# Patient Record
Sex: Male | Born: 1945 | Race: White | Hispanic: No | Marital: Married | Smoking: Never smoker
Health system: Southern US, Community
[De-identification: ages and names within clinical notes are randomized; demographics above are authoritative.]

## PROBLEM LIST (undated history)

## (undated) DIAGNOSIS — E785 Hyperlipidemia, unspecified: Secondary | ICD-10-CM

## (undated) DIAGNOSIS — N529 Male erectile dysfunction, unspecified: Secondary | ICD-10-CM

## (undated) DIAGNOSIS — E119 Type 2 diabetes mellitus without complications: Secondary | ICD-10-CM

## (undated) DIAGNOSIS — R296 Repeated falls: Secondary | ICD-10-CM

## (undated) DIAGNOSIS — I5189 Other ill-defined heart diseases: Secondary | ICD-10-CM

## (undated) DIAGNOSIS — M1990 Unspecified osteoarthritis, unspecified site: Secondary | ICD-10-CM

## (undated) DIAGNOSIS — Z972 Presence of dental prosthetic device (complete) (partial): Secondary | ICD-10-CM

## (undated) DIAGNOSIS — Z7982 Long term (current) use of aspirin: Secondary | ICD-10-CM

## (undated) DIAGNOSIS — M47816 Spondylosis without myelopathy or radiculopathy, lumbar region: Secondary | ICD-10-CM

## (undated) DIAGNOSIS — R202 Paresthesia of skin: Secondary | ICD-10-CM

## (undated) DIAGNOSIS — F419 Anxiety disorder, unspecified: Secondary | ICD-10-CM

## (undated) DIAGNOSIS — K219 Gastro-esophageal reflux disease without esophagitis: Secondary | ICD-10-CM

## (undated) DIAGNOSIS — Z860100 Personal history of colon polyps, unspecified: Secondary | ICD-10-CM

## (undated) DIAGNOSIS — I200 Unstable angina: Secondary | ICD-10-CM

## (undated) DIAGNOSIS — C61 Malignant neoplasm of prostate: Secondary | ICD-10-CM

## (undated) DIAGNOSIS — E1129 Type 2 diabetes mellitus with other diabetic kidney complication: Secondary | ICD-10-CM

## (undated) DIAGNOSIS — M5030 Other cervical disc degeneration, unspecified cervical region: Secondary | ICD-10-CM

## (undated) DIAGNOSIS — G8929 Other chronic pain: Secondary | ICD-10-CM

## (undated) DIAGNOSIS — E1140 Type 2 diabetes mellitus with diabetic neuropathy, unspecified: Secondary | ICD-10-CM

## (undated) DIAGNOSIS — E1142 Type 2 diabetes mellitus with diabetic polyneuropathy: Secondary | ICD-10-CM

## (undated) DIAGNOSIS — N401 Enlarged prostate with lower urinary tract symptoms: Secondary | ICD-10-CM

## (undated) DIAGNOSIS — M48062 Spinal stenosis, lumbar region with neurogenic claudication: Secondary | ICD-10-CM

## (undated) DIAGNOSIS — D509 Iron deficiency anemia, unspecified: Secondary | ICD-10-CM

## (undated) DIAGNOSIS — Z794 Long term (current) use of insulin: Secondary | ICD-10-CM

## (undated) DIAGNOSIS — G4700 Insomnia, unspecified: Secondary | ICD-10-CM

## (undated) DIAGNOSIS — Z973 Presence of spectacles and contact lenses: Secondary | ICD-10-CM

## (undated) DIAGNOSIS — M51369 Other intervertebral disc degeneration, lumbar region without mention of lumbar back pain or lower extremity pain: Secondary | ICD-10-CM

## (undated) DIAGNOSIS — Z77098 Contact with and (suspected) exposure to other hazardous, chiefly nonmedicinal, chemicals: Secondary | ICD-10-CM

## (undated) DIAGNOSIS — I2510 Atherosclerotic heart disease of native coronary artery without angina pectoris: Secondary | ICD-10-CM

## (undated) DIAGNOSIS — I10 Essential (primary) hypertension: Secondary | ICD-10-CM

## (undated) DIAGNOSIS — M5136 Other intervertebral disc degeneration, lumbar region: Secondary | ICD-10-CM

## (undated) HISTORY — DX: Other ill-defined heart diseases: I51.89

## (undated) HISTORY — PX: PROSTATE BIOPSY: SHX241

## (undated) HISTORY — PX: SHOULDER ARTHROSCOPY: SHX128

---

## 2008-04-22 ENCOUNTER — Ambulatory Visit: Payer: Self-pay | Admitting: Unknown Physician Specialty

## 2013-08-08 DIAGNOSIS — E1129 Type 2 diabetes mellitus with other diabetic kidney complication: Secondary | ICD-10-CM | POA: Insufficient documentation

## 2013-11-01 ENCOUNTER — Ambulatory Visit: Payer: Self-pay | Admitting: Unknown Physician Specialty

## 2013-11-03 LAB — PATHOLOGY REPORT

## 2014-03-13 DIAGNOSIS — I10 Essential (primary) hypertension: Secondary | ICD-10-CM | POA: Diagnosis not present

## 2014-03-13 DIAGNOSIS — E1129 Type 2 diabetes mellitus with other diabetic kidney complication: Secondary | ICD-10-CM | POA: Diagnosis not present

## 2014-03-13 DIAGNOSIS — E785 Hyperlipidemia, unspecified: Secondary | ICD-10-CM | POA: Diagnosis not present

## 2014-03-13 DIAGNOSIS — E538 Deficiency of other specified B group vitamins: Secondary | ICD-10-CM | POA: Diagnosis not present

## 2014-03-20 DIAGNOSIS — E1129 Type 2 diabetes mellitus with other diabetic kidney complication: Secondary | ICD-10-CM | POA: Diagnosis not present

## 2014-03-20 DIAGNOSIS — E782 Mixed hyperlipidemia: Secondary | ICD-10-CM | POA: Diagnosis not present

## 2014-03-20 DIAGNOSIS — I10 Essential (primary) hypertension: Secondary | ICD-10-CM | POA: Diagnosis not present

## 2014-09-18 DIAGNOSIS — E782 Mixed hyperlipidemia: Secondary | ICD-10-CM | POA: Diagnosis not present

## 2014-09-18 DIAGNOSIS — Z125 Encounter for screening for malignant neoplasm of prostate: Secondary | ICD-10-CM | POA: Diagnosis not present

## 2014-09-18 DIAGNOSIS — I10 Essential (primary) hypertension: Secondary | ICD-10-CM | POA: Diagnosis not present

## 2014-09-18 DIAGNOSIS — E1129 Type 2 diabetes mellitus with other diabetic kidney complication: Secondary | ICD-10-CM | POA: Diagnosis not present

## 2014-09-24 DIAGNOSIS — R809 Proteinuria, unspecified: Secondary | ICD-10-CM | POA: Diagnosis not present

## 2014-09-24 DIAGNOSIS — Z0000 Encounter for general adult medical examination without abnormal findings: Secondary | ICD-10-CM | POA: Diagnosis not present

## 2014-09-24 DIAGNOSIS — E1129 Type 2 diabetes mellitus with other diabetic kidney complication: Secondary | ICD-10-CM | POA: Diagnosis not present

## 2014-09-24 DIAGNOSIS — E782 Mixed hyperlipidemia: Secondary | ICD-10-CM | POA: Diagnosis not present

## 2014-10-02 ENCOUNTER — Ambulatory Visit: Payer: Self-pay | Admitting: *Deleted

## 2014-11-18 DIAGNOSIS — E1129 Type 2 diabetes mellitus with other diabetic kidney complication: Secondary | ICD-10-CM | POA: Diagnosis not present

## 2014-11-18 DIAGNOSIS — R809 Proteinuria, unspecified: Secondary | ICD-10-CM | POA: Diagnosis not present

## 2014-11-18 DIAGNOSIS — Z794 Long term (current) use of insulin: Secondary | ICD-10-CM | POA: Diagnosis not present

## 2014-11-25 DIAGNOSIS — R809 Proteinuria, unspecified: Secondary | ICD-10-CM | POA: Diagnosis not present

## 2014-11-25 DIAGNOSIS — I10 Essential (primary) hypertension: Secondary | ICD-10-CM | POA: Diagnosis not present

## 2014-11-25 DIAGNOSIS — E1129 Type 2 diabetes mellitus with other diabetic kidney complication: Secondary | ICD-10-CM | POA: Diagnosis not present

## 2014-11-25 DIAGNOSIS — M5430 Sciatica, unspecified side: Secondary | ICD-10-CM | POA: Diagnosis not present

## 2014-11-25 DIAGNOSIS — Z794 Long term (current) use of insulin: Secondary | ICD-10-CM | POA: Diagnosis not present

## 2015-03-13 DIAGNOSIS — F324 Major depressive disorder, single episode, in partial remission: Secondary | ICD-10-CM | POA: Diagnosis not present

## 2015-03-13 DIAGNOSIS — E1140 Type 2 diabetes mellitus with diabetic neuropathy, unspecified: Secondary | ICD-10-CM | POA: Insufficient documentation

## 2015-03-25 DIAGNOSIS — E119 Type 2 diabetes mellitus without complications: Secondary | ICD-10-CM | POA: Diagnosis not present

## 2015-04-01 DIAGNOSIS — H532 Diplopia: Secondary | ICD-10-CM | POA: Diagnosis not present

## 2015-04-09 DIAGNOSIS — H4921 Sixth [abducent] nerve palsy, right eye: Secondary | ICD-10-CM | POA: Diagnosis not present

## 2015-04-25 DIAGNOSIS — R809 Proteinuria, unspecified: Secondary | ICD-10-CM | POA: Diagnosis not present

## 2015-04-25 DIAGNOSIS — E1129 Type 2 diabetes mellitus with other diabetic kidney complication: Secondary | ICD-10-CM | POA: Diagnosis not present

## 2015-04-25 DIAGNOSIS — Z1159 Encounter for screening for other viral diseases: Secondary | ICD-10-CM | POA: Diagnosis not present

## 2015-04-25 DIAGNOSIS — Z794 Long term (current) use of insulin: Secondary | ICD-10-CM | POA: Diagnosis not present

## 2015-05-02 DIAGNOSIS — G5701 Lesion of sciatic nerve, right lower limb: Secondary | ICD-10-CM | POA: Diagnosis not present

## 2015-05-02 DIAGNOSIS — R809 Proteinuria, unspecified: Secondary | ICD-10-CM | POA: Diagnosis not present

## 2015-05-02 DIAGNOSIS — M47816 Spondylosis without myelopathy or radiculopathy, lumbar region: Secondary | ICD-10-CM | POA: Diagnosis not present

## 2015-05-02 DIAGNOSIS — Z794 Long term (current) use of insulin: Secondary | ICD-10-CM | POA: Diagnosis not present

## 2015-05-02 DIAGNOSIS — E782 Mixed hyperlipidemia: Secondary | ICD-10-CM | POA: Diagnosis not present

## 2015-05-02 DIAGNOSIS — E1129 Type 2 diabetes mellitus with other diabetic kidney complication: Secondary | ICD-10-CM | POA: Diagnosis not present

## 2015-11-25 DIAGNOSIS — R809 Proteinuria, unspecified: Secondary | ICD-10-CM | POA: Diagnosis not present

## 2015-11-25 DIAGNOSIS — Z125 Encounter for screening for malignant neoplasm of prostate: Secondary | ICD-10-CM | POA: Diagnosis not present

## 2015-11-25 DIAGNOSIS — E1129 Type 2 diabetes mellitus with other diabetic kidney complication: Secondary | ICD-10-CM | POA: Diagnosis not present

## 2015-11-25 DIAGNOSIS — Z794 Long term (current) use of insulin: Secondary | ICD-10-CM | POA: Diagnosis not present

## 2015-12-02 DIAGNOSIS — Z794 Long term (current) use of insulin: Secondary | ICD-10-CM | POA: Diagnosis not present

## 2015-12-02 DIAGNOSIS — E1121 Type 2 diabetes mellitus with diabetic nephropathy: Secondary | ICD-10-CM | POA: Diagnosis not present

## 2015-12-02 DIAGNOSIS — E1140 Type 2 diabetes mellitus with diabetic neuropathy, unspecified: Secondary | ICD-10-CM | POA: Diagnosis not present

## 2015-12-02 DIAGNOSIS — Z0000 Encounter for general adult medical examination without abnormal findings: Secondary | ICD-10-CM | POA: Diagnosis not present

## 2015-12-02 DIAGNOSIS — I10 Essential (primary) hypertension: Secondary | ICD-10-CM | POA: Insufficient documentation

## 2018-02-21 DIAGNOSIS — C61 Malignant neoplasm of prostate: Secondary | ICD-10-CM

## 2018-02-21 HISTORY — DX: Malignant neoplasm of prostate: C61

## 2018-03-08 ENCOUNTER — Encounter: Payer: Self-pay | Admitting: *Deleted

## 2018-04-06 ENCOUNTER — Encounter: Payer: Self-pay | Admitting: Radiation Oncology

## 2018-04-06 NOTE — Progress Notes (Addendum)
GU Location of Tumor / Histology: prostatic adenocarcinoma   If Prostate Cancer, Gleason Score is (3 + 4) and PSA is (7.52). Prostate volume: 46 cc.   NESHAWN AIRD was found to have a PSA that was slightly elevated in 01/2017 at 4.63. When repeated on 11/24/2017 it had risen to 7.52. Patient presented to Dr. Karsten Ro for further evaluation of an elevated PSA 01/2018.  Biopsies of prostate (if applicable) revealed:    Past/Anticipated interventions by urology, if any: prostate biopsy, referral to radiation oncology for consideration of radioactive seeds  Past/Anticipated interventions by medical oncology, if any: no  Weight changes, if any: no  Bowel/Bladder complaints, if any: IPSS 17. SHIM 7.  Reports nocturia x 2.  Denies dysuria or hematuria. Reports occasional leakage related to urinary urgency.   Nausea/Vomiting, if any: no  Pain issues, if any:  Low back that radiates down right leg. Hx of degenerative disc disease.   SAFETY ISSUES:  Prior radiation? no  Pacemaker/ICD? no  Possible current pregnancy? no, male patient  Is the patient on methotrexate? no  Current Complaints / other details:  73 year old male. Married. NKDA. NO family hx of prostate ca. Reports being a [place] vet and exposed to agent orange.

## 2018-04-07 ENCOUNTER — Encounter: Payer: Self-pay | Admitting: Radiation Oncology

## 2018-04-07 ENCOUNTER — Encounter: Payer: Self-pay | Admitting: Medical Oncology

## 2018-04-07 ENCOUNTER — Ambulatory Visit
Admission: RE | Admit: 2018-04-07 | Discharge: 2018-04-07 | Disposition: A | Discharge status: Home / Self Care | Payer: Non-veteran care | Source: Ambulatory Visit | Attending: Radiation Oncology | Admitting: Radiation Oncology

## 2018-04-07 ENCOUNTER — Other Ambulatory Visit: Payer: Self-pay

## 2018-04-07 VITALS — BP 142/83 | HR 89 | Temp 97.6°F | Resp 20 | Wt 215.2 lb

## 2018-04-07 DIAGNOSIS — C61 Malignant neoplasm of prostate: Secondary | ICD-10-CM

## 2018-04-07 DIAGNOSIS — Z79899 Other long term (current) drug therapy: Secondary | ICD-10-CM | POA: Insufficient documentation

## 2018-04-07 DIAGNOSIS — Z7982 Long term (current) use of aspirin: Secondary | ICD-10-CM | POA: Insufficient documentation

## 2018-04-07 DIAGNOSIS — Z86010 Personal history of colonic polyps: Secondary | ICD-10-CM | POA: Insufficient documentation

## 2018-04-07 DIAGNOSIS — K219 Gastro-esophageal reflux disease without esophagitis: Secondary | ICD-10-CM | POA: Insufficient documentation

## 2018-04-07 HISTORY — DX: Malignant neoplasm of prostate: C61

## 2018-04-07 NOTE — Progress Notes (Signed)
Radiation Oncology         [phone] ________________________________  Initial outpatient Consultation  Name: Cristian Martin MRN: [id number]  Date: 04/07/2018  DOB: April 24, 1945  PF:[id number], Reece Leader, MD  Kathie Rhodes, MD   REFERRING PHYSICIAN: Kathie Rhodes, MD  DIAGNOSIS: 73 y.o. gentleman with Stage T1c adenocarcinoma of the prostate with Gleason score of 3+4, and PSA of 7.52.    ICD-10-CM   1. Malignant neoplasm of prostate [facility]) C61 Ambulatory referral to Social Work    HISTORY OF PRESENT ILLNESS: Cristian Martin is a 73 y.o. male with a diagnosis of prostate cancer. He was noted to have an elevated PSA of 7.52 by his primary care physician at the [place].  Accordingly, he was referred for evaluation in urology by Dr. Karsten Ro on 01/31/2018,  digital rectal examination was performed at that time revealing no abnormalities.  The patient proceeded to transrectal ultrasound with 12 biopsies of the prostate on 02/21/2018.  The prostate volume measured 46 cc.  Out of 12 core biopsies, 3 were positive.  The maximum Gleason score was 3+4, and this was seen in left mid lateral. Gleason 3+3 was also seen in left base lateral and right apex lateral.   The patient reviewed the biopsy results with his urologist and he has kindly been referred today for discussion of potential radiation treatment options.  Of note, this is a [place] veteran with a history of agent orange exposure.   PREVIOUS RADIATION THERAPY: No  PAST MEDICAL HISTORY:  Past Medical History:  Diagnosis Date  . Prostate cancer ([place])       PAST SURGICAL HISTORY: Past Surgical History:  Procedure Laterality Date  . PROSTATE BIOPSY      FAMILY HISTORY:  Family History  Problem Relation Age of Onset  . Cancer Neg Hx     SOCIAL HISTORY:  Social History   Socioeconomic History  . Marital status: Married    Spouse name: Not on file  . Number of children: Not on file  . Years of education: Not on file  . Highest education  level: Not on file  Occupational History  . Not on file  Social Needs  . Financial resource strain: Not on file  . Food insecurity:    Worry: Not on file    Inability: Not on file  . Transportation needs:    Medical: Not on file    Non-medical: Not on file  Tobacco Use  . Smoking status: Never Smoker  . Smokeless tobacco: Never Used  Substance and Sexual Activity  . Alcohol use: Not Currently  . Drug use: Never  . Sexual activity: Not Currently  Lifestyle  . Physical activity:    Days per week: Not on file    Minutes per session: Not on file  . Stress: Not on file  Relationships  . Social connections:    Talks on phone: Not on file    Gets together: Not on file    Attends religious service: Not on file    Active member of club or organization: Not on file    Attends meetings of clubs or organizations: Not on file    Relationship status: Not on file  . Intimate partner violence:    Fear of current or ex partner: Not on file    Emotionally abused: Not on file    Physically abused: Not on file    Forced sexual activity: Not on file  Other Topics Concern  . Not on file  Social History Narrative  . Not on file    ALLERGIES: Ace inhibitors  MEDICATIONS:  Current Outpatient Medications  Medication Sig Dispense Refill  . amLODipine (NORVASC) 10 MG tablet Take 10 mg by mouth daily.    Marland Kitchen aspirin EC 81 MG tablet Take 81 mg by mouth daily.    . cloNIDine (CATAPRES - DOSED IN MG/24 HR) 0.3 mg/24hr patch Place 0.3 mg onto the skin once a week.    . cloNIDine (CATAPRES) 0.3 MG tablet TAKE 1 TABLET BY MOUTH THREE TIMES DAILY    . doxazosin (CARDURA) 4 MG tablet Take 4 mg by mouth daily.    . DULoxetine (CYMBALTA) 60 MG capsule Take 60 mg by mouth daily.    . Insulin Aspart Prot & Aspart (NOVOLOG MIX 70/30 FLEXPEN [place]) Inject into the skin.    Marland Kitchen insulin detemir (LEVEMIR) 100 UNIT/ML injection Inject into the skin.    Marland Kitchen losartan (COZAAR) 100 MG tablet Take 100 mg by mouth daily.     . metFORMIN (GLUCOPHAGE) 500 MG tablet Take by mouth 2 (two) times daily with a meal.    . Multiple Vitamins-Minerals (MULTIVITAMIN ADULT PO) Take by mouth.    Marland Kitchen omeprazole (PRILOSEC) 20 MG capsule Take 20 mg by mouth daily.    . rosuvastatin (CRESTOR) 20 MG tablet Take 20 mg by mouth daily.    . Semaglutide (OZEMPIC, 1 MG/DOSE, [place]) Inject into the skin.    Marland Kitchen simvastatin (ZOCOR) 40 MG tablet TAKE 1/2 TABLET BY MOUTH NIGHTLY     No current facility-administered medications for this encounter.     REVIEW OF SYSTEMS:  On review of systems, the patient reports that he is doing well overall. He denies any chest pain, shortness of breath, cough, fevers, chills, night sweats, unintended weight changes. He denies any bowel disturbances, and denies abdominal pain, nausea or vomiting. He reports low back pain that radiates down his right leg, with a history of degenerative disc disease. His IPSS was 17, indicating moderate urinary symptoms. He reports nocturia x2 and occasional leakage related to urinary urgency. He denies dysuria or hematuria. His SHIM was 7, indicating he has severe erectile dysfunction. A complete review of systems is obtained and is otherwise negative.    PHYSICAL EXAM:  Wt Readings from Last 3 Encounters:  04/07/18 215 lb 3.2 oz (97.6 kg)   Temp Readings from Last 3 Encounters:  04/07/18 97.6 F (36.4 C) (Oral)   BP Readings from Last 3 Encounters:  04/07/18 (!) 142/83   Pulse Readings from Last 3 Encounters:  04/07/18 89   Pain Assessment Pain Score: 0-No pain/10  In general this is a well appearing Caucasian gentleman in no acute distress. He is alert and oriented x4 and appropriate throughout the examination. HEENT reveals that the patient is normocephalic, atraumatic.   KPS = 90  100 - Normal; no complaints; no evidence of disease. 90   - Able to carry on normal activity; minor signs or symptoms of disease. 80   - Normal activity with effort; some signs or  symptoms of disease. 18   - Cares for self; unable to carry on normal activity or to do active work. 60   - Requires occasional assistance, but is able to care for most of his personal needs. 50   - Requires considerable assistance and frequent medical care. 35   - Disabled; requires special care and assistance. 50   - Severely disabled; hospital admission is indicated although death not imminent. 20   -  Very sick; hospital admission necessary; active supportive treatment necessary. 10   - Moribund; fatal processes progressing rapidly. 0     - Dead  Karnofsky DA, Abelmann WH, Craver LS and Burchenal JH [phone]) The use of the nitrogen mustards in the palliative treatment of carcinoma: with particular reference to bronchogenic carcinoma Cancer 1 634-56  LABORATORY DATA:  No results found for: WBC, HGB, HCT, MCV, PLT No results found for: NA, K, CL, CO2 No results found for: ALT, AST, GGT, ALKPHOS, BILITOT   RADIOGRAPHY: No results found.    IMPRESSION/PLAN: 1. 73 y.o. gentleman with Stage T1c adenocarcinoma of the prostate with Gleason Score of 3+4, and PSA of 7.52. We discussed the patient's workup and outlines the nature of prostate cancer in this setting. The patient's T stage, Gleason's score, and PSA put him into the favorable intermediate risk group. Accordingly, he is eligible for a variety of potential treatment options including brachytherapy or 5.5 weeks of external radiation. We discussed the available radiation techniques, and he focused most of his questions on brachytherapy. We discussed and outlined the risks, benefits, short and long-term effects associated with radiotherapy.   At the end of the conversation the patient is interested in moving forward with brachytherapy.  We will share our discussion with Dr. Karsten Ro and move forward with scheduling his [facility] planning appointment in the near future.  We will have our scheduler, Romie Jumper, call him in the near future to  coordinate OR scheduling and pre and post procedure appointments.   We spent 60 minutes face to face with the patient and more than 50% of that time was spent in counseling and/or coordination of care.  ------------------------------------------------   Tyler Pita, MD [place] Director and Director of Stereotactic Radiosurgery Direct Dial: [phone]  Fax: [phone] [email or web address]  Skype  [email or web address]   This document serves as a record of services personally performed by Tyler Pita, MD. It was created on his behalf by Wilburn Mylar, a trained medical scribe. The creation of this record is based on the scribe's personal observations and the provider's statements to them. This document has been checked and approved by the attending provider.

## 2018-04-07 NOTE — Progress Notes (Signed)
Introduced myself to Cristian Martin and his wife as the nurse navigator and my role. He is interested in brachytherapy to treat prostate cancer.We discussed the planning and procedure. Enid Derry is out of the office today but I informed them that she will give them a call next week to being the process. I asked them to call me with questions or concerns. He voiced understanding.

## 2018-04-07 NOTE — Progress Notes (Signed)
See progress note under physician encounter. 

## 2018-04-09 DIAGNOSIS — C61 Malignant neoplasm of prostate: Secondary | ICD-10-CM | POA: Insufficient documentation

## 2018-04-09 NOTE — Addendum Note (Signed)
Encounter addended by: Tyler Pita, MD on: 04/09/2018 5:14 PM  Actions taken: LOS modified, Visit Navigator Flowsheet section accepted, Actions taken from a BestPractice Advisory

## 2018-04-11 ENCOUNTER — Telehealth: Payer: Self-pay | Admitting: *Deleted

## 2018-04-11 NOTE — Telephone Encounter (Signed)
CALLED PATIENT TO ASK QUESTIONS, SPOKE WITH PATIENT 

## 2018-04-12 ENCOUNTER — Encounter: Payer: Self-pay | Admitting: General Practice

## 2018-04-12 NOTE — Progress Notes (Signed)
[facility] Psychosocial Distress Screening Clinical Social Work  Clinical Social Work was referred by distress screening protocol.  The patient scored a 9 on the Psychosocial Distress Thermometer which indicates severe distress. Clinical Social Worker contacted patient by phone to assess for distress and other psychosocial needs. Patient not home, spoke w wife of 51 years.  "I think he is just nervous about the surgery, he wants to get it over with."  Patient is worried that his back pain may be related to cancer found in prostate.  Is currently taking antidepressants prescribed by [facility] PCP.  Encouraged to share any concerns at next visit w PCP.  Discussed normal anxiety after cancer diagnosis and availability of support services at [facility], information packet mailed to family.    ONCBCN DISTRESS SCREENING 04/07/2018  Screening Type Initial Screening  Distress experienced in past week (1-10) 9  Emotional problem type Depression;Nervousness/Anxiety;Adjusting to illness  Spiritual/Religous concerns type Relating to God  Information Concerns Type Lack of info about treatment;Lack of info about complementary therapy choices  Physical Problem type Pain;Sleep/insomnia;Constipation/diarrhea;Changes in urination;Tingling hands/feet  Physician notified of physical symptoms Yes  Referral to clinical psychology No  Referral to clinical social work Yes  Referral to dietition No  Referral to financial advocate No  Referral to support programs No  Referral to palliative care No    Clinical Social Worker follow up needed: No.  If yes, follow up plan:  Beverely Pace, [place], LCSW Clinical Social Worker Phone:  [phone]

## 2018-04-14 ENCOUNTER — Telehealth: Payer: Self-pay | Admitting: *Deleted

## 2018-04-14 NOTE — Telephone Encounter (Signed)
Called patient to inform of pre-seed planning CT for 06-01-18, spoke with patient and he is aware of this appt.

## 2018-04-28 ENCOUNTER — Encounter: Payer: Self-pay | Admitting: *Deleted

## 2018-05-19 ENCOUNTER — Encounter: Payer: Self-pay | Admitting: Urology

## 2018-05-19 ENCOUNTER — Telehealth: Payer: Self-pay | Admitting: Radiation Oncology

## 2018-05-19 ENCOUNTER — Telehealth: Payer: Self-pay | Admitting: *Deleted

## 2018-05-19 NOTE — Telephone Encounter (Signed)
Returned patient's phone call, spoke with patient 

## 2018-05-19 NOTE — Telephone Encounter (Signed)
Received voicemail message from patient requesting return call. Patient questions if his appointment "will still happen on 4/30." Message routed to Connye Burkitt to confirm and review upcoming appointments with patient.

## 2018-05-19 NOTE — Progress Notes (Signed)
I spoke with the patient by phone regarding some recent scrotal swelling/discomfort that he has been having ongoing intermittently over the past 6+ months.  This was reported to our staff when they call him to reschedule his brachytherapy procedure.  He denies any erythema or drainage from the scrotum but reports that the testicles "periodically fill with fluid" and is quite uncomfortable.  He has been using Tylenol which does not provide much if any relief.  He denies any recent scrotal injury, fever or chills.  Based on his report, this sounds like it could be a hydrocele.  He does wear brief style underwear which I encouraged him to continue with.  In addition, I advised that he could use ice packs or a bag of frozen peas or corn wrapped in a dish towel as needed to help reduce swelling and discomfort.  I also advised him to use a rolled dish towel to elevate the scrotum when he is in bed at night and anytime he is laying down on the sofa to watch TV.  He will try switching to Motrin/Advil instead of Tylenol to see if this helps reduce the inflammation and better control his pain.  He will try these simple measures throughout the weekend and if his symptoms do not improve or certainly if they progress, he will call [facility] to schedule a follow-up appointment with Dr. Karsten Ro in the near future for further evaluation and treatment.  He states his understanding and compliance with these recommendations and is in agreement with the stated plan.  We will move forward with rescheduling his brachytherapy procedure, as well as the pre-and post seed appointments and will be back in touch with him in the near future.  He knows to call at anytime in the interim with any questions or concerns.  Nicholos Johns, MMS, PA-C [place] at [place]: [phone]  Fax: [phone]

## 2018-05-31 ENCOUNTER — Other Ambulatory Visit: Payer: Self-pay

## 2018-06-01 ENCOUNTER — Ambulatory Visit
Admission: RE | Admit: 2018-06-01 | Discharge: 2018-06-01 | Disposition: A | Discharge status: Home / Self Care | Payer: Non-veteran care | Source: Ambulatory Visit | Attending: Radiation Oncology | Admitting: Radiation Oncology

## 2018-06-01 ENCOUNTER — Ambulatory Visit: Payer: Self-pay | Admitting: Urology

## 2018-06-01 DIAGNOSIS — Z79899 Other long term (current) drug therapy: Secondary | ICD-10-CM | POA: Insufficient documentation

## 2018-06-01 DIAGNOSIS — Z7982 Long term (current) use of aspirin: Secondary | ICD-10-CM | POA: Insufficient documentation

## 2018-06-01 DIAGNOSIS — C61 Malignant neoplasm of prostate: Secondary | ICD-10-CM

## 2018-06-02 ENCOUNTER — Telehealth: Payer: Self-pay | Admitting: *Deleted

## 2018-06-02 NOTE — Telephone Encounter (Signed)
CALLED PATIENT TO INFORM OF PRE-SEED PLANNING FOR 06-09-18 , AND HIS IMPLANT ON 07-14-18, SPOKE WITH PATIENT AND HE IS AWARE OF THESE APPTS.

## 2018-06-05 ENCOUNTER — Other Ambulatory Visit: Payer: Self-pay | Admitting: Urology

## 2018-06-08 ENCOUNTER — Telehealth: Payer: Self-pay | Admitting: *Deleted

## 2018-06-08 ENCOUNTER — Other Ambulatory Visit: Payer: Self-pay | Admitting: Urology

## 2018-06-08 NOTE — Telephone Encounter (Signed)
CALLED PATIENT TO REMIND OF APPTS. FOR 06-09-18, SPOKE WITH PATIENT AND HE IS AWARE OF THESE APPTS.

## 2018-06-09 ENCOUNTER — Ambulatory Visit
Admission: RE | Admit: 2018-06-09 | Discharge: 2018-06-09 | Disposition: A | Discharge status: Home / Self Care | Payer: Non-veteran care | Source: Ambulatory Visit | Attending: Urology | Admitting: Urology

## 2018-06-09 ENCOUNTER — Other Ambulatory Visit: Payer: Self-pay

## 2018-06-09 ENCOUNTER — Ambulatory Visit (HOSPITAL_COMMUNITY)
Admission: RE | Admit: 2018-06-09 | Discharge: 2018-06-09 | Disposition: A | Discharge status: Home / Self Care | Payer: No Typology Code available for payment source | Source: Ambulatory Visit | Attending: Urology | Admitting: Urology

## 2018-06-09 ENCOUNTER — Encounter (HOSPITAL_COMMUNITY)
Admission: RE | Admit: 2018-06-09 | Discharge: 2018-06-09 | Disposition: A | Discharge status: Home / Self Care | Payer: No Typology Code available for payment source | Source: Ambulatory Visit | Attending: Urology | Admitting: Urology

## 2018-06-09 ENCOUNTER — Ambulatory Visit
Admission: RE | Admit: 2018-06-09 | Discharge: 2018-06-09 | Disposition: A | Discharge status: Home / Self Care | Payer: No Typology Code available for payment source | Source: Ambulatory Visit | Attending: Urology | Admitting: Urology

## 2018-06-09 VITALS — BP 163/77 | HR 80 | Temp 98.9°F | Resp 20 | Ht 70.0 in | Wt 211.0 lb

## 2018-06-09 DIAGNOSIS — C61 Malignant neoplasm of prostate: Secondary | ICD-10-CM

## 2018-06-09 DIAGNOSIS — Z79899 Other long term (current) drug therapy: Secondary | ICD-10-CM | POA: Diagnosis not present

## 2018-06-09 DIAGNOSIS — Z7982 Long term (current) use of aspirin: Secondary | ICD-10-CM | POA: Diagnosis not present

## 2018-06-09 NOTE — Progress Notes (Signed)
  Radiation Oncology         [phone] ________________________________  Name: Cristian Martin MRN: [id number]  Date: 06/09/2018  DOB: 1945/07/27  SIMULATION AND TREATMENT PLANNING NOTE PUBIC ARCH STUDY  UQ:[id number], Cristian Leader, MD  Kathie Rhodes, MD  DIAGNOSIS: 73 y.o. gentleman with Stage T1c adenocarcinoma of the prostate with Gleason score of 3+4, and PSA of 7.52.     ICD-10-CM   1. Malignant neoplasm of prostate ([place]) C61     COMPLEX SIMULATION:  The patient presented today for evaluation for possible prostate seed implant. He was brought to the radiation planning suite and placed supine on the CT couch. A 3-dimensional image study set was obtained in upload to the planning computer. There, on each axial slice, I contoured the prostate gland. Then, using three-dimensional radiation planning tools I reconstructed the prostate in view of the structures from the transperineal needle pathway to assess for possible pubic arch interference. In doing so, I did not appreciate any pubic arch interference. Also, the patient's prostate volume was estimated based on the drawn structure. The volume was 46 cc.  Given the pubic arch appearance and prostate volume, patient remains a good candidate to proceed with prostate seed implant. Today, he freely provided informed written consent to proceed.    PLAN: The patient will undergo prostate seed implant.   ________________________________  Sheral Apley. Tammi Klippel, M.D.   This document serves as a record of services personally performed by Tyler Pita, MD. It was created on his behalf by Wilburn Mylar, a trained medical scribe. The creation of this record is based on the scribe's personal observations and the provider's statements to them. This document has been checked and approved by the attending provider.

## 2018-06-13 ENCOUNTER — Other Ambulatory Visit: Payer: Self-pay | Admitting: Urology

## 2018-06-13 DIAGNOSIS — C61 Malignant neoplasm of prostate: Secondary | ICD-10-CM

## 2018-06-16 ENCOUNTER — Telehealth: Payer: Self-pay | Admitting: *Deleted

## 2018-06-16 ENCOUNTER — Encounter: Payer: Self-pay | Admitting: Radiation Oncology

## 2018-06-16 NOTE — Telephone Encounter (Signed)
CALLED PATIENT TO INFORM THAT I AM MAILING HIS LETTER, PATIENT VERIFIED UNDERSTANDING THIS

## 2018-06-28 ENCOUNTER — Other Ambulatory Visit: Payer: Self-pay | Admitting: *Deleted

## 2018-06-28 NOTE — Patient Outreach (Signed)
[facility]) Care Management [facility] CM screening call- insurance referral 06/28/2018  Cristian Martin 10-Mar-1945 [id number]  Follow up from [facility] HRA screening phone call placed to patient 04/28/2018.  Male person answering phone stated that she is patient's wife and that patient was not available; added that she and patient do not share information over the phone; explained to wife that call was placed as a benefit/ referral of patient's insurance company.  Wife stated that she prefers no more calls be placed to she or patient  Patient to be followed in HRA engaged program. Welcome letter mailed to patient 04/28/2018  Oneta Rack, RN, BSN, [place] Care Management  [phone]

## 2018-07-04 ENCOUNTER — Telehealth: Payer: Self-pay | Admitting: *Deleted

## 2018-07-04 NOTE — Telephone Encounter (Signed)
CALLED PATIENT TO INFORM THAT LAB WILL BE DONE ON 07-11-18 - ARRIVAL TIME- 12:30 PM @ [facility] ADMITTING, SPOKE WITH PATIENT'S WIFE- JUDITH AND SHE IS AWARE OF THIS APPT.

## 2018-07-11 ENCOUNTER — Other Ambulatory Visit: Payer: Self-pay

## 2018-07-11 ENCOUNTER — Other Ambulatory Visit (HOSPITAL_COMMUNITY)
Admission: RE | Admit: 2018-07-11 | Discharge: 2018-07-11 | Disposition: A | Discharge status: Home / Self Care | Payer: No Typology Code available for payment source | Source: Ambulatory Visit | Attending: Urology | Admitting: Urology

## 2018-07-11 ENCOUNTER — Encounter (HOSPITAL_COMMUNITY)
Admission: RE | Admit: 2018-07-11 | Discharge: 2018-07-11 | Disposition: A | Discharge status: Home / Self Care | Payer: No Typology Code available for payment source | Source: Ambulatory Visit | Attending: Urology | Admitting: Urology

## 2018-07-11 DIAGNOSIS — Z79899 Other long term (current) drug therapy: Secondary | ICD-10-CM | POA: Diagnosis not present

## 2018-07-11 DIAGNOSIS — Z01812 Encounter for preprocedural laboratory examination: Secondary | ICD-10-CM | POA: Diagnosis present

## 2018-07-11 DIAGNOSIS — N401 Enlarged prostate with lower urinary tract symptoms: Secondary | ICD-10-CM | POA: Diagnosis not present

## 2018-07-11 DIAGNOSIS — F419 Anxiety disorder, unspecified: Secondary | ICD-10-CM | POA: Diagnosis not present

## 2018-07-11 DIAGNOSIS — Z1159 Encounter for screening for other viral diseases: Secondary | ICD-10-CM | POA: Diagnosis not present

## 2018-07-11 DIAGNOSIS — Z794 Long term (current) use of insulin: Secondary | ICD-10-CM | POA: Diagnosis not present

## 2018-07-11 DIAGNOSIS — E7800 Pure hypercholesterolemia, unspecified: Secondary | ICD-10-CM | POA: Diagnosis not present

## 2018-07-11 DIAGNOSIS — E119 Type 2 diabetes mellitus without complications: Secondary | ICD-10-CM | POA: Diagnosis not present

## 2018-07-11 DIAGNOSIS — I10 Essential (primary) hypertension: Secondary | ICD-10-CM | POA: Diagnosis not present

## 2018-07-11 DIAGNOSIS — Z7982 Long term (current) use of aspirin: Secondary | ICD-10-CM | POA: Diagnosis not present

## 2018-07-11 DIAGNOSIS — F329 Major depressive disorder, single episode, unspecified: Secondary | ICD-10-CM | POA: Diagnosis not present

## 2018-07-11 DIAGNOSIS — C61 Malignant neoplasm of prostate: Secondary | ICD-10-CM | POA: Diagnosis not present

## 2018-07-11 DIAGNOSIS — K219 Gastro-esophageal reflux disease without esophagitis: Secondary | ICD-10-CM | POA: Diagnosis not present

## 2018-07-11 LAB — COMPREHENSIVE METABOLIC PANEL
ALT: 27 U/L (ref 0–44)
AST: 26 U/L (ref 15–41)
Albumin: 4.6 g/dL (ref 3.5–5.0)
Alkaline Phosphatase: 56 U/L (ref 38–126)
Anion gap: 8 (ref 5–15)
BUN: 25 mg/dL — ABNORMAL HIGH (ref 8–23)
CO2: 24 mmol/L (ref 22–32)
Calcium: 9.9 mg/dL (ref 8.9–10.3)
Chloride: 108 mmol/L (ref 98–111)
Creatinine, Ser: 0.96 mg/dL (ref 0.61–1.24)
GFR calc Af Amer: >60 mL/min (ref >60)
GFR calc non Af Amer: >60 mL/min (ref >60)
Glucose, Bld: 112 mg/dL — ABNORMAL HIGH (ref 70–99)
Potassium: 4.7 mmol/L (ref 3.5–5.1)
Sodium: 140 mmol/L (ref 135–145)
Total Bilirubin: 0.8 mg/dL (ref 0.3–1.2)
Total Protein: 7.7 g/dL (ref 6.5–8.1)

## 2018-07-11 LAB — CBC
HCT: 42.1 % (ref 39.0–52.0)
Hemoglobin: 13.3 g/dL (ref 13.0–17.0)
MCH: 28.2 pg (ref 26.0–34.0)
MCHC: 31.6 g/dL (ref 30.0–36.0)
MCV: 89.2 fL (ref 80.0–100.0)
Platelets: 241 10*3/uL (ref 150–400)
RBC: 4.72 MIL/uL (ref 4.22–5.81)
RDW: 14.9 % (ref 11.5–15.5)
WBC: 9.8 10*3/uL (ref 4.0–10.5)
nRBC: 0 % (ref 0.0–0.2)

## 2018-07-11 LAB — PROTIME-INR
INR: 1 (ref 0.8–1.2)
Prothrombin Time: 12.7 seconds (ref 11.4–15.2)

## 2018-07-11 LAB — APTT: aPTT: 23 seconds — ABNORMAL LOW (ref 24–36)

## 2018-07-12 ENCOUNTER — Encounter (HOSPITAL_BASED_OUTPATIENT_CLINIC_OR_DEPARTMENT_OTHER): Payer: Self-pay | Admitting: *Deleted

## 2018-07-12 ENCOUNTER — Other Ambulatory Visit: Payer: Self-pay

## 2018-07-12 LAB — NOVEL CORONAVIRUS, NAA (HOSP ORDER, SEND-OUT TO REF LAB; TAT 18-24 HRS): SARS-CoV-2, NAA: NOT DETECTED

## 2018-07-12 NOTE — H&P (Signed)
CC/HPI: Prostate cancer: PSA 10/19 - 7.52. No abnormality on DRE.  TRUS/Bx 02/21/2018: Prostate volume - 46 cc  Pathology: Adenocarcinoma 3+4 in 1 core and 3+3 in 2 cores (3/12 cores positive)  Stage: T1c     ALLERGIES: No Known Drug Allergies    MEDICATIONS: Crestor 20 mg tablet 0.5 tablet PO Daily  Metformin Hcl 500 mg tablet  Omeprazole 20 mg capsule,delayed release  Amlodipine Besylate 10 mg tablet  Aspirin Ec 81 mg tablet, delayed release  Clonidine Hcl 0.3 mg tablet  Doxazosin Mesylate 4 mg tablet 0.5 tablet PO Daily  Duloxetine Hcl 60 mg capsule,delayed release  Levaquin 750 mg tablet 1 tablet PO Daily Take the morning of your prostate biopsy.  Losartan Potassium 100 mg tablet  Multivitamin  Novolog Mix 70-30 Flexpen 100 unit/ml (70-30) insulin pen  Ozempic 0.25 mg or 0.5 mg dose (2 mg/1.5 ml) pen injector     GU PSH: Prostate Needle Biopsy - 02/21/2018    NON-GU PSH: Shoulder Arthroscopy/surgery Surgical Pathology, Gross And Microscopic Examination For Prostate Needle - 02/21/2018    GU PMH: BPH w/LUTS, He does have BPH but does not have any obstructive symptoms he does have - 01/31/2018 Elevated PSA, After discussing the options he has elected to proceed with a prostate biopsy and will stop his daily aspirin until after the procedure. - 01/31/2018 Nocturia, He has nocturia x2. He may need an alpha-blocker. He - 01/31/2018    NON-GU PMH: Anxiety Depression Diabetes Type 2 Hypercholesterolemia Hypertension    FAMILY HISTORY: Diabetes - Father   SOCIAL HISTORY: Marital Status: Married Preferred Language: English; Ethnicity: Not Hispanic Or Latino; Race: White Current Smoking Status: Patient has never smoked.   Tobacco Use Assessment Completed: Used Tobacco in last 30 days? Does not use smokeless tobacco. Does not drink anymore.  Does not use drugs. Does not drink caffeine. Has not had a blood transfusion.    REVIEW OF SYSTEMS:    GU Review Male:    Patient denies frequent urination, hard to postpone urination, burning/ pain with urination, get up at night to urinate, leakage of urine, stream starts and stops, trouble starting your stream, have to strain to urinate , erection problems, and penile pain.  Gastrointestinal (Upper):   Patient denies vomiting, indigestion/ heartburn, and nausea.  Gastrointestinal (Lower):   Patient denies diarrhea and constipation.  Constitutional:   Patient denies fever, night sweats, weight loss, and fatigue.  Skin:   Patient denies skin rash/ lesion and itching.  Eyes:   Patient denies blurred vision and double vision.  Ears/ Nose/ Throat:   Patient denies sore throat and sinus problems.  Hematologic/Lymphatic:   Patient denies swollen glands and easy bruising.  Cardiovascular:   Patient denies leg swelling and chest pains.  Respiratory:   Patient denies cough and shortness of breath.  Endocrine:   Patient denies excessive thirst.  Musculoskeletal:   Patient denies back pain and joint pain.  Neurological:   Patient denies headaches and dizziness.  Psychologic:   Patient denies depression and anxiety.   VITAL SIGNS:    Weight 210 lb / 95.25 kg  Height 69 in / 175.26 cm  BP 127/71 mmHg  Pulse 83 /min  BMI 31.0 kg/m    GU PHYSICAL EXAMINATION:    Anus and Perineum: No hemorrhoids. No anal stenosis. No rectal fissure, no anal fissure. No edema, no dimple, no perineal tenderness, no anal tenderness.  Scrotum: No lesions. No edema. No cysts. No warts.  Epididymides: Right: no  spermatocele, no masses, no cysts, no tenderness, no induration, no enlargement. Left: no spermatocele, no masses, no cysts, no tenderness, no induration, no enlargement.  Testes: No tenderness, no swelling, no enlargement left testes. No tenderness, no swelling, no enlargement right testes. Normal location left testes. Normal location right testes. No mass, no cyst, no varicocele, no hydrocele left testes. No mass, no cyst, no  varicocele, no hydrocele right testes.  Urethral Meatus: Normal size. No lesion, no wart, no discharge, no polyp. Normal location.  Penis: Circumcised, no warts, no cracks. No dorsal Peyronie's plaques, no left corporal Peyronie's plaques, no right corporal Peyronie's plaques, no scarring, no warts. No balanitis, no meatal stenosis.  Prostate: Prostate 2 1/2+ size. Left lobe normal consistency, right lobe normal consistency. Symmetrical lobes. No prostate nodule. Left lobe no tenderness, right lobe no tenderness.   Seminal Vesicles: Nonpalpable.  Sphincter Tone: Normal sphincter. No rectal tenderness. No rectal mass.    MULTI-SYSTEM PHYSICAL EXAMINATION:    Constitutional: Well-nourished. No physical deformities. Normally developed. Good grooming.  Neck: Neck symmetrical, not swollen. Normal tracheal position.  Respiratory: No labored breathing, no use of accessory muscles.   Cardiovascular: Normal temperature, normal extremity pulses, no swelling, no varicosities.  Lymphatic: No enlargement of neck, axillae, groin.  Skin: No paleness, no jaundice, no cyanosis. No lesion, no ulcer, no rash.  Neurologic / Psychiatric: Oriented to time, oriented to place, oriented to person. No depression, no anxiety, no agitation.  Gastrointestinal: No mass, no tenderness, no rigidity, non obese abdomen.  Eyes: Normal conjunctivae. Normal eyelids.  Ears, Nose, Mouth, and Throat: Left ear no scars, no lesions, no masses. Right ear no scars, no lesions, no masses. Nose no scars, no lesions, no masses. Normal hearing. Normal lips.  Musculoskeletal: Normal gait and station of head and neck.     PAST DATA REVIEWED:  Source Of History:  Patient   11/24/17 01/20/17 08/15/15  PSA  Total PSA 7.52 ng/dl 4.63 ng/dl 3.79 ng/dl    PROCEDURES:          Urinalysis w/Scope Dipstick Dipstick Cont'd Micro  Color: Yellow Bilirubin: Neg mg/dL WBC/hpf: NS (Not Seen)  Appearance: Clear Ketones: Neg mg/dL RBC/hpf: 10 -  20/hpf  Specific Gravity: 1.025 Blood: 3+ ery/uL Bacteria: NS (Not Seen)  pH: <=5.0 Protein: 1+ mg/dL Cystals: NS (Not Seen)  Glucose: 3+ mg/dL Urobilinogen: 0.2 mg/dL Casts: NS (Not Seen)    Nitrites: Neg Trichomonas: Not Present    Leukocyte Esterase: Neg leu/uL Mucous: Present      Epithelial Cells: NS (Not Seen)      Yeast: NS (Not Seen)      Sperm: Not Present    ASSESSMENT/PLAN:      ICD-10 Details  1 GU:   Prostate Cancer - I went over his pathology report with him as well as the significance of his Gleason score, number and location of cores positive and percent of cores positive. He has favorable, intermediate risk disease. We then discussed his Part[place] table results in detail and the significance of these predictions as far as prognosis and need for further workup are concerned. I then discussed with him the various options available including active surveillance and treatment for cure such as radiation and surgery. We briefly discussed the forms of radiation available.  He told me that he did not want to consider any form of active surveillance and wanted to proceed with treatment. His prostate is of a size that he would be a good candidate for radioactive seed  implant and presents today for his procedure.

## 2018-07-12 NOTE — Progress Notes (Addendum)
Spoke w/ pt via phone for pre-op interview.  Npo after mn.  Arrive at [facility].  Lab work (cbc, cmp, pt/inr, ptt) and covid test done 07-11-2018 , results in epic.  Current ekg and cxr in chart and epic. Pt verbalized understanding to do fleet enema am dos.  Pt to call back what medication's to take and not take morning of surgery.  ADDENDUM:  Spoke w/ via phone.  Pt verbalized understanding npo after mn w/ exeption sips of water w/ prilosec, norvasc, clonodin, and cymbalta.  And no diabetic medication morning of surgery.

## 2018-07-12 NOTE — Progress Notes (Signed)
SPOKE W/  _ pt via [place] 19:   COUGH-- no  RUNNY NOSE--- no  SORE THROAT--- no  NASAL CONGESTION---- no  SNEEZING---- no  SHORTNESS OF BREATH--- no  DIFFICULTY BREATHING--- no  TEMP >100.0 ----- no  UNEXPLAINED BODY ACHES------ no  CHILLS --------  no  HEADACHES --------- no  LOSS OF SMELL/ TASTE -------- no    HAVE YOU OR ANY FAMILY MEMBER TRAVELLED PAST 14 DAYS OUT OF THE   COUNTY--- no STATE---- no COUNTRY---- no  HAVE YOU OR ANY FAMILY MEMBER BEEN EXPOSED TO ANYONE WITH COVID 19?   denies

## 2018-07-13 ENCOUNTER — Telehealth: Payer: Self-pay | Admitting: *Deleted

## 2018-07-13 NOTE — Progress Notes (Signed)
SPOKE W/  _  [place] 19:   COUGH-- no  RUNNY NOSE--- no  SORE THROAT--- no  NASAL CONGESTION---- no SNEEZING----no  SHORTNESS OF BREATH--- no  DIFFICULTY BREATHING--- no  TEMP >100.0 ----- no  UNEXPLAINED BODY ACHES------no  CHILLS -------- no  HEADACHES ---------no  LOSS OF SMELL/ TASTE --------no    HAVE YOU OR ANY FAMILY MEMBER TRAVELLED PAST 14 DAYS OUT OF THE   COUNTY--- no STATE----no COUNTRY----  HAVE YOU OR ANY FAMILY MEMBER BEEN EXPOSED TO ANYONE WITH COVID 19? no

## 2018-07-13 NOTE — Telephone Encounter (Signed)
CALLED PATIENT TO REMIND OF IMPLANT FOR 07-14-18, SPOKE WITH PATIENT AND HE IS AWARE OF THIS PROCEDURE

## 2018-07-14 ENCOUNTER — Ambulatory Visit (HOSPITAL_BASED_OUTPATIENT_CLINIC_OR_DEPARTMENT_OTHER)
Admission: RE | Admit: 2018-07-14 | Discharge: 2018-07-14 | Disposition: A | Discharge status: Home / Self Care | Payer: No Typology Code available for payment source | Attending: Urology | Admitting: Urology

## 2018-07-14 ENCOUNTER — Ambulatory Visit (HOSPITAL_BASED_OUTPATIENT_CLINIC_OR_DEPARTMENT_OTHER): Payer: No Typology Code available for payment source | Admitting: Physician Assistant

## 2018-07-14 ENCOUNTER — Encounter (HOSPITAL_BASED_OUTPATIENT_CLINIC_OR_DEPARTMENT_OTHER): Payer: Self-pay | Admitting: Anesthesiology

## 2018-07-14 ENCOUNTER — Ambulatory Visit (HOSPITAL_COMMUNITY): Payer: No Typology Code available for payment source

## 2018-07-14 ENCOUNTER — Ambulatory Visit (HOSPITAL_BASED_OUTPATIENT_CLINIC_OR_DEPARTMENT_OTHER): Payer: No Typology Code available for payment source | Admitting: Anesthesiology

## 2018-07-14 ENCOUNTER — Encounter (HOSPITAL_BASED_OUTPATIENT_CLINIC_OR_DEPARTMENT_OTHER)
Admission: RE | Disposition: A | Discharge status: Home / Self Care | Payer: Self-pay | Source: Home / Self Care | Attending: Urology

## 2018-07-14 DIAGNOSIS — Z7982 Long term (current) use of aspirin: Secondary | ICD-10-CM | POA: Insufficient documentation

## 2018-07-14 DIAGNOSIS — Z794 Long term (current) use of insulin: Secondary | ICD-10-CM | POA: Insufficient documentation

## 2018-07-14 DIAGNOSIS — F419 Anxiety disorder, unspecified: Secondary | ICD-10-CM | POA: Diagnosis not present

## 2018-07-14 DIAGNOSIS — K219 Gastro-esophageal reflux disease without esophagitis: Secondary | ICD-10-CM | POA: Insufficient documentation

## 2018-07-14 DIAGNOSIS — C61 Malignant neoplasm of prostate: Secondary | ICD-10-CM | POA: Diagnosis not present

## 2018-07-14 DIAGNOSIS — E119 Type 2 diabetes mellitus without complications: Secondary | ICD-10-CM | POA: Insufficient documentation

## 2018-07-14 DIAGNOSIS — I10 Essential (primary) hypertension: Secondary | ICD-10-CM | POA: Insufficient documentation

## 2018-07-14 DIAGNOSIS — F329 Major depressive disorder, single episode, unspecified: Secondary | ICD-10-CM | POA: Diagnosis not present

## 2018-07-14 DIAGNOSIS — E7800 Pure hypercholesterolemia, unspecified: Secondary | ICD-10-CM | POA: Insufficient documentation

## 2018-07-14 DIAGNOSIS — N401 Enlarged prostate with lower urinary tract symptoms: Secondary | ICD-10-CM | POA: Insufficient documentation

## 2018-07-14 DIAGNOSIS — Z79899 Other long term (current) drug therapy: Secondary | ICD-10-CM | POA: Insufficient documentation

## 2018-07-14 HISTORY — PX: RADIOACTIVE SEED IMPLANT: SHX5150

## 2018-07-14 HISTORY — PX: CYSTOSCOPY: SHX5120

## 2018-07-14 HISTORY — DX: Essential (primary) hypertension: I10

## 2018-07-14 HISTORY — DX: Presence of spectacles and contact lenses: Z97.3

## 2018-07-14 HISTORY — DX: Gastro-esophageal reflux disease without esophagitis: K21.9

## 2018-07-14 HISTORY — DX: Long term (current) use of insulin: Z79.4

## 2018-07-14 HISTORY — PX: SPACE OAR INSTILLATION: SHX6769

## 2018-07-14 HISTORY — DX: Presence of dental prosthetic device (complete) (partial): Z97.2

## 2018-07-14 HISTORY — DX: Contact with and (suspected) exposure to other hazardous, chiefly nonmedicinal, chemicals: Z77.098

## 2018-07-14 HISTORY — DX: Benign prostatic hyperplasia with lower urinary tract symptoms: N40.1

## 2018-07-14 HISTORY — DX: Hyperlipidemia, unspecified: E78.5

## 2018-07-14 HISTORY — DX: Type 2 diabetes mellitus without complications: E11.9

## 2018-07-14 HISTORY — DX: Type 2 diabetes mellitus with diabetic polyneuropathy: E11.42

## 2018-07-14 LAB — GLUCOSE, CAPILLARY: Glucose-Capillary: 94 mg/dL (ref 70–99)

## 2018-07-14 SURGERY — INSERTION, RADIATION SOURCE, PROSTATE
Anesthesia: General | Site: Prostate

## 2018-07-14 MED ORDER — PROPOFOL 10 MG/ML IV BOLUS
INTRAVENOUS | Status: AC
  Filled 2018-07-14: qty 40

## 2018-07-14 MED ORDER — SODIUM CHLORIDE (PF) 0.9 % IJ SOLN
INTRAMUSCULAR | Status: DC | PRN
  Administered 2018-07-14: 10 mL

## 2018-07-14 MED ORDER — OXYCODONE HCL 5 MG PO TABS
5.0000 mg | ORAL_TABLET | Freq: Once | ORAL | Status: DC | PRN
  Filled 2018-07-14: qty 1

## 2018-07-14 MED ORDER — OXYCODONE HCL 5 MG/5ML PO SOLN
5.0000 mg | Freq: Once | ORAL | Status: DC | PRN
  Filled 2018-07-14: qty 5

## 2018-07-14 MED ORDER — PHENYLEPHRINE HCL (PRESSORS) 10 MG/ML IV SOLN
INTRAVENOUS | Status: DC | PRN
  Administered 2018-07-14: 120 ug via INTRAVENOUS

## 2018-07-14 MED ORDER — LIDOCAINE 2% (20 MG/ML) 5 ML SYRINGE
INTRAMUSCULAR | Status: AC
  Filled 2018-07-14: qty 5

## 2018-07-14 MED ORDER — IOHEXOL 300 MG/ML  SOLN
INTRAMUSCULAR | Status: DC | PRN
  Administered 2018-07-14: 7 mL

## 2018-07-14 MED ORDER — CIPROFLOXACIN HCL 500 MG PO TABS: 500.0000 mg | ORAL_TABLET | Freq: Two times a day (BID) | ORAL | 0 refills | Status: DC

## 2018-07-14 MED ORDER — FENTANYL CITRATE (PF) 100 MCG/2ML IJ SOLN
INTRAMUSCULAR | Status: DC | PRN
  Administered 2018-07-14 (×4): 25 ug via INTRAVENOUS

## 2018-07-14 MED ORDER — PHENYLEPHRINE 40 MCG/ML (10ML) SYRINGE FOR IV PUSH (FOR BLOOD PRESSURE SUPPORT)
PREFILLED_SYRINGE | INTRAVENOUS | Status: AC
  Filled 2018-07-14: qty 10

## 2018-07-14 MED ORDER — PROPOFOL 10 MG/ML IV BOLUS
INTRAVENOUS | Status: DC | PRN
  Administered 2018-07-14: 50 mg via INTRAVENOUS
  Administered 2018-07-14: 150 mg via INTRAVENOUS
  Administered 2018-07-14: 50 mg via INTRAVENOUS
  Administered 2018-07-14: 40 mg via INTRAVENOUS

## 2018-07-14 MED ORDER — KETOROLAC TROMETHAMINE 30 MG/ML IJ SOLN
INTRAMUSCULAR | Status: DC | PRN
  Administered 2018-07-14: 30 mg via INTRAVENOUS

## 2018-07-14 MED ORDER — ONDANSETRON HCL 4 MG/2ML IJ SOLN
INTRAMUSCULAR | Status: DC | PRN
  Administered 2018-07-14: 4 mg via INTRAVENOUS

## 2018-07-14 MED ORDER — HYDROMORPHONE HCL 1 MG/ML IJ SOLN
0.2500 mg | INTRAMUSCULAR | Status: DC | PRN
  Filled 2018-07-14: qty 0.5

## 2018-07-14 MED ORDER — FENTANYL CITRATE (PF) 100 MCG/2ML IJ SOLN
INTRAMUSCULAR | Status: AC
  Filled 2018-07-14: qty 2

## 2018-07-14 MED ORDER — SODIUM CHLORIDE 0.9 % IV SOLN
INTRAVENOUS | Status: AC | PRN
  Administered 2018-07-14: 1000 mL

## 2018-07-14 MED ORDER — CIPROFLOXACIN IN D5W 400 MG/200ML IV SOLN
400.0000 mg | INTRAVENOUS | Status: AC
  Administered 2018-07-14: 400 mg via INTRAVENOUS
  Filled 2018-07-14: qty 200

## 2018-07-14 MED ORDER — FLEET ENEMA 7-19 GM/118ML RE ENEM
1.0000 | ENEMA | Freq: Once | RECTAL | Status: DC
  Filled 2018-07-14: qty 1

## 2018-07-14 MED ORDER — CIPROFLOXACIN IN D5W 400 MG/200ML IV SOLN
INTRAVENOUS | Status: AC
  Filled 2018-07-14: qty 200

## 2018-07-14 MED ORDER — PROMETHAZINE HCL 25 MG/ML IJ SOLN
6.2500 mg | INTRAMUSCULAR | Status: DC | PRN
  Filled 2018-07-14: qty 1

## 2018-07-14 MED ORDER — LIDOCAINE HCL (CARDIAC) PF 100 MG/5ML IV SOSY
PREFILLED_SYRINGE | INTRAVENOUS | Status: DC | PRN
  Administered 2018-07-14: 60 mg via INTRAVENOUS
  Administered 2018-07-14: 40 mg via INTRAVENOUS

## 2018-07-14 MED ORDER — DEXAMETHASONE SODIUM PHOSPHATE 10 MG/ML IJ SOLN
INTRAMUSCULAR | Status: AC
  Filled 2018-07-14: qty 1

## 2018-07-14 MED ORDER — DEXAMETHASONE SODIUM PHOSPHATE 4 MG/ML IJ SOLN
INTRAMUSCULAR | Status: DC | PRN
  Administered 2018-07-14: 5 mg via INTRAVENOUS

## 2018-07-14 MED ORDER — ONDANSETRON HCL 4 MG/2ML IJ SOLN
INTRAMUSCULAR | Status: AC
  Filled 2018-07-14: qty 2

## 2018-07-14 MED ORDER — HYDROCODONE-ACETAMINOPHEN 10-325 MG PO TABS: 1.0000 | ORAL_TABLET | ORAL | 0 refills | Status: DC | PRN

## 2018-07-14 MED ORDER — LACTATED RINGERS IV SOLN
INTRAVENOUS | Status: DC
  Administered 2018-07-14: 09:00:00 via INTRAVENOUS
  Filled 2018-07-14: qty 1000

## 2018-07-14 SURGICAL SUPPLY — 48 items
BAG URINE DRAINAGE (UROLOGICAL SUPPLIES) ×4 IMPLANT
BLADE CLIPPER SENSICLIP SURGIC (BLADE) ×4 IMPLANT
CATH FOLEY 2WAY SLVR  5CC 16FR (CATHETERS) ×1
CATH FOLEY 2WAY SLVR 5CC 16FR (CATHETERS) ×3 IMPLANT
CATH ROBINSON RED A/P 16FR (CATHETERS) ×4 IMPLANT
CATH ROBINSON RED A/P 20FR (CATHETERS) ×4 IMPLANT
CLOTH BEACON ORANGE TIMEOUT ST (SAFETY) ×4 IMPLANT
CONT SPECI 4OZ STER CLIK (MISCELLANEOUS) ×8 IMPLANT
COVER BACK TABLE 60X90IN (DRAPES) ×4 IMPLANT
COVER MAYO STAND STRL (DRAPES) ×4 IMPLANT
COVER WAND RF STERILE (DRAPES) ×4 IMPLANT
DRSG TEGADERM 4X4.75 (GAUZE/BANDAGES/DRESSINGS) ×4 IMPLANT
DRSG TEGADERM 8X12 (GAUZE/BANDAGES/DRESSINGS) ×12 IMPLANT
GAUZE SPONGE 4X4 12PLY STRL (GAUZE/BANDAGES/DRESSINGS) ×4 IMPLANT
GLOVE BIO SURGEON STRL SZ 6 (GLOVE) IMPLANT
GLOVE BIO SURGEON STRL SZ 6.5 (GLOVE) IMPLANT
GLOVE BIO SURGEON STRL SZ7 (GLOVE) IMPLANT
GLOVE BIO SURGEON STRL SZ7.5 (GLOVE) ×8 IMPLANT
GLOVE BIO SURGEON STRL SZ8 (GLOVE) ×8 IMPLANT
GLOVE BIOGEL PI IND STRL 6 (GLOVE) IMPLANT
GLOVE BIOGEL PI IND STRL 6.5 (GLOVE) IMPLANT
GLOVE BIOGEL PI IND STRL 7.0 (GLOVE) IMPLANT
GLOVE BIOGEL PI IND STRL 7.5 (GLOVE) ×6 IMPLANT
GLOVE BIOGEL PI IND STRL 8 (GLOVE) IMPLANT
GLOVE BIOGEL PI INDICATOR 6 (GLOVE)
GLOVE BIOGEL PI INDICATOR 6.5 (GLOVE)
GLOVE BIOGEL PI INDICATOR 7.0 (GLOVE)
GLOVE BIOGEL PI INDICATOR 7.5 (GLOVE) ×2
GLOVE BIOGEL PI INDICATOR 8 (GLOVE)
GLOVE ECLIPSE 8.0 STRL XLNG CF (GLOVE) ×16 IMPLANT
GOWN STRL REUS W/ TWL LRG LVL3 (GOWN DISPOSABLE) ×6 IMPLANT
GOWN STRL REUS W/TWL LRG LVL3 (GOWN DISPOSABLE) ×2
GOWN STRL REUS W/TWL XL LVL3 (GOWN DISPOSABLE) ×8 IMPLANT
HOLDER FOLEY CATH W/STRAP (MISCELLANEOUS) IMPLANT
I-Seed AgX100 ×308 IMPLANT
IMPL SPACEOAR SYSTEM 10ML (Spacer) ×3 IMPLANT
IMPLANT SPACEOAR SYSTEM 10ML (Spacer) ×4 IMPLANT
IV NS 1000ML (IV SOLUTION) ×1
IV NS 1000ML BAXH (IV SOLUTION) ×3 IMPLANT
KIT TURNOVER CYSTO (KITS) ×4 IMPLANT
MARKER SKIN DUAL TIP RULER LAB (MISCELLANEOUS) ×4 IMPLANT
PACK CYSTO (CUSTOM PROCEDURE TRAY) ×4 IMPLANT
SURGILUBE 2OZ TUBE FLIPTOP (MISCELLANEOUS) ×4 IMPLANT
SUT BONE WAX W31G (SUTURE) IMPLANT
SYR 10ML LL (SYRINGE) ×4 IMPLANT
TOWEL OR 17X26 10 PK STRL BLUE (TOWEL DISPOSABLE) ×4 IMPLANT
UNDERPAD 30X30 (UNDERPADS AND DIAPERS) ×8 IMPLANT
WATER STERILE IRR 500ML POUR (IV SOLUTION) ×4 IMPLANT

## 2018-07-14 NOTE — Anesthesia Postprocedure Evaluation (Signed)
Anesthesia Post Note  Patient: TARAY NORMOYLE  Procedure(s) Performed: RADIOACTIVE SEED IMPLANT/BRACHYTHERAPY IMPLANT (N/A Prostate) SPACE OAR INSTILLATION (N/A ) CYSTOSCOPY (N/A Bladder)     Patient location during evaluation: PACU Anesthesia Type: General Level of consciousness: awake and alert Pain management: pain level controlled Vital Signs Assessment: post-procedure vital signs reviewed and stable Respiratory status: spontaneous breathing, nonlabored ventilation and respiratory function stable Cardiovascular status: blood pressure returned to baseline and stable Postop Assessment: no apparent nausea or vomiting Anesthetic complications: no    Last Vitals:  Vitals:   07/14/18 1240 07/14/18 1245  BP:  (!) 155/82  Pulse: 79 79  Resp: 12 13  Temp:    SpO2: 94% 95%    Last Pain:  Vitals:   07/14/18 1250  TempSrc:   PainSc: 0-No pain                 Lynda Rainwater

## 2018-07-14 NOTE — Anesthesia Procedure Notes (Signed)
Procedure Name: LMA Insertion Date/Time: 07/14/2018 10:44 AM Performed by: Justice Rocher, CRNA Pre-anesthesia Checklist: Patient identified, Emergency Drugs available, Suction available and Patient being monitored Patient Re-evaluated:Patient Re-evaluated prior to induction Oxygen Delivery Method: Circle system utilized Preoxygenation: Pre-oxygenation with 100% oxygen Induction Type: IV induction Ventilation: Mask ventilation without difficulty LMA: LMA inserted LMA Size: 5.0 Number of attempts: 1 Airway Equipment and Method: Bite block Placement Confirmation: positive ETCO2 and breath sounds checked- equal and bilateral Tube secured with: Tape Dental Injury: Teeth and Oropharynx as per pre-operative assessment  Comments: Top dentures removed. Lower dental teeth - poor condition - spacing protruding

## 2018-07-14 NOTE — Op Note (Signed)
PATIENT:  Cristian Martin  PRE-OPERATIVE DIAGNOSIS:  Adenocarcinoma of the prostate  POST-OPERATIVE DIAGNOSIS:  Same  PROCEDURE:  1. I-125 radioactive seed implantation 2. Cystoscopy  3. Placement of SpaceOAR 4. Fluoroscopy use with time less than 1 hour.  SURGEON:  Surgeon(s): Claybon Jabs  Radiation oncologist: Dr. Tyler Pita  ANESTHESIA:  General  EBL:  Minimal  DRAINS: None  INDICATION: Cristian Martin is a 73 year old male with biopsy-proven adenocarcinoma the prostate.  He presents today for treatment with radioactive seed implant.  Description of procedure: After informed consent the patient was brought to the major OR, placed on the table and administered general anesthesia. He was then moved to the modified lithotomy position with his perineum perpendicular to the floor. His perineum and genitalia were then sterilely prepped. An official timeout was then performed. A 16 French Foley catheter was then placed in the bladder and filled with dilute contrast, a rectal tube was placed in the rectum and the transrectal ultrasound probe was placed in the rectum and affixed to the stand. He was then sterilely draped.  Real time ultrasonography was used along with the seed planning software On[employer] Prostate vs. 4.2.2.4. This was used to develop the seed plan including the number of needles as well as number of seeds required for complete and adequate coverage.  The needles were then preloaded with seeds and spacers according to the previously developed plan.  Real-time ultrasonography was then used along with the previously developed plan to implant a total of 77 seeds using 27 needles. This proceeded without difficulty or complication.   I then proceeded with placement of SpaceOAR by introducing a needle with the bevel angled inferiorly approximately 2 cm superior to the anus. This was angled downward and under direct ultrasound was placed within the space between the prostatic  capsule and rectum. This was confirmed with a small amount of sterile saline injected and this was performed under direct ultrasound. I then attached the SpaceOAR to the needle and injected this in the space between the prostate and rectum with good placement noted.  A Foley catheter was then removed as well as the transrectal ultrasound probe and rectal probe. Flexible cystoscopy was then performed using the 17 French flexible scope which revealed a normal urethra throughout its length down to the sphincter which appeared intact. The prostatic urethra revealed bilobar hypertrophy but no evidence of obstruction, seeds, spacers or lesions. The bladder was then entered and fully and systematically inspected. The ureteral orifices were noted to be of normal configuration and position. The mucosa revealed no evidence of tumors. There were also no stones identified within the bladder. I noted no seeds or spacers on the floor of the bladder and retroflexion of the scope revealed no seeds protruding from the base of the prostate.  C-arm fluoroscopy was then used to evaluate the distribution of seeds placement and aid in determining confirmation of the number of seeds placed.  Real-time fluoroscopy was used with saved images revealing the location of the seeds placed both in AP and oblique views.  The cystoscope was then removed and the patient was awakened and taken to recovery room in stable and satisfactory condition. He tolerated procedure well and there were no intraoperative complications.

## 2018-07-14 NOTE — Discharge Instructions (Signed)

## 2018-07-14 NOTE — Transfer of Care (Signed)
Immediate Anesthesia Transfer of Care Note  Patient: Cristian Martin  Procedure(s) Performed: RADIOACTIVE SEED IMPLANT/BRACHYTHERAPY IMPLANT (N/A Prostate) SPACE OAR INSTILLATION (N/A ) CYSTOSCOPY (N/A Bladder)  Patient Location: PACU  Anesthesia Type:General  Level of Consciousness: awake, alert , oriented and patient cooperative  Airway & Oxygen Therapy: Patient Spontanous Breathing and Patient connected to nasal cannula oxygen  Post-op Assessment: Report given to RN and Post -op Vital signs reviewed and stable  Post vital signs: Reviewed and stable  Last Vitals:  Vitals Value Taken Time  BP    Temp    Pulse    Resp    SpO2      Last Pain:  Vitals:   07/14/18 0841  TempSrc: Oral  PainSc: 0-No pain      Patients Stated Pain Goal: 8 (17/49/44 9675)  Complications: No apparent anesthesia complications

## 2018-07-14 NOTE — Anesthesia Preprocedure Evaluation (Addendum)
Anesthesia Evaluation  Patient identified by MRN, date of birth, ID band Patient awake    Reviewed: Allergy & Precautions, NPO status , Patient's Chart, lab work & pertinent test results  Airway Mallampati: II  TM Distance: >3 FB Neck ROM: Full    Dental  (+) Edentulous Upper, Poor Dentition, Dental Advisory Given, Missing   Pulmonary neg pulmonary ROS,    Pulmonary exam normal breath sounds clear to auscultation       Cardiovascular hypertension, Pt. on medications negative cardio ROS Normal cardiovascular exam Rhythm:Regular Rate:Normal     Neuro/Psych negative neurological ROS  negative psych ROS   GI/Hepatic Neg liver ROS, GERD  ,  Endo/Other  negative endocrine ROSdiabetes, Type 2  Renal/GU negative Renal ROS  negative genitourinary   Musculoskeletal negative musculoskeletal ROS (+)   Abdominal   Peds negative pediatric ROS (+)  Hematology negative hematology ROS (+)   Anesthesia Other Findings   Reproductive/Obstetrics negative OB ROS                            Anesthesia Physical Anesthesia Plan  ASA: III  Anesthesia Plan: General   Post-op Pain Management:    Induction: Intravenous  PONV Risk Score and Plan: 2 and Ondansetron, Midazolam and Treatment may vary due to age or medical condition  Airway Management Planned: LMA  Additional Equipment:   Intra-op Plan:   Post-operative Plan: Extubation in OR  Informed Consent: I have reviewed the patients History and Physical, chart, labs and discussed the procedure including the risks, benefits and alternatives for the proposed anesthesia with the patient or authorized representative who has indicated his/her understanding and acceptance.     Dental advisory given  Plan Discussed with: CRNA  Anesthesia Plan Comments:         Anesthesia Quick Evaluation

## 2018-07-16 NOTE — Progress Notes (Signed)
  Radiation Oncology         [phone] ________________________________  Name: Cristian Martin MRN: [id number]  Date: 07/16/2018  DOB: 03-14-1945       Prostate Seed Implant  ES:[id number], Reece Leader, MD  No ref. provider found  DIAGNOSIS: 73 y.o. gentleman with Stage T1c adenocarcinoma of the prostate with Gleason score of 3+4, and PSA of 7.52.    ICD-10-CM   1. Malignant neoplasm of prostate [facility])  [place] Discharge patient    PROCEDURE: Insertion of radioactive I-125 seeds into the prostate gland.  RADIATION DOSE: 145 Gy, definitive/boost therapy.  TECHNIQUE: Cristian Martin was brought to the operating room with the urologist. He was placed in the dorsolithotomy position. He was catheterized and a rectal tube was inserted. The perineum was shaved, prepped and draped. The ultrasound probe was then introduced into the rectum to see the prostate gland.  TREATMENT DEVICE: A needle grid was attached to the ultrasound probe stand and anchor needles were placed.  3D PLANNING: The prostate was imaged in 3D using a sagittal sweep of the prostate probe. These images were transferred to the planning computer. There, the prostate, urethra and rectum were defined on each axial reconstructed image. Then, the software created an optimized 3D plan and a few seed positions were adjusted. The quality of the plan was reviewed using [facility] information for the target and the following two organs at risk:  Urethra and Rectum.  Then the accepted plan was printed and handed off to the radiation therapist.  Under my supervision, the custom loading of the seeds and spacers was carried out and loaded into sealed vicryl sleeves.  These pre-loaded needles were then placed into the needle holder.Marland Kitchen  PROSTATE VOLUME STUDY:  Using transrectal ultrasound the volume of the prostate was verified to be 55.5 cc.  SPECIAL TREATMENT PROCEDURE/SUPERVISION AND HANDLING: The pre-loaded needles were then delivered under sagittal guidance. A  total of 27 needles were used to deposit 77 seeds in the prostate gland. The individual seed activity was 0.525 mCi.  SpaceOAR:  Yes  COMPLEX SIMULATION: At the end of the procedure, an anterior radiograph of the pelvis was obtained to document seed positioning and count. Cystoscopy was performed to check the urethra and bladder.  MICRODOSIMETRY: At the end of the procedure, the patient was emitting 0.11 mR/hr at 1 meter. Accordingly, he was considered safe for hospital discharge.  PLAN: The patient will return to the radiation oncology clinic for post implant CT dosimetry in three weeks.   ________________________________  Sheral Apley Tammi Klippel, M.D.

## 2018-07-17 ENCOUNTER — Encounter (HOSPITAL_BASED_OUTPATIENT_CLINIC_OR_DEPARTMENT_OTHER): Payer: Self-pay | Admitting: Urology

## 2018-07-20 ENCOUNTER — Other Ambulatory Visit: Payer: Self-pay | Admitting: *Deleted

## 2018-07-20 NOTE — Patient Outreach (Signed)
[facility]) Care Management  07/20/2018  Cristian Martin April 24, 1945 [id number]  [facility] Care Management Case Closure note from previously placed [facility] HRA screening phone call  Received confirmation from [place] team that patient is now active with an external program  Plan:  Will close patient case and make patient inactive with [facility] CM  Oneta Rack, RN, BSN, [place] Coordinator [facility] Care Management  [phone]

## 2018-08-02 ENCOUNTER — Telehealth: Payer: Self-pay | Admitting: *Deleted

## 2018-08-02 NOTE — Telephone Encounter (Signed)
CALLED PATIENT TO REMIND OF POST SEED APPTS. AND MRI FOR 08-03-18, SPOKE WHEN PATIENT AND HE IS AWARE OF THESE APPTS.

## 2018-08-03 ENCOUNTER — Ambulatory Visit (HOSPITAL_COMMUNITY)
Admission: RE | Admit: 2018-08-03 | Discharge: 2018-08-03 | Disposition: A | Discharge status: Home / Self Care | Payer: No Typology Code available for payment source | Source: Ambulatory Visit | Attending: Urology | Admitting: Urology

## 2018-08-03 ENCOUNTER — Encounter: Payer: Self-pay | Admitting: Urology

## 2018-08-03 ENCOUNTER — Ambulatory Visit
Admission: RE | Admit: 2018-08-03 | Discharge: 2018-08-03 | Disposition: A | Discharge status: Home / Self Care | Payer: No Typology Code available for payment source | Source: Ambulatory Visit | Attending: Urology | Admitting: Urology

## 2018-08-03 ENCOUNTER — Other Ambulatory Visit: Payer: Self-pay

## 2018-08-03 VITALS — BP 139/71 | HR 95 | Temp 98.6°F | Resp 20 | Wt 211.0 lb

## 2018-08-03 DIAGNOSIS — C61 Malignant neoplasm of prostate: Secondary | ICD-10-CM | POA: Insufficient documentation

## 2018-08-03 DIAGNOSIS — Z794 Long term (current) use of insulin: Secondary | ICD-10-CM | POA: Insufficient documentation

## 2018-08-03 DIAGNOSIS — Z7982 Long term (current) use of aspirin: Secondary | ICD-10-CM | POA: Insufficient documentation

## 2018-08-03 DIAGNOSIS — R3911 Hesitancy of micturition: Secondary | ICD-10-CM | POA: Insufficient documentation

## 2018-08-03 DIAGNOSIS — Z79899 Other long term (current) drug therapy: Secondary | ICD-10-CM | POA: Insufficient documentation

## 2018-08-03 DIAGNOSIS — R350 Frequency of micturition: Secondary | ICD-10-CM | POA: Insufficient documentation

## 2018-08-03 NOTE — Progress Notes (Signed)
Radiation Oncology         (336) [phone] ________________________________  Name: Cristian Martin MRN: [id number]  Date: 08/03/2018  DOB: 09/13/1945  Post-Seed Follow-Up Visit Note  CC: [name], MD  [name], MD  Diagnosis:   73 y.o. gentleman with Stage T1c adenocarcinoma of the prostate with Gleason score of 3+4, and PSA of 7.52.    ICD-10-CM   1. Malignant neoplasm of prostate (HCC)  C61     Interval Since Last Radiation:  3 weeks 07/14/18:  Insertion of radioactive I-125 seeds into the prostate gland; 145 Gy, definitive therapy with placement of SpaceOAR gel.  Narrative:  The patient returns today for routine follow-up.  He is complaining of increased urinary frequency, urgency and urinary hesitation symptoms. He filled out a questionnaire regarding urinary function today providing and overall IPSS score of 10 characterizing his symptoms as mild-moderate.  His nocturia has improved to 2x/night and dysuria has almost completely resolved which he is pleased with.  He specifically denies gross hematuria, straining to void, incomplete bladder emptying or incontinence.  His pre-implant score was 17. He reports a healthy appetite and is maintaining his weight.  He denies abdominal pain, nausea, vomiting, diarrhea or constipation.  Overall, he is quite pleased with his progress to date.  ALLERGIES:  is allergic to ace inhibitors.  Meds: Current Outpatient Medications  Medication Sig Dispense Refill  . amLODipine (NORVASC) 10 MG tablet Take 10 mg by mouth daily.    Marland Kitchen aspirin EC 81 MG tablet Take 81 mg by mouth daily.    . ciprofloxacin (CIPRO) 500 MG tablet Take 1 tablet (500 mg total) by mouth 2 (two) times daily. 6 tablet 0  . cloNIDine (CATAPRES) 0.3 MG tablet     . doxazosin (CARDURA) 4 MG tablet Take 2 mg by mouth at bedtime.     . DULoxetine (CYMBALTA) 60 MG capsule Take 60 mg by mouth daily.    Marland Kitchen HYDROcodone-acetaminophen (NORCO) 10-325 MG tablet Take 1-2 tablets by  mouth every 4 (four) hours as needed for moderate pain. Maximum dose per 24 hours - 8 pills 20 tablet 0  . Insulin Aspart Prot & Aspart (NOVOLOG MIX 70/30 FLEXPEN [place]) Inject 60 Units into the skin 2 (two) times daily before a meal.     . losartan (COZAAR) 100 MG tablet Take 100 mg by mouth daily.     . metFORMIN (GLUCOPHAGE) 500 MG tablet Take 500 mg by mouth 3 (three) times daily. One pill in am ,  Two pill at lunch, one pill in pm    . Multiple Vitamins-Minerals (MULTIVITAMIN ADULT PO) Take by mouth.    Marland Kitchen omeprazole (PRILOSEC) 20 MG capsule Take 20 mg by mouth 2 (two) times daily.     . rosuvastatin (CRESTOR) 20 MG tablet Take 20 mg by mouth at bedtime.    . Semaglutide (OZEMPIC, 1 MG/DOSE, [place]) Inject 1 mg into the skin once a week. Sunday's     No current facility-administered medications for this encounter.     Physical Findings: In general this is a well appearing Caucasian male in no acute distress. He's alert and oriented x4 and appropriate throughout the examination. Cardiopulmonary assessment is negative for acute distress and he exhibits normal effort.   Lab Findings: Lab Results  Component Value Date   WBC 9.8 07/11/2018   HGB 13.3 07/11/2018   HCT 42.1 07/11/2018   MCV 89.2 07/11/2018   PLT 241 07/11/2018    Radiographic Findings:  Patient  underwent CT imaging in our clinic for post implant dosimetry. The CT will be reviewed by Dr. Tammi Klippel to confirm there is an adequate distribution of radioactive seeds throughout the prostate gland and ensure that there are no seeds in or near the rectum. His scheduled for prostate MRI this afternoon at 4 PM and those images will be fused with his CT images for further evaluation. We suspect the final radiation plan and dosimetry will show appropriate coverage of the prostate gland. He understands that we will call and inform him of any unexpected findings on further review of his imaging and dosimetry.  Impression/Plan: 73 y.o. gentleman  with Stage T1c adenocarcinoma of the prostate with Gleason score of 3+4, and PSA of 7.52. The patient is recovering from the effects of radiation. His urinary symptoms should gradually improve over the next 4-6 months. We talked about this today. He is encouraged by his improvement already and is otherwise pleased with his outcome. We also talked about long-term follow-up for prostate cancer following seed implant. He understands that ongoing PSA determinations and digital rectal exams will help perform surveillance to rule out disease recurrence. He met with Jiles Crocker, NP at [facility] earlier today and has a follow up appointment scheduled with Dr. Karsten Ro in October 2020. He understands what to expect with his PSA measures. Patient was also educated today about some of the long-term effects from radiation including a small risk for rectal bleeding and possibly erectile dysfunction. We talked about some of the general management approaches to these potential complications. However, I did encourage the patient to contact our office or return at any point if he has questions or concerns related to his previous radiation and prostate cancer.    Nicholos Johns, PA-C

## 2018-08-04 NOTE — Progress Notes (Signed)
  Radiation Oncology         (336) [phone] ________________________________  Name: Cristian Martin MRN: [id number]  Date: 08/03/2018  DOB: [birth year]  COMPLEX SIMULATION NOTE  NARRATIVE:  The patient was brought to the [facility] today following prostate seed implantation approximately one month ago.  Identity was confirmed.  All relevant records and images related to the planned course of therapy were reviewed.  Then, the patient was set-up supine.  CT images were obtained.  The CT images were loaded into the planning software.  Then the prostate and rectum were contoured.  Treatment planning then occurred.  The implanted iodine 125 seeds were identified by the physics staff for projection of radiation distribution  I have requested : 3D Simulation  I have requested a DVH of the following structures: Prostate and rectum.    ________________________________  Sheral Apley Tammi Klippel, M.D.

## 2018-08-16 ENCOUNTER — Encounter: Payer: Self-pay | Admitting: Radiation Oncology

## 2018-08-16 DIAGNOSIS — Z7982 Long term (current) use of aspirin: Secondary | ICD-10-CM | POA: Diagnosis not present

## 2018-08-16 DIAGNOSIS — C61 Malignant neoplasm of prostate: Secondary | ICD-10-CM | POA: Diagnosis present

## 2018-08-16 DIAGNOSIS — Z79899 Other long term (current) drug therapy: Secondary | ICD-10-CM | POA: Diagnosis not present

## 2018-08-20 NOTE — Progress Notes (Signed)
  Radiation Oncology         [phone]) [phone] ________________________________  Name: LUM STILLINGER MRN: [id number]  Date: 08/16/2018  DOB: October 29, 1945  3D Planning Note   Prostate Brachytherapy Post-Implant Dosimetry  Diagnosis: 73 y.o. gentleman with Stage T1c adenocarcinoma of the prostate with Gleason score of 3+4, and PSA of 7.52  Narrative: On a previous date, BOW BUNTYN returned following prostate seed implantation for post implant planning. He underwent CT scan complex simulation to delineate the three-dimensional structures of the pelvis and demonstrate the radiation distribution.  Since that time, the seed localization, and complex isodose planning with dose volume histograms have now been completed.  Results:   Prostate Coverage - The dose of radiation delivered to the 90% or more of the prostate gland (D90) was 113.76% of the prescription dose. This exceeds our goal of greater than 90%. Rectal Sparing - The volume of rectal tissue receiving the prescription dose or higher was 0.33 cc. This falls under our thresholds tolerance of 1.0 cc.  Impression: The prostate seed implant appears to show adequate target coverage and appropriate rectal sparing.  Plan:  The patient will continue to follow with urology for ongoing PSA determinations. I would anticipate a high likelihood for local tumor control with minimal risk for rectal morbidity.  ________________________________  Sheral Apley Tammi Klippel, M.D.

## 2018-10-31 ENCOUNTER — Ambulatory Visit: Payer: Non-veteran care | Admitting: *Deleted

## 2019-02-02 DIAGNOSIS — Z951 Presence of aortocoronary bypass graft: Secondary | ICD-10-CM

## 2019-02-02 HISTORY — DX: Presence of aortocoronary bypass graft: Z95.1

## 2019-04-24 ENCOUNTER — Encounter: Payer: Self-pay | Admitting: Cardiovascular Disease

## 2019-04-24 ENCOUNTER — Ambulatory Visit (INDEPENDENT_AMBULATORY_CARE_PROVIDER_SITE_OTHER): Payer: No Typology Code available for payment source | Admitting: Cardiovascular Disease

## 2019-04-24 ENCOUNTER — Other Ambulatory Visit
Admission: RE | Admit: 2019-04-24 | Discharge: 2019-04-24 | Disposition: A | Discharge status: Home / Self Care | Payer: No Typology Code available for payment source | Source: Ambulatory Visit | Attending: Cardiovascular Disease | Admitting: Cardiovascular Disease

## 2019-04-24 ENCOUNTER — Other Ambulatory Visit: Payer: Self-pay

## 2019-04-24 VITALS — BP 160/90 | HR 91 | Ht 70.0 in | Wt 207.5 lb

## 2019-04-24 DIAGNOSIS — Z01812 Encounter for preprocedural laboratory examination: Secondary | ICD-10-CM | POA: Insufficient documentation

## 2019-04-24 DIAGNOSIS — Z20822 Contact with and (suspected) exposure to covid-19: Secondary | ICD-10-CM | POA: Diagnosis not present

## 2019-04-24 DIAGNOSIS — I10 Essential (primary) hypertension: Secondary | ICD-10-CM

## 2019-04-24 DIAGNOSIS — I200 Unstable angina: Secondary | ICD-10-CM

## 2019-04-24 DIAGNOSIS — E785 Hyperlipidemia, unspecified: Secondary | ICD-10-CM | POA: Diagnosis not present

## 2019-04-24 LAB — SARS CORONAVIRUS 2 (TAT 6-24 HRS): SARS Coronavirus 2: NEGATIVE

## 2019-04-24 NOTE — Patient Instructions (Signed)
Medication Instructions:  Your physician recommends that you continue on your current medications as directed. Please refer to the Current Medication list given to you today.  *If you need a refill on your cardiac medications before your next appointment, please call your pharmacy*   Lab Work: Bmet and Cbc today.  You will need a COVID test prior to your procedure.  Please report to the drive up test site today at the [facility] medical arts building  If you have labs (blood work) drawn today and your tests are completely normal, you will receive your results only by: Cristian Martin MyChart Message (if you have MyChart) OR . A paper copy in the mail If you have any lab test that is abnormal or we need to change your treatment, we will call you to review the results.   Testing/Procedures: Your physician has requested that you have a cardiac catheterization. Cardiac catheterization is used to diagnose and/or treat various heart conditions. Doctors may recommend this procedure for a number of different reasons. The most common reason is to evaluate chest pain. Chest pain can be a symptom of coronary artery disease (CAD), and cardiac catheterization can show whether plaque is narrowing or blocking your heart's arteries. This procedure is also used to evaluate the valves, as well as measure the blood flow and oxygen levels in different parts of your heart. For further information please visit [email or web address]. Please follow instruction sheet, as given.     Follow-Up: At [facility], you and your health needs are our priority.  As part of our continuing mission to provide you with exceptional heart care, we have created designated Provider Care Teams.  These Care Teams include your primary Cardiologist (physician) and Advanced Practice Providers (APPs -  Physician Assistants and Nurse Practitioners) who all work together to provide you with the care you need, when you need it.  We recommend signing up for  the patient portal called "MyChart".  Sign up information is provided on this After Visit Summary.  MyChart is used to connect with patients for Virtual Visits (Telemedicine).  Patients are able to view lab/test results, encounter notes, upcoming appointments, etc.  Non-urgent messages can be sent to your provider as well.   To learn more about what you can do with MyChart, go to [email or web address].    Your next appointment:   4 week(s)  The format for your next appointment:   In Person  Provider:    You may see Dr. Fletcher Anon or one of the following Advanced Practice Providers on your designated Care Team:    Murray Hodgkins, NP  Christell Faith, PA-C  Marrianne Mood, [place]    Other Instructions:     [place] [address] [place] [address] Dept: [phone] Loc: [place]  04/24/2019  You are scheduled for a Cardiac Catheterization on Friday, March 26 with Dr. [name].  1. Please arrive at the [facility] (Main Entrance A) at [facility]: [address], [place] [address] at 6:00 AM (This time is two hours before your procedure to ensure your preparation). Free valet parking service is available.   Special note: Every effort is made to have your procedure done on time. Please understand that emergencies sometimes delay scheduled procedures.  2. Diet: Do not eat solid foods after midnight.  The patient may have clear liquids until 5am upon the day of the procedure.  3. Labs:  You will need to have blood drawn on Tuesday, March 23 at [facility] Entrance, Go to 1st desk on your right to register.  Address: [place], [place] [address]  Open: 8am - 5pm  Phone: [phone]. You do not need to be fasting.  4. Medication instructions in preparation for your procedure:   Contrast Allergy: No   Current Outpatient  Medications (Endocrine & Metabolic):  Cristian Martin  Insulin Aspart Prot & Aspart (NOVOLOG MIX 70/30 FLEXPEN [place]), Inject 60 Units into the skin 2 (two) times daily before a meal.  .  metFORMIN (GLUCOPHAGE) 500 MG tablet, Take 500 mg by mouth 3 (three) times daily. One pill in am ,  Two pill at lunch, one pill in pm .  Semaglutide (OZEMPIC, 1 MG/DOSE, [place]), Inject 1 mg into the skin once a week. Sunday's  Current Outpatient Medications (Cardiovascular):  .  amLODipine (NORVASC) 10 MG tablet, Take 10 mg by mouth daily. .  cloNIDine (CATAPRES) 0.3 MG tablet,  .  doxazosin (CARDURA) 4 MG tablet, Take 2 mg by mouth at bedtime.  Cristian Martin  losartan (COZAAR) 100 MG tablet, Take 100 mg by mouth daily.  .  rosuvastatin (CRESTOR) 20 MG tablet, Take 20 mg by mouth at bedtime.   Current Outpatient Medications (Analgesics):  .  aspirin EC 81 MG tablet, Take 81 mg by mouth daily. Cristian Martin  HYDROcodone-acetaminophen (NORCO) 10-325 MG tablet, Take 1-2 tablets by mouth every 4 (four) hours as needed for moderate pain. Maximum dose per 24 hours - 8 pills   Current Outpatient Medications (Other):  .  ciprofloxacin (CIPRO) 500 MG tablet, Take 1 tablet (500 mg total) by mouth 2 (two) times daily. .  DULoxetine (CYMBALTA) 60 MG capsule, Take 60 mg by mouth daily. .  Multiple Vitamins-Minerals (MULTIVITAMIN ADULT PO), Take by mouth. Cristian Martin  omeprazole (PRILOSEC) 20 MG capsule, Take 20 mg by mouth 2 (two) times daily.  *For reference purposes while preparing patient instructions.   Delete this med list prior to printing instructions for patient.*      Take only 30 units of insulin the night before your procedure. Do not take any insulin on the day of the procedure.  Do not take Diabetes Med Glucophage (Metformin) on the day of the procedure and HOLD 48 HOURS AFTER THE PROCEDURE.  On the morning of your procedure, take your Aspirin and any morning medicines NOT listed above.  You may use sips of water.  5. Plan for one night stay--bring  personal belongings. 6. Bring a current list of your medications and current insurance cards. 7. You MUST have a responsible person to drive you home. 8. Someone MUST be with you the first 24 hours after you arrive home or your discharge will be delayed. 9. Please wear clothes that are easy to get on and off and wear slip-on shoes.  Thank you for allowing [place] to care for you!   -- [facility]

## 2019-04-24 NOTE — H&P (View-Only) (Signed)
Cardiology Office Note   Date:  04/24/2019   ID:  Cristian Martin, Cristian Martin [birth year], MRN [id number]  PCP:  [name], MD  Cardiologist:   [name], MD   Chief Complaint  Patient presents with  . New Patient (Initial Visit)    Abnormal stress test-Patient reports lack of energy; Meds verbally reviewed with patient.      History of Present Illness: Cristian Martin is a 74 y.o. male who was referred from the [place] by Dr. Talbert Cage for evaluation of chest pain and abnormal stress test.  He has no prior cardiac history.  He has prolonged history of type 2 diabetes currently requiring insulin, essential hypertension, hyperlipidemia and prostate cancer status post radiation seed implants in 2020. Over the last 3 to 4 months, he has experienced intermittent episodes of chest pain described as burning sensation happening mostly when he walks to the mailbox.  Symptoms usually last 5 to 10 minutes and resolve with rest.  No symptoms at rest.  He was recently prescribed nitroglycerin and has responded to this very well.  He used antacids before thinking it was related to GERD but had only partial relief.  The chest pain is associated with shortness of breath. He underwent a treadmill nuclear stress test and was able to exercise for 3 minutes.  He had 5 out of 10 chest pain with 1 mm of ST depression.  Perfusion showed large area of ischemia in the anteroseptal and anterior myocardium suggestive of LAD territory ischemia.  EF was calculated to be 47%.  Seems to be a high risk study.    Past Medical History:  Diagnosis Date  . Agent orange exposure   . Diabetic peripheral neuropathy ([place])   . GERD (gastroesophageal reflux disease)   . Hyperlipidemia   . Hyperplasia of prostate with lower urinary tract symptoms (LUTS)   . Hypertension   . Prostate cancer [facility]) urologist-- dr ottelin/  oncologist-- dr Tammi Klippel   dx 02-21-2018  -- Stage T1c, Gleason 3+4  . Type 2 diabetes mellitus treated with  insulin ([place])    followed by [facility] in [place]  . Wears dentures    upper   . Wears glasses    "wears to help double vision"    Past Surgical History:  Procedure Laterality Date  . CYSTOSCOPY N/A 07/14/2018   Procedure: CYSTOSCOPY;  Surgeon: Kathie Rhodes, MD;  Location: [facility];  Service: Urology;  Laterality: N/A;  no seeds in bladder per Dr Karsten Ro  . PROSTATE BIOPSY  02-21-2018  dr Karsten Ro in office  . RADIOACTIVE SEED IMPLANT N/A 07/14/2018   Procedure: RADIOACTIVE SEED IMPLANT/BRACHYTHERAPY IMPLANT;  Surgeon: Kathie Rhodes, MD;  Location: [facility];  Service: Urology;  Laterality: N/A;  77 seeds   . SHOULDER ARTHROSCOPY Left 2018   in [place]   "remove spur"  . SPACE OAR INSTILLATION N/A 07/14/2018   Procedure: SPACE OAR INSTILLATION;  Surgeon: Kathie Rhodes, MD;  Location: [facility];  Service: Urology;  Laterality: N/A;     Current Outpatient Medications  Medication Sig Dispense Refill  . amLODipine (NORVASC) 10 MG tablet Take 10 mg by mouth daily.    Marland Kitchen aspirin EC 81 MG tablet Take 81 mg by mouth daily.    . ciprofloxacin (CIPRO) 500 MG tablet Take 1 tablet (500 mg total) by mouth 2 (two) times daily. 6 tablet 0  . cloNIDine (CATAPRES) 0.3 MG tablet     . doxazosin (CARDURA) 4  MG tablet Take 2 mg by mouth at bedtime.     . DULoxetine (CYMBALTA) 60 MG capsule Take 60 mg by mouth daily.    Marland Kitchen HYDROcodone-acetaminophen (NORCO) 10-325 MG tablet Take 1-2 tablets by mouth every 4 (four) hours as needed for moderate pain. Maximum dose per 24 hours - 8 pills 20 tablet 0  . Insulin Aspart Prot & Aspart (NOVOLOG MIX 70/30 FLEXPEN [place]) Inject 60 Units into the skin 2 (two) times daily before a meal.     . losartan (COZAAR) 100 MG tablet Take 100 mg by mouth daily.     . metFORMIN (GLUCOPHAGE) 500 MG tablet Take 500 mg by mouth 3 (three) times daily. One pill in am ,  Two pill at lunch, one pill in pm    . Multiple Vitamins-Minerals  (MULTIVITAMIN ADULT PO) Take by mouth.    Marland Kitchen omeprazole (PRILOSEC) 20 MG capsule Take 20 mg by mouth 2 (two) times daily.     . rosuvastatin (CRESTOR) 20 MG tablet Take 20 mg by mouth at bedtime.    . Semaglutide (OZEMPIC, 1 MG/DOSE, [place]) Inject 1 mg into the skin once a week. Sunday's     No current facility-administered medications for this visit.    Allergies:   Ace inhibitors    Social History:  The patient  reports that he has never smoked. He has never used smokeless tobacco. He reports previous alcohol use. He reports that he does not use drugs.   Family History:  The patient's family history includes Diabetes in his father and sister; Hypertension in his father; Stroke in his mother.    ROS:  Please see the history of present illness.   Otherwise, review of systems are positive for none.   All other systems are reviewed and negative.    PHYSICAL EXAM: VS:  BP (!) 160/90 (BP Location: Right Arm, Patient Position: Sitting, Cuff Size: Normal)   Pulse 91   Ht 5\' 10"  (1.778 m)   Wt 207 lb 8 oz (94.1 kg)   SpO2 96%   BMI 29.77 kg/m  , BMI Body mass index is 29.77 kg/m. GEN: Well nourished, well developed, in no acute distress  HEENT: normal  Neck: no JVD, carotid bruits, or masses Cardiac: RRR; no murmurs, rubs, or gallops,no edema  Respiratory:  clear to auscultation bilaterally, normal work of breathing GI: soft, nontender, nondistended, + BS MS: no deformity or atrophy  Skin: warm and dry, no rash Neuro:  Strength and sensation are intact Psych: euthymic mood, full affect Radial pulses normal   EKG:  EKG is ordered today. The ekg ordered today demonstrates normal sinus rhythm with no significant ST or T wave changes.   Recent Labs: 07/11/2018: ALT 27; BUN 25; Creatinine, Ser 0.96; Hemoglobin 13.3; Platelets 241; Potassium 4.7; Sodium 140    Lipid Panel No results found for: CHOL, TRIG, HDL, CHOLHDL, VLDL, LDLCALC, LDLDIRECT    Wt Readings from Last 3 Encounters:    04/24/19 207 lb 8 oz (94.1 kg)  08/03/18 211 lb (95.7 kg)  07/14/18 213 lb 9.6 oz (96.9 kg)        No flowsheet data found.    ASSESSMENT AND PLAN:  1.  Chest pain: Highly suggestive of class III angina with high risk nuclear stress test suggestive of proximal LAD stenosis.  Due to this, I recommend proceeding with left heart catheterization and possible PCI.  We discussed the procedure in details as well as risks and benefits.  Planned radial  access.  2.  Essential hypertension: It appears that he was recently started on small dose carvedilol although I do not see the medication his list.  We should consider weaning off clonidine and maximizing treatment with a beta-blocker.  3.  Hyperlipidemia: Currently on rosuvastatin 20 mg once daily.  Recommend a target LDL of less than 70.    Disposition:   FU with me in 1 month  Signed,  [name], MD  04/24/2019 1:56 PM    [facility]

## 2019-04-24 NOTE — Progress Notes (Signed)
Cardiology Office Note   Date:  04/24/2019   ID:  Emmauel, Erk 1945-04-16, MRN [id number]  PCP:  [name], MD  Cardiologist:   [name], MD   Chief Complaint  Patient presents with  . New Patient (Initial Visit)    Abnormal stress test-Patient reports lack of energy; Meds verbally reviewed with patient.      History of Present Illness: Cristian Martin is a 74 y.o. male who was referred from the [place] by Dr. Talbert Cage for evaluation of chest pain and abnormal stress test.  He has no prior cardiac history.  He has prolonged history of type 2 diabetes currently requiring insulin, essential hypertension, hyperlipidemia and prostate cancer status post radiation seed implants in 2020. Over the last 3 to 4 months, he has experienced intermittent episodes of chest pain described as burning sensation happening mostly when he walks to the mailbox.  Symptoms usually last 5 to 10 minutes and resolve with rest.  No symptoms at rest.  He was recently prescribed nitroglycerin and has responded to this very well.  He used antacids before thinking it was related to GERD but had only partial relief.  The chest pain is associated with shortness of breath. He underwent a treadmill nuclear stress test and was able to exercise for 3 minutes.  He had 5 out of 10 chest pain with 1 mm of ST depression.  Perfusion showed large area of ischemia in the anteroseptal and anterior myocardium suggestive of LAD territory ischemia.  EF was calculated to be 47%.  Seems to be a high risk study.    Past Medical History:  Diagnosis Date  . Agent orange exposure   . Diabetic peripheral neuropathy ([place])   . GERD (gastroesophageal reflux disease)   . Hyperlipidemia   . Hyperplasia of prostate with lower urinary tract symptoms (LUTS)   . Hypertension   . Prostate cancer [facility]) urologist-- dr ottelin/  oncologist-- dr Tammi Klippel   dx 02-21-2018  -- Stage T1c, Gleason 3+4  . Type 2 diabetes mellitus treated with  insulin ([place])    followed by [facility] in [place]  . Wears dentures    upper   . Wears glasses    "wears to help double vision"    Past Surgical History:  Procedure Laterality Date  . CYSTOSCOPY N/A 07/14/2018   Procedure: CYSTOSCOPY;  Surgeon: Kathie Rhodes, MD;  Location: [facility];  Service: Urology;  Laterality: N/A;  no seeds in bladder per Dr Karsten Ro  . PROSTATE BIOPSY  02-21-2018  dr Karsten Ro in office  . RADIOACTIVE SEED IMPLANT N/A 07/14/2018   Procedure: RADIOACTIVE SEED IMPLANT/BRACHYTHERAPY IMPLANT;  Surgeon: Kathie Rhodes, MD;  Location: [facility];  Service: Urology;  Laterality: N/A;  77 seeds   . SHOULDER ARTHROSCOPY Left 2018   in [place]   "remove spur"  . SPACE OAR INSTILLATION N/A 07/14/2018   Procedure: SPACE OAR INSTILLATION;  Surgeon: Kathie Rhodes, MD;  Location: [facility];  Service: Urology;  Laterality: N/A;     Current Outpatient Medications  Medication Sig Dispense Refill  . amLODipine (NORVASC) 10 MG tablet Take 10 mg by mouth daily.    Marland Kitchen aspirin EC 81 MG tablet Take 81 mg by mouth daily.    . ciprofloxacin (CIPRO) 500 MG tablet Take 1 tablet (500 mg total) by mouth 2 (two) times daily. 6 tablet 0  . cloNIDine (CATAPRES) 0.3 MG tablet     . doxazosin (CARDURA) 4  MG tablet Take 2 mg by mouth at bedtime.     . DULoxetine (CYMBALTA) 60 MG capsule Take 60 mg by mouth daily.    Marland Kitchen HYDROcodone-acetaminophen (NORCO) 10-325 MG tablet Take 1-2 tablets by mouth every 4 (four) hours as needed for moderate pain. Maximum dose per 24 hours - 8 pills 20 tablet 0  . Insulin Aspart Prot & Aspart (NOVOLOG MIX 70/30 FLEXPEN [place]) Inject 60 Units into the skin 2 (two) times daily before a meal.     . losartan (COZAAR) 100 MG tablet Take 100 mg by mouth daily.     . metFORMIN (GLUCOPHAGE) 500 MG tablet Take 500 mg by mouth 3 (three) times daily. One pill in am ,  Two pill at lunch, one pill in pm    . Multiple Vitamins-Minerals  (MULTIVITAMIN ADULT PO) Take by mouth.    Marland Kitchen omeprazole (PRILOSEC) 20 MG capsule Take 20 mg by mouth 2 (two) times daily.     . rosuvastatin (CRESTOR) 20 MG tablet Take 20 mg by mouth at bedtime.    . Semaglutide (OZEMPIC, 1 MG/DOSE, [place]) Inject 1 mg into the skin once a week. Sunday's     No current facility-administered medications for this visit.    Allergies:   Ace inhibitors    Social History:  The patient  reports that he has never smoked. He has never used smokeless tobacco. He reports previous alcohol use. He reports that he does not use drugs.   Family History:  The patient's family history includes Diabetes in his father and sister; Hypertension in his father; Stroke in his mother.    ROS:  Please see the history of present illness.   Otherwise, review of systems are positive for none.   All other systems are reviewed and negative.    PHYSICAL EXAM: VS:  BP (!) 160/90 (BP Location: Right Arm, Patient Position: Sitting, Cuff Size: Normal)   Pulse 91   Ht 5\' 10"  (1.778 m)   Wt 207 lb 8 oz (94.1 kg)   SpO2 96%   BMI 29.77 kg/m  , BMI Body mass index is 29.77 kg/m. GEN: Well nourished, well developed, in no acute distress  HEENT: normal  Neck: no JVD, carotid bruits, or masses Cardiac: RRR; no murmurs, rubs, or gallops,no edema  Respiratory:  clear to auscultation bilaterally, normal work of breathing GI: soft, nontender, nondistended, + BS MS: no deformity or atrophy  Skin: warm and dry, no rash Neuro:  Strength and sensation are intact Psych: euthymic mood, full affect Radial pulses normal   EKG:  EKG is ordered today. The ekg ordered today demonstrates normal sinus rhythm with no significant ST or T wave changes.   Recent Labs: 07/11/2018: ALT 27; BUN 25; Creatinine, Ser 0.96; Hemoglobin 13.3; Platelets 241; Potassium 4.7; Sodium 140    Lipid Panel No results found for: CHOL, TRIG, HDL, CHOLHDL, VLDL, LDLCALC, LDLDIRECT    Wt Readings from Last 3 Encounters:    04/24/19 207 lb 8 oz (94.1 kg)  08/03/18 211 lb (95.7 kg)  07/14/18 213 lb 9.6 oz (96.9 kg)        No flowsheet data found.    ASSESSMENT AND PLAN:  1.  Chest pain: Highly suggestive of class III angina with high risk nuclear stress test suggestive of proximal LAD stenosis.  Due to this, I recommend proceeding with left heart catheterization and possible PCI.  We discussed the procedure in details as well as risks and benefits.  Planned radial  access.  2.  Essential hypertension: It appears that he was recently started on small dose carvedilol although I do not see the medication his list.  We should consider weaning off clonidine and maximizing treatment with a beta-blocker.  3.  Hyperlipidemia: Currently on rosuvastatin 20 mg once daily.  Recommend a target LDL of less than 70.    Disposition:   FU with me in 1 month  Signed,  [name], MD  04/24/2019 1:56 PM    [facility]

## 2019-04-25 ENCOUNTER — Emergency Department
Admission: EM | Admit: 2019-04-25 | Discharge: 2019-04-25 | Disposition: A | Discharge status: Home / Self Care | Payer: No Typology Code available for payment source | Attending: Emergency Medicine | Admitting: Emergency Medicine

## 2019-04-25 ENCOUNTER — Telehealth: Payer: Self-pay | Admitting: *Deleted

## 2019-04-25 ENCOUNTER — Other Ambulatory Visit: Payer: Self-pay

## 2019-04-25 DIAGNOSIS — Z8546 Personal history of malignant neoplasm of prostate: Secondary | ICD-10-CM | POA: Insufficient documentation

## 2019-04-25 DIAGNOSIS — R7989 Other specified abnormal findings of blood chemistry: Secondary | ICD-10-CM | POA: Diagnosis present

## 2019-04-25 DIAGNOSIS — R899 Unspecified abnormal finding in specimens from other organs, systems and tissues: Secondary | ICD-10-CM | POA: Diagnosis not present

## 2019-04-25 DIAGNOSIS — E875 Hyperkalemia: Secondary | ICD-10-CM | POA: Diagnosis not present

## 2019-04-25 DIAGNOSIS — I10 Essential (primary) hypertension: Secondary | ICD-10-CM | POA: Diagnosis not present

## 2019-04-25 DIAGNOSIS — Z794 Long term (current) use of insulin: Secondary | ICD-10-CM | POA: Insufficient documentation

## 2019-04-25 DIAGNOSIS — E1140 Type 2 diabetes mellitus with diabetic neuropathy, unspecified: Secondary | ICD-10-CM | POA: Diagnosis not present

## 2019-04-25 DIAGNOSIS — Z7982 Long term (current) use of aspirin: Secondary | ICD-10-CM | POA: Diagnosis not present

## 2019-04-25 DIAGNOSIS — Z79899 Other long term (current) drug therapy: Secondary | ICD-10-CM | POA: Diagnosis not present

## 2019-04-25 LAB — CBC WITH DIFFERENTIAL/PLATELET
Basophils Absolute: 0.1 10*3/uL (ref 0.0–0.2)
Basos: 1 %
EOS (ABSOLUTE): 0.9 10*3/uL — ABNORMAL HIGH (ref 0.0–0.4)
Eos: 8 %
Hematocrit: 41 % (ref 37.5–51.0)
Hemoglobin: 13.5 g/dL (ref 13.0–17.7)
Immature Grans (Abs): 0 10*3/uL (ref 0.0–0.1)
Immature Granulocytes: 0 %
Lymphocytes Absolute: 1.8 10*3/uL (ref 0.7–3.1)
Lymphs: 17 %
MCH: 27.4 pg (ref 26.6–33.0)
MCHC: 32.9 g/dL (ref 31.5–35.7)
MCV: 83 fL (ref 79–97)
Monocytes Absolute: 0.8 10*3/uL (ref 0.1–0.9)
Monocytes: 8 %
Neutrophils Absolute: 7 10*3/uL (ref 1.4–7.0)
Neutrophils: 66 %
Platelets: 312 10*3/uL (ref 150–450)
RBC: 4.92 x10E6/uL (ref 4.14–5.80)
RDW: 14.6 % (ref 11.6–15.4)
WBC: 10.6 10*3/uL (ref 3.4–10.8)

## 2019-04-25 LAB — CBC
HCT: 41 % (ref 39.0–52.0)
Hemoglobin: 13.1 g/dL (ref 13.0–17.0)
MCH: 27.5 pg (ref 26.0–34.0)
MCHC: 32 g/dL (ref 30.0–36.0)
MCV: 86 fL (ref 80.0–100.0)
Platelets: 280 10*3/uL (ref 150–400)
RBC: 4.77 MIL/uL (ref 4.22–5.81)
RDW: 14.8 % (ref 11.5–15.5)
WBC: 8 10*3/uL (ref 4.0–10.5)
nRBC: 0 % (ref 0.0–0.2)

## 2019-04-25 LAB — BASIC METABOLIC PANEL
Anion gap: 10 (ref 5–15)
BUN/Creatinine Ratio: 18 (ref 10–24)
BUN: 17 mg/dL (ref 8–27)
BUN: 18 mg/dL (ref 8–23)
CO2: 20 mmol/L (ref 20–29)
CO2: 25 mmol/L (ref 22–32)
Calcium: 5.8 mg/dL — CL (ref 8.6–10.2)
Calcium: 9.1 mg/dL (ref 8.9–10.3)
Chloride: 104 mmol/L (ref 96–106)
Chloride: 104 mmol/L (ref 98–111)
Creatinine, Ser: 0.95 mg/dL (ref 0.76–1.27)
Creatinine, Ser: 0.88 mg/dL (ref 0.61–1.24)
GFR calc Af Amer: 91 mL/min/{1.73_m2} (ref >59)
GFR calc Af Amer: >60 mL/min (ref >60)
GFR calc non Af Amer: 79 mL/min/{1.73_m2} (ref >59)
GFR calc non Af Amer: >60 mL/min (ref >60)
Glucose, Bld: 191 mg/dL — ABNORMAL HIGH (ref 70–99)
Glucose: 44 mg/dL — ABNORMAL LOW (ref 65–99)
Potassium: 7.9 mmol/L (ref 3.5–5.2)
Potassium: 4.7 mmol/L (ref 3.5–5.1)
Sodium: 142 mmol/L (ref 134–144)
Sodium: 139 mmol/L (ref 135–145)

## 2019-04-25 NOTE — Discharge Instructions (Addendum)
You were evaluated for a recent elevated Potassium blood level. Your repeat Potassium level today was within normal limits. Your exam and EKG (heart rhythm tracing) do not show any signs of abnormality. You should follow-up with the [facility] as planned.

## 2019-04-25 NOTE — ED Provider Notes (Signed)
[facility] Emergency Department Provider Note ____________________________________________  Time seen: 1054  I have reviewed the triage vital signs and the nursing notes.  HISTORY  Chief Complaint  Abnormal Lab  HPI Cristian Martin is a 74 y.o. male presents himself to the ED for evaluation of an elevated potassium level.  Patient reportedly had preop testing done yesterday at Dr. Tyrell Antonio office.  He was notified today that his potassium  have reported back at 7.9.  He was advised to report to the ED for repeat testing.  Patient denies any interim complaints, chest pain, shortness of breath, palpitations, or weakness.  He denies any history of elevated potassium in the past.  He is scheduled for a cardiac cath procedure on Friday with Dr. Fletcher Anon.  Past Medical History:  Diagnosis Date  . Agent orange exposure   . Diabetic peripheral neuropathy ([place])   . GERD (gastroesophageal reflux disease)   . Hyperlipidemia   . Hyperplasia of prostate with lower urinary tract symptoms (LUTS)   . Hypertension   . Prostate cancer [facility]) urologist-- dr ottelin/  oncologist-- dr Tammi Klippel   dx 02-21-2018  -- Stage T1c, Gleason 3+4  . Type 2 diabetes mellitus treated with insulin ([place])    followed by [facility] in [place]  . Wears dentures    upper   . Wears glasses    "wears to help double vision"    Patient Active Problem List   Diagnosis Date Noted  . Malignant neoplasm of prostate ([place]) 04/09/2018  . GERD (gastroesophageal reflux disease) 04/07/2018  . Hx of colonic polyps 04/07/2018  . Benign essential hypertension 12/02/2015  . Hyperlipidemia, mixed 05/02/2015  . Chronic painful diabetic neuropathy ([place]) 03/13/2015  . DM (diabetes mellitus) type II controlled with renal manifestation ([place]) 08/08/2013    Past Surgical History:  Procedure Laterality Date  . CYSTOSCOPY N/A 07/14/2018   Procedure: CYSTOSCOPY;  Surgeon: Kathie Rhodes, MD;  Location: [facility];  Service: Urology;  Laterality: N/A;  no seeds in bladder per Dr Karsten Ro  . PROSTATE BIOPSY  02-21-2018  dr Karsten Ro in office  . RADIOACTIVE SEED IMPLANT N/A 07/14/2018   Procedure: RADIOACTIVE SEED IMPLANT/BRACHYTHERAPY IMPLANT;  Surgeon: Kathie Rhodes, MD;  Location: [facility];  Service: Urology;  Laterality: N/A;  77 seeds   . SHOULDER ARTHROSCOPY Left 2018   in [place]   "remove spur"  . SPACE OAR INSTILLATION N/A 07/14/2018   Procedure: SPACE OAR INSTILLATION;  Surgeon: Kathie Rhodes, MD;  Location[facility];  Service: Urology;  Laterality: N/A;    Prior to Admission medications   Medication Sig Start Date End Date Taking? Authorizing Provider  amLODipine (NORVASC) 10 MG tablet Take 10 mg by mouth daily.    [provider]  aspirin EC 81 MG tablet Take 81 mg by mouth daily.    [provider]  ciprofloxacin (CIPRO) 500 MG tablet Take 1 tablet (500 mg total) by mouth 2 (two) times daily. 07/14/18   Kathie Rhodes, MD  cloNIDine (CATAPRES) 0.3 MG tablet  02/13/15   [provider]  doxazosin (CARDURA) 4 MG tablet Take 2 mg by mouth at bedtime.     [provider]  DULoxetine (CYMBALTA) 60 MG capsule Take 60 mg by mouth daily.    [provider]  HYDROcodone-acetaminophen (NORCO) 10-325 MG tablet Take 1-2 tablets by mouth every 4 (four) hours as needed for moderate pain. Maximum dose per 24 hours - 8 pills 07/14/18  Kathie Rhodes, MD  Insulin Aspart Prot & Aspart (NOVOLOG MIX 70/30 FLEXPEN [place]) Inject 60 Units into the skin 2 (two) times daily before a meal.     [provider]  losartan (COZAAR) 100 MG tablet Take 100 mg by mouth daily.     [provider]  metFORMIN (GLUCOPHAGE) 500 MG tablet Take 500 mg by mouth 3 (three) times daily. One pill in am ,  Two pill at lunch, one pill in pm    [provider]  Multiple Vitamins-Minerals (MULTIVITAMIN ADULT PO) Take by mouth.     [provider]  omeprazole (PRILOSEC) 20 MG capsule Take 20 mg by mouth 2 (two) times daily.     [provider]  rosuvastatin (CRESTOR) 20 MG tablet Take 20 mg by mouth at bedtime.    [provider]  Semaglutide (OZEMPIC, 1 MG/DOSE, [place]) Inject 1 mg into the skin once a week. Sunday's    [provider]    Allergies Ace inhibitors  Family History  Problem Relation Age of Onset  . Stroke Mother   . Diabetes Father   . Hypertension Father   . Diabetes Sister   . Cancer Neg Hx     Social History Social History   Tobacco Use  . Smoking status: Never Smoker  . Smokeless tobacco: Never Used  Substance Use Topics  . Alcohol use: Not Currently  . Drug use: Never    Review of Systems  Constitutional: Negative for fever. Cardiovascular: Negative for chest pain. Respiratory: Negative for shortness of breath. Gastrointestinal: Negative for abdominal pain, vomiting and diarrhea. Genitourinary: Negative for dysuria. Musculoskeletal: Negative for back pain. Skin: Negative for rash. Neurological: Negative for headaches, focal weakness or numbness. ____________________________________________  PHYSICAL EXAM:  VITAL SIGNS: ED Triage Vitals [04/25/19 0818]  Enc Vitals Group     BP (!) 153/86     Pulse Rate 87     Resp 16     Temp 98.8 F (37.1 C)     Temp Source Oral     SpO2 98 %     Weight 208 lb (94.3 kg)     Height 5\' 10"  (1.778 m)     Head Circumference      Peak Flow      Pain Score 0     Pain Loc      Pain Edu?      Excl. in [place]?     Constitutional: Alert and oriented. Well appearing and in no distress. Head: Normocephalic and atraumatic. Eyes: Conjunctivae are normal. Normal extraocular movements Cardiovascular: Normal rate, regular rhythm.  No murmurs, rubs, or gallops appreciated.  Normal distal pulses. Respiratory: Normal respiratory effort. No wheezes/rales/rhonchi. Musculoskeletal: Nontender with normal range of motion  in all extremities.  Neurologic:  Normal gait without ataxia. Normal speech and language. No gross focal neurologic deficits are appreciated. Skin:  Skin is warm, dry and intact. No rash noted. Psychiatric: Mood and affect are normal. Patient exhibits appropriate insight and judgment. ____________________________________________   LABS (pertinent positives/negatives) Labs Reviewed  BASIC METABOLIC PANEL - Abnormal; Notable for the following components:      Result Value   Glucose, Bld 191 (*)    All other components within normal limits  CBC  ____________________________________________  EKG  NSR 92 bpm PR Interval 174 ms QT Duration 92 ms No ST changes No STEMI ____________________________________________   PROCEDURES  Procedures ____________________________________________  INITIAL IMPRESSION / ASSESSMENT AND PLAN / ED COURSE  Patient reports to  the ED for evaluation of an abnormal lab test.  He was notified that his potassium for preop testing was elevated at 7.9.  He presents today without complaint.  Repeat potassium is normalized at 4.7.  Patient is discharged after evaluation EKG does not reveal any acute changes.  Falsely elevated potassium likely due to hemolyzation of the blood sample collected.  He will follow-up with Dr. Fletcher Anon for cardiac cath as planned.  Cristian Martin was evaluated in Emergency Department on 04/25/2019 for the symptoms described in the history of present illness. He was evaluated in the context of the global COVID-19 pandemic, which necessitated consideration that the patient might be at risk for infection with the SARS-CoV-2 virus that causes COVID-19. Institutional protocols and algorithms that pertain to the evaluation of patients at risk for COVID-19 are in a state of rapid change based on information released by regulatory bodies including the CDC and federal and state organizations. These policies and algorithms were followed during the patient's  care in the ED. ____________________________________________  FINAL CLINICAL IMPRESSION(S) / ED DIAGNOSES  Final diagnoses:  High serum potassium level  Abnormal laboratory test result      Melvenia Needles, PA-C 04/25/19 1109    Harvest Dark, MD 04/25/19 1431

## 2019-04-25 NOTE — ED Triage Notes (Signed)
Pt states he had pre op yesterday for heart catheterization and they called him from the [place] . States his K+ was >7 and told him to come here for a redraw.

## 2019-04-25 NOTE — Telephone Encounter (Signed)
I was reviewing lab results done 04/24/19 prior to [facility] 04/27/19, found potassium result 7.9, Dr Fletcher Anon notified. Per Dr Meliton Rattan should go to [facility] ED now  for repeat BMP. Pt is aware of lab results and Dr Tyrell Antonio recommendation to go to [facility] ED now for repeat BMP. I notified Heather, charge nurse [place] ED, pt was coming there now for repeat BMP.

## 2019-04-26 ENCOUNTER — Telehealth: Payer: Self-pay | Admitting: *Deleted

## 2019-04-26 ENCOUNTER — Telehealth: Payer: Self-pay | Admitting: Cardiovascular Disease

## 2019-04-26 NOTE — Telephone Encounter (Signed)
Pt contacted pre-catheterization scheduled at [facility] for: Friday April 27, 2019 8 AM Verified arrival time and place: [facility]) at: 6 AM   No solid food after midnight prior to cath, clear liquids until 5 AM day of procedure.  Hold: Metformin-day of procedure. Insulin-AM of procedure. 1/2 Insulin PM prior to procedure   Except hold medications AM meds can be  taken pre-cath with sip of water including: ASA 81 mg   Confirmed patient has responsible adult to drive home post procedure and observe 24 hours after arriving home: yes   Currently, due to Covid-19 pandemic, only one person will be allowed with patient. Must be the same person for patient's entire stay and will be required to wear a mask. They will be asked to wait in the waiting room for the duration of the patient's stay.  Patients are required to wear a mask when they enter the hospital.      COVID-19 Pre-Screening Questions:  . In the past 7 to 10 days have you had a cough,  shortness of breath, headache, congestion, fever (100 or greater) body aches, chills, sore throat, or sudden loss of taste or sense of smell? Shortness of breath, related to heart per pt's wife. . Have you been around anyone with known Covid 19 in the past 7-10 days? no . Have you been around anyone who is awaiting Covid 19 test results in the past 7 to 10 days? no . Have you been around anyone who has been exposed to Covid 19, or has mentioned symptoms of Covid 19 within the past 7 to 10 days? no  I reviewed procedure/mask/visitor instructions, COVID-19 screening questions with patient's wife (Pt permission).

## 2019-04-26 NOTE — Telephone Encounter (Signed)
-----   Message from Wellington Hampshire, MD sent at 04/26/2019  7:56 AM EDT ----- Please forward my note to the referring physicians at the [facility].

## 2019-04-26 NOTE — Telephone Encounter (Signed)
Spoke with Kisha at [place] for cath and fu    Faxed to [phone] per [facility]  For Dr. Lajean Manes .

## 2019-04-27 ENCOUNTER — Inpatient Hospital Stay (HOSPITAL_COMMUNITY)
Admission: RE | Admit: 2019-04-27 | Discharge: 2019-05-04 | DRG: 234 | Disposition: A | Discharge status: Home / Self Care | Payer: No Typology Code available for payment source | Source: Ambulatory Visit | Attending: Cardiothoracic Surgery | Admitting: Cardiothoracic Surgery

## 2019-04-27 ENCOUNTER — Inpatient Hospital Stay (HOSPITAL_COMMUNITY): Payer: No Typology Code available for payment source

## 2019-04-27 ENCOUNTER — Encounter (HOSPITAL_COMMUNITY)
Admission: RE | Disposition: A | Discharge status: Home / Self Care | Payer: Self-pay | Source: Ambulatory Visit | Attending: Cardiothoracic Surgery

## 2019-04-27 ENCOUNTER — Other Ambulatory Visit: Payer: Self-pay

## 2019-04-27 ENCOUNTER — Other Ambulatory Visit: Payer: Self-pay | Admitting: *Deleted

## 2019-04-27 DIAGNOSIS — Z8546 Personal history of malignant neoplasm of prostate: Secondary | ICD-10-CM

## 2019-04-27 DIAGNOSIS — Z888 Allergy status to other drugs, medicaments and biological substances status: Secondary | ICD-10-CM

## 2019-04-27 DIAGNOSIS — I10 Essential (primary) hypertension: Secondary | ICD-10-CM | POA: Diagnosis present

## 2019-04-27 DIAGNOSIS — E1142 Type 2 diabetes mellitus with diabetic polyneuropathy: Secondary | ICD-10-CM | POA: Diagnosis present

## 2019-04-27 DIAGNOSIS — E785 Hyperlipidemia, unspecified: Secondary | ICD-10-CM | POA: Diagnosis present

## 2019-04-27 DIAGNOSIS — Z833 Family history of diabetes mellitus: Secondary | ICD-10-CM | POA: Diagnosis not present

## 2019-04-27 DIAGNOSIS — Z01818 Encounter for other preprocedural examination: Secondary | ICD-10-CM

## 2019-04-27 DIAGNOSIS — Z575 Occupational exposure to toxic agents in other industries: Secondary | ICD-10-CM | POA: Diagnosis not present

## 2019-04-27 DIAGNOSIS — I2510 Atherosclerotic heart disease of native coronary artery without angina pectoris: Secondary | ICD-10-CM

## 2019-04-27 DIAGNOSIS — I200 Unstable angina: Secondary | ICD-10-CM | POA: Diagnosis not present

## 2019-04-27 DIAGNOSIS — I25110 Atherosclerotic heart disease of native coronary artery with unstable angina pectoris: Principal | ICD-10-CM | POA: Diagnosis present

## 2019-04-27 DIAGNOSIS — Z794 Long term (current) use of insulin: Secondary | ICD-10-CM

## 2019-04-27 DIAGNOSIS — R079 Chest pain, unspecified: Secondary | ICD-10-CM | POA: Diagnosis not present

## 2019-04-27 DIAGNOSIS — D62 Acute posthemorrhagic anemia: Secondary | ICD-10-CM | POA: Diagnosis not present

## 2019-04-27 DIAGNOSIS — H532 Diplopia: Secondary | ICD-10-CM | POA: Diagnosis not present

## 2019-04-27 DIAGNOSIS — Z7982 Long term (current) use of aspirin: Secondary | ICD-10-CM | POA: Diagnosis not present

## 2019-04-27 DIAGNOSIS — Z01810 Encounter for preprocedural cardiovascular examination: Secondary | ICD-10-CM

## 2019-04-27 DIAGNOSIS — Z8249 Family history of ischemic heart disease and other diseases of the circulatory system: Secondary | ICD-10-CM

## 2019-04-27 DIAGNOSIS — Z973 Presence of spectacles and contact lenses: Secondary | ICD-10-CM | POA: Diagnosis not present

## 2019-04-27 DIAGNOSIS — Z923 Personal history of irradiation: Secondary | ICD-10-CM

## 2019-04-27 DIAGNOSIS — Z09 Encounter for follow-up examination after completed treatment for conditions other than malignant neoplasm: Secondary | ICD-10-CM

## 2019-04-27 DIAGNOSIS — Z79899 Other long term (current) drug therapy: Secondary | ICD-10-CM

## 2019-04-27 DIAGNOSIS — E8770 Fluid overload, unspecified: Secondary | ICD-10-CM | POA: Diagnosis not present

## 2019-04-27 DIAGNOSIS — K219 Gastro-esophageal reflux disease without esophagitis: Secondary | ICD-10-CM | POA: Diagnosis not present

## 2019-04-27 DIAGNOSIS — Z951 Presence of aortocoronary bypass graft: Secondary | ICD-10-CM

## 2019-04-27 DIAGNOSIS — E119 Type 2 diabetes mellitus without complications: Secondary | ICD-10-CM

## 2019-04-27 HISTORY — PX: LEFT HEART CATH AND CORONARY ANGIOGRAPHY: CATH118249

## 2019-04-27 HISTORY — DX: Atherosclerotic heart disease of native coronary artery without angina pectoris: I25.10

## 2019-04-27 LAB — PULMONARY FUNCTION TEST
FEF 25-75 Pre: 2.63 L/sec
FEF2575-%Pred-Pre: 115 %
FEV1-%Pred-Pre: 62 %
FEV1-Pre: 1.92 L
FEV1FVC-%Pred-Pre: 118 %
FEV6-%Pred-Pre: 55 %
FEV6-Pre: 2.24 L
FEV6FVC-%Pred-Pre: 106 %
FVC-%Pred-Pre: 52 %
FVC-Pre: 2.24 L
Pre FEV1/FVC ratio: 86 %
Pre FEV6/FVC Ratio: 100 %

## 2019-04-27 LAB — URINALYSIS, ROUTINE W REFLEX MICROSCOPIC
Bacteria, UA: NONE SEEN
Bilirubin Urine: NEGATIVE
Glucose, UA: 50 mg/dL — AB
Hgb urine dipstick: NEGATIVE
Ketones, ur: NEGATIVE mg/dL
Leukocytes,Ua: NEGATIVE
Nitrite: NEGATIVE
Protein, ur: 100 mg/dL — AB
Specific Gravity, Urine: 1.018 (ref 1.005–1.030)
pH: 6 (ref 5.0–8.0)

## 2019-04-27 LAB — GLUCOSE, CAPILLARY
Glucose-Capillary: 158 mg/dL — ABNORMAL HIGH (ref 70–99)
Glucose-Capillary: 125 mg/dL — ABNORMAL HIGH (ref 70–99)
Glucose-Capillary: 231 mg/dL — ABNORMAL HIGH (ref 70–99)
Glucose-Capillary: 91 mg/dL (ref 70–99)
Glucose-Capillary: 98 mg/dL (ref 70–99)

## 2019-04-27 LAB — SURGICAL PCR SCREEN
MRSA, PCR: NEGATIVE
Staphylococcus aureus: NEGATIVE

## 2019-04-27 LAB — ECHOCARDIOGRAM COMPLETE
Height: 70 in
Weight: 3273.6 oz

## 2019-04-27 SURGERY — LEFT HEART CATH AND CORONARY ANGIOGRAPHY
Anesthesia: LOCAL

## 2019-04-27 MED ORDER — INSULIN ASPART PROT & ASPART (70-30 MIX) 100 UNIT/ML PEN
60.0000 [IU] | PEN_INJECTOR | Freq: Two times a day (BID) | SUBCUTANEOUS | Status: DC
  Filled 2019-04-27: qty 3

## 2019-04-27 MED ORDER — ACETAMINOPHEN 325 MG PO TABS: 650.0000 mg | ORAL_TABLET | ORAL | Status: DC | PRN

## 2019-04-27 MED ORDER — HEPARIN (PORCINE) IN NACL 1000-0.9 UT/500ML-% IV SOLN
INTRAVENOUS | Status: AC
  Filled 2019-04-27: qty 1000

## 2019-04-27 MED ORDER — FENTANYL CITRATE (PF) 100 MCG/2ML IJ SOLN
INTRAMUSCULAR | Status: DC | PRN
  Administered 2019-04-27: 50 ug via INTRAVENOUS
  Administered 2019-04-27: 25 ug via INTRAVENOUS

## 2019-04-27 MED ORDER — LIDOCAINE HCL (PF) 1 % IJ SOLN
INTRAMUSCULAR | Status: DC | PRN
  Administered 2019-04-27: 2 mL via INTRADERMAL

## 2019-04-27 MED ORDER — DARIFENACIN HYDROBROMIDE ER 7.5 MG PO TB24
7.5000 mg | ORAL_TABLET | Freq: Every day | ORAL | Status: DC
  Administered 2019-04-28 – 2019-04-29 (×2): 7.5 mg via ORAL
  Filled 2019-04-27 (×4): qty 1

## 2019-04-27 MED ORDER — AMLODIPINE BESYLATE 10 MG PO TABS
10.0000 mg | ORAL_TABLET | Freq: Every day | ORAL | Status: DC
  Administered 2019-04-27 – 2019-04-29 (×3): 10 mg via ORAL
  Filled 2019-04-27 (×3): qty 1

## 2019-04-27 MED ORDER — ASPIRIN EC 81 MG PO TBEC
81.0000 mg | DELAYED_RELEASE_TABLET | Freq: Every day | ORAL | Status: DC
  Administered 2019-04-28 – 2019-04-29 (×2): 81 mg via ORAL
  Filled 2019-04-27 (×2): qty 1

## 2019-04-27 MED ORDER — LIDOCAINE HCL (PF) 1 % IJ SOLN
INTRAMUSCULAR | Status: AC
  Filled 2019-04-27: qty 30

## 2019-04-27 MED ORDER — CALCIUM CARBONATE ANTACID 500 MG PO CHEW: 2.0000 | CHEWABLE_TABLET | Freq: Every day | ORAL | Status: DC | PRN

## 2019-04-27 MED ORDER — ONDANSETRON HCL 4 MG/2ML IJ SOLN: 4.0000 mg | Freq: Four times a day (QID) | INTRAMUSCULAR | Status: DC | PRN

## 2019-04-27 MED ORDER — CLONIDINE HCL 0.2 MG PO TABS
0.2000 mg | ORAL_TABLET | Freq: Three times a day (TID) | ORAL | Status: DC
  Administered 2019-04-27 – 2019-04-29 (×8): 0.2 mg via ORAL
  Filled 2019-04-27 (×8): qty 1

## 2019-04-27 MED ORDER — FAMOTIDINE 20 MG PO TABS
20.0000 mg | ORAL_TABLET | Freq: Two times a day (BID) | ORAL | Status: DC
  Administered 2019-04-27 – 2019-04-29 (×5): 20 mg via ORAL
  Filled 2019-04-27 (×6): qty 1

## 2019-04-27 MED ORDER — SODIUM CHLORIDE 0.9 % IV SOLN: 250.0000 mL | INTRAVENOUS | Status: DC | PRN

## 2019-04-27 MED ORDER — TROSPIUM CHLORIDE ER 60 MG PO CP24: 60.0000 mg | ORAL_CAPSULE | Freq: Every day | ORAL | Status: DC

## 2019-04-27 MED ORDER — ASPIRIN 81 MG PO CHEW
81.0000 mg | CHEWABLE_TABLET | ORAL | Status: AC
  Administered 2019-04-27: 81 mg via ORAL
  Filled 2019-04-27: qty 1

## 2019-04-27 MED ORDER — VERAPAMIL HCL 2.5 MG/ML IV SOLN
INTRAVENOUS | Status: DC | PRN
  Administered 2019-04-27: 10 mL via INTRA_ARTERIAL

## 2019-04-27 MED ORDER — LABETALOL HCL 5 MG/ML IV SOLN: 10.0000 mg | INTRAVENOUS | Status: AC | PRN

## 2019-04-27 MED ORDER — SODIUM CHLORIDE 0.9 % IV SOLN: INTRAVENOUS | Status: AC

## 2019-04-27 MED ORDER — METFORMIN HCL ER 500 MG PO TB24: 1000.0000 mg | ORAL_TABLET | Freq: Every day | ORAL | Status: DC

## 2019-04-27 MED ORDER — SODIUM CHLORIDE 0.9% FLUSH: 3.0000 mL | INTRAVENOUS | Status: DC | PRN

## 2019-04-27 MED ORDER — SODIUM CHLORIDE 0.9% FLUSH
3.0000 mL | Freq: Two times a day (BID) | INTRAVENOUS | Status: DC
  Administered 2019-04-27 – 2019-04-29 (×3): 3 mL via INTRAVENOUS

## 2019-04-27 MED ORDER — VERAPAMIL HCL 2.5 MG/ML IV SOLN
INTRAVENOUS | Status: AC
  Filled 2019-04-27: qty 2

## 2019-04-27 MED ORDER — HEPARIN SODIUM (PORCINE) 1000 UNIT/ML IJ SOLN
INTRAMUSCULAR | Status: AC
  Filled 2019-04-27: qty 1

## 2019-04-27 MED ORDER — SODIUM CHLORIDE 0.9 % IV SOLN: INTRAVENOUS | Status: DC

## 2019-04-27 MED ORDER — HEPARIN (PORCINE) 25000 UT/250ML-% IV SOLN
1400.0000 [IU]/h | INTRAVENOUS | Status: DC
  Administered 2019-04-27: 1100 [IU]/h via INTRAVENOUS
  Administered 2019-04-28 – 2019-04-30 (×3): 1400 [IU]/h via INTRAVENOUS
  Filled 2019-04-27 (×5): qty 250

## 2019-04-27 MED ORDER — MIDAZOLAM HCL 2 MG/2ML IJ SOLN
INTRAMUSCULAR | Status: DC | PRN
  Administered 2019-04-27: 1 mg via INTRAVENOUS

## 2019-04-27 MED ORDER — CARVEDILOL 6.25 MG PO TABS
6.2500 mg | ORAL_TABLET | Freq: Two times a day (BID) | ORAL | Status: DC
  Administered 2019-04-27 – 2019-04-29 (×5): 6.25 mg via ORAL
  Filled 2019-04-27 (×5): qty 1

## 2019-04-27 MED ORDER — PANTOPRAZOLE SODIUM 40 MG PO TBEC
40.0000 mg | DELAYED_RELEASE_TABLET | Freq: Every day | ORAL | Status: DC
  Administered 2019-04-27 – 2019-04-29 (×3): 40 mg via ORAL
  Filled 2019-04-27 (×3): qty 1

## 2019-04-27 MED ORDER — DOXAZOSIN MESYLATE 2 MG PO TABS
2.0000 mg | ORAL_TABLET | Freq: Every day | ORAL | Status: DC
  Administered 2019-04-27 – 2019-04-29 (×3): 2 mg via ORAL
  Filled 2019-04-27 (×3): qty 1

## 2019-04-27 MED ORDER — MIDAZOLAM HCL 2 MG/2ML IJ SOLN
INTRAMUSCULAR | Status: AC
  Filled 2019-04-27: qty 2

## 2019-04-27 MED ORDER — INSULIN ASPART PROT & ASPART (70-30 MIX) 100 UNIT/ML ~~LOC~~ SUSP
60.0000 [IU] | Freq: Two times a day (BID) | SUBCUTANEOUS | Status: DC
  Administered 2019-04-27 – 2019-04-29 (×4): 60 [IU] via SUBCUTANEOUS
  Filled 2019-04-27: qty 10

## 2019-04-27 MED ORDER — HEPARIN SODIUM (PORCINE) 1000 UNIT/ML IJ SOLN
INTRAMUSCULAR | Status: DC | PRN
  Administered 2019-04-27: 5000 [IU] via INTRAVENOUS

## 2019-04-27 MED ORDER — IOHEXOL 350 MG/ML SOLN
INTRAVENOUS | Status: DC | PRN
  Administered 2019-04-27: 75 mL via INTRA_ARTERIAL

## 2019-04-27 MED ORDER — ROSUVASTATIN CALCIUM 20 MG PO TABS
40.0000 mg | ORAL_TABLET | Freq: Every day | ORAL | Status: DC
  Administered 2019-04-27 – 2019-04-29 (×3): 40 mg via ORAL
  Filled 2019-04-27 (×3): qty 2

## 2019-04-27 MED ORDER — HEPARIN (PORCINE) IN NACL 1000-0.9 UT/500ML-% IV SOLN
INTRAVENOUS | Status: DC | PRN
  Administered 2019-04-27 (×2): 500 mL

## 2019-04-27 MED ORDER — DULOXETINE HCL 60 MG PO CPEP
60.0000 mg | ORAL_CAPSULE | Freq: Every day | ORAL | Status: DC
  Administered 2019-04-27 – 2019-05-04 (×7): 60 mg via ORAL
  Filled 2019-04-27 (×7): qty 1

## 2019-04-27 MED ORDER — SODIUM CHLORIDE 0.9% FLUSH: 3.0000 mL | Freq: Two times a day (BID) | INTRAVENOUS | Status: DC

## 2019-04-27 MED ORDER — LOSARTAN POTASSIUM 50 MG PO TABS
100.0000 mg | ORAL_TABLET | Freq: Every day | ORAL | Status: DC
  Administered 2019-04-27 – 2019-04-28 (×2): 100 mg via ORAL
  Filled 2019-04-27 (×2): qty 2

## 2019-04-27 MED ORDER — FENTANYL CITRATE (PF) 100 MCG/2ML IJ SOLN
INTRAMUSCULAR | Status: AC
  Filled 2019-04-27: qty 2

## 2019-04-27 SURGICAL SUPPLY — 13 items
CATH INFINITI 5FR ANG PIGTAIL (CATHETERS) ×2 IMPLANT
CATH INFINITI 5FR JK (CATHETERS) ×2 IMPLANT
CATH INFINITI JR4 5F (CATHETERS) ×2 IMPLANT
DEVICE RAD COMP TR BAND LRG (VASCULAR PRODUCTS) ×2 IMPLANT
GLIDESHEATH SLEND SS 6F .021 (SHEATH) ×2 IMPLANT
GUIDEWIRE INQWIRE 1.5J.035X260 (WIRE) ×1 IMPLANT
INQWIRE 1.5J .035X260CM (WIRE) ×2
KIT HEART LEFT (KITS) ×2 IMPLANT
PACK CARDIAC CATHETERIZATION (CUSTOM PROCEDURE TRAY) ×2 IMPLANT
SYR MEDRAD MARK 7 150ML (SYRINGE) ×2 IMPLANT
TRANSDUCER W/STOPCOCK (MISCELLANEOUS) ×2 IMPLANT
TUBING CIL FLEX 10 FLL-RA (TUBING) ×2 IMPLANT
WIRE HI TORQ VERSACORE-J 145CM (WIRE) ×2 IMPLANT

## 2019-04-27 NOTE — Progress Notes (Signed)
Dr. Servando Snare talking w/patient. Wife present at [place] request.

## 2019-04-27 NOTE — Progress Notes (Signed)
TCTS consulted for CABG evaluation. °

## 2019-04-27 NOTE — Progress Notes (Signed)
  Echocardiogram 2D Echocardiogram has been performed.  Cristian Martin 04/27/2019, 2:03 PM

## 2019-04-27 NOTE — Progress Notes (Signed)
ANTICOAGULATION CONSULT NOTE - Initial Consult  Pharmacy Consult for heparin Indication: chest pain/ACS  Allergies  Allergen Reactions  . Ace Inhibitors Other (See Comments)    Hyperkalemia.    Patient Measurements: Height: 5\' 10"  (177.8 cm) Weight: 210 lb (95.3 kg) IBW/kg (Calculated) : 73 Heparin Dosing Weight: 92.5 kg  Vital Signs: Temp: 98.1 F (36.7 C) (03/26 0612) Temp Source: Skin (03/26 0612) BP: 137/67 (03/26 1050) Pulse Rate: 79 (03/26 1050)  Labs: Recent Labs    04/24/19 1430 04/25/19 0821  HGB 13.5 13.1  HCT 41.0 41.0  PLT 312 280  CREATININE 0.95 0.88    Estimated Creatinine Clearance: 85.3 mL/min (by C-G formula based on SCr of 0.88 mg/dL).   Medical History: Past Medical History:  Diagnosis Date  . Agent orange exposure   . Diabetic peripheral neuropathy ([place])   . GERD (gastroesophageal reflux disease)   . Hyperlipidemia   . Hyperplasia of prostate with lower urinary tract symptoms (LUTS)   . Hypertension   . Prostate cancer [facility]) urologist-- dr ottelin/  oncologist-- dr Tammi Klippel   dx 02-21-2018  -- Stage T1c, Gleason 3+4  . Type 2 diabetes mellitus treated with insulin ([place])    followed by [facility] in [place]  . Wears dentures    upper   . Wears glasses    "wears to help double vision"    Assessment: 40 yof found to have 99% stenosis in LAD, not amendable to stenting. Plan for CABG evaluation - restart heparin 8 hours after sheath removal. No AC PTA.   Sheath removed on 3/26@0844 . No s/sx of bleeding.   Goal of Therapy:  Heparin level 0.3-0.7 units/ml Monitor platelets by anticoagulation protocol: Yes   Plan:  Start heparin infusion at 1100 units/hr on 3/26@1700  Check anti-Xa level in 8 hours and daily while on heparin Continue to monitor H&H and platelets F/u CABG eval - possibly 3/29  Antonietta Jewel, PharmD, BCCCP Clinical Pharmacist  Phone: [phone]  Please check AMION for all [place] phone numbers After 10:00 PM, call  [place] [phone] 04/27/2019,11:35 AM

## 2019-04-27 NOTE — Progress Notes (Addendum)
[address]       [place] [address]             [phone]        Cristian Martin [place] [facility] Medical Record [id number] Date of Birth: 1945/06/08  Referring: Dr Fletcher Martin [facility] Cardiologist:Cristian Fletcher Anon, MD  Chief Complaint:  Progressive Chest Pain  History of Present Illness:     Patient is 74 year old male presents with a month long history of increasing stop substernal chest pain usually with exertion but at times with rest.  Noted that sitting down and taking turns symptoms.  He has a history of walking on flat ground to his mailbox which bring on symptoms for the etiology because of the symptoms he contacted, exercise stress test was ordered .  Patient noted he was not able to exercise more then 1 1/2 min per the patient. He was seen several days ago by Dr Lovette Cliche and sent up for outpatient  cath today . He had some pain during cath that resolved.   At time of exam patient comfortable   Patient has history of  DM  for "many years" no in insulin and oral agents using up to 60 unit of insulin twice a day. No previous history of stroke or MI.  June 2020- treated for prostate cancer with radiation seeds   Current Activity/ Functional Status: Patient is independent with mobility/ambulation, transfers, ADL's, IADL's.   Zubrod Score: At the time of surgery this patient's most appropriate activity status/level should be described as: []     0    Normal activity, no symptoms [x]     1    Restricted in physical strenuous activity but ambulatory, able to do out light work []     2    Ambulatory and capable of self care, unable to do work activities, up and about                 more than 50%  Of the time                            []     3    Only limited self care, in bed greater than 50% of waking hours []     4    Completely disabled, no self care, confined to bed or chair []     5    Moribund  Past Medical History:  Diagnosis Date  . Agent  orange exposure   . Diabetic peripheral neuropathy ([place])   . GERD (gastroesophageal reflux disease)   . Hyperlipidemia   . Hyperplasia of prostate with lower urinary tract symptoms (LUTS)   . Hypertension   . Prostate cancer [facility]) urologist-- dr ottelin/  oncologist-- dr Tammi Klippel   dx 02-21-2018  -- Stage T1c, Gleason 3+4  . Type 2 diabetes mellitus treated with insulin ([place])    followed by [facility] in [place]  . Wears dentures    upper   . Wears glasses    "wears to help double vision"    Past Surgical History:  Procedure Laterality Date  . CYSTOSCOPY N/A 07/14/2018   Procedure: CYSTOSCOPY;  Surgeon: Kathie Rhodes, MD;  Location: [facility];  Service: Urology;  Laterality: N/A;  no seeds in bladder per Dr Karsten Ro  . PROSTATE BIOPSY  02-21-2018  dr Karsten Ro in office  . RADIOACTIVE SEED IMPLANT N/A 07/14/2018   Procedure: RADIOACTIVE SEED IMPLANT/BRACHYTHERAPY IMPLANT;  Surgeon: Kathie Rhodes, MD;  Location: [facility];  Service: Urology;  Laterality: N/A;  77 seeds   . SHOULDER ARTHROSCOPY Left 2018   in [place]   "remove spur"  . SPACE OAR INSTILLATION N/A 07/14/2018   Procedure: SPACE OAR INSTILLATION;  Surgeon: Kathie Rhodes, MD;  [facility];  Service: Urology;  Laterality: N/A;    Social History   Tobacco Use  Smoking Status Never Smoker  Smokeless Tobacco Never Used    Social History   Substance and Sexual Activity  Alcohol Use Not Currently     Allergies  Allergen Reactions  . Ace Inhibitors Other (See Comments)    Hyperkalemia.    Current Facility-Administered Medications  Medication Dose Route Frequency Provider Last Rate Last Admin  . 0.9 %  sodium chloride infusion  250 mL Intravenous PRN [name] A, MD      . 0.9 %  sodium chloride infusion   Intravenous Continuous Wellington Hampshire, MD 100 mL/hr at 04/27/19 0639 New Bag at 04/27/19 [id number]  . 0.9 %  sodium chloride infusion   Intravenous  Continuous Wellington Hampshire, MD 75 mL/hr at 04/27/19 0920 Rate Change at 04/27/19 0920  . sodium chloride flush (NS) 0.9 % injection 3 mL  3 mL Intravenous Q12H Arida, Cristian A, MD      . sodium chloride flush (NS) 0.9 % injection 3 mL  3 mL Intravenous PRN Wellington Hampshire, MD        Medications Prior to Admission  Medication Sig Dispense Refill Last Dose  . amLODipine (NORVASC) 10 MG tablet Take 10 mg by mouth daily.   04/26/2019 at Unknown time  . aspirin EC 81 MG tablet Take 81 mg by mouth daily.   04/27/2019 at 0630  . calcium carbonate (TUMS - DOSED IN MG ELEMENTAL CALCIUM) 500 MG chewable tablet Chew 2-3 tablets by mouth daily as needed for indigestion or heartburn.   04/26/2019 at Unknown time  . carvedilol (COREG) 6.25 MG tablet Take 3.125 mg by mouth 2 (two) times daily with a meal.   04/26/2019 at Unknown time  . cloNIDine (CATAPRES) 0.3 MG tablet Take 0.3 mg by mouth 3 (three) times daily.    04/26/2019 at Unknown time  . doxazosin (CARDURA) 4 MG tablet Take 2 mg by mouth at bedtime.    04/26/2019 at Unknown time  . DULoxetine (CYMBALTA) 60 MG capsule Take 60 mg by mouth daily.   04/26/2019 at Unknown time  . famotidine (PEPCID) 20 MG tablet Take 20 mg by mouth 2 (two) times daily.   04/26/2019 at Unknown time  . Insulin Aspart Prot & Aspart (NOVOLOG MIX 70/30 FLEXPEN [place]) Inject 60 Units into the skin 2 (two) times daily before a meal.    04/26/2019 at 1700  . losartan (COZAAR) 100 MG tablet Take 100 mg by mouth daily.    04/26/2019 at Unknown time  . metFORMIN (GLUCOPHAGE-XR) 500 MG 24 hr tablet Take 1,000 mg by mouth in the morning and at bedtime.   04/26/2019 at Unknown time  . Multiple Vitamins-Minerals (MULTIVITAMIN ADULT PO) Take 1 tablet by mouth daily.    04/26/2019 at Unknown time  . omeprazole (PRILOSEC) 20 MG capsule Take 20 mg by mouth 2 (two) times daily.    04/26/2019 at Unknown time  . rosuvastatin (CRESTOR) 20 MG tablet Take 10 mg by mouth daily.    04/26/2019 at Unknown time    . Semaglutide (OZEMPIC, 1 MG/DOSE, [place]) Inject  1 mg into the skin every Sunday.    04/22/2019  . Trospium Chloride 60 MG CP24 Take 60 mg by mouth daily.   04/26/2019 at Unknown time    Family History  Problem Relation Age of Onset  . Stroke Mother   . Diabetes Father   . Hypertension Father   . Diabetes Sister   . Cancer Neg Hx      Review of Systems:       Cardiac Review of Systems: Y or  [    ]= no  Chest Pain [ y   ]  Resting SOB [n   ] Exertional SOB  [ n ]  Orthopnea [ n ]   Pedal Edema [  n ]    Palpitations [n  ] Syncope  [ n ]   Presyncope [ n  ]  General Review of Systems: [Y] = yes [  ]=no Constitional: recent weight change [  ]; anorexia [  ]; fatigue [  ]; nausea [  ]; night sweats [  ]; fever [  ]; or chills [  ]                                                               Dental: Last Dentist visit:   Eye : blurred vision [  ]; diplopia [   ]; vision changes [  ];  Amaurosis fugax[  ]; Resp: cough [  ];  wheezing[  ];  hemoptysis[  ]; shortness of breath[  ]; paroxysmal nocturnal dyspnea[  ]; dyspnea on exertion[  ]; or orthopnea[  ];  GI:  gallstones[  ], vomiting[  ];  dysphagia[  ]; melena[  ];  hematochezia [  ]; heartburn[  ];   Hx of  Colonoscopy[  ]; GU: kidney stones [  ]; hematuria[  ];   dysuria [  ];  nocturia[  ];  history of     obstruction [  ]; urinary frequency [ y ]             Skin: rash, swelling[  ];, hair loss[  ];  peripheral edema[  ];  or itching[  ]; Musculosketetal: myalgias[  ];  joint swelling[  ];  joint erythema[  ];  joint pain[  ];  back pain[  ];  Heme/Lymph: bruising[  ];  bleeding[  ];  anemia[  ];  Neuro: TIA[  ];  headaches[  ];  stroke[  ];  vertigo[  ];  seizures[  ];   paresthesias[  ];  difficulty walking[  ];  Psych:depression[  ]; anxiety[  ];  Endocrine: diabetes[ y ];  thyroid dysfunction[  ];             Physical Exam: BP (!) 155/82   Pulse 83   Temp 98.1 F (36.7 C) (Skin)   Resp 15   Ht 5\' 10"  (1.778 m)   Wt  95.3 kg   SpO2 94%   BMI 30.13 kg/m    General appearance: alert, cooperative and no distress Head: Normocephalic, without obvious abnormality, atraumatic Neck: no adenopathy, no carotid bruit, no JVD, supple, symmetrical, trachea midline and thyroid not enlarged, symmetric, no tenderness/mass/nodules Lymph nodes: Cervical, supraclavicular, and axillary nodes normal. Resp: clear to auscultation bilaterally Cardio: regular  rate and rhythm, S1, S2 normal, no murmur, click, rub or gallop GI: soft, non-tender; bowel sounds normal; no masses,  no organomegaly Extremities: extremities normal, atraumatic, no cyanosis or edema and Homans sign is negative, no sign of DVT 2+ dp and PT pulses bilateral Appears to have adequate vein for bypass   Diagnostic Studies & Laboratory data:    No echo done yet   Recent Radiology Findings:   CARDIAC CATHETERIZATION  Result Date: 04/27/2019  Dist LM to Ost LAD lesion is 99% stenosed.  Ost Cx to Prox Cx lesion is 50% stenosed.  Prox RCA lesion is 70% stenosed.  The left ventricular systolic function is normal.  LV end diastolic pressure is mildly elevated.  The left ventricular ejection fraction is 55-65% by visual estimate.  Prox LAD to Mid LAD lesion is 80% stenosed.  2nd Diag lesion is 70% stenosed.  1.  Left dominant coronary arteries with severe ostial LAD stenosis which is heavily calcified.  Moderate ostial left circumflex stenosis.  Significant disease in the proximal right coronary artery but the vessel is small and nondominant. 2.  Normal LV systolic function and mildly elevated left ventricular end-diastolic pressure. Recommendations: The ostial LAD plaque extends somewhat into the distal left main.  It would be impossible to stent the LAD without jailing the left circumflex.  Due to this and given that the patient is diabetic, I think CABG is a better option.  The patient is not on a P2 Y 12 inhibitor. He started having chest pain at the end of  diagnostic cath and was given fentanyl.  Given the severe stenosis in the LAD, recommend hospital admission and starting heparin drip 8 hours after sheath pull. We consulted cardiothoracic surgery for CABG. I will try to gradually increase his carvedilol dose and see if we can wean him off clonidine.     I have independently reviewed the above radiologic studies and discussed with the patient   Recent Lab Findings: Lab Results  Component Value Date   WBC 8.0 04/25/2019   HGB 13.1 04/25/2019   HCT 41.0 04/25/2019   PLT 280 04/25/2019   GLUCOSE 191 (H) 04/25/2019   ALT 27 07/11/2018   AST 26 07/11/2018   NA 139 04/25/2019   K 4.7 04/25/2019   CL 104 04/25/2019   CREATININE 0.88 04/25/2019   BUN 18 04/25/2019   CO2 25 04/25/2019   INR 1.0 07/11/2018      Assessment / Plan:    #1/ CAD in setting of DM with unstable anginal and preserved LV function- high grade proximal LAD- Coronary artery bypass with anatomy unfavorable for stenting and patient long standing diabetic . #2/ Diabetes Mellitus - insulin dependent with complication of CAD 123456 Prostate cancer - treated June 2020 #4/ essential hypertension - on clonidine   #5- Question of abnormal chest xray June 2010- no follow up noted   Normal renal function - check cr after contrast  Current coronary anatomy and symptoms in the setting of patient now presents for coronary artery bypass graft for folliculitis option for relief of symptoms and preservation myocardial function.  I discussed with the patient and his wife proceeding with coronary artery bypass grafting on this admission.  Scheduled for Monday morning March 29.  Patient is scheduled to start  heparin per cardiology.    The goals risks and alternatives of the planned surgical procedure CABG have been discussed with the patient in detail. The risks of the procedure including death, infection, stroke,  myocardial infarction, bleeding, blood transfusion have all been discussed  specifically.  I have quoted Cristian Martin a 3 % of perioperative mortality and a complication rate as high as 30%. The patient's questions have been answered.Cristian Martin is willing  to proceed with the planned procedure.     Grace Isaac MD      [address] [place] [address] Office [phone]     04/27/2019 10:53 AM     Patient ID: Cristian Martin, male   DOB: Jun 22, 1945, 74 y.o.   MRN: [id number]

## 2019-04-27 NOTE — Interval H&P Note (Signed)
Cath Lab Visit (complete for each Cath Lab visit)  Clinical Evaluation Leading to the Procedure:   ACS: No.  Non-ACS:    Anginal Classification: CCS III  Anti-ischemic medical therapy: Minimal Therapy (1 class of medications)  Non-Invasive Test Results: High-risk stress test findings: cardiac mortality >3%/year  Prior CABG: No previous CABG      History and Physical Interval Note:  04/27/2019 8:11 AM  Cristian Martin  has presented today for surgery, with the diagnosis of unstable angina.  The various methods of treatment have been discussed with the patient and family. After consideration of risks, benefits and other options for treatment, the patient has consented to  Procedure(s): LEFT HEART CATH AND CORONARY ANGIOGRAPHY (N/A) as a surgical intervention.  The patient's history has been reviewed, patient examined, no change in status, stable for surgery.  I have reviewed the patient's chart and labs.  Questions were answered to the patient's satisfaction.     Cristian Martin

## 2019-04-28 DIAGNOSIS — I200 Unstable angina: Secondary | ICD-10-CM

## 2019-04-28 LAB — CBC
HCT: 40.4 % (ref 39.0–52.0)
Hemoglobin: 13 g/dL (ref 13.0–17.0)
MCH: 27.3 pg (ref 26.0–34.0)
MCHC: 32.2 g/dL (ref 30.0–36.0)
MCV: 84.9 fL (ref 80.0–100.0)
Platelets: 262 10*3/uL (ref 150–400)
RBC: 4.76 MIL/uL (ref 4.22–5.81)
RDW: 14.4 % (ref 11.5–15.5)
WBC: 8.4 10*3/uL (ref 4.0–10.5)
nRBC: 0 % (ref 0.0–0.2)

## 2019-04-28 LAB — GLUCOSE, CAPILLARY
Glucose-Capillary: 165 mg/dL — ABNORMAL HIGH (ref 70–99)
Glucose-Capillary: 185 mg/dL — ABNORMAL HIGH (ref 70–99)
Glucose-Capillary: 184 mg/dL — ABNORMAL HIGH (ref 70–99)
Glucose-Capillary: 69 mg/dL — ABNORMAL LOW (ref 70–99)

## 2019-04-28 LAB — HEPARIN LEVEL (UNFRACTIONATED)
Heparin Unfractionated: 0.1 IU/mL — ABNORMAL LOW (ref 0.30–0.70)
Heparin Unfractionated: 0.38 IU/mL (ref 0.30–0.70)
Heparin Unfractionated: 0.39 IU/mL (ref 0.30–0.70)

## 2019-04-28 LAB — COMPREHENSIVE METABOLIC PANEL
ALT: 33 U/L (ref 0–44)
AST: 27 U/L (ref 15–41)
Albumin: 3.7 g/dL (ref 3.5–5.0)
Alkaline Phosphatase: 61 U/L (ref 38–126)
Anion gap: 10 (ref 5–15)
BUN: 14 mg/dL (ref 8–23)
CO2: 27 mmol/L (ref 22–32)
Calcium: 9.3 mg/dL (ref 8.9–10.3)
Chloride: 100 mmol/L (ref 98–111)
Creatinine, Ser: 1.02 mg/dL (ref 0.61–1.24)
GFR calc Af Amer: >60 mL/min (ref >60)
GFR calc non Af Amer: >60 mL/min (ref >60)
Glucose, Bld: 135 mg/dL — ABNORMAL HIGH (ref 70–99)
Potassium: 4.1 mmol/L (ref 3.5–5.1)
Sodium: 137 mmol/L (ref 135–145)
Total Bilirubin: 0.8 mg/dL (ref 0.3–1.2)
Total Protein: 6.8 g/dL (ref 6.5–8.1)

## 2019-04-28 LAB — HEMOGLOBIN A1C
Hgb A1c MFr Bld: 7.2 % — ABNORMAL HIGH (ref 4.8–5.6)
Mean Plasma Glucose: 159.94 mg/dL

## 2019-04-28 LAB — PROTIME-INR
INR: 1 (ref 0.8–1.2)
Prothrombin Time: 12.8 seconds (ref 11.4–15.2)

## 2019-04-28 LAB — LIPID PANEL
Cholesterol: 93 mg/dL (ref 0–200)
HDL: 37 mg/dL — ABNORMAL LOW (ref >40)
LDL Cholesterol: 23 mg/dL (ref 0–99)
Total CHOL/HDL Ratio: 2.5 RATIO
Triglycerides: 167 mg/dL — ABNORMAL HIGH (ref <150)
VLDL: 33 mg/dL (ref 0–40)

## 2019-04-28 NOTE — Progress Notes (Signed)
Cardiac rehab [phone]. Reviewed [facility] booklet with the patient brought incentive spirometry instructed how to use. Discussed sternal precautions post surgery. Patient says he would like to watch the pre-op open heart surgery video when his wife comes to visit this afternoon.Barnet Pall, RN,BSN 04/28/2019 9:44 AM

## 2019-04-28 NOTE — Progress Notes (Signed)
ANTICOAGULATION CONSULT NOTE - Follow-Up Consult  Pharmacy Consult for heparin Indication: chest pain/ACS  Patient Measurements: Height: 5\' 10"  (177.8 cm) Weight: 202 lb 2.6 oz (91.7 kg) IBW/kg (Calculated) : 73 Heparin Dosing Weight: 92.5 kg  Vital Signs: Temp: 98.4 F (36.9 C) (03/27 1637) Temp Source: Oral (03/27 1637) BP: 123/70 (03/27 1615) Pulse Rate: 80 (03/27 1637)  Labs: Recent Labs    04/28/19 0040 04/28/19 1041 04/28/19 1623  HGB 13.0  --   --   HCT 40.4  --   --   PLT 262  --   --   LABPROT 12.8  --   --   INR 1.0  --   --   HEPARINUNFRC 0.10* 0.38 0.39  CREATININE 1.02  --   --     Estimated Creatinine Clearance: 72.3 mL/min (by C-G formula based on SCr of 1.02 mg/dL).   Assessment: 74 yo M found to have 99% stenosis in LAD, not amendable to stenting. Plan for CABG evaluation - restart heparin 8 hours after sheath removal. No AC PTA.   Heparin level remains therapeutic on 1400 units/hr. Noted plans for surgery Monday 3/29.  Goal of Therapy:  Heparin level 0.3-0.7 units/ml Monitor platelets by anticoagulation protocol: Yes   Plan:  Continue Heparin at 1400 units/hr  Daily heparin level and CBC Monitor for any signs/symptoms of bleeding    Thank you for allowing pharmacy to be a part of this patient's care. [place] Crane, Roylene Reason, [facility] Clinical Pharmacist Phone [phone]  04/28/2019 6:25 PM    **Pharmacist phone directory can be found on [email or web address] listed under [place].  04/28/2019 6:24 PM

## 2019-04-28 NOTE — Progress Notes (Addendum)
Progress Note  Patient Name: Cristian Martin Date of Encounter: 04/28/2019  Primary Cardiologist: [name], MD   Subjective   No complaints  Inpatient Medications    Scheduled Meds: . amLODipine  10 mg Oral Daily  . aspirin EC  81 mg Oral Daily  . carvedilol  6.25 mg Oral BID WC  . cloNIDine  0.2 mg Oral TID  . darifenacin  7.5 mg Oral Daily  . doxazosin  2 mg Oral QHS  . DULoxetine  60 mg Oral Daily  . famotidine  20 mg Oral BID  . insulin aspart protamine- aspart  60 Units Subcutaneous BID AC  . losartan  100 mg Oral Daily  . [START ON 04/30/2019] metFORMIN  1,000 mg Oral Q breakfast  . pantoprazole  40 mg Oral Daily  . rosuvastatin  40 mg Oral Daily  . sodium chloride flush  3 mL Intravenous Q12H   Continuous Infusions: . sodium chloride    . heparin 1,400 Units/hr (04/28/19 0309)   PRN Meds: sodium chloride, acetaminophen, calcium carbonate, ondansetron (ZOFRAN) IV, sodium chloride flush   Vital Signs    Vitals:   04/27/19 2216 04/28/19 0014 04/28/19 0315 04/28/19 0457  BP: (!) 151/73 (!) 157/79 (!) 149/78   Pulse:  92 82   Resp:  20 17   Temp:  98.2 F (36.8 C) 98.2 F (36.8 C)   TempSrc:  Oral Oral   SpO2:  95% 93%   Weight:    91.7 kg  Height:        Intake/Output Summary (Last 24 hours) at 04/28/2019 0838 Last data filed at 04/28/2019 0519 Gross per 24 hour  Intake 604.67 ml  Output 1920 ml  Net -1315.33 ml   Last 3 Weights 04/28/2019 04/27/2019 04/27/2019  Weight (lbs) 202 lb 2.6 oz 204 lb 9.6 oz 210 lb  Weight (kg) 91.7 kg 92.806 kg 95.255 kg      Telemetry    SR - Personally Reviewed  ECG    n/a - Personally Reviewed  Physical Exam   GEN: No acute distress.   Neck: No JVD Cardiac: RRR, no murmurs, rubs, or gallops.  Respiratory: Clear to auscultation bilaterally. GI: Soft, nontender, non-distended  MS: No edema; No deformity. Neuro:  Nonfocal  Psych: Normal affect   Labs    High Sensitivity Troponin:  No results for  input(s): TROPONINIHS in the last 720 hours.    Chemistry Recent Labs  Lab 04/24/19 1430 04/25/19 0821 04/28/19 0040  NA 142 139 137  K 7.9* 4.7 4.1  CL 104 104 100  CO2 20 25 27   GLUCOSE 44* 191* 135*  BUN 17 18 14   CREATININE 0.95 0.88 1.02  CALCIUM 5.8* 9.1 9.3  PROT  --   --  6.8  ALBUMIN  --   --  3.7  AST  --   --  27  ALT  --   --  33  ALKPHOS  --   --  61  BILITOT  --   --  0.8  GFRNONAA 79 >60 >60  GFRAA 91 >60 >60  ANIONGAP  --  10 10     Hematology Recent Labs  Lab 04/24/19 1430 04/25/19 0821 04/28/19 0040  WBC 10.6 8.0 8.4  RBC 4.92 4.77 4.76  HGB 13.5 13.1 13.0  HCT 41.0 41.0 40.4  MCV 83 86.0 84.9  MCH 27.4 27.5 27.3  MCHC 32.9 32.0 32.2  RDW 14.6 14.8 14.4  PLT 312 280 262  BNPNo results for input(s): BNP, PROBNP in the last 168 hours.   DDimer No results for input(s): DDIMER in the last 168 hours.   Radiology    CARDIAC CATHETERIZATION  Result Date: 04/27/2019  Dist LM to Ost LAD lesion is 99% stenosed.  Ost Cx to Prox Cx lesion is 50% stenosed.  Prox RCA lesion is 70% stenosed.  The left ventricular systolic function is normal.  LV end diastolic pressure is mildly elevated.  The left ventricular ejection fraction is 55-65% by visual estimate.  Prox LAD to Mid LAD lesion is 80% stenosed.  2nd Diag lesion is 70% stenosed.  1.  Left dominant coronary arteries with severe ostial LAD stenosis which is heavily calcified.  Moderate ostial left circumflex stenosis.  Significant disease in the proximal right coronary artery but the vessel is small and nondominant. 2.  Normal LV systolic function and mildly elevated left ventricular end-diastolic pressure. Recommendations: The ostial LAD plaque extends somewhat into the distal left main.  It would be impossible to stent the LAD without jailing the left circumflex.  Due to this and given that the patient is diabetic, I think CABG is a better option.  The patient is not on a P2 Y 12 inhibitor. He  started having chest pain at the end of diagnostic cath and was given fentanyl.  Given the severe stenosis in the LAD, recommend hospital admission and starting heparin drip 8 hours after sheath pull. We consulted cardiothoracic surgery for CABG. I will try to gradually increase his carvedilol dose and see if we can wean him off clonidine.   ECHOCARDIOGRAM COMPLETE  Result Date: 04/27/2019    ECHOCARDIOGRAM REPORT   Patient Name:   Cristian Martin Date of Exam: 04/27/2019 Medical Rec #:  [id number]      Height:       70.0 in Accession #:    [id number]     Weight:       210.0 lb Date of Birth:  [birth year]      BSA:          2.131 m Patient Age:    74 years       BP:           138/86 mmHg Patient Gender: M              HR:           79 bpm. Exam Location:  Inpatient Procedure: 2D Echo, Cardiac Doppler and Color Doppler Indications:    Chest pain 786.50/R07.9  History:        Patient has no prior history of Echocardiogram examinations.                 Risk Factors:Hypertension, Diabetes and Dyslipidemia. Unstable                 angina. GERD.  Sonographer:    Clayton Lefort RDCS (AE) Referring Phys: [id number] Lilia Argue GERHARDT IMPRESSIONS  1. Left ventricular ejection fraction, by estimation, is 55 to 60%. The left ventricle has normal function. The left ventricle has no regional wall motion abnormalities. There is moderate concentric left ventricular hypertrophy. Left ventricular diastolic parameters are consistent with Grade I diastolic dysfunction (impaired relaxation).  2. Right ventricular systolic function is normal. The right ventricular size is normal. Tricuspid regurgitation signal is inadequate for assessing PA pressure.  3. The mitral valve is grossly normal. No evidence of mitral valve regurgitation. No evidence of mitral stenosis.  4. The aortic valve  is tricuspid. Aortic valve regurgitation is not visualized. Mild aortic valve sclerosis is present, with no evidence of aortic valve stenosis.  5. The inferior vena  cava is normal in size with greater than 50% respiratory variability, suggesting right atrial pressure of 3 mmHg. FINDINGS  Left Ventricle: Left ventricular ejection fraction, by estimation, is 55 to 60%. The left ventricle has normal function. The left ventricle has no regional wall motion abnormalities. The left ventricular internal cavity size was normal in size. There is  moderate concentric left ventricular hypertrophy. Left ventricular diastolic parameters are consistent with Grade I diastolic dysfunction (impaired relaxation). Right Ventricle: The right ventricular size is normal. No increase in right ventricular wall thickness. Right ventricular systolic function is normal. Tricuspid regurgitation signal is inadequate for assessing PA pressure. Left Atrium: Left atrial size was normal in size. Right Atrium: Right atrial size was normal in size. Pericardium: There is no evidence of pericardial effusion. Mitral Valve: The mitral valve is grossly normal. No evidence of mitral valve regurgitation. No evidence of mitral valve stenosis. MV peak gradient, 3.2 mmHg. The mean mitral valve gradient is 1.0 mmHg. Tricuspid Valve: The tricuspid valve is grossly normal. Tricuspid valve regurgitation is not demonstrated. No evidence of tricuspid stenosis. Aortic Valve: The aortic valve is tricuspid. Aortic valve regurgitation is not visualized. Mild aortic valve sclerosis is present, with no evidence of aortic valve stenosis. Aortic valve mean gradient measures 5.0 mmHg. Aortic valve peak gradient measures 8.2 mmHg. Aortic valve area, by VTI measures 2.68 cm. Pulmonic Valve: The pulmonic valve was grossly normal. Pulmonic valve regurgitation is not visualized. No evidence of pulmonic stenosis. Aorta: The aortic root is normal in size and structure. Venous: The inferior vena cava is normal in size with greater than 50% respiratory variability, suggesting right atrial pressure of 3 mmHg. IAS/Shunts: No atrial level shunt  detected by color flow Doppler.  LEFT VENTRICLE PLAX 2D LVIDd:         4.50 cm  Diastology LVIDs:         3.30 cm  LV e' lateral:   8.05 cm/s LV PW:         1.30 cm  LV E/e' lateral: 9.9 LV IVS:        1.47 cm  LV e' medial:    6.85 cm/s LVOT diam:     2.20 cm  LV E/e' medial:  11.6 LV SV:         78 LV SV Index:   37 LVOT Area:     3.80 cm  RIGHT VENTRICLE             IVC RV Basal diam:  3.00 cm     IVC diam: 1.90 cm RV S prime:     13.90 cm/s TAPSE (M-mode): 1.8 cm LEFT ATRIUM           Index       RIGHT ATRIUM           Index LA diam:      3.30 cm 1.55 cm/m  RA Area:     10.20 cm LA Vol (A2C): 42.2 ml 19.80 ml/m RA Volume:   19.10 ml  8.96 ml/m LA Vol (A4C): 53.2 ml 24.96 ml/m  AORTIC VALVE AV Area (Vmax):    2.36 cm AV Area (Vmean):   2.42 cm AV Area (VTI):     2.68 cm AV Vmax:           143.00 cm/s AV Vmean:  104.000 cm/s AV VTI:            0.292 m AV Peak Grad:      8.2 mmHg AV Mean Grad:      5.0 mmHg LVOT Vmax:         88.80 cm/s LVOT Vmean:        66.100 cm/s LVOT VTI:          0.206 m LVOT/AV VTI ratio: 0.71  AORTA Ao Root diam: 3.50 cm Ao Asc diam:  3.60 cm MITRAL VALVE MV Area (PHT): 4.80 cm    SHUNTS MV Peak grad:  3.2 mmHg    Systemic VTI:  0.21 m MV Mean grad:  1.0 mmHg    Systemic Diam: 2.20 cm MV Vmax:       0.89 m/s MV Vmean:      52.0 cm/s MV Decel Time: 158 msec MV E velocity: 79.30 cm/s MV A velocity: 83.60 cm/s MV E/A ratio:  0.95 Eleonore Chiquito MD Electronically signed by Eleonore Chiquito MD Signature Date/Time: 04/27/2019/4:17:24 PM    Final     Cardiac Studies     Patient Profile     74 y.o. male history of DM2, HTN, HL, prostate CAD s/p radiation seed implants with chest pain. Presented for outpatient cath, admitted   [place]    1. Chest pain/CAD/Unstable angina - seen by Dr Fletcher Anon as outpatient 3/23 with chest pain - high risk nuclear stress suggesting prox LAD stenosis - outpatient cath 3/26 with distal LM/ostial LAD 99%, prox to mid LAD 80%, D2 70%,  LCX 50%, RCA 70%. LVEDP 18 - 04/2019 echo LVEF 55-60%, no WMAs, grade I DDX - CT surgery consulted for CABG, plan for surgery on Monday  - medical therapy with ASA 81, coreg 6.25mg  bid, hep gtt, losartan 100, crestor 40  2. HTN - on multiple agents at home.  - work on weaning clonidine over time and optimizing other bp meds with further cardiac benefits after surgery. Room to titrate beta blocker but would avoid being 48 hours from surgery. Continue current regimen at this time.      For questions or updates, please contact [place] Please consult [email or web address] for contact info under        Signed, Carlyle Dolly, MD  04/28/2019, 8:38 AM

## 2019-04-28 NOTE — Progress Notes (Signed)
ANTICOAGULATION CONSULT NOTE - Follow-Up Consult  Pharmacy Consult for heparin Indication: chest pain/ACS  Patient Measurements: Height: 5\' 10"  (177.8 cm) Weight: 202 lb 2.6 oz (91.7 kg) IBW/kg (Calculated) : 73 Heparin Dosing Weight: 92.5 kg  Vital Signs: Temp: 98.2 F (36.8 C) (03/27 0315) Temp Source: Oral (03/27 0315) BP: 140/80 (03/27 1000) Pulse Rate: 82 (03/27 0315)  Labs: Recent Labs    04/28/19 0040 04/28/19 1041  HGB 13.0  --   HCT 40.4  --   PLT 262  --   LABPROT 12.8  --   INR 1.0  --   HEPARINUNFRC 0.10* 0.38  CREATININE 1.02  --     Estimated Creatinine Clearance: 72.3 mL/min (by C-G formula based on SCr of 1.02 mg/dL).   Assessment: 74 yo M found to have 99% stenosis in LAD, not amendable to stenting. Plan for CABG evaluation - restart heparin 8 hours after sheath removal. No AC PTA.   Heparin level now therapeutic on 1400 units/hr. Noted plans for surgery Monday 3/29.  Goal of Therapy:  Heparin level 0.3-0.7 units/ml Monitor platelets by anticoagulation protocol: Yes   Plan:  Continue Heparin at 1400 units/hr  Heparin level in 6 hours for confirmation Daily heparin level and CBC Monitor for any signs/symptoms of bleeding    Thank you for allowing pharmacy to be a part of this patient's care.  [employer], Pharm.D., BCPS Clinical Pharmacist Clinical phone for 04/28/2019 from 7:30-3:00 is [phone].  **Pharmacist phone directory can be found on [email or web address] listed under [place].  04/28/2019 12:04 PM

## 2019-04-28 NOTE — Progress Notes (Signed)
ANTICOAGULATION CONSULT NOTE - Follow-Up Consult  Pharmacy Consult for heparin Indication: chest pain/ACS  Patient Measurements: Height: 5\' 10"  (177.8 cm) Weight: 204 lb 9.6 oz (92.8 kg) IBW/kg (Calculated) : 73 Heparin Dosing Weight: 92.5 kg  Vital Signs: Temp: 98.2 F (36.8 C) (03/27 0014) Temp Source: Oral (03/27 0014) BP: 157/79 (03/27 0014) Pulse Rate: 92 (03/27 0014)  Labs: Recent Labs    04/25/19 0821 04/28/19 0040  HGB 13.1 13.0  HCT 41.0 40.4  PLT 280 262  LABPROT  --  12.8  INR  --  1.0  HEPARINUNFRC  --  0.10*  CREATININE 0.88 1.02    Estimated Creatinine Clearance: 72.7 mL/min (by C-G formula based on SCr of 1.02 mg/dL).   Assessment: 49 yof found to have 99% stenosis in LAD, not amendable to stenting. Plan for CABG evaluation - restart heparin 8 hours after sheath removal. No AC PTA.   Heparin level early this morning is SUBtherapeutic (HL 0.1, goal of 0.3-0.7). CBC wnl - no bleeding noted.   Goal of Therapy:  Heparin level 0.3-0.7 units/ml Monitor platelets by anticoagulation protocol: Yes   Plan:  - Increase Heparin drip to 1400 units/hr  - Will continue to monitor for any signs/symptoms of bleeding and will follow up with heparin level in 8 hours  Thank you for allowing pharmacy to be a part of this patient's care.  Alycia Rossetti, PharmD, BCPS Clinical Pharmacist 04/28/2019 1:41 AM   **Pharmacist phone directory can now be found on [email or web address] ([email or web address]).  Listed under [place].

## 2019-04-29 ENCOUNTER — Inpatient Hospital Stay (HOSPITAL_COMMUNITY): Payer: No Typology Code available for payment source

## 2019-04-29 ENCOUNTER — Other Ambulatory Visit (HOSPITAL_COMMUNITY): Payer: Self-pay

## 2019-04-29 DIAGNOSIS — Z01810 Encounter for preprocedural cardiovascular examination: Secondary | ICD-10-CM

## 2019-04-29 LAB — CBC
HCT: 43 % (ref 39.0–52.0)
Hemoglobin: 14 g/dL (ref 13.0–17.0)
MCH: 27.5 pg (ref 26.0–34.0)
MCHC: 32.6 g/dL (ref 30.0–36.0)
MCV: 84.5 fL (ref 80.0–100.0)
Platelets: 295 10*3/uL (ref 150–400)
RBC: 5.09 MIL/uL (ref 4.22–5.81)
RDW: 14.6 % (ref 11.5–15.5)
WBC: 9.6 10*3/uL (ref 4.0–10.5)
nRBC: 0 % (ref 0.0–0.2)

## 2019-04-29 LAB — BLOOD GAS, ARTERIAL
Acid-base deficit: 2.2 mmol/L — ABNORMAL HIGH (ref 0.0–2.0)
Bicarbonate: 21.3 mmol/L (ref 20.0–28.0)
FIO2: 21
O2 Saturation: 95.9 %
Patient temperature: 36.7
pCO2 arterial: 31.8 mmHg — ABNORMAL LOW (ref 32.0–48.0)
pH, Arterial: 7.44 (ref 7.350–7.450)
pO2, Arterial: 90.3 mmHg (ref 83.0–108.0)

## 2019-04-29 LAB — TYPE AND SCREEN
ABO/RH(D): O POS
Antibody Screen: NEGATIVE

## 2019-04-29 LAB — HEPARIN LEVEL (UNFRACTIONATED): Heparin Unfractionated: 0.47 IU/mL (ref 0.30–0.70)

## 2019-04-29 LAB — GLUCOSE, CAPILLARY
Glucose-Capillary: 121 mg/dL — ABNORMAL HIGH (ref 70–99)
Glucose-Capillary: 152 mg/dL — ABNORMAL HIGH (ref 70–99)
Glucose-Capillary: 161 mg/dL — ABNORMAL HIGH (ref 70–99)
Glucose-Capillary: 159 mg/dL — ABNORMAL HIGH (ref 70–99)

## 2019-04-29 LAB — ABO/RH: ABO/RH(D): O POS

## 2019-04-29 MED ORDER — PHENYLEPHRINE HCL-NACL 20-0.9 MG/250ML-% IV SOLN
30.0000 ug/min | INTRAVENOUS | Status: AC
  Administered 2019-04-30: 15 ug/min via INTRAVENOUS
  Filled 2019-04-29: qty 250

## 2019-04-29 MED ORDER — SODIUM CHLORIDE 0.9 % IV SOLN
1.5000 g | INTRAVENOUS | Status: AC
  Administered 2019-04-30: .75 g via INTRAVENOUS
  Administered 2019-04-30: 1.5 g via INTRAVENOUS
  Filled 2019-04-29: qty 1.5

## 2019-04-29 MED ORDER — MILRINONE LACTATE IN DEXTROSE 20-5 MG/100ML-% IV SOLN
0.3000 ug/kg/min | INTRAVENOUS | Status: DC
  Filled 2019-04-29: qty 100

## 2019-04-29 MED ORDER — SODIUM CHLORIDE 0.9 % IV SOLN
INTRAVENOUS | Status: DC
  Filled 2019-04-29 (×2): qty 30

## 2019-04-29 MED ORDER — TEMAZEPAM 15 MG PO CAPS: 15.0000 mg | ORAL_CAPSULE | Freq: Once | ORAL | Status: DC | PRN

## 2019-04-29 MED ORDER — EPINEPHRINE HCL 5 MG/250ML IV SOLN IN NS
0.0000 ug/min | INTRAVENOUS | Status: DC
  Filled 2019-04-29: qty 250

## 2019-04-29 MED ORDER — DEXMEDETOMIDINE HCL IN NACL 400 MCG/100ML IV SOLN
0.1000 ug/kg/h | INTRAVENOUS | Status: AC
  Administered 2019-04-30: .4 ug/kg/h via INTRAVENOUS
  Filled 2019-04-29: qty 100

## 2019-04-29 MED ORDER — NITROGLYCERIN IN D5W 200-5 MCG/ML-% IV SOLN
2.0000 ug/min | INTRAVENOUS | Status: AC
  Administered 2019-04-30: 16.6 ug/min via INTRAVENOUS
  Filled 2019-04-29: qty 250

## 2019-04-29 MED ORDER — SODIUM CHLORIDE 0.9 % IV SOLN
750.0000 mg | INTRAVENOUS | Status: DC
  Filled 2019-04-29: qty 750

## 2019-04-29 MED ORDER — CHLORHEXIDINE GLUCONATE 0.12 % MT SOLN
15.0000 mL | Freq: Once | OROMUCOSAL | Status: AC
  Administered 2019-04-30: 15 mL via OROMUCOSAL
  Filled 2019-04-29: qty 15

## 2019-04-29 MED ORDER — BISACODYL 5 MG PO TBEC
5.0000 mg | DELAYED_RELEASE_TABLET | Freq: Once | ORAL | Status: AC
  Administered 2019-04-29: 5 mg via ORAL
  Filled 2019-04-29: qty 1

## 2019-04-29 MED ORDER — MAGNESIUM SULFATE 50 % IJ SOLN
40.0000 meq | INTRAMUSCULAR | Status: DC
  Filled 2019-04-29: qty 9.85

## 2019-04-29 MED ORDER — METOPROLOL TARTRATE 12.5 MG HALF TABLET
12.5000 mg | ORAL_TABLET | Freq: Once | ORAL | Status: AC
  Administered 2019-04-30: 12.5 mg via ORAL
  Filled 2019-04-29: qty 1

## 2019-04-29 MED ORDER — TRANEXAMIC ACID (OHS) PUMP PRIME SOLUTION
2.0000 mg/kg | INTRAVENOUS | Status: DC
  Filled 2019-04-29 (×2): qty 1.82

## 2019-04-29 MED ORDER — POTASSIUM CHLORIDE 2 MEQ/ML IV SOLN
80.0000 meq | INTRAVENOUS | Status: DC
  Filled 2019-04-29 (×2): qty 40

## 2019-04-29 MED ORDER — CHLORHEXIDINE GLUCONATE CLOTH 2 % EX PADS
6.0000 | MEDICATED_PAD | Freq: Once | CUTANEOUS | Status: AC
  Administered 2019-04-29: 6 via TOPICAL

## 2019-04-29 MED ORDER — NOREPINEPHRINE 4 MG/250ML-% IV SOLN
0.0000 ug/min | INTRAVENOUS | Status: DC
  Filled 2019-04-29: qty 250

## 2019-04-29 MED ORDER — CHLORHEXIDINE GLUCONATE CLOTH 2 % EX PADS
6.0000 | MEDICATED_PAD | Freq: Once | CUTANEOUS | Status: AC
  Administered 2019-04-30: 6 via TOPICAL

## 2019-04-29 MED ORDER — TRANEXAMIC ACID (OHS) BOLUS VIA INFUSION
15.0000 mg/kg | INTRAVENOUS | Status: AC
  Administered 2019-04-30: 1363.5 mg via INTRAVENOUS
  Filled 2019-04-29: qty 1364

## 2019-04-29 MED ORDER — TRANEXAMIC ACID 1000 MG/10ML IV SOLN
1.5000 mg/kg/h | INTRAVENOUS | Status: AC
  Administered 2019-04-30: 1.5 mg/kg/h via INTRAVENOUS
  Filled 2019-04-29 (×2): qty 25

## 2019-04-29 MED ORDER — PLASMA-LYTE 148 IV SOLN
INTRAVENOUS | Status: AC
  Administered 2019-04-30: 500 mL
  Filled 2019-04-29 (×2): qty 2.5

## 2019-04-29 MED ORDER — VANCOMYCIN HCL 1500 MG/300ML IV SOLN
1500.0000 mg | INTRAVENOUS | Status: AC
  Administered 2019-04-30: 1500 mg via INTRAVENOUS
  Filled 2019-04-29: qty 300

## 2019-04-29 MED ORDER — INSULIN REGULAR(HUMAN) IN NACL 100-0.9 UT/100ML-% IV SOLN
INTRAVENOUS | Status: AC
  Administered 2019-04-30: 4 [IU]/h via INTRAVENOUS
  Filled 2019-04-29: qty 100

## 2019-04-29 NOTE — Progress Notes (Signed)
Progress Note  Patient Name: Cristian Martin Date of Encounter: 04/29/2019  Primary Cardiologist: [name], MD   Subjective   No complaints  Inpatient Medications    Scheduled Meds: . amLODipine  10 mg Oral Daily  . aspirin EC  81 mg Oral Daily  . carvedilol  6.25 mg Oral BID WC  . cloNIDine  0.2 mg Oral TID  . darifenacin  7.5 mg Oral Daily  . doxazosin  2 mg Oral QHS  . DULoxetine  60 mg Oral Daily  . famotidine  20 mg Oral BID  . insulin aspart protamine- aspart  60 Units Subcutaneous BID AC  . losartan  100 mg Oral Daily  . [START ON 04/30/2019] metFORMIN  1,000 mg Oral Q breakfast  . pantoprazole  40 mg Oral Daily  . rosuvastatin  40 mg Oral Daily  . sodium chloride flush  3 mL Intravenous Q12H   Continuous Infusions: . sodium chloride    . heparin 1,400 Units/hr (04/28/19 1352)   PRN Meds: sodium chloride, acetaminophen, calcium carbonate, ondansetron (ZOFRAN) IV, sodium chloride flush   Vital Signs    Vitals:   04/28/19 1637 04/28/19 2117 04/28/19 2119 04/29/19 0458  BP:  125/70 125/70 119/83  Pulse: 80  75   Resp: 19     Temp: 98.4 F (36.9 C)  98 F (36.7 C) 98.1 F (36.7 C)  TempSrc: Oral  Oral Oral  SpO2: 94%  96%   Weight:    90.9 kg  Height:        Intake/Output Summary (Last 24 hours) at 04/29/2019 0746 Last data filed at 04/28/2019 2120 Gross per 24 hour  Intake --  Output 800 ml  Net -800 ml   Last 3 Weights 04/29/2019 04/28/2019 04/27/2019  Weight (lbs) 200 lb 4.8 oz 202 lb 2.6 oz 204 lb 9.6 oz  Weight (kg) 90.855 kg 91.7 kg 92.806 kg      Telemetry    NSR - Personally Reviewed  ECG    n/a - Personally Reviewed  Physical Exam   GEN: No acute distress.   Neck: No JVD Cardiac: RRR, no murmurs, rubs, or gallops.  Respiratory: Clear to auscultation bilaterally. GI: Soft, nontender, non-distended  MS: No edema; No deformity. Neuro:  Nonfocal  Psych: Normal affect   Labs    High Sensitivity Troponin:  No results for  input(s): TROPONINIHS in the last 720 hours.    Chemistry Recent Labs  Lab 04/24/19 1430 04/25/19 0821 04/28/19 0040  NA 142 139 137  K 7.9* 4.7 4.1  CL 104 104 100  CO2 20 25 27   GLUCOSE 44* 191* 135*  BUN 17 18 14   CREATININE 0.95 0.88 1.02  CALCIUM 5.8* 9.1 9.3  PROT  --   --  6.8  ALBUMIN  --   --  3.7  AST  --   --  27  ALT  --   --  33  ALKPHOS  --   --  61  BILITOT  --   --  0.8  GFRNONAA 79 >60 >60  GFRAA 91 >60 >60  ANIONGAP  --  10 10     Hematology Recent Labs  Lab 04/24/19 1430 04/25/19 0821 04/28/19 0040  WBC 10.6 8.0 8.4  RBC 4.92 4.77 4.76  HGB 13.5 13.1 13.0  HCT 41.0 41.0 40.4  MCV 83 86.0 84.9  MCH 27.4 27.5 27.3  MCHC 32.9 32.0 32.2  RDW 14.6 14.8 14.4  PLT 312 280 262  BNPNo results for input(s): BNP, PROBNP in the last 168 hours.   DDimer No results for input(s): DDIMER in the last 168 hours.   Radiology    CARDIAC CATHETERIZATION  Result Date: 04/27/2019  Dist LM to Ost LAD lesion is 99% stenosed.  Ost Cx to Prox Cx lesion is 50% stenosed.  Prox RCA lesion is 70% stenosed.  The left ventricular systolic function is normal.  LV end diastolic pressure is mildly elevated.  The left ventricular ejection fraction is 55-65% by visual estimate.  Prox LAD to Mid LAD lesion is 80% stenosed.  2nd Diag lesion is 70% stenosed.  1.  Left dominant coronary arteries with severe ostial LAD stenosis which is heavily calcified.  Moderate ostial left circumflex stenosis.  Significant disease in the proximal right coronary artery but the vessel is small and nondominant. 2.  Normal LV systolic function and mildly elevated left ventricular end-diastolic pressure. Recommendations: The ostial LAD plaque extends somewhat into the distal left main.  It would be impossible to stent the LAD without jailing the left circumflex.  Due to this and given that the patient is diabetic, I think CABG is a better option.  The patient is not on a P2 Y 12 inhibitor. He  started having chest pain at the end of diagnostic cath and was given fentanyl.  Given the severe stenosis in the LAD, recommend hospital admission and starting heparin drip 8 hours after sheath pull. We consulted cardiothoracic surgery for CABG. I will try to gradually increase his carvedilol dose and see if we can wean him off clonidine.   ECHOCARDIOGRAM COMPLETE  Result Date: 04/27/2019    ECHOCARDIOGRAM REPORT   Patient Name:   [name] Date of Exam: 04/27/2019 Medical Rec #:  [id number]      Height:       70.0 in Accession #:    [id number]     Weight:       210.0 lb Date of Birth:  November 17, 1945      BSA:          2.131 m Patient Age:    74 years       BP:           138/86 mmHg Patient Gender: M              HR:           79 bpm. Exam Location:  Inpatient Procedure: 2D Echo, Cardiac Doppler and Color Doppler Indications:    Chest pain 786.50/R07.9  History:        Patient has no prior history of Echocardiogram examinations.                 Risk Factors:Hypertension, Diabetes and Dyslipidemia. Unstable                 angina. GERD.  Sonographer:    Clayton Lefort RDCS (AE) Referring Phys: [id number] Lilia Argue GERHARDT IMPRESSIONS  1. Left ventricular ejection fraction, by estimation, is 55 to 60%. The left ventricle has normal function. The left ventricle has no regional wall motion abnormalities. There is moderate concentric left ventricular hypertrophy. Left ventricular diastolic parameters are consistent with Grade I diastolic dysfunction (impaired relaxation).  2. Right ventricular systolic function is normal. The right ventricular size is normal. Tricuspid regurgitation signal is inadequate for assessing PA pressure.  3. The mitral valve is grossly normal. No evidence of mitral valve regurgitation. No evidence of mitral stenosis.  4. The aortic valve  is tricuspid. Aortic valve regurgitation is not visualized. Mild aortic valve sclerosis is present, with no evidence of aortic valve stenosis.  5. The inferior vena  cava is normal in size with greater than 50% respiratory variability, suggesting right atrial pressure of 3 mmHg. FINDINGS  Left Ventricle: Left ventricular ejection fraction, by estimation, is 55 to 60%. The left ventricle has normal function. The left ventricle has no regional wall motion abnormalities. The left ventricular internal cavity size was normal in size. There is  moderate concentric left ventricular hypertrophy. Left ventricular diastolic parameters are consistent with Grade I diastolic dysfunction (impaired relaxation). Right Ventricle: The right ventricular size is normal. No increase in right ventricular wall thickness. Right ventricular systolic function is normal. Tricuspid regurgitation signal is inadequate for assessing PA pressure. Left Atrium: Left atrial size was normal in size. Right Atrium: Right atrial size was normal in size. Pericardium: There is no evidence of pericardial effusion. Mitral Valve: The mitral valve is grossly normal. No evidence of mitral valve regurgitation. No evidence of mitral valve stenosis. MV peak gradient, 3.2 mmHg. The mean mitral valve gradient is 1.0 mmHg. Tricuspid Valve: The tricuspid valve is grossly normal. Tricuspid valve regurgitation is not demonstrated. No evidence of tricuspid stenosis. Aortic Valve: The aortic valve is tricuspid. Aortic valve regurgitation is not visualized. Mild aortic valve sclerosis is present, with no evidence of aortic valve stenosis. Aortic valve mean gradient measures 5.0 mmHg. Aortic valve peak gradient measures 8.2 mmHg. Aortic valve area, by VTI measures 2.68 cm. Pulmonic Valve: The pulmonic valve was grossly normal. Pulmonic valve regurgitation is not visualized. No evidence of pulmonic stenosis. Aorta: The aortic root is normal in size and structure. Venous: The inferior vena cava is normal in size with greater than 50% respiratory variability, suggesting right atrial pressure of 3 mmHg. IAS/Shunts: No atrial level shunt  detected by color flow Doppler.  LEFT VENTRICLE PLAX 2D LVIDd:         4.50 cm  Diastology LVIDs:         3.30 cm  LV e' lateral:   8.05 cm/s LV PW:         1.30 cm  LV E/e' lateral: 9.9 LV IVS:        1.47 cm  LV e' medial:    6.85 cm/s LVOT diam:     2.20 cm  LV E/e' medial:  11.6 LV SV:         78 LV SV Index:   37 LVOT Area:     3.80 cm  RIGHT VENTRICLE             IVC RV Basal diam:  3.00 cm     IVC diam: 1.90 cm RV S prime:     13.90 cm/s TAPSE (M-mode): 1.8 cm LEFT ATRIUM           Index       RIGHT ATRIUM           Index LA diam:      3.30 cm 1.55 cm/m  RA Area:     10.20 cm LA Vol (A2C): 42.2 ml 19.80 ml/m RA Volume:   19.10 ml  8.96 ml/m LA Vol (A4C): 53.2 ml 24.96 ml/m  AORTIC VALVE AV Area (Vmax):    2.36 cm AV Area (Vmean):   2.42 cm AV Area (VTI):     2.68 cm AV Vmax:           143.00 cm/s AV Vmean:  104.000 cm/s AV VTI:            0.292 m AV Peak Grad:      8.2 mmHg AV Mean Grad:      5.0 mmHg LVOT Vmax:         88.80 cm/s LVOT Vmean:        66.100 cm/s LVOT VTI:          0.206 m LVOT/AV VTI ratio: 0.71  AORTA Ao Root diam: 3.50 cm Ao Asc diam:  3.60 cm MITRAL VALVE MV Area (PHT): 4.80 cm    SHUNTS MV Peak grad:  3.2 mmHg    Systemic VTI:  0.21 m MV Mean grad:  1.0 mmHg    Systemic Diam: 2.20 cm MV Vmax:       0.89 m/s MV Vmean:      52.0 cm/s MV Decel Time: 158 msec MV E velocity: 79.30 cm/s MV A velocity: 83.60 cm/s MV E/A ratio:  0.95 Eleonore Chiquito MD Electronically signed by Eleonore Chiquito MD Signature Date/Time: 04/27/2019/4:17:24 PM    Final     Cardiac Studies    Patient Profile     74 y.o. male history of DM2, HTN, HL, prostate CAD s/p radiation seed implants with chest pain. Presented for outpatient cath, admitted with unstable angina  Assessment & Plan    1. Chest pain/CAD/Unstable angina - seen by Dr Fletcher Anon as outpatient 3/23 with chest pain - high risk nuclear stress suggesting prox LAD stenosis - outpatient cath 3/26 with distal LM/ostial LAD 99%, prox to mid  LAD 80%, D2 70%, LCX 50%, RCA 70%. LVEDP 18 - 04/2019 echo LVEF 55-60%, no WMAs, grade I DDX - CT surgery consulted for CABG, plan for surgery on Monday  - medical therapy with ASA 81, coreg 6.25mg  bid, hep gtt, losartan 100, crestor 40 - no changes today, plan for CABG tomorrow  2. HTN - on multiple agents at home.  - work on weaning clonidine over time and optimizing other bp meds with further cardiac benefits after surgery. Would not make any changes today with surgery planned tomorrow     For questions or updates, please contact [place] Please consult [email or web address] for contact info under        Signed, Carlyle Dolly, MD  04/29/2019, 7:46 AM

## 2019-04-29 NOTE — Anesthesia Preprocedure Evaluation (Addendum)
Anesthesia Evaluation  Patient identified by MRN, date of birth, ID band Patient awake    Reviewed: Allergy & Precautions, NPO status , Patient's Chart, lab work & pertinent test results  Airway Mallampati: II  TM Distance: >3 FB Neck ROM: Full    Dental  (+) Missing   Pulmonary neg pulmonary ROS,    breath sounds clear to auscultation       Cardiovascular hypertension, Pt. on medications + angina + CAD   Rhythm:Regular Rate:Normal  Normal EF. Valves okay   Neuro/Psych  Neuromuscular disease    GI/Hepatic Neg liver ROS, GERD  ,  Endo/Other  diabetes, Type 2  Renal/GU Renal disease     Musculoskeletal   Abdominal   Peds  Hematology negative hematology ROS (+)   Anesthesia Other Findings   Reproductive/Obstetrics                            Lab Results  Component Value Date   WBC 9.6 04/29/2019   HGB 14.0 04/29/2019   HCT 43.0 04/29/2019   MCV 84.5 04/29/2019   PLT 295 04/29/2019   Lab Results  Component Value Date   CREATININE 1.02 04/28/2019   BUN 14 04/28/2019   NA 137 04/28/2019   K 4.1 04/28/2019   CL 100 04/28/2019   CO2 27 04/28/2019    Anesthesia Physical Anesthesia Plan  ASA: IV  Anesthesia Plan: General   Post-op Pain Management:    Induction: Intravenous  PONV Risk Score and Plan: 2 and Treatment may vary due to age or medical condition  Airway Management Planned: Oral ETT  Additional Equipment: Arterial line, CVP, PA Cath, Ultrasound Guidance Line Placement and TEE  Intra-op Plan:   Post-operative Plan: Post-operative intubation/ventilation  Informed Consent: I have reviewed the patients History and Physical, chart, labs and discussed the procedure including the risks, benefits and alternatives for the proposed anesthesia with the patient or authorized representative who has indicated his/her understanding and acceptance.     Dental advisory  given  Plan Discussed with: CRNA  Anesthesia Plan Comments:        Anesthesia Quick Evaluation

## 2019-04-29 NOTE — Progress Notes (Signed)
VASCULAR LAB PRELIMINARY  PRELIMINARY  PRELIMINARY  PRELIMINARY  Pre CABG Dopplers completed.    Preliminary report:  See CV proc for preliminary results.   Sparrow [place], RVT 04/29/2019, 11:53 AM

## 2019-04-29 NOTE — Plan of Care (Signed)
  Problem: Education: Goal: Knowledge of General Education information will improve Description: Including pain rating scale, medication(s)/side effects and non-pharmacologic comfort measures Outcome: Progressing   Problem: Cardiovascular: Goal: Ability to achieve and maintain adequate cardiovascular perfusion will improve Outcome: Progressing   Elesa Hacker, RN

## 2019-04-29 NOTE — Progress Notes (Signed)
ANTICOAGULATION CONSULT NOTE - Follow-Up Consult  Pharmacy Consult for heparin Indication: chest pain/ACS  Patient Measurements: Height: 5\' 10"  (177.8 cm) Weight: 200 lb 4.8 oz (90.9 kg) IBW/kg (Calculated) : 73 Heparin Dosing Weight: 92.5 kg  Vital Signs: Temp: 98.1 F (36.7 C) (03/28 0458) Temp Source: Oral (03/28 0458) BP: 132/76 (03/28 0755)  Labs: Recent Labs    04/28/19 0040 04/28/19 0040 04/28/19 1041 04/28/19 1623 04/29/19 0808  HGB 13.0  --   --   --  14.0  HCT 40.4  --   --   --  43.0  PLT 262  --   --   --  295  LABPROT 12.8  --   --   --   --   INR 1.0  --   --   --   --   HEPARINUNFRC 0.10*   < > 0.38 0.39 0.47  CREATININE 1.02  --   --   --   --    < > = values in this interval not displayed.    Estimated Creatinine Clearance: 72.1 mL/min (by C-G formula based on SCr of 1.02 mg/dL).   Assessment: 74 yo M found to have 99% stenosis in LAD, not amendable to stenting. Plan for CABG evaluation - restart heparin 8 hours after sheath removal. No AC PTA.   Heparin level remains therapeutic on 1400 units/hr. Noted plans for surgery Monday 3/29.  Goal of Therapy:  Heparin level 0.3-0.7 units/ml Monitor platelets by anticoagulation protocol: Yes   Plan:  Continue Heparin at 1400 units/hr  Daily heparin level and CBC Monitor for any signs/symptoms of bleeding    [employer], Pharm.D., BCPS Clinical Pharmacist Clinical phone for 04/29/2019 from 7:30-3:00 is [phone].  **Pharmacist phone directory can be found on [email or web address] listed under [place].  04/29/2019 9:32 AM

## 2019-04-30 ENCOUNTER — Inpatient Hospital Stay (HOSPITAL_COMMUNITY): Payer: No Typology Code available for payment source

## 2019-04-30 ENCOUNTER — Inpatient Hospital Stay (HOSPITAL_COMMUNITY)
Admission: RE | Disposition: A | Discharge status: Home / Self Care | Payer: Self-pay | Source: Ambulatory Visit | Attending: Cardiothoracic Surgery

## 2019-04-30 ENCOUNTER — Encounter (HOSPITAL_COMMUNITY): Payer: Self-pay | Admitting: Cardiovascular Disease

## 2019-04-30 ENCOUNTER — Inpatient Hospital Stay (HOSPITAL_COMMUNITY): Payer: No Typology Code available for payment source | Admitting: Certified Registered"

## 2019-04-30 DIAGNOSIS — I2510 Atherosclerotic heart disease of native coronary artery without angina pectoris: Secondary | ICD-10-CM | POA: Diagnosis present

## 2019-04-30 DIAGNOSIS — I25110 Atherosclerotic heart disease of native coronary artery with unstable angina pectoris: Secondary | ICD-10-CM

## 2019-04-30 DIAGNOSIS — Z951 Presence of aortocoronary bypass graft: Secondary | ICD-10-CM

## 2019-04-30 HISTORY — PX: CORONARY ARTERY BYPASS GRAFT: SHX141

## 2019-04-30 HISTORY — PX: TEE WITHOUT CARDIOVERSION: SHX5443

## 2019-04-30 LAB — POCT I-STAT 7, (LYTES, BLD GAS, ICA,H+H)
Acid-base deficit: 5 mmol/L — ABNORMAL HIGH (ref 0.0–2.0)
Acid-base deficit: 4 mmol/L — ABNORMAL HIGH (ref 0.0–2.0)
Acid-base deficit: 4 mmol/L — ABNORMAL HIGH (ref 0.0–2.0)
Acid-base deficit: 3 mmol/L — ABNORMAL HIGH (ref 0.0–2.0)
Bicarbonate: 21.5 mmol/L (ref 20.0–28.0)
Bicarbonate: 22.3 mmol/L (ref 20.0–28.0)
Bicarbonate: 20.9 mmol/L (ref 20.0–28.0)
Bicarbonate: 21.9 mmol/L (ref 20.0–28.0)
Calcium, Ion: 1.25 mmol/L (ref 1.15–1.40)
Calcium, Ion: 1.34 mmol/L (ref 1.15–1.40)
Calcium, Ion: 1.25 mmol/L (ref 1.15–1.40)
Calcium, Ion: 1.24 mmol/L (ref 1.15–1.40)
HCT: 39 % (ref 39.0–52.0)
HCT: 38 % — ABNORMAL LOW (ref 39.0–52.0)
HCT: 37 % — ABNORMAL LOW (ref 39.0–52.0)
HCT: 36 % — ABNORMAL LOW (ref 39.0–52.0)
Hemoglobin: 13.3 g/dL (ref 13.0–17.0)
Hemoglobin: 12.9 g/dL — ABNORMAL LOW (ref 13.0–17.0)
Hemoglobin: 12.6 g/dL — ABNORMAL LOW (ref 13.0–17.0)
Hemoglobin: 12.2 g/dL — ABNORMAL LOW (ref 13.0–17.0)
O2 Saturation: 91 %
O2 Saturation: 90 %
O2 Saturation: 93 %
O2 Saturation: 94 %
Patient temperature: 35.8
Patient temperature: 36.4
Patient temperature: 36.8
Patient temperature: 37.2
Potassium: 4.5 mmol/L (ref 3.5–5.1)
Potassium: 4.3 mmol/L (ref 3.5–5.1)
Potassium: 4.3 mmol/L (ref 3.5–5.1)
Potassium: 4.7 mmol/L (ref 3.5–5.1)
Sodium: 140 mmol/L (ref 135–145)
Sodium: 136 mmol/L (ref 135–145)
Sodium: 141 mmol/L (ref 135–145)
Sodium: 139 mmol/L (ref 135–145)
TCO2: 23 mmol/L (ref 22–32)
TCO2: 24 mmol/L (ref 22–32)
TCO2: 22 mmol/L (ref 22–32)
TCO2: 23 mmol/L (ref 22–32)
pCO2 arterial: 40.6 mmHg (ref 32.0–48.0)
pCO2 arterial: 42.5 mmHg (ref 32.0–48.0)
pCO2 arterial: 37.9 mmHg (ref 32.0–48.0)
pCO2 arterial: 38.3 mmHg (ref 32.0–48.0)
pH, Arterial: 7.327 — ABNORMAL LOW (ref 7.350–7.450)
pH, Arterial: 7.324 — ABNORMAL LOW (ref 7.350–7.450)
pH, Arterial: 7.348 — ABNORMAL LOW (ref 7.350–7.450)
pH, Arterial: 7.365 (ref 7.350–7.450)
pO2, Arterial: 61 mmHg — ABNORMAL LOW (ref 83.0–108.0)
pO2, Arterial: 62 mmHg — ABNORMAL LOW (ref 83.0–108.0)
pO2, Arterial: 68 mmHg — ABNORMAL LOW (ref 83.0–108.0)
pO2, Arterial: 73 mmHg — ABNORMAL LOW (ref 83.0–108.0)

## 2019-04-30 LAB — BASIC METABOLIC PANEL
Anion gap: 11 (ref 5–15)
Anion gap: 8 (ref 5–15)
BUN: 20 mg/dL (ref 8–23)
BUN: 16 mg/dL (ref 8–23)
CO2: 24 mmol/L (ref 22–32)
CO2: 21 mmol/L — ABNORMAL LOW (ref 22–32)
Calcium: 8.9 mg/dL (ref 8.9–10.3)
Calcium: 8.3 mg/dL — ABNORMAL LOW (ref 8.9–10.3)
Chloride: 103 mmol/L (ref 98–111)
Chloride: 108 mmol/L (ref 98–111)
Creatinine, Ser: 1.28 mg/dL — ABNORMAL HIGH (ref 0.61–1.24)
Creatinine, Ser: 0.95 mg/dL (ref 0.61–1.24)
GFR calc Af Amer: >60 mL/min (ref >60)
GFR calc Af Amer: >60 mL/min (ref >60)
GFR calc non Af Amer: 55 mL/min — ABNORMAL LOW (ref >60)
GFR calc non Af Amer: >60 mL/min (ref >60)
Glucose, Bld: 211 mg/dL — ABNORMAL HIGH (ref 70–99)
Glucose, Bld: 146 mg/dL — ABNORMAL HIGH (ref 70–99)
Potassium: 4.2 mmol/L (ref 3.5–5.1)
Potassium: 4.9 mmol/L (ref 3.5–5.1)
Sodium: 138 mmol/L (ref 135–145)
Sodium: 137 mmol/L (ref 135–145)

## 2019-04-30 LAB — CBC
HCT: 41.4 % (ref 39.0–52.0)
HCT: 41.4 % (ref 39.0–52.0)
HCT: 38.6 % — ABNORMAL LOW (ref 39.0–52.0)
Hemoglobin: 13 g/dL (ref 13.0–17.0)
Hemoglobin: 13 g/dL (ref 13.0–17.0)
Hemoglobin: 12.3 g/dL — ABNORMAL LOW (ref 13.0–17.0)
MCH: 27.3 pg (ref 26.0–34.0)
MCH: 27.5 pg (ref 26.0–34.0)
MCH: 27.2 pg (ref 26.0–34.0)
MCHC: 31.4 g/dL (ref 30.0–36.0)
MCHC: 31.4 g/dL (ref 30.0–36.0)
MCHC: 31.9 g/dL (ref 30.0–36.0)
MCV: 87 fL (ref 80.0–100.0)
MCV: 87.5 fL (ref 80.0–100.0)
MCV: 85.2 fL (ref 80.0–100.0)
Platelets: 268 10*3/uL (ref 150–400)
Platelets: 227 10*3/uL (ref 150–400)
Platelets: 211 10*3/uL (ref 150–400)
RBC: 4.76 MIL/uL (ref 4.22–5.81)
RBC: 4.73 MIL/uL (ref 4.22–5.81)
RBC: 4.53 MIL/uL (ref 4.22–5.81)
RDW: 14.6 % (ref 11.5–15.5)
RDW: 14.6 % (ref 11.5–15.5)
RDW: 14.5 % (ref 11.5–15.5)
WBC: 7.6 10*3/uL (ref 4.0–10.5)
WBC: 6.5 10*3/uL (ref 4.0–10.5)
WBC: 15.5 10*3/uL — ABNORMAL HIGH (ref 4.0–10.5)
nRBC: 0 % (ref 0.0–0.2)
nRBC: 0 % (ref 0.0–0.2)
nRBC: 0 % (ref 0.0–0.2)

## 2019-04-30 LAB — GLUCOSE, CAPILLARY
Glucose-Capillary: 153 mg/dL — ABNORMAL HIGH (ref 70–99)
Glucose-Capillary: 143 mg/dL — ABNORMAL HIGH (ref 70–99)
Glucose-Capillary: 130 mg/dL — ABNORMAL HIGH (ref 70–99)
Glucose-Capillary: 111 mg/dL — ABNORMAL HIGH (ref 70–99)
Glucose-Capillary: 116 mg/dL — ABNORMAL HIGH (ref 70–99)
Glucose-Capillary: 143 mg/dL — ABNORMAL HIGH (ref 70–99)
Glucose-Capillary: 154 mg/dL — ABNORMAL HIGH (ref 70–99)
Glucose-Capillary: 160 mg/dL — ABNORMAL HIGH (ref 70–99)
Glucose-Capillary: 153 mg/dL — ABNORMAL HIGH (ref 70–99)

## 2019-04-30 LAB — ECHO INTRAOPERATIVE TEE
Height: 70 in
Weight: 3203.2 oz

## 2019-04-30 LAB — APTT
aPTT: 62 seconds — ABNORMAL HIGH (ref 24–36)
aPTT: 30 seconds (ref 24–36)

## 2019-04-30 LAB — HEPARIN LEVEL (UNFRACTIONATED): Heparin Unfractionated: 0.36 IU/mL (ref 0.30–0.70)

## 2019-04-30 LAB — HEMOGLOBIN AND HEMATOCRIT, BLOOD
HCT: 29.3 % — ABNORMAL LOW (ref 39.0–52.0)
Hemoglobin: 9.3 g/dL — ABNORMAL LOW (ref 13.0–17.0)

## 2019-04-30 LAB — PLATELET COUNT: Platelets: 191 10*3/uL (ref 150–400)

## 2019-04-30 LAB — PROTIME-INR
INR: 1.3 — ABNORMAL HIGH (ref 0.8–1.2)
Prothrombin Time: 15.8 seconds — ABNORMAL HIGH (ref 11.4–15.2)

## 2019-04-30 LAB — MAGNESIUM: Magnesium: 2.5 mg/dL — ABNORMAL HIGH (ref 1.7–2.4)

## 2019-04-30 SURGERY — CORONARY ARTERY BYPASS GRAFTING (CABG)
Anesthesia: General | Site: Chest

## 2019-04-30 MED ORDER — FENTANYL CITRATE (PF) 250 MCG/5ML IJ SOLN
INTRAMUSCULAR | Status: DC | PRN
  Administered 2019-04-30: 50 ug via INTRAVENOUS
  Administered 2019-04-30 (×3): 150 ug via INTRAVENOUS
  Administered 2019-04-30: 200 ug via INTRAVENOUS
  Administered 2019-04-30: 100 ug via INTRAVENOUS
  Administered 2019-04-30: 250 ug via INTRAVENOUS
  Administered 2019-04-30 (×3): 150 ug via INTRAVENOUS

## 2019-04-30 MED ORDER — PANTOPRAZOLE SODIUM 40 MG PO TBEC
40.0000 mg | DELAYED_RELEASE_TABLET | Freq: Every day | ORAL | Status: DC
  Filled 2019-04-30: qty 1

## 2019-04-30 MED ORDER — CHLORHEXIDINE GLUCONATE CLOTH 2 % EX PADS
6.0000 | MEDICATED_PAD | Freq: Every day | CUTANEOUS | Status: DC
  Administered 2019-04-30: 6 via TOPICAL

## 2019-04-30 MED ORDER — HYDROCORTISONE NA SUCCINATE PF 100 MG IJ SOLR
INTRAMUSCULAR | Status: DC | PRN
  Administered 2019-04-30: 250 mg via INTRAVENOUS

## 2019-04-30 MED ORDER — HEPARIN SODIUM (PORCINE) 1000 UNIT/ML IJ SOLN
INTRAMUSCULAR | Status: AC
  Filled 2019-04-30: qty 1

## 2019-04-30 MED ORDER — LIDOCAINE 2% (20 MG/ML) 5 ML SYRINGE
INTRAMUSCULAR | Status: AC
  Filled 2019-04-30: qty 5

## 2019-04-30 MED ORDER — ALBUMIN HUMAN 5 % IV SOLN
250.0000 mL | INTRAVENOUS | Status: AC | PRN
  Administered 2019-04-30 (×4): 12.5 g via INTRAVENOUS
  Filled 2019-04-30 (×2): qty 250

## 2019-04-30 MED ORDER — ROCURONIUM BROMIDE 10 MG/ML (PF) SYRINGE
PREFILLED_SYRINGE | INTRAVENOUS | Status: DC | PRN
  Administered 2019-04-30: 50 mg via INTRAVENOUS
  Administered 2019-04-30: 100 mg via INTRAVENOUS

## 2019-04-30 MED ORDER — DARIFENACIN HYDROBROMIDE ER 7.5 MG PO TB24
7.5000 mg | ORAL_TABLET | Freq: Every day | ORAL | Status: DC
  Administered 2019-05-01 – 2019-05-04 (×3): 7.5 mg via ORAL
  Filled 2019-04-30 (×5): qty 1

## 2019-04-30 MED ORDER — CALCIUM CHLORIDE 10 % IV SOLN
INTRAVENOUS | Status: DC | PRN
  Administered 2019-04-30: 200 mg via INTRAVENOUS

## 2019-04-30 MED ORDER — ACETAMINOPHEN 500 MG PO TABS
1000.0000 mg | ORAL_TABLET | Freq: Four times a day (QID) | ORAL | Status: DC
  Administered 2019-04-30 – 2019-05-02 (×6): 1000 mg via ORAL
  Filled 2019-04-30 (×6): qty 2

## 2019-04-30 MED ORDER — PROPOFOL 10 MG/ML IV BOLUS
INTRAVENOUS | Status: DC | PRN
  Administered 2019-04-30: 60 mg via INTRAVENOUS

## 2019-04-30 MED ORDER — DOPAMINE-DEXTROSE 3.2-5 MG/ML-% IV SOLN: 0.0000 ug/kg/min | INTRAVENOUS | Status: DC

## 2019-04-30 MED ORDER — HYDROCORTISONE NA SUCCINATE PF 250 MG IJ SOLR
INTRAMUSCULAR | Status: AC
  Filled 2019-04-30: qty 250

## 2019-04-30 MED ORDER — BISACODYL 5 MG PO TBEC
10.0000 mg | DELAYED_RELEASE_TABLET | Freq: Every day | ORAL | Status: DC
  Administered 2019-05-01: 10 mg via ORAL
  Filled 2019-04-30: qty 2

## 2019-04-30 MED ORDER — SODIUM CHLORIDE 0.9 % IV SOLN: INTRAVENOUS | Status: DC

## 2019-04-30 MED ORDER — DOPAMINE-DEXTROSE 1.6-5 MG/ML-% IV SOLN: INTRAVENOUS | Status: DC | PRN

## 2019-04-30 MED ORDER — ALBUMIN HUMAN 5 % IV SOLN: INTRAVENOUS | Status: DC | PRN

## 2019-04-30 MED ORDER — MIDAZOLAM HCL 5 MG/5ML IJ SOLN
INTRAMUSCULAR | Status: DC | PRN
  Administered 2019-04-30 (×5): 2 mg via INTRAVENOUS

## 2019-04-30 MED ORDER — LACTATED RINGERS IV SOLN: INTRAVENOUS | Status: DC

## 2019-04-30 MED ORDER — HEPARIN SODIUM (PORCINE) 1000 UNIT/ML IJ SOLN
INTRAMUSCULAR | Status: DC | PRN
  Administered 2019-04-30: 32000 [IU] via INTRAVENOUS

## 2019-04-30 MED ORDER — DEXMEDETOMIDINE HCL IN NACL 400 MCG/100ML IV SOLN: 0.0000 ug/kg/h | INTRAVENOUS | Status: DC

## 2019-04-30 MED ORDER — SODIUM CHLORIDE 0.45 % IV SOLN: INTRAVENOUS | Status: DC | PRN

## 2019-04-30 MED ORDER — OXYCODONE HCL 5 MG PO TABS
5.0000 mg | ORAL_TABLET | ORAL | Status: DC | PRN
  Administered 2019-05-01: 5 mg via ORAL
  Filled 2019-04-30 (×2): qty 1

## 2019-04-30 MED ORDER — ORAL CARE MOUTH RINSE
15.0000 mL | Freq: Two times a day (BID) | OROMUCOSAL | Status: DC
  Administered 2019-04-30 – 2019-05-03 (×6): 15 mL via OROMUCOSAL

## 2019-04-30 MED ORDER — PROPOFOL 10 MG/ML IV BOLUS
INTRAVENOUS | Status: AC
  Filled 2019-04-30: qty 40

## 2019-04-30 MED ORDER — METOPROLOL TARTRATE 25 MG/10 ML ORAL SUSPENSION: 12.5000 mg | Freq: Two times a day (BID) | ORAL | Status: DC

## 2019-04-30 MED ORDER — ONDANSETRON HCL 4 MG/2ML IJ SOLN
4.0000 mg | Freq: Four times a day (QID) | INTRAMUSCULAR | Status: DC | PRN
  Filled 2019-04-30: qty 2

## 2019-04-30 MED ORDER — ACETAMINOPHEN 160 MG/5ML PO SOLN: 650.0000 mg | Freq: Once | ORAL | Status: AC

## 2019-04-30 MED ORDER — BISACODYL 10 MG RE SUPP
10.0000 mg | Freq: Every day | RECTAL | Status: DC
  Filled 2019-04-30: qty 1

## 2019-04-30 MED ORDER — LACTATED RINGERS IV SOLN: 500.0000 mL | Freq: Once | INTRAVENOUS | Status: DC | PRN

## 2019-04-30 MED ORDER — CHLORHEXIDINE GLUCONATE 0.12% ORAL RINSE (MEDLINE KIT)
15.0000 mL | Freq: Two times a day (BID) | OROMUCOSAL | Status: DC
  Administered 2019-04-30: 15 mL via OROMUCOSAL

## 2019-04-30 MED ORDER — CALCIUM CHLORIDE 10 % IV SOLN
INTRAVENOUS | Status: AC
  Filled 2019-04-30: qty 10

## 2019-04-30 MED ORDER — ROCURONIUM BROMIDE 10 MG/ML (PF) SYRINGE
PREFILLED_SYRINGE | INTRAVENOUS | Status: AC
  Filled 2019-04-30: qty 10

## 2019-04-30 MED ORDER — TRAMADOL HCL 50 MG PO TABS
50.0000 mg | ORAL_TABLET | Freq: Four times a day (QID) | ORAL | Status: DC | PRN
  Administered 2019-05-01 (×3): 50 mg via ORAL
  Filled 2019-04-30 (×3): qty 1

## 2019-04-30 MED ORDER — INSULIN REGULAR(HUMAN) IN NACL 100-0.9 UT/100ML-% IV SOLN: INTRAVENOUS | Status: DC

## 2019-04-30 MED ORDER — HEMOSTATIC AGENTS (NO CHARGE) OPTIME
TOPICAL | Status: DC | PRN
  Administered 2019-04-30: 1 via TOPICAL

## 2019-04-30 MED ORDER — FAMOTIDINE IN NACL 20-0.9 MG/50ML-% IV SOLN
20.0000 mg | Freq: Two times a day (BID) | INTRAVENOUS | Status: AC
  Administered 2019-04-30 (×2): 20 mg via INTRAVENOUS
  Filled 2019-04-30: qty 50

## 2019-04-30 MED ORDER — POTASSIUM CHLORIDE 10 MEQ/50ML IV SOLN: 10.0000 meq | INTRAVENOUS | Status: AC

## 2019-04-30 MED ORDER — PHENYLEPHRINE HCL-NACL 20-0.9 MG/250ML-% IV SOLN: 0.0000 ug/min | INTRAVENOUS | Status: DC

## 2019-04-30 MED ORDER — SODIUM CHLORIDE 0.9% FLUSH
3.0000 mL | Freq: Two times a day (BID) | INTRAVENOUS | Status: DC
  Administered 2019-05-01 (×2): 3 mL via INTRAVENOUS

## 2019-04-30 MED ORDER — EPINEPHRINE 1 MG/10ML IJ SOSY
PREFILLED_SYRINGE | INTRAMUSCULAR | Status: AC
  Filled 2019-04-30: qty 10

## 2019-04-30 MED ORDER — ORAL CARE MOUTH RINSE
15.0000 mL | OROMUCOSAL | Status: DC
  Administered 2019-04-30 (×2): 15 mL via OROMUCOSAL

## 2019-04-30 MED ORDER — 0.9 % SODIUM CHLORIDE (POUR BTL) OPTIME
TOPICAL | Status: DC | PRN
  Administered 2019-04-30: 5000 mL

## 2019-04-30 MED ORDER — ASPIRIN 81 MG PO CHEW: 324.0000 mg | CHEWABLE_TABLET | Freq: Every day | ORAL | Status: DC

## 2019-04-30 MED ORDER — VANCOMYCIN HCL IN DEXTROSE 1-5 GM/200ML-% IV SOLN
1000.0000 mg | Freq: Once | INTRAVENOUS | Status: AC
  Administered 2019-04-30: 1000 mg via INTRAVENOUS
  Filled 2019-04-30: qty 200

## 2019-04-30 MED ORDER — PROTAMINE SULFATE 10 MG/ML IV SOLN
INTRAVENOUS | Status: DC | PRN
  Administered 2019-04-30: 320 mg via INTRAVENOUS

## 2019-04-30 MED ORDER — ROSUVASTATIN CALCIUM 20 MG PO TABS
40.0000 mg | ORAL_TABLET | Freq: Every day | ORAL | Status: DC
  Administered 2019-05-01 – 2019-05-04 (×4): 40 mg via ORAL
  Filled 2019-04-30 (×4): qty 2

## 2019-04-30 MED ORDER — METOPROLOL TARTRATE 5 MG/5ML IV SOLN: 2.5000 mg | INTRAVENOUS | Status: DC | PRN

## 2019-04-30 MED ORDER — PHENYLEPHRINE 40 MCG/ML (10ML) SYRINGE FOR IV PUSH (FOR BLOOD PRESSURE SUPPORT)
PREFILLED_SYRINGE | INTRAVENOUS | Status: AC
  Filled 2019-04-30: qty 10

## 2019-04-30 MED ORDER — FENTANYL CITRATE (PF) 250 MCG/5ML IJ SOLN
INTRAMUSCULAR | Status: AC
  Filled 2019-04-30: qty 25

## 2019-04-30 MED ORDER — ACETAMINOPHEN 650 MG RE SUPP
650.0000 mg | Freq: Once | RECTAL | Status: AC
  Administered 2019-04-30: 650 mg via RECTAL

## 2019-04-30 MED ORDER — MAGNESIUM SULFATE 4 GM/100ML IV SOLN
INTRAVENOUS | Status: AC
  Administered 2019-04-30: 4 g via INTRAVENOUS
  Filled 2019-04-30: qty 100

## 2019-04-30 MED ORDER — FAMOTIDINE IN NACL 20-0.9 MG/50ML-% IV SOLN
INTRAVENOUS | Status: AC
  Filled 2019-04-30: qty 50

## 2019-04-30 MED ORDER — FENTANYL CITRATE (PF) 250 MCG/5ML IJ SOLN
INTRAMUSCULAR | Status: AC
  Filled 2019-04-30: qty 5

## 2019-04-30 MED ORDER — ASPIRIN EC 325 MG PO TBEC
325.0000 mg | DELAYED_RELEASE_TABLET | Freq: Every day | ORAL | Status: DC
  Administered 2019-05-01: 325 mg via ORAL
  Filled 2019-04-30 (×2): qty 1

## 2019-04-30 MED ORDER — CHLORHEXIDINE GLUCONATE 0.12 % MT SOLN
15.0000 mL | OROMUCOSAL | Status: AC
  Administered 2019-04-30: 15 mL via OROMUCOSAL

## 2019-04-30 MED ORDER — MAGNESIUM SULFATE 4 GM/100ML IV SOLN: 4.0000 g | Freq: Once | INTRAVENOUS | Status: AC

## 2019-04-30 MED ORDER — MORPHINE SULFATE (PF) 2 MG/ML IV SOLN
1.0000 mg | INTRAVENOUS | Status: DC | PRN
  Administered 2019-04-30: 2 mg via INTRAVENOUS
  Filled 2019-04-30: qty 1

## 2019-04-30 MED ORDER — DIPHENHYDRAMINE HCL 50 MG/ML IJ SOLN
INTRAMUSCULAR | Status: DC | PRN
  Administered 2019-04-30: 50 mg via INTRAVENOUS

## 2019-04-30 MED ORDER — CHLORHEXIDINE GLUCONATE CLOTH 2 % EX PADS
6.0000 | MEDICATED_PAD | Freq: Every day | CUTANEOUS | Status: DC
  Administered 2019-04-30 – 2019-05-01 (×2): 6 via TOPICAL

## 2019-04-30 MED ORDER — METOPROLOL TARTRATE 12.5 MG HALF TABLET: 12.5000 mg | ORAL_TABLET | Freq: Two times a day (BID) | ORAL | Status: DC

## 2019-04-30 MED ORDER — LACTATED RINGERS IV SOLN: INTRAVENOUS | Status: DC | PRN

## 2019-04-30 MED ORDER — ACETAMINOPHEN 160 MG/5ML PO SOLN: 1000.0000 mg | Freq: Four times a day (QID) | ORAL | Status: DC

## 2019-04-30 MED ORDER — LIDOCAINE 2% (20 MG/ML) 5 ML SYRINGE
INTRAMUSCULAR | Status: DC | PRN
  Administered 2019-04-30: 100 mg via INTRAVENOUS

## 2019-04-30 MED ORDER — SODIUM CHLORIDE 0.9% FLUSH
10.0000 mL | INTRAVENOUS | Status: DC | PRN
  Administered 2019-05-01: 10 mL

## 2019-04-30 MED ORDER — MIDAZOLAM HCL 2 MG/2ML IJ SOLN: 2.0000 mg | INTRAMUSCULAR | Status: DC | PRN

## 2019-04-30 MED ORDER — DEXTROSE 50 % IV SOLN: 0.0000 mL | INTRAVENOUS | Status: DC | PRN

## 2019-04-30 MED ORDER — DOPAMINE-DEXTROSE 3.2-5 MG/ML-% IV SOLN
0.0000 ug/kg/min | INTRAVENOUS | Status: DC
  Administered 2019-04-30: 3 ug/kg/min via INTRAVENOUS
  Filled 2019-04-30: qty 250

## 2019-04-30 MED ORDER — SODIUM CHLORIDE 0.9 % IV SOLN
1.5000 g | Freq: Two times a day (BID) | INTRAVENOUS | Status: AC
  Administered 2019-04-30 – 2019-05-02 (×4): 1.5 g via INTRAVENOUS
  Filled 2019-04-30 (×4): qty 1.5

## 2019-04-30 MED ORDER — MIDAZOLAM HCL (PF) 10 MG/2ML IJ SOLN
INTRAMUSCULAR | Status: AC
  Filled 2019-04-30: qty 2

## 2019-04-30 MED ORDER — SODIUM CHLORIDE 0.9 % IV SOLN: 250.0000 mL | INTRAVENOUS | Status: DC

## 2019-04-30 MED ORDER — PHENYLEPHRINE 40 MCG/ML (10ML) SYRINGE FOR IV PUSH (FOR BLOOD PRESSURE SUPPORT)
PREFILLED_SYRINGE | INTRAVENOUS | Status: DC | PRN
  Administered 2019-04-30 (×5): 80 ug via INTRAVENOUS

## 2019-04-30 MED ORDER — SUCCINYLCHOLINE CHLORIDE 200 MG/10ML IV SOSY
PREFILLED_SYRINGE | INTRAVENOUS | Status: AC
  Filled 2019-04-30: qty 10

## 2019-04-30 MED ORDER — SODIUM CHLORIDE 0.9% FLUSH
10.0000 mL | Freq: Two times a day (BID) | INTRAVENOUS | Status: DC
  Administered 2019-04-30: 10 mL
  Administered 2019-04-30: 20 mL
  Administered 2019-05-01 (×2): 10 mL

## 2019-04-30 MED ORDER — SODIUM CHLORIDE (PF) 0.9 % IJ SOLN: OROMUCOSAL | Status: DC | PRN

## 2019-04-30 MED ORDER — NITROGLYCERIN IN D5W 200-5 MCG/ML-% IV SOLN: 0.0000 ug/min | INTRAVENOUS | Status: DC

## 2019-04-30 MED ORDER — SODIUM CHLORIDE 0.9% FLUSH: 3.0000 mL | INTRAVENOUS | Status: DC | PRN

## 2019-04-30 MED ORDER — DOCUSATE SODIUM 100 MG PO CAPS
200.0000 mg | ORAL_CAPSULE | Freq: Every day | ORAL | Status: DC
  Administered 2019-05-01: 200 mg via ORAL
  Filled 2019-04-30 (×2): qty 2

## 2019-04-30 SURGICAL SUPPLY — 84 items
BAG DECANTER FOR FLEXI CONT (MISCELLANEOUS) ×3 IMPLANT
BLADE CLIPPER SURG (BLADE) ×3 IMPLANT
BLADE STERNUM SYSTEM 6 (BLADE) ×3 IMPLANT
BLADE SURG 11 STRL SS (BLADE) ×3 IMPLANT
BNDG ELASTIC 4X5.8 VLCR STR LF (GAUZE/BANDAGES/DRESSINGS) ×3 IMPLANT
BNDG ELASTIC 6X5.8 VLCR STR LF (GAUZE/BANDAGES/DRESSINGS) ×3 IMPLANT
BNDG GAUZE ELAST 4 BULKY (GAUZE/BANDAGES/DRESSINGS) ×3 IMPLANT
CANISTER SUCT 3000ML PPV (MISCELLANEOUS) ×3 IMPLANT
CATH CPB KIT GERHARDT (MISCELLANEOUS) ×3 IMPLANT
CATH THORACIC 28FR (CATHETERS) ×3 IMPLANT
CNTNR URN SCR LID CUP LEK RST (MISCELLANEOUS) ×2 IMPLANT
CONT SPEC 4OZ STRL OR WHT (MISCELLANEOUS) ×1
DERMABOND ADVANCED (GAUZE/BANDAGES/DRESSINGS) ×1
DERMABOND ADVANCED .7 DNX12 (GAUZE/BANDAGES/DRESSINGS) ×2 IMPLANT
DRAIN CHANNEL 28F RND 3/8 FF (WOUND CARE) ×3 IMPLANT
DRAPE CARDIOVASCULAR INCISE (DRAPES) ×1
DRAPE SLUSH/WARMER DISC (DRAPES) ×3 IMPLANT
DRAPE SRG 135X102X78XABS (DRAPES) ×2 IMPLANT
DRSG AQUACEL AG ADV 3.5X14 (GAUZE/BANDAGES/DRESSINGS) ×3 IMPLANT
ELECT BLADE 4.0 EZ CLEAN MEGAD (MISCELLANEOUS) ×3
ELECT REM PT RETURN 9FT ADLT (ELECTROSURGICAL) ×6
ELECTRODE BLDE 4.0 EZ CLN MEGD (MISCELLANEOUS) ×2 IMPLANT
ELECTRODE REM PT RTRN 9FT ADLT (ELECTROSURGICAL) ×4 IMPLANT
FELT TEFLON 1X6 (MISCELLANEOUS) ×6 IMPLANT
FIBERTAPE STERNAL CLSR 2 36IN (SUTURE) ×6 IMPLANT
FIBERTAPE STERNAL CLSR 2X36 (SUTURE) ×6 IMPLANT
FILTER SMOKE EVAC ULPA (FILTER) ×3 IMPLANT
GAUZE SPONGE 4X4 12PLY STRL (GAUZE/BANDAGES/DRESSINGS) ×6 IMPLANT
GAUZE SPONGE 4X4 12PLY STRL LF (GAUZE/BANDAGES/DRESSINGS) ×6 IMPLANT
GLOVE BIO SURGEON STRL SZ 6.5 (GLOVE) ×9 IMPLANT
GOWN STRL REUS W/ TWL LRG LVL3 (GOWN DISPOSABLE) ×16 IMPLANT
GOWN STRL REUS W/TWL LRG LVL3 (GOWN DISPOSABLE) ×8
HEMOSTAT POWDER SURGIFOAM 1G (HEMOSTASIS) ×9 IMPLANT
HEMOSTAT SURGICEL 2X14 (HEMOSTASIS) ×3 IMPLANT
KIT BASIN OR (CUSTOM PROCEDURE TRAY) ×3 IMPLANT
KIT CATH SUCT 8FR (CATHETERS) ×3 IMPLANT
KIT SUCTION CATH 14FR (SUCTIONS) ×6 IMPLANT
KIT TURNOVER KIT B (KITS) ×3 IMPLANT
KIT VASOVIEW HEMOPRO 2 VH 4000 (KITS) ×3 IMPLANT
LEAD PACING MYOCARDI (MISCELLANEOUS) ×3 IMPLANT
MARKER GRAFT CORONARY BYPASS (MISCELLANEOUS) ×9 IMPLANT
NDL SUT PASSING CERCLAGE MED (SUTURE) ×3
NEEDLE SUT PASSING CERCLAG MED (SUTURE) ×2 IMPLANT
NS IRRIG 1000ML POUR BTL (IV SOLUTION) ×15 IMPLANT
PACK E OPEN HEART (SUTURE) ×3 IMPLANT
PACK OPEN HEART (CUSTOM PROCEDURE TRAY) ×3 IMPLANT
PAD ARMBOARD 7.5X6 YLW CONV (MISCELLANEOUS) ×6 IMPLANT
PAD ELECT DEFIB RADIOL ZOLL (MISCELLANEOUS) ×3 IMPLANT
PENCIL BUTTON HOLSTER BLD 10FT (ELECTRODE) ×3 IMPLANT
PENCIL SMOKE EVACUATOR (MISCELLANEOUS) ×3 IMPLANT
POSITIONER HEAD DONUT 9IN (MISCELLANEOUS) ×3 IMPLANT
PUNCH AORTIC ROTATE  4.5MM 8IN (MISCELLANEOUS) ×3 IMPLANT
SET CARDIOPLEGIA MPS 5001102 (MISCELLANEOUS) ×3 IMPLANT
SET MICROPUNCTURE 5F STIFF (MISCELLANEOUS) ×3 IMPLANT
SLEEVE SUCTION 125 (MISCELLANEOUS) ×3 IMPLANT
SPONGE LAP 18X18 X RAY DECT (DISPOSABLE) ×6 IMPLANT
STOPCOCK 4 WAY LG BORE MALE ST (IV SETS) ×3 IMPLANT
STOPCOCK MORSE 400PSI 3WAY (MISCELLANEOUS) ×3 IMPLANT
SUT BONE WAX W31G (SUTURE) ×3 IMPLANT
SUT MNCRL AB 4-0 PS2 18 (SUTURE) ×3 IMPLANT
SUT PROLENE 3 0 SH1 36 (SUTURE) ×3 IMPLANT
SUT PROLENE 4 0 TF (SUTURE) ×6 IMPLANT
SUT PROLENE 6 0 C 1 30 (SUTURE) ×3 IMPLANT
SUT PROLENE 6 0 CC (SUTURE) ×15 IMPLANT
SUT PROLENE 7 0 BV 1 (SUTURE) ×6 IMPLANT
SUT PROLENE 7 0 BV1 MDA (SUTURE) ×6 IMPLANT
SUT PROLENE 8 0 BV175 6 (SUTURE) ×6 IMPLANT
SUT STEEL 6MS V (SUTURE) ×6 IMPLANT
SUT STEEL SZ 6 DBL 3X14 BALL (SUTURE) ×3 IMPLANT
SUT VIC AB 1 CTX 18 (SUTURE) ×6 IMPLANT
SUT VIC AB 2-0 CT1 27 (SUTURE) ×1
SUT VIC AB 2-0 CT1 TAPERPNT 27 (SUTURE) ×2 IMPLANT
SYSTEM SAHARA CHEST DRAIN ATS (WOUND CARE) ×3 IMPLANT
TAPE CLOTH SURG 4X10 WHT LF (GAUZE/BANDAGES/DRESSINGS) ×6 IMPLANT
TAPE PAPER 2X10 WHT MICROPORE (GAUZE/BANDAGES/DRESSINGS) ×3 IMPLANT
TOWEL GREEN STERILE (TOWEL DISPOSABLE) ×3 IMPLANT
TOWEL GREEN STERILE FF (TOWEL DISPOSABLE) ×3 IMPLANT
TRAP FLUID SMOKE EVACUATOR (MISCELLANEOUS) ×3 IMPLANT
TRAY CATH LUMEN 1 20CM STRL (SET/KITS/TRAYS/PACK) ×3 IMPLANT
TRAY FOLEY SLVR 16FR TEMP STAT (SET/KITS/TRAYS/PACK) ×3 IMPLANT
TUBING ART PRESS 48 MALE/FEM (TUBING) ×6 IMPLANT
TUBING LAP HI FLOW INSUFFLATIO (TUBING) ×3 IMPLANT
UNDERPAD 30X30 (UNDERPADS AND DIAPERS) ×3 IMPLANT
WATER STERILE IRR 1000ML POUR (IV SOLUTION) ×6 IMPLANT

## 2019-04-30 NOTE — Discharge Instructions (Signed)

## 2019-04-30 NOTE — Anesthesia Procedure Notes (Signed)
Central Venous Catheter Insertion Performed by: Albertha Ghee, MD, anesthesiologist Start/End3/29/2021 7:02 AM, 04/30/2019 7:12 AM Patient location: Pre-op. Preanesthetic checklist: patient identified, IV checked, site marked, risks and benefits discussed, surgical consent, monitors and equipment checked, pre-op evaluation, timeout performed and anesthesia consent Position: Trendelenburg Lidocaine 1% used for infiltration and patient sedated Hand hygiene performed , maximum sterile barriers used  and Seldinger technique used Catheter size: 9 Fr Central line and PA cath was placed.MAC introducer Swan type:thermodilation Procedure performed using ultrasound guided technique. Ultrasound Notes:anatomy identified, needle tip was noted to be adjacent to the nerve/plexus identified, no ultrasound evidence of intravascular and/or intraneural injection and image(s) printed for medical record Attempts: 1 Following insertion, line sutured, dressing applied and Biopatch. Post procedure assessment: blood return through all ports, free fluid flow and no air  Patient tolerated the procedure well with no immediate complications.

## 2019-04-30 NOTE — OR Nursing (Signed)
1210 attempted to call patients wife with no answer. 16 phoned patient's wife and notified her that things were fine and that we were closing and Dr. Servando Snare would be out to speak with her in about an hour

## 2019-04-30 NOTE — Transfer of Care (Signed)
Immediate Anesthesia Transfer of Care Note  Patient: Cristian Martin  Procedure(s) Performed: CORONARY ARTERY BYPASS GRAFTING (CABG) TIMES THREE USING LEFT INTERNAL MAMMARY AND RIGHT GREATER SAPHENOUS VEIN HARVESTED ENDOSCOPICALLY. MAMMARY ARTERY TO LAD, SAPHENOUS VEIN GRAFT TO  OM1, SAPHENOUS VEING GRAFT TO RIGHT. BIOPSY OF MEDIASTINAL TISSUE PLACEMENT OF RIGHT FEMORAL A-LINE (N/A Chest) TRANSESOPHAGEAL ECHOCARDIOGRAM (TEE) (N/A )  Patient Location: SICU  Anesthesia Type:General  Level of Consciousness: sedated, unresponsive and Patient remains intubated per anesthesia plan  Airway & Oxygen Therapy: Patient remains intubated per anesthesia plan and Patient placed on Ventilator (see vital sign flow sheet for setting)  Post-op Assessment: Report given to RN and Post -op Vital signs reviewed and stable  Post vital signs: Reviewed and stable  Last Vitals:  Vitals Value Taken Time  BP 75/53 04/30/19 1324  Temp    Pulse 85 04/30/19 1325  Resp 12 04/30/19 1325  SpO2 90 % 04/30/19 1325  Vitals shown include unvalidated device data.  Last Pain:  Vitals:   04/30/19 0432  TempSrc: Oral  PainSc:       Patients Stated Pain Goal: 0 ([id number])  Complications: No apparent anesthesia complications

## 2019-04-30 NOTE — Anesthesia Procedure Notes (Signed)
Arterial Line Insertion Start/End3/29/2021 6:50 AM Performed by: Moshe Salisbury, CRNA  Patient location: Pre-op. Preanesthetic checklist: patient identified, IV checked, site marked, risks and benefits discussed, surgical consent, monitors and equipment checked, pre-op evaluation, timeout performed and anesthesia consent Lidocaine 1% used for infiltration and patient sedated Left, radial was placed Catheter size: 20 G Hand hygiene performed  and maximum sterile barriers used   Attempts: 1 Procedure performed without using ultrasound guided technique. Following insertion, dressing applied and Biopatch. Post procedure assessment: normal  Patient tolerated the procedure well with no immediate complications. Additional procedure comments: SRNA A Jarrell performed under [facility] [place].

## 2019-04-30 NOTE — OR Nursing (Signed)
55. Notified patients wife that surgery had begun and everything was going well.  Confirmed that she was receiving text messages.

## 2019-04-30 NOTE — Progress Notes (Signed)
  Echocardiogram Echocardiogram Transesophageal has been performed.  Cristian Martin 04/30/2019, 8:54 AM

## 2019-04-30 NOTE — Anesthesia Postprocedure Evaluation (Signed)
Anesthesia Post Note  Patient: TIMOFEY ESSARY  Procedure(s) Performed: CORONARY ARTERY BYPASS GRAFTING (CABG) TIMES THREE USING LEFT INTERNAL MAMMARY AND RIGHT GREATER SAPHENOUS VEIN HARVESTED ENDOSCOPICALLY. MAMMARY ARTERY TO LAD, SAPHENOUS VEIN GRAFT TO  OM1, SAPHENOUS VEING GRAFT TO RIGHT. BIOPSY OF MEDIASTINAL TISSUE PLACEMENT OF RIGHT FEMORAL A-LINE (N/A Chest) TRANSESOPHAGEAL ECHOCARDIOGRAM (TEE) (N/A )     Patient location during evaluation: SICU Anesthesia Type: General Level of consciousness: sedated Pain management: pain level controlled Vital Signs Assessment: post-procedure vital signs reviewed and stable Respiratory status: patient remains intubated per anesthesia plan Cardiovascular status: stable Postop Assessment: no apparent nausea or vomiting Anesthetic complications: no    Last Vitals:  Vitals:   04/30/19 1639 04/30/19 1700  BP:  102/64  Pulse:  81  Resp:  14  Temp:  36.8 C  SpO2: 95% 95%    Last Pain:  Vitals:   04/30/19 1400  TempSrc: Core  PainSc:                  Tiajuana Amass

## 2019-04-30 NOTE — Anesthesia Procedure Notes (Signed)
Procedure Name: Intubation Date/Time: 04/30/2019 7:27 AM Performed by: Moshe Salisbury, CRNA Pre-anesthesia Checklist: Patient identified, Emergency Drugs available, Suction available and Patient being monitored Patient Re-evaluated:Patient Re-evaluated prior to induction Oxygen Delivery Method: Circle system utilized Preoxygenation: Pre-oxygenation with 100% oxygen Induction Type: IV induction Ventilation: Two handed mask ventilation required and Oral airway inserted - appropriate to patient size Laryngoscope Size: Mac and 4 Grade View: Grade I Tube type: Oral Tube size: 8.0 mm Number of attempts: 1 Airway Equipment and Method: Stylet Placement Confirmation: ETT inserted through vocal cords under direct vision,  positive ETCO2 and breath sounds checked- equal and bilateral Secured at: 24 cm Tube secured with: Tape Dental Injury: Teeth and Oropharynx as per pre-operative assessment  Comments: SRNA intubated under CRNA supervision

## 2019-04-30 NOTE — Discharge Summary (Signed)
Physician Discharge Summary       [address]       [place] [address]             [phone]    Patient ID: Cristian Martin MRN: [id number] DOB/AGE: 1945/06/05 74 y.o.  Admit date: 04/27/2019 Discharge date: 05/04/2019  Admission Diagnoses: 1. Unstable angina ([place]) 2. Coronary artery disease  Discharge Diagnoses:  1. S/P CABG x 3 2. Expected post op blood loss anemia 3. 4. History of Hypertension 5. History of Hyperlipidemia 6. History of Type 2 diabetes mellitus treated with insulin ([place]) 7. History of Diabetic peripheral neuropathy ([place]) 8. 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 History of Prostate cancer ([place]) 10. History of GERD (gastroesophageal reflux disease)  Consults: None  Procedure (s):  TRANSESOPHAGEAL ECHOCARDIOGRAM (TEE), MEDIAN STERNOTOMY for CORONARY ARTERY BYPASS GRAFTING (CABG) TIMES THREE (LIMA to LAD, SVG to OM1, SVG to RCA) USING LEFT INTERNAL MAMMARY AND RIGHT GREATER SAPHENOUS VEIN HARVESTED ENDOSCOPICALLY by Dr. Servando Snare on 04/27/2019.  History of Presenting Illness: Patient is 74 year old male presents with a month long history of increasing stop substernal chest pain usually with exertion but at times with rest.  Noted that sitting down and taking turns symptoms.  He has a history of walking on flat ground to his mailbox which bring on symptoms for the etiology because of the symptoms he contacted, exercise stress test was ordered .  Patient noted he was not able to exercise more then 1 1/2 min per the patient. He was seen several days ago by Dr Lovette Cliche and sent up for outpatient  cath today . He had some pain during cath that resolved.   At time of exam, the patient was comfortable .  Patient has history of  DM  for "many years" no in insulin and oral agents using up to 60 unit of insulin twice a day. No previous history of stroke or MI.  June 2020- treated for prostate cancer with radiation seeds.  Dr. Servando Snare discussed the need  for coronary artery bypass grafting surgery. Potential risks, benefits, and complications of the surgery were discussed with the patient and he agreed to proceed with surgery. Pre operative carotid duplex US showed no significant internal carotid artery stenosis bilaterally. Patient underwent a CABG x 3 on 04/30/2019.  Brief Hospital Course:  The patient was extubated the evening of surgery. He remained afebrile and hemodynamically stable. He was weaned off Nitro drip. Gordy Councilman, a line, chest tubes, and foley were removed early in the post operative course. Lopressor was started and then changed to Coreg as he was taking prior to surgery. Because of his hypertension, he was restarted on Clonidine 0.3 mg tid, Cardura 2 mg at hs, and Amlodipine 10 mg daily. He was volume over loaded and diuresed. He had ABL anemia. He did not require a post op transfusion. Last H and H was 10.3 and 32.6. He was weaned off the insulin drip.  Once he was tolerating a diet, Metformin XR was restarted (creatinine WNL). He will be restarted on Semaglutide at discharge as given on Sunday only. The patient's glucose remained well controlled.The patient's HGA1C pre op was 7.2. The patient was felt surgically stable for transfer from the ICU to [facility] for further convalescence on 03/31 .He continues to progress with cardiac rehab. He was initially requiring several liters of oxygen via [place] but was later ambulating on room air. He has been tolerating a diet and has had a bowel movement.  Epicardial pacing wires were removed on 04/01 .Regarding his multiple blood pressure medications, as discussed with Dr. Servando Snare, will decrease Clonidine to 0.3 mg bid and restart low dose Losartan at 50 mg daily. He will need cardiology to adjust medications accordingly as an outpatient. Chest tube sutures will be removed the day of discharge. The patient is felt surgically stable for discharge today.   Latest Vital Signs: Blood pressure 117/67, pulse 79,  temperature 98 F (36.7 C), temperature source Oral, resp. rate 18, height 5\' 10"  (1.778 m), weight 93.6 kg, SpO2 90 %.  Physical Exam: Cardiovascular: RRR Pulmonary: Slightly diminished bibasilar breath sounds Abdomen: Soft, non tender, bowel sounds present. Extremities: Mild bilateral lower extremity edema. Wounds: Sternal wound is lean and dry.  No erythema or signs of infection. RLE wounds clean, dry.  Discharge Condition:Stable and discharge to home.  Recent laboratory studies:  Lab Results  Component Value Date   WBC 10.0 05/03/2019   HGB 10.3 (L) 05/03/2019   HCT 32.6 (L) 05/03/2019   MCV 86.0 05/03/2019   PLT 169 05/03/2019   Lab Results  Component Value Date   NA 138 05/03/2019   K 4.2 05/03/2019   CL 102 05/03/2019   CO2 26 05/03/2019   CREATININE 1.05 05/03/2019   GLUCOSE 148 (H) 05/03/2019      Diagnostic Studies: DG Chest 2 View  Result Date: 05/03/2019 CLINICAL DATA:  Post CABG EXAM: CHEST - 2 VIEW COMPARISON:  05/02/2019 FINDINGS: Interval removal of right IJ cannula. Retrocardiac density with air bronchograms. No significant pleural effusion. No pneumothorax. Stable cardiomediastinal contours. IMPRESSION: No pneumothorax.  Retrocardiac atelectasis/consolidation. Electronically Signed   By: Macy Mis M.D.   On: 05/03/2019 07:24   DG Chest 2 View  Result Date: 04/29/2019 CLINICAL DATA:  Preoperative cardiovascular exam EXAM: CHEST - 2 VIEW COMPARISON:  Jun 09, 2018, CT chest December 22, 2018 FINDINGS: Mild increased density in the retrosternal clear space is unchanged compared prior chest x-ray and probably is 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 The heart size is normal. No acute osseous abnormality is identified. IMPRESSION: No active cardiopulmonary disease. Electronically Signed   By: Abelardo Diesel M.D.   On: 04/29/2019 11:34   CARDIAC CATHETERIZATION  Result Date:  04/27/2019  Dist LM to Ost LAD lesion is 99% stenosed.  Ost Cx to Prox Cx lesion is 50% stenosed.  Prox RCA lesion is 70% stenosed.  The left ventricular systolic function is normal.  LV end diastolic pressure is mildly elevated.  The left ventricular ejection fraction is 55-65% by visual estimate.  Prox LAD to Mid LAD lesion is 80% stenosed.  2nd Diag lesion is 70% stenosed.  1.  Left dominant coronary arteries with severe ostial LAD stenosis which is heavily calcified.  Moderate ostial left circumflex stenosis.  Significant disease in the proximal right coronary artery but the vessel is small and nondominant. 2.  Normal LV systolic function and mildly elevated left ventricular end-diastolic pressure. Recommendations: The ostial LAD plaque extends somewhat into the distal left main.  It would be impossible to stent the LAD without jailing the left circumflex.  Due to this and given that the patient is diabetic, I think CABG is a better option.  The patient is not on a P2 Y 12 inhibitor. He started having chest pain at the end of diagnostic cath and was given fentanyl.  Given the severe stenosis in the LAD, recommend  hospital admission and starting heparin drip 8 hours after sheath pull. We consulted cardiothoracic surgery for CABG. I will try to gradually increase his carvedilol dose and see if we can wean him off clonidine.   DG Chest Port 1 View  Result Date: 05/02/2019 CLINICAL DATA:  Bypass surgery. EXAM: PORTABLE CHEST 1 VIEW COMPARISON:  05/01/2019 FINDINGS: The right IJ Swan-Ganz catheter has been removed. The right IJ Cordis is still in place. The left-sided chest tube and mediastinal drain tubes have been removed. No pneumothorax. Stable cardiac enlargement and prominent mediastinal and hilar contours mainly due to very prominent epicardial fat. Streaky bibasilar atelectasis but no effusions or pulmonary edema. IMPRESSION: Removal of chest tube, mediastinal drain and right IJ Swan-Ganz  catheter. No pneumothorax. Streaky bibasilar atelectasis. Electronically Signed   By: Marijo Sanes M.D.   On: 05/02/2019 08:20   DG Chest Port 1 View  Result Date: 05/01/2019 CLINICAL DATA:  Chest tube.  CABG EXAM: PORTABLE CHEST 1 VIEW COMPARISON:  Yesterday FINDINGS: Chest tube in place. Swan-Ganz catheter with tip at the pulmonary artery outflow. Upper mediastinal widening that is stable to mildly progressed. Atelectatic opacity mainly at the left base. Cardiomegaly. CABG. IMPRESSION: 1. Stable or mildly progressed upper mediastinal widening. Postoperative hematoma is a consideration, please correlate with drain output. 2. Lower volumes after extubation but stable atelectasis. Electronically Signed   By: Monte Fantasia M.D.   On: 05/01/2019 08:04   DG Chest Port 1 View  Result Date: 04/30/2019 CLINICAL DATA:  CABG. Z95.1 EXAM: PORTABLE CHEST 1 VIEW COMPARISON:  Chest x-ray dated 04/29/2019 and CT scan dated 12/22/2018 FINDINGS: Endotracheal tube in good position at the thoracic inlet. Tip of the Swan-Ganz catheter is at the main pulmonary artery. NG tube tip is below the diaphragm. Two chest tubes in place. No pneumothorax. Prominence of the superior mediastinum is felt to represent a combination of prominent mediastinal fat and postoperative change. There is focal atelectasis at the left lung base medially. Right lung is clear. Pulmonary vascularity is normal. Cardiac size is normal. IMPRESSION: Atelectasis at the left lung base medially. No pneumothorax. Slight increased prominence of the superior mediastinum as described above. Electronically Signed   By: Lorriane Shire M.D.   On: 04/30/2019 13:58   ECHOCARDIOGRAM COMPLETE  Result Date: 04/27/2019    ECHOCARDIOGRAM REPORT   Patient Name:   Cristian Martin Date of Exam: 04/27/2019 Medical Rec #:  [id number]      Height:       70.0 in Accession #:    [id number]     Weight:       210.0 lb Date of Birth:  30-Dec-1945      BSA:          2.131 m Patient  Age:    36 years       BP:           138/86 mmHg Patient Gender: M              HR:           79 bpm. Exam Location:  Inpatient Procedure: 2D Echo, Cardiac Doppler and Color Doppler Indications:    Chest pain 786.50/R07.9  History:        Patient has no prior history of Echocardiogram examinations.                 Risk Factors:Hypertension, Diabetes and Dyslipidemia. Unstable  angina. GERD.  Sonographer:    Clayton Lefort RDCS (AE) Referring Phys: [id number] Lilia Argue GER[place]ARD[place] IMPRESSIONS  1. Left ventricular ejection fraction, by estimation, is 55 to 60%. The left ventricle has normal function. The left ventricle has no regional wall motion abnormalities. There is moderate concentric left ventricular hypertrophy. Left ventricular diastolic parameters are consistent with Grade I diastolic dysfunction (impaired relaxation).  2. Right ventricular systolic function is normal. The right ventricular size is normal. Tricuspid regurgitation signal is inadequate for assessing PA pressure.  3. The mitral valve is grossly normal. No evidence of mitral valve regurgitation. No evidence of mitral stenosis.  4. The aortic valve is tricuspid. Aortic valve regurgitation is not visualized. Mild aortic valve sclerosis is present, with no evidence of aortic valve stenosis.  5. The inferior vena cava is normal in size with greater than 50% respiratory variability, suggesting right atrial pressure of 3 mmHg. FINDINGS  Left Ventricle: Left ventricular ejection fraction, by estimation, is 55 to 60%. The left ventricle has normal function. The left ventricle has no regional wall motion abnormalities. The left ventricular internal cavity size was normal in size. There is  moderate concentric left ventricular hypertrophy. Left ventricular diastolic parameters are consistent with Grade I diastolic dysfunction (impaired relaxation). Right Ventricle: The right ventricular size is normal. No increase in right ventricular wall thickness.  Right ventricular systolic function is normal. Tricuspid regurgitation signal is inadequate for assessing PA pressure. Left Atrium: Left atrial size was normal in size. Right Atrium: Right atrial size was normal in size. Pericardium: There is no evidence of pericardial effusion. Mitral Valve: The mitral valve is grossly normal. No evidence of mitral valve regurgitation. No evidence of mitral valve stenosis. MV peak gradient, 3.2 mmHg. The mean mitral valve gradient is 1.0 mmHg. Tricuspid Valve: The tricuspid valve is grossly normal. Tricuspid valve regurgitation is not demonstrated. No evidence of tricuspid stenosis. Aortic Valve: The aortic valve is tricuspid. Aortic valve regurgitation is not visualized. Mild aortic valve sclerosis is present, with no evidence of aortic valve stenosis. Aortic valve mean gradient measures 5.0 mmHg. Aortic valve peak gradient measures 8.2 mmHg. Aortic valve area, by VTI measures 2.68 cm. Pulmonic Valve: The pulmonic valve was grossly normal. Pulmonic valve regurgitation is not visualized. No evidence of pulmonic stenosis. Aorta: The aortic root is normal in size and structure. Venous: The inferior vena cava is normal in size with greater than 50% respiratory variability, suggesting right atrial pressure of 3 mmHg. IAS/Shunts: No atrial level shunt detected by color flow Doppler.  LEFT VENTRICLE PLAX 2D LVIDd:         4.50 cm  Diastology LVIDs:         3.30 cm  LV e' lateral:   8.05 cm/s LV PW:         1.30 cm  LV E/e' lateral: 9.9 LV IVS:        1.47 cm  LV e' medial:    6.85 cm/s LVOT diam:     2.20 cm  LV E/e' medial:  11.6 LV SV:         78 LV SV Index:   37 LVOT Area:     3.80 cm  RIGHT VENTRICLE             IVC RV Basal diam:  3.00 cm     IVC diam: 1.90 cm RV S prime:     13.90 cm/s TAPSE (M-mode): 1.8 cm LEFT ATRIUM  Index       RIGHT ATRIUM           Index LA diam:      3.30 cm 1.55 cm/m  RA Area:     10.20 cm LA Vol (A2C): 42.2 ml 19.80 ml/m RA Volume:   19.10  ml  8.96 ml/m LA Vol (A4C): 53.2 ml 24.96 ml/m  AORTIC VALVE AV Area (Vmax):    2.36 cm AV Area (Vmean):   2.42 cm AV Area (VTI):     2.68 cm AV Vmax:           143.00 cm/s AV Vmean:          104.000 cm/s AV VTI:            0.292 m AV Peak Grad:      8.2 mmHg AV Mean Grad:      5.0 mmHg LVOT Vmax:         88.80 cm/s LVOT Vmean:        66.100 cm/s LVOT VTI:          0.206 m LVOT/AV VTI ratio: 0.71  AORTA Ao Root diam: 3.50 cm Ao Asc diam:  3.60 cm MITRAL VALVE MV Area (PHT): 4.80 cm    SHUNTS MV Peak grad:  3.2 mmHg    Systemic VTI:  0.21 m MV Mean grad:  1.0 mmHg    Systemic Diam: 2.20 cm MV Vmax:       0.89 m/s MV Vmean:      52.0 cm/s MV Decel Time: 158 msec MV E velocity: 79.30 cm/s MV A velocity: 83.60 cm/s MV E/A ratio:  0.95 Eleonore Chiquito MD Electronically signed by Eleonore Chiquito MD Signature Date/Time: 04/27/2019/4:17:24 PM    Final    ECHO INTRAOPERATIVE TEE  Result Date: 04/30/2019  *INTRAOPERATIVE TRANSESOPHAGEAL REPORT *  Patient Name:   Cristian Martin Date of Exam: 04/30/2019 Medical Rec #:  [id number]      Height:       70.0 in Accession #:    [id number]     Weight:       200.2 lb Date of Birth:  1945-08-12      BSA:          2.09 m Patient Age:    42 years       BP:           129/82 mmHg Patient Gender: M              HR:           60 bpm. Exam Location:  Anesthesiology Transesophogeal exam was perform intraoperatively during surgical procedure. Patient was closely monitored under general anesthesia during the entirety of examination. Indications:     Coronary artery disease Sonographer:     Vickie Epley RDCS Performing Phys: [id number] Lilia Argue [id number] Diagnosing Phys: Suzette Battiest MD Complications: No known complications during this procedure. POST-OP IMPRESSIONS - Left Ventricle: The left ventricle is unchanged from pre-bypass. - Right Ventricle: The right ventricle appears unchanged from pre-bypass. - Aorta: The aorta appears unchanged from pre-bypass. - Left Atrium: The left atrium appears  unchanged from pre-bypass. - Left Atrial Appendage: The left atrial appendage appears unchanged from pre-bypass. - Aortic Valve: The aortic valve appears unchanged from pre-bypass. - Mitral Valve: The mitral valve appears unchanged from pre-bypass. - Tricuspid Valve: The tricuspid valve appears unchanged from pre-bypass. - Interatrial Septum: The interatrial septum appears unchanged from pre-bypass. - Interventricular Septum: The interventricular septum appears  unchanged from pre-bypass. - Pericardium: The pericardium appears unchanged from pre-bypass. PRE-OP FINDINGS  Left Ventricle: The left ventricle has normal systolic function, with an ejection fraction of 55-60%. The cavity size was normal. There is mild concentric left ventricular hypertrophy. Right Ventricle: The right ventricle has normal systolic function. The cavity was normal. There is no increase in right ventricular wall thickness. Left Atrium: Left atrial size was normal in size. Right Atrium: Right atrial size was normal in size. Right atrial pressure is estimated at 10 mmHg. Interatrial Septum: Possible small PFO by color doppler. There is right bowing of the interatrial septum, suggestive of elevated left atrial pressure. Pericardium: There is no evidence of pericardial effusion. Mitral Valve: The mitral valve is normal in structure. Mild thickening of the mitral valve leaflet. Mild calcification of the mitral valve leaflet. Mitral valve regurgitation is not visualized by color flow Doppler. Tricuspid Valve: The tricuspid valve was normal in structure. Tricuspid valve regurgitation was not visualized by color flow Doppler. Aortic Valve: Tricuspid aortic valve. Moderate Left coronary cusp restriction There is mild thickening of the aortic valve and There is moderate calcification of the aortic valve Aortic valve regurgitation was not visualized by color flow Doppler. There is no stenosis of the aortic valve. Pulmonic Valve: The pulmonic valve was  normal in structure. Pulmonic valve regurgitation is not visualized by color flow Doppler. Aorta: The aortic root, ascending aorta and aortic arch are normal in size and structure. +--------------+--------++  LEFT VENTRICLE            +--------------+--------++  PLAX 2D                   +--------------+--------++  LVOT diam:     2.10 cm    +--------------+--------++  LVOT Area:     3.46 cm   +--------------+--------++                            +--------------+--------++ +-------------+-----------++  AORTIC VALVE                +-------------+-----------++  AV Vmax:      133.00 cm/s   +-------------+-----------++  AV Vmean:     89.600 cm/s   +-------------+-----------++  AV VTI:       0.292 m       +-------------+-----------++  AV Peak Grad: 7.1 mmHg      +-------------+-----------++  AV Mean Grad: 4.0 mmHg      +-------------+-----------++  +--------------+-------+  SHUNTS                  +--------------+-------+  Systemic Diam: 2.10 cm  +--------------+-------+  Suzette Battiest MD Electronically signed by Suzette Battiest MD Signature Date/Time: 04/30/2019/3:29:32 PM    Final    VAS US DOPPLER PRE CABG  Result Date: 04/29/2019 PREOPERATIVE VASCULAR EVALUATION  Indications:      Pre-CABG. Risk Factors:     Hypertension, hyperlipidemia, Diabetes, coronary artery                   disease. Comparison Study: No prior study on file Performing Technologist: Sharion Dove RVS  Examination Guidelines: A complete evaluation includes B-mode imaging, spectral Doppler, color Doppler, and power Doppler as needed of all accessible portions of each vessel. Bilateral testing is considered an integral part of a complete examination. Limited examinations for reoccurring indications may be performed as noted.  Right Carotid Findings: +----------+--------+--------+--------+------------+------------------+  PSV cm/s EDV cm/s Stenosis Describe     Comments             +----------+--------+--------+--------+------------+------------------+  CCA Prox   80       16                             intimal thickening  +----------+--------+--------+--------+------------+------------------+  CCA Distal 64       18                             intimal thickening  +----------+--------+--------+--------+------------+------------------+  ICA Prox   66       17                heterogenous                     +----------+--------+--------+--------+------------+------------------+  ICA Distal 62       22                                                 +----------+--------+--------+--------+------------+------------------+  ECA        69       12                                                 +----------+--------+--------+--------+------------+------------------+ Portions of this table do not appear on this page. +----------+--------+-------+--------+------------+             PSV cm/s EDV cms Describe Arm Pressure  +----------+--------+-------+--------+------------+  Subclavian 80                        110           +----------+--------+-------+--------+------------+ +---------+--------+--+--------+-+  Vertebral PSV cm/s 31 EDV cm/s 8  +---------+--------+--+--------+-+ Left Carotid Findings: +----------+--------+--------+--------+------------+------------------+             PSV cm/s EDV cm/s Stenosis Describe     Comments            +----------+--------+--------+--------+------------+------------------+  CCA Prox   87       18                             intimal thickening  +----------+--------+--------+--------+------------+------------------+  CCA Distal 62       17                             intimal thickening  +----------+--------+--------+--------+------------+------------------+  ICA Prox   53       18                heterogenous                     +----------+--------+--------+--------+------------+------------------+  ICA Distal 72       30                                                  +----------+--------+--------+--------+------------+------------------+  ECA        85       9                                                  +----------+--------+--------+--------+------------+------------------+ +----------+--------+--------+--------+------------+  Subclavian PSV cm/s EDV cm/s Describe Arm Pressure  +----------+--------+--------+--------+------------+             72                         115           +----------+--------+--------+--------+------------+ +---------+--------+--+--------+--+  Vertebral PSV cm/s 38 EDV cm/s 12  +---------+--------+--+--------+--+  ABI Findings: +--------+------------------+-----+---------+--------+  Right    Rt Pressure (mmHg) Index Waveform  Comment   +--------+------------------+-----+---------+--------+  Brachial 110                      triphasic           +--------+------------------+-----+---------+--------+  PTA      254                2.21  biphasic            +--------+------------------+-----+---------+--------+  DP       254                2.21  biphasic            +--------+------------------+-----+---------+--------+ +--------+------------------+-----+---------+-------+  Left     Lt Pressure (mmHg) Index Waveform  Comment  +--------+------------------+-----+---------+-------+  Brachial 115                      triphasic          +--------+------------------+-----+---------+-------+  PTA      254                2.21  biphasic           +--------+------------------+-----+---------+-------+  DP       134                1.17  biphasic           +--------+------------------+-----+---------+-------+ +-------+---------------+----------------+  ABI/TBI Today's ABI/TBI Previous ABI/TBI  +-------+---------------+----------------+  Right   2.2                               +-------+---------------+----------------+  Left    2.2                               +-------+---------------+----------------+ Arterial wall calcification precludes accurate ankle  pressures and ABIs.  Right Doppler Findings: +-----------+--------+-----+---------+--------+  Site        Pressure Index Doppler   Comments  +-----------+--------+-----+---------+--------+  Brachial    110            triphasic           +-----------+--------+-----+---------+--------+  Radial                     triphasic           +-----------+--------+-----+---------+--------+  Ulnar                      triphasic           +-----------+--------+-----+---------+--------+  Palmar Arch                          wnl       +-----------+--------+-----+---------+--------+  Left Doppler Findings: +-----------+--------+-----+---------+-----------------------------------------+  Site        Pressure Index Doppler   Comments                                   +-----------+--------+-----+---------+-----------------------------------------+  Brachial    115            triphasic                                            +-----------+--------+-----+---------+-----------------------------------------+  Radial                     triphasic                                            +-----------+--------+-----+---------+-----------------------------------------+  Ulnar                      triphasic                                            +-----------+--------+-----+---------+-----------------------------------------+  Palmar Arch                          Doppler signal remains normal with radial                                        compression and reverses with ulnar                                              comperssion                                +-----------+--------+-----+---------+-----------------------------------------+  Summary: Right Carotid: The extracranial vessels were near-normal with only minimal wall                thickening or plaque. Left Carotid: The extracranial vessels were near-normal with only minimal wall               thickening or plaque. Vertebrals:  Bilateral vertebral arteries demonstrate  antegrade flow. Subclavians: Normal flow hemodynamics were seen in bilateral subclavian              arteries. Right ABI: Resting right ankle-brachial index indicates noncompressible right lower extremity arteries. Left ABI: Resting left ankle-brachial index indicates noncompressible left lower extremity arteries. Right Upper Extremity: Doppler waveforms remain within normal limits with right radial compression. Doppler waveforms remain within normal limits with right ulnar compression. Left Upper Extremity: Doppler waveforms remain within normal limits with left radial compression. Doppler waveforms remain within normal limits  with left ulnar compression.   Electronically signed by Deitra Mayo MD on 04/29/2019 at 1:45:50 PM.    Final          Discharge Medications: Allergies as of 05/04/2019      Reactions   Ace Inhibitors Other (See Comments)   Hyperkalemia.      Medication List    TAKE these medications   acetaminophen 325 MG tablet Commonly known as: TYLENOL Take 2 tablets (650 mg total) by mouth every 6 (six) hours as needed for mild pain.   amLODipine 10 MG tablet Commonly known as: NORVASC Take 10 mg by mouth daily.   aspirin 325 MG EC tablet Take 1 tablet (325 mg total) by mouth daily. What changed:   medication strength  how much to take   calcium carbonate 500 MG chewable tablet Commonly known as: TUMS - dosed in mg elemental calcium Chew 2-3 tablets by mouth daily as needed for indigestion or heartburn.   carvedilol 6.25 MG tablet Commonly known as: COREG Take 3.125 mg by mouth 2 (two) times daily with a meal.   cloNIDine 0.3 MG tablet Commonly known as: CATAPRES Take 1 tablet (0.3 mg total) by mouth 2 (two) times daily. What changed: when to take this   doxazosin 4 MG tablet Commonly known as: CARDURA Take 2 mg by mouth at bedtime.   DULoxetine 60 MG capsule Commonly known as: CYMBALTA Take 60 mg by mouth daily.   famotidine 20 MG  tablet Commonly known as: PEPCID Take 20 mg by mouth 2 (two) times daily.   furosemide 20 MG tablet Commonly known as: LASIX Take 1 tablet (20 mg total) by mouth daily. For one week then stop   guaiFENesin 600 MG 12 hr tablet Commonly known as: MUCINEX Take 1 tablet (600 mg total) by mouth 2 (two) times daily as needed for cough or to loosen phlegm.   losartan 50 MG tablet Commonly known as: COZAAR Take 1 tablet (50 mg total) by mouth daily. What changed:   medication strength  how much to take   metFORMIN 500 MG 24 hr tablet Commonly known as: GLUCOPHAGE-XR Take 1,000 mg by mouth in the morning and at bedtime.   MULTIVITAMIN ADULT PO Take 1 tablet by mouth daily.   NovoLOG Mix 70/30 FlexPen (70-30) 100 UNIT/ML FlexPen Generic drug: insulin aspart protamine - aspart Inject 0.2 mLs (20 Units total) into the skin 2 (two) times daily before a meal. As the patient and I discussed, start with a lower dose of Insulin as he has not been eating well. Titrate Insulin upward to dose taken prior to surgery (60 units bid) as tolerates. What changed:   medication strength  how much to take  additional instructions   omeprazole 20 MG capsule Commonly known as: PRILOSEC Take 20 mg by mouth 2 (two) times daily.   OZEMPIC (1 MG/DOSE) [place] Inject 1 mg into the skin every Sunday.   potassium chloride 10 MEQ tablet Commonly known as: KLOR-CON Take 1 tablet (10 mEq total) by mouth daily. For one week then stop.   rosuvastatin 40 MG tablet Commonly known as: CRESTOR Take 1 tablet (40 mg total) by mouth daily. What changed:   medication strength  how much to take   traMADol 50 MG tablet Commonly known as: ULTRAM Take 1 tablet (50 mg total) by mouth every 6 (six) hours as needed for moderate pain.   Trospium Chloride 60 MG Cp24 Take 60 mg by mouth daily.  The patient has been discharged on:   1.Beta Blocker:  Yes [ x  ]                              No   [   ]                               If No, reason:  2.Ace Inhibitor/ARB: Yes [  x ]                                     No  [    ]                                     If No, reason:  3.Statin:   Yes [  x ]                  No  [   ]                  If No, reason:  4.Ecasa:  Yes  [ x  ]                  No   [   ]                  If No, reason:  Follow Up Appointments: Follow-up Information    Grace Isaac, MD. Go on 06/07/2019.   Specialty: Cardiothoracic Surgery Why: PA/LAT CXR to be taken (at [facility] which is in the same building as Dr. Everrett Coombe office) on 05/06 at 1:30 pm;Appointment time is at 2:00 pm Contact information: [place] [address] [phone]        Wellington Hampshire, MD. Go on 05/23/2019.   Specialty: Cardiology Why: Appointment time is at 1: 30 pm Contact information: [place] [address] [address]. Call.   Why: for a follow up appointment regarding further diabetes management and surveillance HGA1C 7.2 (he sees a doctor at the [place] for this)          Signed: Qusay Villada M ZimmermanPA-C 05/04/2019, 8:01 AM

## 2019-04-30 NOTE — Plan of Care (Signed)
  Problem: Cardiovascular: Goal: Ability to achieve and maintain adequate cardiovascular perfusion will improve Outcome: Progressing   

## 2019-04-30 NOTE — Progress Notes (Signed)
EVENING ROUNDS NOTE :     [address]       [place] [address]             [phone]                 Day of Surgery Procedure(s) (LRB): CORONARY ARTERY BYPASS GRAFTING (CABG) TIMES THREE USING LEFT INTERNAL MAMMARY AND RIGHT GREATER SAPHENOUS VEIN HARVESTED ENDOSCOPICALLY. MAMMARY ARTERY TO LAD, SAPHENOUS VEIN GRAFT TO  OM1, SAPHENOUS VEING GRAFT TO RIGHT. BIOPSY OF MEDIASTINAL TISSUE PLACEMENT OF RIGHT FEMORAL A-LINE (N/A) TRANSESOPHAGEAL ECHOCARDIOGRAM (TEE) (N/A)   Total Length of Stay:  LOS: 3 days  Events:  No events exttubated Minimal CT output Good hemodynamics    BP 120/69   Pulse 83   Temp 98.8 F (37.1 C)   Resp 18   Ht 5\' 10"  (1.778 m)   Wt 90.8 kg   SpO2 94%   BMI 28.73 kg/m   PAP: (22-28)/(10-18) 25/13 CO:  [4.1 L/min-4.9 L/min] 4.5 L/min CI:  [2 L/min/m2-2.4 L/min/m2] 2.2 L/min/m2  Vent Mode: PSV;CPAP FiO2 (%):  [40 %-50 %] 40 % Set Rate:  [4 bmp-16 bmp] 4 bmp Vt Set:  [580 mL] 580 mL PEEP:  [5 cmH20] 5 cmH20 Pressure Support:  [10 cmH20] 10 cmH20  . sodium chloride 20 mL/hr at 04/30/19 1900  . [START ON 05/01/2019] sodium chloride    . sodium chloride 20 mL/hr at 04/30/19 1409  . albumin human 12.5 g (04/30/19 1602)  . cefUROXime (ZINACEF)  IV Stopped (04/30/19 1720)  . dexmedetomidine (PRECEDEX) IV infusion Stopped (04/30/19 1527)  . DOPamine Stopped (04/30/19 1533)  . famotidine (PEPCID) IV Stopped (04/30/19 1414)  . insulin 1.4 mL/hr at 04/30/19 1900  . lactated ringers    . lactated ringers    . lactated ringers 20 mL/hr at 04/30/19 1900  . nitroGLYCERIN Stopped (04/30/19 1323)  . phenylephrine (NEO-SYNEPHRINE) Adult infusion Stopped (04/30/19 1720)  . vancomycin      I/O last 3 completed shifts: In: 4184.9 [P.O.:120; I.V.:3403.5; Blood:169; IV Piggyback:492.4] Out: 2440 [Urine:1970; Blood:280; Chest Tube:190]   CBC Latest Ref Rng & Units 04/30/2019 04/30/2019 04/30/2019  WBC 4.0 - 10.5 K/uL - - -  Hemoglobin 13.0  - 17.0 g/dL 12.6(L) 12.9(L) 13.3  Hematocrit 39.0 - 52.0 % 37.0(L) 38.0(L) 39.0  Platelets 150 - 400 K/uL - - -    BMP Latest Ref Rng & Units 04/30/2019 04/30/2019 04/30/2019  Glucose 70 - 99 mg/dL - - -  BUN 8 - 23 mg/dL - - -  Creatinine 0.61 - 1.24 mg/dL - - -  BUN/Creat Ratio 10 - 24 - - -  Sodium 135 - 145 mmol/L 141 136 140  Potassium 3.5 - 5.1 mmol/L 4.3 4.3 4.5  Chloride 98 - 111 mmol/L - - -  CO2 22 - 32 mmol/L - - -  Calcium 8.9 - 10.3 mg/dL - - -    ABG    Component Value Date/Time   PHART 7.348 (L) 04/30/2019 1724   PCO2ART 37.9 04/30/2019 1724   PO2ART 68.0 (L) 04/30/2019 1724   HCO3 20.9 04/30/2019 1724   TCO2 22 04/30/2019 1724   ACIDBASEDEF 4.0 (H) 04/30/2019 1724   O2SAT 93.0 04/30/2019 1724       Melodie Bouillon, MD 04/30/2019 7:10 PM

## 2019-04-30 NOTE — Progress Notes (Signed)
RT attempted to do parameters with NIF/VC. Patient was too lethargic. RN made aware. Will attempt to wean again later.

## 2019-04-30 NOTE — OR Nursing (Signed)
First call made to ICU at 1211, second call made at ICU @ 1245

## 2019-04-30 NOTE — Brief Op Note (Signed)
04/27/2019 - 04/30/2019  9:55 AM  PATIENT:  Cristian Martin  75 y.o. male  PRE-OPERATIVE DIAGNOSIS:  CAD  POST-OPERATIVE DIAGNOSIS:  CAD  PROCEDURE:  TRANSESOPHAGEAL ECHOCARDIOGRAM (TEE), MEDIAN STERNOTOMY for CORONARY ARTERY BYPASS GRAFTING (CABG) TIMES THREE (LIMA to LAD, SVG to OM1, SVG to RCA) USING LEFT INTERNAL MAMMARY AND RIGHT GREATER SAPHENOUS VEIN HARVESTED ENDOSCOPICALLY  SURGEON:  Surgeon(s) and Role:    Grace Isaac, MD - Primary    Lajuana Matte, MD - Assisting  PHYSICIAN ASSISTANT: Lars Pinks PA-C  ANESTHESIA:   general  EBL: Per perfusion, anesthesia record  DRAINS: Chest tubes placed in the mediastinal and pleural spaces   COUNTS CORRECT:  YES  DICTATION: .Dragon Dictation  PLAN OF CARE: Admit to inpatient   PATIENT DISPOSITION:  ICU - intubated and hemodynamically stable.   Delay start of Pharmacological VTE agent (>24hrs) due to surgical blood loss or risk of bleeding: yes  BASELINE WEIGHT: 90.8 kg

## 2019-04-30 NOTE — Progress Notes (Signed)
Dr Kipp Brood made aware of patient's parameters. NIF -14 and VC of 500. Patient is alert and follows all commands. Patient is able to hold head off for > 3 seconds. Dr Kipp Brood stated to proceed with extubation and Bipap if needed. RN will notify RT and continue to monitor.

## 2019-04-30 NOTE — Progress Notes (Signed)
Pre Procedure note for inpatients:   RAYDEN KOHRING has been scheduled for Procedure(s): CORONARY ARTERY BYPASS GRAFTING (CABG) (N/A) TRANSESOPHAGEAL ECHOCARDIOGRAM (TEE) (N/A) today. The various methods of treatment have been discussed with the patient. After consideration of the risks, benefits and treatment options the patient has consented to the planned procedure.   The patient has been seen and labs reviewed. There are no changes in the patient's condition to prevent proceeding with the planned procedure today.  Recent labs:  Lab Results  Component Value Date   WBC 7.6 04/30/2019   HGB 13.0 04/30/2019   HCT 41.4 04/30/2019   PLT 268 04/30/2019   GLUCOSE 211 (H) 04/30/2019   CHOL 93 04/28/2019   TRIG 167 (H) 04/28/2019   HDL 37 (L) 04/28/2019   LDLCALC 23 04/28/2019   ALT 33 04/28/2019   AST 27 04/28/2019   NA 138 04/30/2019   K 4.2 04/30/2019   CL 103 04/30/2019   CREATININE 1.28 (H) 04/30/2019   BUN 20 04/30/2019   CO2 24 04/30/2019   INR 1.0 04/28/2019   HGBA1C 7.2 (H) 04/28/2019   DG Chest 2 View  Result Date: 04/29/2019 CLINICAL DATA:  Preoperative cardiovascular exam EXAM: CHEST - 2 VIEW COMPARISON:  Jun 09, 2018, CT chest December 22, 2018 FINDINGS: Mild increased density in the retrosternal clear space is unchanged compared prior chest x-ray and probably is 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 The heart size is normal. No acute osseous abnormality is identified. IMPRESSION: No active cardiopulmonary disease. Electronically Signed   By: Abelardo Diesel M.D.   On: 04/29/2019 11:34   VAS US DOPPLER PRE CABG  Result Date: 04/29/2019 PREOPERATIVE VASCULAR EVALUATION  Indications:      Pre-CABG. Risk Factors:     Hypertension, hyperlipidemia, Diabetes, coronary artery                   disease. Comparison Study: No prior study on file Performing Technologist: Sharion Dove RVS   Examination Guidelines: A complete evaluation includes B-mode imaging, spectral Doppler, color Doppler, and power Doppler as needed of all accessible portions of each vessel. Bilateral testing is considered an integral part of a complete examination. Limited examinations for reoccurring indications may be performed as noted.  Right Carotid Findings: +----------+--------+--------+--------+------------+------------------+           PSV cm/sEDV cm/sStenosisDescribe    Comments           +----------+--------+--------+--------+------------+------------------+ CCA Prox  80      16                          intimal thickening +----------+--------+--------+--------+------------+------------------+ CCA Distal64      18                          intimal thickening +----------+--------+--------+--------+------------+------------------+ ICA Prox  66      17              heterogenous                   +----------+--------+--------+--------+------------+------------------+ ICA Distal62      22                                             +----------+--------+--------+--------+------------+------------------+  ECA       69      12                                             +----------+--------+--------+--------+------------+------------------+ Portions of this table do not appear on this page. +----------+--------+-------+--------+------------+           PSV cm/sEDV cmsDescribeArm Pressure +----------+--------+-------+--------+------------+ Subclavian80                     110          +----------+--------+-------+--------+------------+ +---------+--------+--+--------+-+ VertebralPSV cm/s31EDV cm/s8 +---------+--------+--+--------+-+ Left Carotid Findings: +----------+--------+--------+--------+------------+------------------+           PSV cm/sEDV cm/sStenosisDescribe    Comments           +----------+--------+--------+--------+------------+------------------+ CCA  Prox  87      18                          intimal thickening +----------+--------+--------+--------+------------+------------------+ CCA Distal62      17                          intimal thickening +----------+--------+--------+--------+------------+------------------+ ICA Prox  53      18              heterogenous                   +----------+--------+--------+--------+------------+------------------+ ICA Distal72      30                                             +----------+--------+--------+--------+------------+------------------+ ECA       85      9                                              +----------+--------+--------+--------+------------+------------------+ +----------+--------+--------+--------+------------+ SubclavianPSV cm/sEDV cm/sDescribeArm Pressure +----------+--------+--------+--------+------------+           72                      115          +----------+--------+--------+--------+------------+ +---------+--------+--+--------+--+ VertebralPSV cm/s38EDV cm/s12 +---------+--------+--+--------+--+  ABI Findings: +--------+------------------+-----+---------+--------+ Right   Rt Pressure (mmHg)IndexWaveform Comment  +--------+------------------+-----+---------+--------+ Brachial110                    triphasic         +--------+------------------+-----+---------+--------+ PTA     254               2.21 biphasic          +--------+------------------+-----+---------+--------+ DP      254               2.21 biphasic          +--------+------------------+-----+---------+--------+ +--------+------------------+-----+---------+-------+ Left    Lt Pressure (mmHg)IndexWaveform Comment +--------+------------------+-----+---------+-------+ [id number]                    triphasic        +--------+------------------+-----+---------+-------+ PTA     254  2.21 biphasic          +--------+------------------+-----+---------+-------+ DP      134               1.17 biphasic         +--------+------------------+-----+---------+-------+ +-------+---------------+----------------+ ABI/TBIToday's ABI/TBIPrevious ABI/TBI +-------+---------------+----------------+ Right  2.2                             +-------+---------------+----------------+ Left   2.2                             +-------+---------------+----------------+ Arterial wall calcification precludes accurate ankle pressures and ABIs.  Right Doppler Findings: +-----------+--------+-----+---------+--------+ Site       PressureIndexDoppler  Comments +-----------+--------+-----+---------+--------+ Brachial   110          triphasic         +-----------+--------+-----+---------+--------+ Radial                  triphasic         +-----------+--------+-----+---------+--------+ Ulnar                   triphasic         +-----------+--------+-----+---------+--------+ Palmar Arch                      wnl      +-----------+--------+-----+---------+--------+  Left Doppler Findings: +-----------+--------+-----+---------+-----------------------------------------+ Site       PressureIndexDoppler  Comments                                  +-----------+--------+-----+---------+-----------------------------------------+ Brachial   115          triphasic                                          +-----------+--------+-----+---------+-----------------------------------------+ Radial                  triphasic                                          +-----------+--------+-----+---------+-----------------------------------------+ Ulnar                   triphasic                                          +-----------+--------+-----+---------+-----------------------------------------+ Palmar Arch                      Doppler signal remains normal with radial                                   compression and reverses with ulnar                                        comperssion                               +-----------+--------+-----+---------+-----------------------------------------+  Summary: Right Carotid: The extracranial vessels were near-normal with only minimal wall                thickening or plaque. Left Carotid: The extracranial vessels were near-normal with only minimal wall               thickening or plaque. Vertebrals:  Bilateral vertebral arteries demonstrate antegrade flow. Subclavians: Normal flow hemodynamics were seen in bilateral subclavian              arteries. Right ABI: Resting right ankle-brachial index indicates noncompressible right lower extremity arteries. Left ABI: Resting left ankle-brachial index indicates noncompressible left lower extremity arteries. Right Upper Extremity: Doppler waveforms remain within normal limits with right radial compression. Doppler waveforms remain within normal limits with right ulnar compression. Left Upper Extremity: Doppler waveforms remain within normal limits with left radial compression. Doppler waveforms remain within normal limits with left ulnar compression.   Electronically signed by Deitra Mayo MD on 04/29/2019 at 1:45:50 PM.    Final    Grace Isaac, MD 04/30/2019 7:17 AM

## 2019-04-30 NOTE — Procedures (Signed)
Extubation Procedure Note  Patient Details:   Name: ONEY DUTSON DOB: 08/23/1945 MRN: [id number]   Airway Documentation:    Vent end date: 04/30/19 Vent end time: 1835   Evaluation  O2 sats: stable throughout Complications: No apparent complications Patient did tolerate procedure well. Bilateral Breath Sounds: Clear, Diminished   Yes   Patient extubated per rapid wean without complications. Positive cuff leak. NIF -25, VC 680. Vitals are stable on 4L [place]. RN at bedside.  Herbie Saxon Veronnica Hennings 04/30/2019, 6:40 PM

## 2019-05-01 ENCOUNTER — Encounter: Payer: Self-pay | Admitting: *Deleted

## 2019-05-01 ENCOUNTER — Inpatient Hospital Stay (HOSPITAL_COMMUNITY): Payer: No Typology Code available for payment source

## 2019-05-01 LAB — POCT I-STAT, CHEM 8
BUN: 20 mg/dL (ref 8–23)
BUN: 15 mg/dL (ref 8–23)
BUN: 19 mg/dL (ref 8–23)
BUN: 17 mg/dL (ref 8–23)
BUN: 17 mg/dL (ref 8–23)
Calcium, Ion: 1.21 mmol/L (ref 1.15–1.40)
Calcium, Ion: 0.88 mmol/L — CL (ref 1.15–1.40)
Calcium, Ion: 1.23 mmol/L (ref 1.15–1.40)
Calcium, Ion: 1.08 mmol/L — ABNORMAL LOW (ref 1.15–1.40)
Calcium, Ion: 1.29 mmol/L (ref 1.15–1.40)
Chloride: 104 mmol/L (ref 98–111)
Chloride: 104 mmol/L (ref 98–111)
Chloride: 99 mmol/L (ref 98–111)
Chloride: 104 mmol/L (ref 98–111)
Chloride: 105 mmol/L (ref 98–111)
Creatinine, Ser: 1 mg/dL (ref 0.61–1.24)
Creatinine, Ser: 0.7 mg/dL (ref 0.61–1.24)
Creatinine, Ser: 1 mg/dL (ref 0.61–1.24)
Creatinine, Ser: 0.9 mg/dL (ref 0.61–1.24)
Creatinine, Ser: 1 mg/dL (ref 0.61–1.24)
Glucose, Bld: 157 mg/dL — ABNORMAL HIGH (ref 70–99)
Glucose, Bld: 104 mg/dL — ABNORMAL HIGH (ref 70–99)
Glucose, Bld: 131 mg/dL — ABNORMAL HIGH (ref 70–99)
Glucose, Bld: 118 mg/dL — ABNORMAL HIGH (ref 70–99)
Glucose, Bld: 170 mg/dL — ABNORMAL HIGH (ref 70–99)
HCT: 36 % — ABNORMAL LOW (ref 39.0–52.0)
HCT: 30 % — ABNORMAL LOW (ref 39.0–52.0)
HCT: 34 % — ABNORMAL LOW (ref 39.0–52.0)
HCT: 31 % — ABNORMAL LOW (ref 39.0–52.0)
HCT: 36 % — ABNORMAL LOW (ref 39.0–52.0)
Hemoglobin: 12.2 g/dL — ABNORMAL LOW (ref 13.0–17.0)
Hemoglobin: 10.2 g/dL — ABNORMAL LOW (ref 13.0–17.0)
Hemoglobin: 11.6 g/dL — ABNORMAL LOW (ref 13.0–17.0)
Hemoglobin: 10.5 g/dL — ABNORMAL LOW (ref 13.0–17.0)
Hemoglobin: 12.2 g/dL — ABNORMAL LOW (ref 13.0–17.0)
Potassium: 4.5 mmol/L (ref 3.5–5.1)
Potassium: 4.5 mmol/L (ref 3.5–5.1)
Potassium: 4.5 mmol/L (ref 3.5–5.1)
Potassium: 5.7 mmol/L — ABNORMAL HIGH (ref 3.5–5.1)
Potassium: 4.8 mmol/L (ref 3.5–5.1)
Sodium: 139 mmol/L (ref 135–145)
Sodium: 138 mmol/L (ref 135–145)
Sodium: 140 mmol/L (ref 135–145)
Sodium: 137 mmol/L (ref 135–145)
Sodium: 138 mmol/L (ref 135–145)
TCO2: 26 mmol/L (ref 22–32)
TCO2: 25 mmol/L (ref 22–32)
TCO2: 26 mmol/L (ref 22–32)
TCO2: 26 mmol/L (ref 22–32)
TCO2: 26 mmol/L (ref 22–32)

## 2019-05-01 LAB — POCT I-STAT 7, (LYTES, BLD GAS, ICA,H+H)
Acid-Base Excess: 1 mmol/L (ref 0.0–2.0)
Acid-Base Excess: 1 mmol/L (ref 0.0–2.0)
Acid-base deficit: 1 mmol/L (ref 0.0–2.0)
Acid-base deficit: 4 mmol/L — ABNORMAL HIGH (ref 0.0–2.0)
Bicarbonate: 24.9 mmol/L (ref 20.0–28.0)
Bicarbonate: 26.5 mmol/L (ref 20.0–28.0)
Bicarbonate: 26.3 mmol/L (ref 20.0–28.0)
Bicarbonate: 24.3 mmol/L (ref 20.0–28.0)
Calcium, Ion: 1.24 mmol/L (ref 1.15–1.40)
Calcium, Ion: 0.9 mmol/L — ABNORMAL LOW (ref 1.15–1.40)
Calcium, Ion: 1.08 mmol/L — ABNORMAL LOW (ref 1.15–1.40)
Calcium, Ion: 1.29 mmol/L (ref 1.15–1.40)
HCT: 36 % — ABNORMAL LOW (ref 39.0–52.0)
HCT: 27 % — ABNORMAL LOW (ref 39.0–52.0)
HCT: 28 % — ABNORMAL LOW (ref 39.0–52.0)
HCT: 37 % — ABNORMAL LOW (ref 39.0–52.0)
Hemoglobin: 12.2 g/dL — ABNORMAL LOW (ref 13.0–17.0)
Hemoglobin: 9.2 g/dL — ABNORMAL LOW (ref 13.0–17.0)
Hemoglobin: 9.5 g/dL — ABNORMAL LOW (ref 13.0–17.0)
Hemoglobin: 12.6 g/dL — ABNORMAL LOW (ref 13.0–17.0)
O2 Saturation: 100 %
O2 Saturation: 100 %
O2 Saturation: 100 %
O2 Saturation: 100 %
Potassium: 4.4 mmol/L (ref 3.5–5.1)
Potassium: 4.5 mmol/L (ref 3.5–5.1)
Potassium: 5.6 mmol/L — ABNORMAL HIGH (ref 3.5–5.1)
Potassium: 4.8 mmol/L (ref 3.5–5.1)
Sodium: 139 mmol/L (ref 135–145)
Sodium: 141 mmol/L (ref 135–145)
Sodium: 138 mmol/L (ref 135–145)
Sodium: 139 mmol/L (ref 135–145)
TCO2: 26 mmol/L (ref 22–32)
TCO2: 28 mmol/L (ref 22–32)
TCO2: 28 mmol/L (ref 22–32)
TCO2: 26 mmol/L (ref 22–32)
pCO2 arterial: 45.5 mmHg (ref 32.0–48.0)
pCO2 arterial: 46.3 mmHg (ref 32.0–48.0)
pCO2 arterial: 42.4 mmHg (ref 32.0–48.0)
pCO2 arterial: 56 mmHg — ABNORMAL HIGH (ref 32.0–48.0)
pH, Arterial: 7.346 — ABNORMAL LOW (ref 7.350–7.450)
pH, Arterial: 7.366 (ref 7.350–7.450)
pH, Arterial: 7.401 (ref 7.350–7.450)
pH, Arterial: 7.245 — ABNORMAL LOW (ref 7.350–7.450)
pO2, Arterial: 288 mmHg — ABNORMAL HIGH (ref 83.0–108.0)
pO2, Arterial: 342 mmHg — ABNORMAL HIGH (ref 83.0–108.0)
pO2, Arterial: 336 mmHg — ABNORMAL HIGH (ref 83.0–108.0)
pO2, Arterial: 226 mmHg — ABNORMAL HIGH (ref 83.0–108.0)

## 2019-05-01 LAB — CBC
HCT: 36 % — ABNORMAL LOW (ref 39.0–52.0)
HCT: 35.5 % — ABNORMAL LOW (ref 39.0–52.0)
Hemoglobin: 11.6 g/dL — ABNORMAL LOW (ref 13.0–17.0)
Hemoglobin: 11 g/dL — ABNORMAL LOW (ref 13.0–17.0)
MCH: 27.4 pg (ref 26.0–34.0)
MCH: 27.2 pg (ref 26.0–34.0)
MCHC: 32.2 g/dL (ref 30.0–36.0)
MCHC: 31 g/dL (ref 30.0–36.0)
MCV: 85.1 fL (ref 80.0–100.0)
MCV: 87.9 fL (ref 80.0–100.0)
Platelets: 198 10*3/uL (ref 150–400)
Platelets: 199 10*3/uL (ref 150–400)
RBC: 4.23 MIL/uL (ref 4.22–5.81)
RBC: 4.04 MIL/uL — ABNORMAL LOW (ref 4.22–5.81)
RDW: 14.7 % (ref 11.5–15.5)
RDW: 14.9 % (ref 11.5–15.5)
WBC: 13.4 10*3/uL — ABNORMAL HIGH (ref 4.0–10.5)
WBC: 11.5 10*3/uL — ABNORMAL HIGH (ref 4.0–10.5)
nRBC: 0 % (ref 0.0–0.2)
nRBC: 0 % (ref 0.0–0.2)

## 2019-05-01 LAB — BASIC METABOLIC PANEL
Anion gap: 10 (ref 5–15)
Anion gap: 8 (ref 5–15)
BUN: 12 mg/dL (ref 8–23)
BUN: 16 mg/dL (ref 8–23)
CO2: 21 mmol/L — ABNORMAL LOW (ref 22–32)
CO2: 24 mmol/L (ref 22–32)
Calcium: 8.4 mg/dL — ABNORMAL LOW (ref 8.9–10.3)
Calcium: 8.4 mg/dL — ABNORMAL LOW (ref 8.9–10.3)
Chloride: 106 mmol/L (ref 98–111)
Chloride: 103 mmol/L (ref 98–111)
Creatinine, Ser: 0.95 mg/dL (ref 0.61–1.24)
Creatinine, Ser: 1.2 mg/dL (ref 0.61–1.24)
GFR calc Af Amer: >60 mL/min (ref >60)
GFR calc Af Amer: >60 mL/min (ref >60)
GFR calc non Af Amer: >60 mL/min (ref >60)
GFR calc non Af Amer: 59 mL/min — ABNORMAL LOW (ref >60)
Glucose, Bld: 130 mg/dL — ABNORMAL HIGH (ref 70–99)
Glucose, Bld: 327 mg/dL — ABNORMAL HIGH (ref 70–99)
Potassium: 4.5 mmol/L (ref 3.5–5.1)
Potassium: 4.4 mmol/L (ref 3.5–5.1)
Sodium: 137 mmol/L (ref 135–145)
Sodium: 135 mmol/L (ref 135–145)

## 2019-05-01 LAB — SURGICAL PATHOLOGY

## 2019-05-01 LAB — GLUCOSE, CAPILLARY
Glucose-Capillary: 123 mg/dL — ABNORMAL HIGH (ref 70–99)
Glucose-Capillary: 131 mg/dL — ABNORMAL HIGH (ref 70–99)
Glucose-Capillary: 137 mg/dL — ABNORMAL HIGH (ref 70–99)
Glucose-Capillary: 140 mg/dL — ABNORMAL HIGH (ref 70–99)
Glucose-Capillary: 140 mg/dL — ABNORMAL HIGH (ref 70–99)
Glucose-Capillary: 223 mg/dL — ABNORMAL HIGH (ref 70–99)
Glucose-Capillary: 245 mg/dL — ABNORMAL HIGH (ref 70–99)
Glucose-Capillary: 259 mg/dL — ABNORMAL HIGH (ref 70–99)
Glucose-Capillary: 276 mg/dL — ABNORMAL HIGH (ref 70–99)

## 2019-05-01 LAB — MAGNESIUM
Magnesium: 2.3 mg/dL (ref 1.7–2.4)
Magnesium: 2.3 mg/dL (ref 1.7–2.4)

## 2019-05-01 MED ORDER — CARVEDILOL 3.125 MG PO TABS
3.1250 mg | ORAL_TABLET | Freq: Two times a day (BID) | ORAL | Status: DC
  Administered 2019-05-01 – 2019-05-04 (×7): 3.125 mg via ORAL
  Filled 2019-05-01 (×7): qty 1

## 2019-05-01 MED ORDER — INSULIN ASPART 100 UNIT/ML ~~LOC~~ SOLN
0.0000 [IU] | Freq: Three times a day (TID) | SUBCUTANEOUS | Status: DC
  Administered 2019-05-01: 12 [IU] via SUBCUTANEOUS
  Administered 2019-05-01: 2 [IU] via SUBCUTANEOUS
  Administered 2019-05-01 – 2019-05-02 (×2): 12 [IU] via SUBCUTANEOUS

## 2019-05-01 MED ORDER — FUROSEMIDE 10 MG/ML IJ SOLN
20.0000 mg | Freq: Two times a day (BID) | INTRAMUSCULAR | Status: AC
  Administered 2019-05-01 (×2): 20 mg via INTRAVENOUS
  Filled 2019-05-01 (×2): qty 2

## 2019-05-01 MED ORDER — AMLODIPINE BESYLATE 5 MG PO TABS
5.0000 mg | ORAL_TABLET | Freq: Once | ORAL | Status: AC
  Administered 2019-05-01: 5 mg via ORAL
  Filled 2019-05-01: qty 1

## 2019-05-01 MED ORDER — AMLODIPINE BESYLATE 5 MG PO TABS
5.0000 mg | ORAL_TABLET | Freq: Every day | ORAL | Status: DC
  Administered 2019-05-01: 5 mg via ORAL
  Filled 2019-05-01: qty 1

## 2019-05-01 MED ORDER — CLONIDINE HCL 0.2 MG PO TABS
0.3000 mg | ORAL_TABLET | Freq: Three times a day (TID) | ORAL | Status: DC
  Administered 2019-05-01 – 2019-05-04 (×9): 0.3 mg via ORAL
  Filled 2019-05-01 (×9): qty 1

## 2019-05-01 MED ORDER — DOXAZOSIN MESYLATE 2 MG PO TABS
2.0000 mg | ORAL_TABLET | Freq: Every day | ORAL | Status: DC
  Administered 2019-05-02 – 2019-05-03 (×2): 2 mg via ORAL
  Filled 2019-05-01 (×3): qty 1

## 2019-05-01 MED ORDER — DOXAZOSIN MESYLATE 2 MG PO TABS
2.0000 mg | ORAL_TABLET | Freq: Every day | ORAL | Status: DC
  Administered 2019-05-01: 2 mg via ORAL
  Filled 2019-05-01: qty 1

## 2019-05-01 MED ORDER — AMLODIPINE BESYLATE 10 MG PO TABS
10.0000 mg | ORAL_TABLET | Freq: Every day | ORAL | Status: DC
  Administered 2019-05-02 – 2019-05-04 (×3): 10 mg via ORAL
  Filled 2019-05-01 (×3): qty 1

## 2019-05-01 MED FILL — Heparin Sodium (Porcine) Inj 1000 Unit/ML: INTRAMUSCULAR | Qty: 30 | Status: AC

## 2019-05-01 MED FILL — Potassium Chloride Inj 2 mEq/ML: INTRAVENOUS | Qty: 40 | Status: AC

## 2019-05-01 MED FILL — Magnesium Sulfate Inj 50%: INTRAMUSCULAR | Qty: 10 | Status: AC

## 2019-05-01 NOTE — Progress Notes (Addendum)
TCTS DAILY ICU PROGRESS NOTE                   [address]            [place] [address]          [phone]   1 Day Post-Op Procedure(s) (LRB): CORONARY ARTERY BYPASS GRAFTING (CABG) TIMES THREE USING LEFT INTERNAL MAMMARY AND RIGHT GREATER SAPHENOUS VEIN HARVESTED ENDOSCOPICALLY. MAMMARY ARTERY TO LAD, SAPHENOUS VEIN GRAFT TO  OM1, SAPHENOUS VEING GRAFT TO RIGHT. BIOPSY OF MEDIASTINAL TISSUE PLACEMENT OF RIGHT FEMORAL A-LINE (N/A) TRANSESOPHAGEAL ECHOCARDIOGRAM (TEE) (N/A)  Total Length of Stay:  LOS: 4 days   Subjective: Patient with incisional, sternal pain this am.   Objective: Vital signs in last 24 hours: Temp:  [96.4 F (35.8 C)-99.3 F (37.4 C)] 99 F (37.2 C) (03/30 0700) Pulse Rate:  [81-99] 93 (03/30 0700) Cardiac Rhythm: Normal sinus rhythm (03/30 0400) Resp:  [14-19] 19 (03/30 0700) BP: (99-154)/(55-79) 123/70 (03/30 0600) SpO2:  [92 %-96 %] 92 % (03/30 0700) Arterial Line BP: (92-193)/(53-146) 165/61 (03/30 0700) FiO2 (%):  [40 %-50 %] 40 % (03/29 1702) Weight:  [96.7 kg] 96.7 kg (03/30 0500)  Filed Weights   04/29/19 0458 04/30/19 0432 05/01/19 0500  Weight: 90.9 kg 90.8 kg 96.7 kg    Weight change: 5.89 kg   Hemodynamic parameters for last 24 hours: PAP: (19-35)/(7-18) 25/9 CVP:  [1 mmHg-9 mmHg] 3 mmHg CO:  [4.1 L/min-6.7 L/min] 6.2 L/min CI:  [2 L/min/m2-3.2 L/min/m2] 3 L/min/m2  Intake/Output from previous day: 03/29 0701 - 03/30 0700 In: 4643.7 [P.O.:50; I.V.:3582.3; Blood:169; IV Piggyback:842.4] Out: 8032 [Urine:3455; Blood:280; Chest Tube:400]  Intake/Output this shift: No intake/output data recorded.  Current Meds: Scheduled Meds: . acetaminophen  1,000 mg Oral Q6H   Or  . acetaminophen (TYLENOL) oral liquid 160 mg/5 mL  1,000 mg Per Tube Q6H  . aspirin EC  325 mg Oral Daily   Or  . aspirin  324 mg Per Tube Daily  . bisacodyl  10 mg Oral Daily   Or  . bisacodyl  10 mg Rectal Daily  . chlorhexidine gluconate  (MEDLINE KIT)  15 mL Mouth Rinse BID  . Chlorhexidine Gluconate Cloth  6 each Topical Daily  . Chlorhexidine Gluconate Cloth  6 each Topical Daily  . darifenacin  7.5 mg Oral Daily  . docusate sodium  200 mg Oral Daily  . DULoxetine  60 mg Oral Daily  . mouth rinse  15 mL Mouth Rinse BID  . metoprolol tartrate  12.5 mg Oral BID   Or  . metoprolol tartrate  12.5 mg Per Tube BID  . [START ON 05/02/2019] pantoprazole  40 mg Oral Daily  . rosuvastatin  40 mg Oral Daily  . sodium chloride flush  10-40 mL Intracatheter Q12H  . sodium chloride flush  3 mL Intravenous Q12H   Continuous Infusions: . sodium chloride 20 mL/hr at 05/01/19 0700  . sodium chloride    . sodium chloride 20 mL/hr at 05/01/19 0400  . cefUROXime (ZINACEF)  IV Stopped (05/01/19 0421)  . dexmedetomidine (PRECEDEX) IV infusion Stopped (04/30/19 1527)  . DOPamine Stopped (04/30/19 1533)  . insulin 2 mL/hr at 05/01/19 0700  . lactated ringers    . lactated ringers    . lactated ringers Stopped (05/01/19 1224)  . nitroGLYCERIN 5 mcg/min (05/01/19 0700)  . phenylephrine (NEO-SYNEPHRINE) Adult infusion Stopped (04/30/19 1720)   PRN Meds:.sodium chloride, calcium carbonate, dextrose, lactated ringers, metoprolol  tartrate, midazolam, morphine injection, ondansetron (ZOFRAN) IV, oxyCODONE, sodium chloride flush, sodium chloride flush, traMADol  General appearance: alert, cooperative and no distress Neurologic: intact Heart: RRR Lungs: Slightly diminished bibasilar breath sounds Abdomen: Soft, non tender, sporadic bowel sounds this am Extremities: Mild LE edema R>L Wound: Aquacel intact. RLE dressings are clean and dry. Minor dried bloody ooze at lower "stab" wound  Lab Results: CBC: Recent Labs    04/30/19 1955 04/30/19 1955 04/30/19 2001 05/01/19 0352  WBC 15.5*  --   --  13.4*  HGB 12.3*   < > 12.2* 11.6*  HCT 38.6*   < > 36.0* 36.0*  PLT 211  --   --  198   < > = values in this interval not displayed.    BMET:  Recent Labs    04/30/19 1955 04/30/19 1955 04/30/19 2001 05/01/19 0352  NA 137   < > 139 137  K 4.9   < > 4.7 4.5  CL 108  --   --  106  CO2 21*  --   --  21*  GLUCOSE 146*  --   --  130*  BUN 16  --   --  12  CREATININE 0.95  --   --  0.95  CALCIUM 8.3*  --   --  8.4*   < > = values in this interval not displayed.    CMET: Lab Results  Component Value Date   WBC 13.4 (H) 05/01/2019   HGB 11.6 (L) 05/01/2019   HCT 36.0 (L) 05/01/2019   PLT 198 05/01/2019   GLUCOSE 130 (H) 05/01/2019   CHOL 93 04/28/2019   TRIG 167 (H) 04/28/2019   HDL 37 (L) 04/28/2019   LDLCALC 23 04/28/2019   ALT 33 04/28/2019   AST 27 04/28/2019   NA 137 05/01/2019   K 4.5 05/01/2019   CL 106 05/01/2019   CREATININE 0.95 05/01/2019   BUN 12 05/01/2019   CO2 21 (L) 05/01/2019   INR 1.3 (H) 04/30/2019   HGBA1C 7.2 (H) 04/28/2019      PT/INR:  Recent Labs    04/30/19 1334  LABPROT 15.8*  INR 1.3*   Radiology: DG Chest Port 1 View  Result Date: 04/30/2019 CLINICAL DATA:  CABG. Z95.1 EXAM: PORTABLE CHEST 1 VIEW COMPARISON:  Chest x-ray dated 04/29/2019 and CT scan dated 12/22/2018 FINDINGS: Endotracheal tube in good position at the thoracic inlet. Tip of the Swan-Ganz catheter is at the main pulmonary artery. NG tube tip is below the diaphragm. Two chest tubes in place. No pneumothorax. Prominence of the superior mediastinum is felt to represent a combination of prominent mediastinal fat and postoperative change. There is focal atelectasis at the left lung base medially. Right lung is clear. Pulmonary vascularity is normal. Cardiac size is normal. IMPRESSION: Atelectasis at the left lung base medially. No pneumothorax. Slight increased prominence of the superior mediastinum as described above. Electronically Signed   By: Lorriane Shire M.D.   On: 04/30/2019 13:58   ECHO INTRAOPERATIVE TEE  Result Date: 04/30/2019  *INTRAOPERATIVE TRANSESOPHAGEAL REPORT *  Patient Name:   Cristian Martin  Date of Exam: 04/30/2019 Medical Rec #:  [id number]      Height:       70.0 in Accession #:    [id number]     Weight:       200.2 lb Date of Birth:  [birth year]      BSA:          2.09 m  Patient Age:    74 years       BP:           129/82 mmHg Patient Gender: M              HR:           60 bpm. Exam Location:  Anesthesiology Transesophogeal exam was perform intraoperatively during surgical procedure. Patient was closely monitored under general anesthesia during the entirety of examination. Indications:     Coronary artery disease Sonographer:     Vickie Epley RDCS Performing Phys: [id number] Lilia Argue [id number] Diagnosing Phys: Suzette Battiest MD Complications: No known complications during this procedure. POST-OP IMPRESSIONS - Left Ventricle: The left ventricle is unchanged from pre-bypass. - Right Ventricle: The right ventricle appears unchanged from pre-bypass. - Aorta: The aorta appears unchanged from pre-bypass. - Left Atrium: The left atrium appears unchanged from pre-bypass. - Left Atrial Appendage: The left atrial appendage appears unchanged from pre-bypass. - Aortic Valve: The aortic valve appears unchanged from pre-bypass. - Mitral Valve: The mitral valve appears unchanged from pre-bypass. - Tricuspid Valve: The tricuspid valve appears unchanged from pre-bypass. - Interatrial Septum: The interatrial septum appears unchanged from pre-bypass. - Interventricular Septum: The interventricular septum appears unchanged from pre-bypass. - Pericardium: The pericardium appears unchanged from pre-bypass. PRE-OP FINDINGS  Left Ventricle: The left ventricle has normal systolic function, with an ejection fraction of 55-60%. The cavity size was normal. There is mild concentric left ventricular hypertrophy. Right Ventricle: The right ventricle has normal systolic function. The cavity was normal. There is no increase in right ventricular wall thickness. Left Atrium: Left atrial size was normal in size. Right Atrium: Right atrial  size was normal in size. Right atrial pressure is estimated at 10 mmHg. Interatrial Septum: Possible small PFO by color doppler. There is right bowing of the interatrial septum, suggestive of elevated left atrial pressure. Pericardium: There is no evidence of pericardial effusion. Mitral Valve: The mitral valve is normal in structure. Mild thickening of the mitral valve leaflet. Mild calcification of the mitral valve leaflet. Mitral valve regurgitation is not visualized by color flow Doppler. Tricuspid Valve: The tricuspid valve was normal in structure. Tricuspid valve regurgitation was not visualized by color flow Doppler. Aortic Valve: Tricuspid aortic valve. Moderate Left coronary cusp restriction There is mild thickening of the aortic valve and There is moderate calcification of the aortic valve Aortic valve regurgitation was not visualized by color flow Doppler. There is no stenosis of the aortic valve. Pulmonic Valve: The pulmonic valve was normal in structure. Pulmonic valve regurgitation is not visualized by color flow Doppler. Aorta: The aortic root, ascending aorta and aortic arch are normal in size and structure. +--------------+--------++ LEFT VENTRICLE         +--------------+--------++ PLAX 2D                +--------------+--------++ LVOT diam:    2.10 cm  +--------------+--------++ LVOT Area:    3.46 cm +--------------+--------++                        +--------------+--------++ +-------------+-----------++ AORTIC VALVE             +-------------+-----------++ AV Vmax:     133.00 cm/s +-------------+-----------++ AV Vmean:    89.600 cm/s +-------------+-----------++ AV VTI:      0.292 m     +-------------+-----------++ AV Peak Grad:7.1 mmHg    +-------------+-----------++ AV Mean Grad:4.0 mmHg    +-------------+-----------++  +--------------+-------+ SHUNTS                +--------------+-------+  Systemic Diam:2.10 cm +--------------+-------+  Suzette Battiest MD Electronically signed by Suzette Battiest MD Signature Date/Time: 04/30/2019/3:29:32 PM    Final      Assessment/Plan: S/P Procedure(s) (LRB): CORONARY ARTERY BYPASS GRAFTING (CABG) TIMES THREE USING LEFT INTERNAL MAMMARY AND RIGHT GREATER SAPHENOUS VEIN HARVESTED ENDOSCOPICALLY. MAMMARY ARTERY TO LAD, SAPHENOUS VEIN GRAFT TO  OM1, SAPHENOUS VEING GRAFT TO RIGHT. BIOPSY OF MEDIASTINAL TISSUE PLACEMENT OF RIGHT FEMORAL A-LINE (N/A) TRANSESOPHAGEAL ECHOCARDIOGRAM (TEE) (N/A)   1. CV-SR, hypertensive this am. On Nitro drip at 5 mcg/min Lopressor 12.5 mg bid.  Will restart Clonidine and Amlodipine, wean off Nitro. Will restart Losartan in next day or so, provided creatinine remains stable. As discussed with Dr. Servando Snare , will stop Lopressor and restart Coreg as taken prior to surgery. 2. Pulmonary-on 3 liters of oxygen via . Wean as able over next few days. Chest tubes with 400 cc since surgery. CXR this am appears to show low lung volumes, small pleural effusions, prominent superior mediastinum.Per Dr. Servando Snare, remove chest tubes.  Encourage incentive spirometer. 3. Volume overload-will give Lasix 20 mg IV bid today 5. Expected post op blood loss anemia-H and H this am 11.6 and 36. 6. DM-CBGs131/123/137. On Insulin drip;will wean off. Prior to surgery, he was on Novolog mix 70/30 60 units bid, Metformin XR 1000 mg every am and at bedtime, Semaglutide 1 mg every Sunday. Will transition to scheduled Insulin and not restart Metformin until tolerating po better and creatinine remains WNL. Pre op HGA1C 7.2. 7. History of overactive bladder-will restart Tropium chloride in am. Foley to remain for today 8. Regarding pain control, Morphine, Oxy PRN, and Ultram PRN 9. Please see progression orders   Donielle Liston Alba PA-C 05/01/2019 7:05 AM   Awake and alert , neuro intact  Patient has had prominent on xray previously - noted on previous ct and chest xray  D/c chest tubes  Expected  Acute  Blood - loss Anemia- continue to monitor  I have seen and examined Cristian Martin and agree with the above assessment  and plan.  Grace Isaac MD Beeper [phone] Office [phone] 05/01/2019 8:04 AM

## 2019-05-01 NOTE — Progress Notes (Signed)
Patient ID: Cristian Martin, male   DOB: 1945-02-15, 74 y.o.   MRN: [id number] EVENING ROUNDS NOTE :     [address]       [place] [address]             [phone]                 1 Day Post-Op Procedure(s) (LRB): CORONARY ARTERY BYPASS GRAFTING (CABG) TIMES THREE USING LEFT INTERNAL MAMMARY AND RIGHT GREATER SAPHENOUS VEIN HARVESTED ENDOSCOPICALLY. MAMMARY ARTERY TO LAD, SAPHENOUS VEIN GRAFT TO  OM1, SAPHENOUS VEING GRAFT TO RIGHT. BIOPSY OF MEDIASTINAL TISSUE PLACEMENT OF RIGHT FEMORAL A-LINE (N/A) TRANSESOPHAGEAL ECHOCARDIOGRAM (TEE) (N/A)  Total Length of Stay:  LOS: 4 days  BP (!) 138/122   Pulse 94   Temp 98.1 F (36.7 C) (Oral)   Resp 20   Ht 5\' 10"  (1.778 m)   Wt 96.7 kg   SpO2 93%   BMI 30.59 kg/m   .Intake/Output      03/29 0701 - 03/30 0700 03/30 0701 - 03/31 0700   P.O. 50 720   I.V. (mL/kg) 3582.3 (37) 103.5 (1.1)   Blood 169    IV Piggyback 842.4    Total Intake(mL/kg) 4643.7 (48) 823.5 (8.5)   Urine (mL/kg/hr) 3455 (1.5) 1810 (1.8)   Blood 280    Chest Tube 400 90   Total Output 4135 1900   Net +508.7 -1076.5          . sodium chloride Stopped (05/01/19 0947)  . sodium chloride    . sodium chloride 20 mL/hr at 05/01/19 0400  . cefUROXime (ZINACEF)  IV 1.5 g (05/01/19 1650)  . dexmedetomidine (PRECEDEX) IV infusion Stopped (04/30/19 1527)  . DOPamine Stopped (04/30/19 1533)  . lactated ringers    . lactated ringers    . lactated ringers Stopped (05/01/19 [id number])  . nitroGLYCERIN Stopped (05/01/19 1018)  . phenylephrine (NEO-SYNEPHRINE) Adult infusion Stopped (04/30/19 1720)     Lab Results  Component Value Date   WBC 13.4 (H) 05/01/2019   HGB 11.6 (L) 05/01/2019   HCT 36.0 (L) 05/01/2019   PLT 198 05/01/2019   GLUCOSE 130 (H) 05/01/2019   CHOL 93 04/28/2019   TRIG 167 (H) 04/28/2019   HDL 37 (L) 04/28/2019   LDLCALC 23 04/28/2019   ALT 33 04/28/2019   AST 27 04/28/2019   NA 137 05/01/2019   K 4.5 05/01/2019   CL 106  05/01/2019   CREATININE 0.95 05/01/2019   BUN 12 05/01/2019   CO2 21 (L) 05/01/2019   INR 1.3 (H) 04/30/2019   HGBA1C 7.2 (H) 04/28/2019   Stable day Stable walked arount unit    Grace Isaac MD  Beeper [phone] Office [phone] 05/01/2019 5:35 PM

## 2019-05-02 ENCOUNTER — Inpatient Hospital Stay (HOSPITAL_COMMUNITY): Payer: No Typology Code available for payment source

## 2019-05-02 LAB — CBC
HCT: 33.2 % — ABNORMAL LOW (ref 39.0–52.0)
Hemoglobin: 10.4 g/dL — ABNORMAL LOW (ref 13.0–17.0)
MCH: 27.2 pg (ref 26.0–34.0)
MCHC: 31.3 g/dL (ref 30.0–36.0)
MCV: 86.9 fL (ref 80.0–100.0)
Platelets: 163 10*3/uL (ref 150–400)
RBC: 3.82 MIL/uL — ABNORMAL LOW (ref 4.22–5.81)
RDW: 14.9 % (ref 11.5–15.5)
WBC: 11.4 10*3/uL — ABNORMAL HIGH (ref 4.0–10.5)
nRBC: 0 % (ref 0.0–0.2)

## 2019-05-02 LAB — BASIC METABOLIC PANEL
Anion gap: 11 (ref 5–15)
BUN: 15 mg/dL (ref 8–23)
CO2: 23 mmol/L (ref 22–32)
Calcium: 8.3 mg/dL — ABNORMAL LOW (ref 8.9–10.3)
Chloride: 101 mmol/L (ref 98–111)
Creatinine, Ser: 1.05 mg/dL (ref 0.61–1.24)
GFR calc Af Amer: >60 mL/min (ref >60)
GFR calc non Af Amer: >60 mL/min (ref >60)
Glucose, Bld: 222 mg/dL — ABNORMAL HIGH (ref 70–99)
Potassium: 3.9 mmol/L (ref 3.5–5.1)
Sodium: 135 mmol/L (ref 135–145)

## 2019-05-02 LAB — GLUCOSE, CAPILLARY
Glucose-Capillary: 218 mg/dL — ABNORMAL HIGH (ref 70–99)
Glucose-Capillary: 294 mg/dL — ABNORMAL HIGH (ref 70–99)
Glucose-Capillary: 202 mg/dL — ABNORMAL HIGH (ref 70–99)
Glucose-Capillary: 181 mg/dL — ABNORMAL HIGH (ref 70–99)
Glucose-Capillary: 212 mg/dL — ABNORMAL HIGH (ref 70–99)

## 2019-05-02 MED ORDER — ~~LOC~~ CARDIAC SURGERY, PATIENT & FAMILY EDUCATION
Freq: Once | Status: AC
  Administered 2019-05-02: 1

## 2019-05-02 MED ORDER — ENOXAPARIN SODIUM 40 MG/0.4ML ~~LOC~~ SOLN
40.0000 mg | SUBCUTANEOUS | Status: DC
  Administered 2019-05-02 – 2019-05-03 (×2): 40 mg via SUBCUTANEOUS
  Filled 2019-05-02 (×3): qty 0.4

## 2019-05-02 MED ORDER — SODIUM CHLORIDE 0.9 % IV SOLN: 250.0000 mL | INTRAVENOUS | Status: DC | PRN

## 2019-05-02 MED ORDER — MAGNESIUM HYDROXIDE 400 MG/5ML PO SUSP: 30.0000 mL | Freq: Every day | ORAL | Status: DC | PRN

## 2019-05-02 MED ORDER — INSULIN ASPART PROT & ASPART (70-30 MIX) 100 UNIT/ML ~~LOC~~ SUSP
60.0000 [IU] | Freq: Two times a day (BID) | SUBCUTANEOUS | Status: DC
  Administered 2019-05-02: 60 [IU] via SUBCUTANEOUS
  Filled 2019-05-02: qty 10

## 2019-05-02 MED ORDER — TRAMADOL HCL 50 MG PO TABS
50.0000 mg | ORAL_TABLET | ORAL | Status: DC | PRN
  Administered 2019-05-02: 50 mg via ORAL
  Filled 2019-05-02: qty 1

## 2019-05-02 MED ORDER — ASPIRIN EC 325 MG PO TBEC
325.0000 mg | DELAYED_RELEASE_TABLET | Freq: Every day | ORAL | Status: DC
  Administered 2019-05-02 – 2019-05-04 (×3): 325 mg via ORAL
  Filled 2019-05-02 (×2): qty 1

## 2019-05-02 MED ORDER — BISACODYL 5 MG PO TBEC
10.0000 mg | DELAYED_RELEASE_TABLET | Freq: Every day | ORAL | Status: DC | PRN
  Administered 2019-05-02: 10 mg via ORAL
  Filled 2019-05-02: qty 2

## 2019-05-02 MED ORDER — PANTOPRAZOLE SODIUM 40 MG PO TBEC
40.0000 mg | DELAYED_RELEASE_TABLET | Freq: Every day | ORAL | Status: DC
  Administered 2019-05-02 – 2019-05-04 (×3): 40 mg via ORAL
  Filled 2019-05-02 (×2): qty 1

## 2019-05-02 MED ORDER — DOCUSATE SODIUM 100 MG PO CAPS
200.0000 mg | ORAL_CAPSULE | Freq: Every day | ORAL | Status: DC
  Administered 2019-05-02 – 2019-05-03 (×2): 200 mg via ORAL
  Filled 2019-05-02: qty 2

## 2019-05-02 MED ORDER — POTASSIUM CHLORIDE CRYS ER 10 MEQ PO TBCR
10.0000 meq | EXTENDED_RELEASE_TABLET | Freq: Two times a day (BID) | ORAL | Status: DC
  Administered 2019-05-02 – 2019-05-03 (×4): 10 meq via ORAL
  Filled 2019-05-02 (×4): qty 1

## 2019-05-02 MED ORDER — BISACODYL 10 MG RE SUPP: 10.0000 mg | Freq: Every day | RECTAL | Status: DC | PRN

## 2019-05-02 MED ORDER — SODIUM CHLORIDE 0.9% FLUSH: 3.0000 mL | INTRAVENOUS | Status: DC | PRN

## 2019-05-02 MED ORDER — SODIUM CHLORIDE 0.9% FLUSH
3.0000 mL | Freq: Two times a day (BID) | INTRAVENOUS | Status: DC
  Administered 2019-05-02 – 2019-05-03 (×4): 3 mL via INTRAVENOUS

## 2019-05-02 MED ORDER — LACTULOSE 10 GM/15ML PO SOLN: 10.0000 g | Freq: Every day | ORAL | Status: DC | PRN

## 2019-05-02 MED ORDER — METFORMIN HCL ER 500 MG PO TB24
1000.0000 mg | ORAL_TABLET | Freq: Every day | ORAL | Status: DC
  Administered 2019-05-02 – 2019-05-04 (×3): 1000 mg via ORAL
  Filled 2019-05-02 (×4): qty 2

## 2019-05-02 MED ORDER — GUAIFENESIN ER 600 MG PO TB12
600.0000 mg | ORAL_TABLET | Freq: Two times a day (BID) | ORAL | Status: DC | PRN
  Administered 2019-05-03: 600 mg via ORAL
  Filled 2019-05-02: qty 1

## 2019-05-02 MED ORDER — CHLORHEXIDINE GLUCONATE CLOTH 2 % EX PADS
6.0000 | MEDICATED_PAD | Freq: Every day | CUTANEOUS | Status: DC
  Administered 2019-05-02: 6 via TOPICAL

## 2019-05-02 MED ORDER — ONDANSETRON HCL 4 MG/2ML IJ SOLN: 4.0000 mg | Freq: Four times a day (QID) | INTRAMUSCULAR | Status: DC | PRN

## 2019-05-02 MED ORDER — FUROSEMIDE 20 MG PO TABS
20.0000 mg | ORAL_TABLET | Freq: Every day | ORAL | Status: DC
  Administered 2019-05-02 – 2019-05-04 (×3): 20 mg via ORAL
  Filled 2019-05-02 (×3): qty 1

## 2019-05-02 MED ORDER — INSULIN ASPART PROT & ASPART (70-30 MIX) 100 UNIT/ML PEN: 60.0000 [IU] | PEN_INJECTOR | Freq: Two times a day (BID) | SUBCUTANEOUS | Status: DC

## 2019-05-02 MED ORDER — ACETAMINOPHEN 325 MG PO TABS
650.0000 mg | ORAL_TABLET | Freq: Four times a day (QID) | ORAL | Status: DC | PRN
  Administered 2019-05-02 – 2019-05-03 (×2): 650 mg via ORAL
  Filled 2019-05-02 (×2): qty 2

## 2019-05-02 MED ORDER — ONDANSETRON HCL 4 MG PO TABS: 4.0000 mg | ORAL_TABLET | Freq: Four times a day (QID) | ORAL | Status: DC | PRN

## 2019-05-02 MED ORDER — INSULIN ASPART 100 UNIT/ML ~~LOC~~ SOLN
0.0000 [IU] | Freq: Three times a day (TID) | SUBCUTANEOUS | Status: DC
  Administered 2019-05-02 (×2): 8 [IU] via SUBCUTANEOUS
  Administered 2019-05-02 – 2019-05-03 (×2): 4 [IU] via SUBCUTANEOUS
  Administered 2019-05-03: 8 [IU] via SUBCUTANEOUS
  Administered 2019-05-03: 4 [IU] via SUBCUTANEOUS
  Administered 2019-05-03: 2 [IU] via SUBCUTANEOUS
  Administered 2019-05-04: 4 [IU] via SUBCUTANEOUS

## 2019-05-02 MED ORDER — SEMAGLUTIDE (1 MG/DOSE) 2 MG/1.5ML ~~LOC~~ SOPN: 1.0000 mg | PEN_INJECTOR | SUBCUTANEOUS | Status: DC

## 2019-05-02 MED FILL — Electrolyte-R (PH 7.4) Solution: INTRAVENOUS | Qty: 4000 | Status: AC

## 2019-05-02 MED FILL — Sodium Chloride IV Soln 0.9%: INTRAVENOUS | Qty: 2000 | Status: AC

## 2019-05-02 MED FILL — Sodium Bicarbonate IV Soln 8.4%: INTRAVENOUS | Qty: 50 | Status: AC

## 2019-05-02 MED FILL — Mannitol IV Soln 20%: INTRAVENOUS | Qty: 500 | Status: AC

## 2019-05-02 MED FILL — Heparin Sodium (Porcine) Inj 1000 Unit/ML: INTRAMUSCULAR | Qty: 10 | Status: AC

## 2019-05-02 MED FILL — Lidocaine HCl Local Soln Prefilled Syringe 100 MG/5ML (2%): INTRAMUSCULAR | Qty: 5 | Status: AC

## 2019-05-02 NOTE — Progress Notes (Signed)
CARDIAC REHAB PHASE I   PRE:  Rate/Rhythm: 94 SR  BP:  Supine:   Sitting: 140/76  Standing:    SaO2: 92% 2L  MODE:  Ambulation: 190 ft   POST:  Rate/Rhythm: 108 ST  BP:  Supine:   Sitting: 152/72  Standing:    SaO2: 92% 2L 1317-1350 Pt walked 190 ft on 2L with gait belt use, EVA, and asst x 1. Pt tired and did not feel he could go farther. Had pt use IS when he had recovered from walk and he demonstrated 500-625ml correctly. Encouraged IS and walks to help get off oxygen. Back to recliner with call bell.    Graylon Good, RN BSN  05/02/2019 1:45 PM

## 2019-05-02 NOTE — TOC Initial Note (Signed)
Transition of Care [facility] - Initial/Assessment Note    Patient Details  Name: Cristian Martin MRN: [id number] Date of Birth: [birth year]  Transition of [facility]) CM/SW Contact:    Curlene Labrum, RN Phone Number: 05/02/2019, 9:32 AM  Clinical Narrative:    Case Management called [name], [place] Liaison, to notify [place] of patient's hospital admission date and surgery.  Patient is seen with the [facility].  Dr [name] is PCP provider.  [name] is the patient's [place] social worker - Please call for coordination benefits at [phone],  [phone]).         Patient Goals and CMS Choice        Expected Discharge Plan and Services                                                Prior Living Arrangements/Services                       Activities of Daily Living Home Assistive Devices/Equipment: Eyeglasses ADL Screening (condition at time of admission) Patient's cognitive ability adequate to safely complete daily activities?: Yes Is the patient deaf or have difficulty hearing?: No Does the patient have difficulty seeing, even when wearing glasses/contacts?: No Does the patient have difficulty concentrating, remembering, or making decisions?: No Patient able to express need for assistance with ADLs?: Yes Does the patient have difficulty dressing or bathing?: No Independently performs ADLs?: Yes (appropriate for developmental age) Does the patient have difficulty walking or climbing stairs?: Yes Weakness of Legs: Both Weakness of Arms/Hands: None  Permission Sought/Granted                  Emotional Assessment              Admission diagnosis:  Unstable angina ([place]) [I20.0] Coronary artery disease [I25.10] Patient Active Problem List   Diagnosis Date Noted  . 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 Coronary artery disease 04/30/2019  . Unstable angina ([place]) 04/27/2019  . Malignant neoplasm of prostate ([place]) 04/09/2018   . GERD (gastroesophageal reflux disease) 04/07/2018  . Hx of colonic polyps 04/07/2018  . Benign essential hypertension 12/02/2015  . Hyperlipidemia, mixed 05/02/2015  . Chronic painful diabetic neuropathy ([place]) 03/13/2015  . DM (diabetes mellitus) type II controlled with renal manifestation ([place]) 08/08/2013   PCP:  No primary care provider on file. Pharmacy:   [id number] [name], [place] [address] Phone: [phone] Fax: [phone]     Social Determinants of Health (SDOH) Interventions    Readmission Risk Interventions No flowsheet data found.

## 2019-05-02 NOTE — Progress Notes (Signed)
Patient ID: Cristian Martin, male   DOB: 06-Nov-1945, 74 y.o.   MRN: [id number] . TCTS DAILY ICU PROGRESS NOTE                   [address]            [place] [address]          [phone]   2 Days Post-Op Procedure(s) (LRB): CORONARY ARTERY BYPASS GRAFTING (CABG) TIMES THREE USING LEFT INTERNAL MAMMARY AND RIGHT GREATER SAPHENOUS VEIN HARVESTED ENDOSCOPICALLY. MAMMARY ARTERY TO LAD, SAPHENOUS VEIN GRAFT TO  OM1, SAPHENOUS VEING GRAFT TO RIGHT. BIOPSY OF MEDIASTINAL TISSUE PLACEMENT OF RIGHT FEMORAL A-LINE (N/A) TRANSESOPHAGEAL ECHOCARDIOGRAM (TEE) (N/A)  Total Length of Stay:  LOS: 5 days   Subjective: Patient awake alert neurologically intact, walked in the unit this morning 190 feet without shortness of breath.  Objective: Vital signs in last 24 hours: Temp:  [98.1 F (36.7 C)-99.3 F (37.4 C)] 98.4 F (36.9 C) (03/31 0400) Pulse Rate:  [83-107] 91 (03/31 0600) Cardiac Rhythm: Normal sinus rhythm (03/31 0400) Resp:  [16-26] 23 (03/31 0600) BP: (109-183)/(60-122) 142/74 (03/31 0600) SpO2:  [86 %-96 %] 92 % (03/31 0600) Arterial Line BP: (170-189)/(64-68) 180/68 (03/30 0930)  Filed Weights   04/29/19 0458 04/30/19 0432 05/01/19 0500  Weight: 90.9 kg 90.8 kg 96.7 kg    Weight change:    Hemodynamic parameters for last 24 hours: PAP: (20-29)/(9-15) 24/11  Intake/Output from previous day: 03/30 0701 - 03/31 0700 In: 1303.6 [P.O.:720; I.V.:306.2; IV Piggyback:277.5] Out: [id number] [Urine:3040; Chest Tube:90]  Intake/Output this shift: No intake/output data recorded.  Current Meds: Scheduled Meds: . acetaminophen  1,000 mg Oral Q6H   Or  . acetaminophen (TYLENOL) oral liquid 160 mg/5 mL  1,000 mg Per Tube Q6H  . amLODipine  10 mg Oral Daily  . aspirin EC  325 mg Oral Daily   Or  . aspirin  324 mg Per Tube Daily  . bisacodyl  10 mg Oral Daily   Or  . bisacodyl  10 mg Rectal Daily  . carvedilol  3.125 mg Oral BID WC  . Chlorhexidine Gluconate Cloth   6 each Topical Daily  . Chlorhexidine Gluconate Cloth  6 each Topical Daily  . cloNIDine  0.3 mg Oral TID  . darifenacin  7.5 mg Oral Daily  . docusate sodium  200 mg Oral Daily  . doxazosin  2 mg Oral QHS  . DULoxetine  60 mg Oral Daily  . insulin aspart  0-24 Units Subcutaneous TID WC  . mouth rinse  15 mL Mouth Rinse BID  . pantoprazole  40 mg Oral Daily  . rosuvastatin  40 mg Oral Daily  . sodium chloride flush  10-40 mL Intracatheter Q12H  . sodium chloride flush  3 mL Intravenous Q12H   Continuous Infusions: . sodium chloride Stopped (05/01/19 0947)  . sodium chloride    . sodium chloride 20 mL/hr at 05/01/19 0400  . dexmedetomidine (PRECEDEX) IV infusion Stopped (04/30/19 1527)  . DOPamine Stopped (04/30/19 1533)  . lactated ringers    . lactated ringers    . lactated ringers Stopped (05/02/19 0538)  . nitroGLYCERIN Stopped (05/01/19 1018)  . phenylephrine (NEO-SYNEPHRINE) Adult infusion Stopped (04/30/19 1720)   PRN Meds:.sodium chloride, calcium carbonate, dextrose, lactated ringers, metoprolol tartrate, midazolam, morphine injection, ondansetron (ZOFRAN) IV, oxyCODONE, sodium chloride flush, sodium chloride flush, traMADol  General appearance: alert, cooperative, appears stated age and no distress Neurologic: intact Heart: regular  rate and rhythm, S1, S2 normal, no murmur, click, rub or gallop Lungs: diminished breath sounds bibasilar Abdomen: soft, non-tender; bowel sounds normal; no masses,  no organomegaly Extremities: extremities normal, atraumatic, no cyanosis or edema Wound: Sternum stable  Lab Results: CBC: Recent Labs    05/01/19 1755 05/02/19 0524  WBC 11.5* 11.4*  HGB 11.0* 10.4*  HCT 35.5* 33.2*  PLT 199 163   BMET:  Recent Labs    05/01/19 1755 05/02/19 0524  NA 135 135  K 4.4 3.9  CL 103 101  CO2 24 23  GLUCOSE 327* 222*  BUN 16 15  CREATININE 1.20 1.05  CALCIUM 8.4* 8.3*    CMET: Lab Results  Component Value Date   WBC 11.4 (H)  05/02/2019   HGB 10.4 (L) 05/02/2019   HCT 33.2 (L) 05/02/2019   PLT 163 05/02/2019   GLUCOSE 222 (H) 05/02/2019   CHOL 93 04/28/2019   TRIG 167 (H) 04/28/2019   HDL 37 (L) 04/28/2019   LDLCALC 23 04/28/2019   ALT 33 04/28/2019   AST 27 04/28/2019   NA 135 05/02/2019   K 3.9 05/02/2019   CL 101 05/02/2019   CREATININE 1.05 05/02/2019   BUN 15 05/02/2019   CO2 23 05/02/2019   INR 1.3 (H) 04/30/2019   HGBA1C 7.2 (H) 04/28/2019      PT/INR:  Recent Labs    04/30/19 1334  LABPROT 15.8*  INR 1.3*   Radiology: No results found.   Assessment/Plan: S/P Procedure(s) (LRB): CORONARY ARTERY BYPASS GRAFTING (CABG) TIMES THREE USING LEFT INTERNAL MAMMARY AND RIGHT GREATER SAPHENOUS VEIN HARVESTED ENDOSCOPICALLY. MAMMARY ARTERY TO LAD, SAPHENOUS VEIN GRAFT TO  OM1, SAPHENOUS VEING GRAFT TO RIGHT. BIOPSY OF MEDIASTINAL TISSUE PLACEMENT OF RIGHT FEMORAL A-LINE (N/A) TRANSESOPHAGEAL ECHOCARDIOGRAM (TEE) (N/A) Mobilize Diuresis Diabetes control Plan for transfer to step-down: see transfer orders DC Foley Renal function stable   Grace Isaac 05/02/2019 7:55 AM

## 2019-05-02 NOTE — Progress Notes (Addendum)
Inpatient Diabetes Program Recommendations  AACE/ADA: New Consensus Statement on Inpatient Glycemic Control (2015)  Target Ranges:  Prepandial:   less than 140 mg/dL      Peak postprandial:   less than 180 mg/dL (1-2 hours)      Critically ill patients:  140 - 180 mg/dL   Lab Results  Component Value Date   GLUCAP 294 (H) 05/02/2019   HGBA1C 7.2 (H) 04/28/2019    Review of Glycemic Control Results for Cristian Martin, Cristian Martin (MRN [id number]) as of 05/02/2019 10:51  Ref. Range 05/01/2019 23:58 05/02/2019 05:21 05/02/2019 08:11  Glucose-Capillary Latest Ref Range: 70 - 99 mg/dL 223 (H) 218 (H) 294 (H)   Diabetes history: Type 2 DM Outpatient Diabetes medications: Metformin 1000 mg BID, Novolog 70/30 60 units BID, Ozempic 1 mg Qwk Current orders for Inpatient glycemic control: Novolog 0-24 units TID &HS, Metformin 1000 mg QAM, Novolog 70/30 60 units BID   Inpatient Diabetes Program Recommendations:    Noted consult and new orders. Patient did not received 70/30 yesterday, thus explaining increased glucose trends. Will follow today's trends for further recommendations.   Addendum: Spoke with patient regarding outpatient diabetes management. Patient is followed by Dr Gabriel Carina, outpatient endocrinology. Patient reports checking blood sugars 2 times per day and that they range 90-180's. Is scheduled for a follow up with Dr Gabriel Carina after hospitalization.  Reviewed patient's current A1c of 7.2%, down from 9%. Explained what a A1c is and what it measures. Also reviewed goal A1c with patient, importance of good glucose control @ home, and blood sugar goals. Reviewed patho of DM, importance of maintaining control in goal, impact of glucose control on healing, vascular changes and commorbidities.  Patient has meter and testing supplies.  States, "This has been quite the wake up call, now I just got to increase my activity as I can and eat what I know I should be." Encouragement provided. All questions answered.     Thanks, Bronson Curb, MSN, RNC-OB Diabetes Coordinator [phone] (8a-5p)

## 2019-05-02 NOTE — Progress Notes (Signed)
Patient arrived from [facility].  CHG bath. Vitals signs, CCMD notified, oriented to room.

## 2019-05-03 ENCOUNTER — Inpatient Hospital Stay (HOSPITAL_COMMUNITY): Payer: No Typology Code available for payment source

## 2019-05-03 LAB — CBC
HCT: 32.6 % — ABNORMAL LOW (ref 39.0–52.0)
Hemoglobin: 10.3 g/dL — ABNORMAL LOW (ref 13.0–17.0)
MCH: 27.2 pg (ref 26.0–34.0)
MCHC: 31.6 g/dL (ref 30.0–36.0)
MCV: 86 fL (ref 80.0–100.0)
Platelets: 169 10*3/uL (ref 150–400)
RBC: 3.79 MIL/uL — ABNORMAL LOW (ref 4.22–5.81)
RDW: 14.9 % (ref 11.5–15.5)
WBC: 10 10*3/uL (ref 4.0–10.5)
nRBC: 0 % (ref 0.0–0.2)

## 2019-05-03 LAB — BASIC METABOLIC PANEL
Anion gap: 10 (ref 5–15)
BUN: 18 mg/dL (ref 8–23)
CO2: 26 mmol/L (ref 22–32)
Calcium: 8.5 mg/dL — ABNORMAL LOW (ref 8.9–10.3)
Chloride: 102 mmol/L (ref 98–111)
Creatinine, Ser: 1.05 mg/dL (ref 0.61–1.24)
GFR calc Af Amer: >60 mL/min (ref >60)
GFR calc non Af Amer: >60 mL/min (ref >60)
Glucose, Bld: 148 mg/dL — ABNORMAL HIGH (ref 70–99)
Potassium: 4.2 mmol/L (ref 3.5–5.1)
Sodium: 138 mmol/L (ref 135–145)

## 2019-05-03 LAB — GLUCOSE, CAPILLARY
Glucose-Capillary: 151 mg/dL — ABNORMAL HIGH (ref 70–99)
Glucose-Capillary: 207 mg/dL — ABNORMAL HIGH (ref 70–99)
Glucose-Capillary: 165 mg/dL — ABNORMAL HIGH (ref 70–99)
Glucose-Capillary: 182 mg/dL — ABNORMAL HIGH (ref 70–99)

## 2019-05-03 MED ORDER — INSULIN GLARGINE 100 UNIT/ML ~~LOC~~ SOLN
10.0000 [IU] | Freq: Every day | SUBCUTANEOUS | Status: DC
  Administered 2019-05-03: 10 [IU] via SUBCUTANEOUS
  Filled 2019-05-03 (×2): qty 0.1

## 2019-05-03 MED ORDER — ACETAMINOPHEN 325 MG PO TABS: 650.0000 mg | ORAL_TABLET | Freq: Four times a day (QID) | ORAL | Status: DC | PRN

## 2019-05-03 MED ORDER — ASPIRIN 325 MG PO TBEC: 325.0000 mg | DELAYED_RELEASE_TABLET | Freq: Every day | ORAL | 0 refills | Status: DC

## 2019-05-03 MED ORDER — GUAIFENESIN ER 600 MG PO TB12: 600.0000 mg | ORAL_TABLET | Freq: Two times a day (BID) | ORAL | Status: DC | PRN

## 2019-05-03 NOTE — Progress Notes (Signed)
Epicardial pacing wires discontinued per order at 10:39 am w/o difficulty.  Wires intact upon removal.  Sutures removed.  Exit sites intact & covered with Band-Aid.   Patient tolerated well.  Denies SOB, pain or discomfort.   Vital signs initiated Q15 mins & bedrest x 1 hour.

## 2019-05-03 NOTE — Progress Notes (Addendum)
      Cristian Martin [address]       Cristian Martin,Cristian Martin [id number]             [phone]        3 Days Post-Op Procedure(s) (LRB): CORONARY ARTERY BYPASS GRAFTING (CABG) TIMES THREE USING LEFT INTERNAL MAMMARY AND RIGHT GREATER SAPHENOUS VEIN HARVESTED ENDOSCOPICALLY. MAMMARY ARTERY TO LAD, SAPHENOUS VEIN GRAFT TO  OM1, SAPHENOUS VEING GRAFT TO RIGHT. BIOPSY OF MEDIASTINAL TISSUE PLACEMENT OF RIGHT FEMORAL A-LINE (N/A) TRANSESOPHAGEAL ECHOCARDIOGRAM (TEE) (N/A)  Subjective: Patient slept well last night. He has already walked once this am. He has no specific complaint this am.  Objective: Vital signs in last 24 hours: Temp:  [97.7 F (36.5 C)-99.3 F (37.4 C)] 97.9 F (36.6 C) (04/01 0530) Pulse Rate:  [79-94] 79 (04/01 0530) Cardiac Rhythm: Normal sinus rhythm (04/01 0530) Resp:  [18-26] 21 (04/01 0530) BP: (104-152)/(65-88) 104/74 (04/01 0530) SpO2:  [90 %-95 %] 90 % (04/01 0530) Weight:  [93.7 kg] 93.7 kg (04/01 0500)  Pre op weight 90.8 kg Current Weight  05/03/19 93.7 kg       Intake/Output from previous day: 03/31 0701 - 04/01 0700 In: 723 [P.O.:720; I.V.:3] Out: 500 [Urine:500]   Physical Exam:  Cardiovascular: RRR Pulmonary: Slightly diminished bibasilar breath sounds Abdomen: Soft, non tender, bowel sounds present. Extremities: Mild bilateral lower extremity edema. Wounds: Aquacel removed and wound is lean and dry.  No erythema or signs of infection. RLE wounds clean, dry.  Lab Results: CBC: Recent Labs    05/02/19 0524 05/03/19 0304  WBC 11.4* 10.0  HGB 10.4* 10.3*  HCT 33.2* 32.6*  PLT 163 169   BMET:  Recent Labs    05/02/19 0524 05/03/19 0304  NA 135 138  K 3.9 4.2  CL 101 102  CO2 23 26  GLUCOSE 222* 148*  BUN 15 18  CREATININE 1.05 1.05  CALCIUM 8.3* 8.5*    PT/INR:  Lab Results  Component Value Date   INR 1.3 (H) 04/30/2019   INR 1.0 04/28/2019   INR 1.0 07/11/2018   ABG:  INR: Will add last result for INR, ABG once  components are confirmed Will add last 4 CBG results once components are confirmed  Assessment/Plan:  1. CV-SR with HR in the 80's this am. . On Cored 3.125 mg bid,  Clonidine 0.3 mg tid, Doxazsin 2 mg at hs, and Amlodipine 10 mg daily. 2. Pulmonary-on 2 liters of oxygen via [place]. Wean as able over next few days. Chest tubes with 400 cc since surgery. CXR this am appears stable (no pneumothorax, small pleural effusions, prominent superior mediastinum slightly less and has been present post op).  Encourage incentive spirometer. 3. Volume overload-will give Lasix 20 mg daily 4. Expected post op blood loss anemia-H and H this am 10.3 and 32.6. 5. DM-CBGs 181/212/151. On  Novolog mix 70/30 60 units bid, Metformin XR 1000 mg every am and at bedtime. Also, on Semaglutide 1 mg every Sunday, which will restart at discharge. Pre op HGA1C 7.2. 6. Remove EPW 7. Ambulate at least tid 8. Likely home in 1-2 days  Cristian Martin 05/03/2019,7:03 AM  Steady progress Poss home 1-2 days I have seen and examined Cristian Martin and agree with the above assessment  and plan.  Cristian Isaac MD Beeper [phone] Office [phone] 05/03/2019 9:13 AM

## 2019-05-03 NOTE — Progress Notes (Signed)
Oxygen at 2L removed Room air 92%

## 2019-05-03 NOTE — Progress Notes (Signed)
CARDIAC REHAB PHASE I   PRE:  Rate/Rhythm: 91 SR  BP:  Supine: 114/69  Sitting:   Standing:    SaO2: 91%RA  MODE:  Ambulation: 370 ft   POST:  Rate/Rhythm: 98 SR  BP:  Supine:   Sitting: 132/97  Standing:    SaO2: 91%RA hall and 94%RA room [id number] Pt walked 370 ft on RA with rolling walker with steady gait. Needed a little assistance to get from lying in bed to sitting on side. Rocked to stand . Stopped once to rest. Checked sats and good on RA. To recliner with call bell. Tolerated well. Going to try to increase distance with third walk today. Has walker available at home if needed.   Graylon Good, RN BSN  05/03/2019 12:54 PM

## 2019-05-03 NOTE — Progress Notes (Signed)
Inpatient Diabetes Program Recommendations  AACE/ADA: New Consensus Statement on Inpatient Glycemic Control   Target Ranges:  Prepandial:   less than 140 mg/dL      Peak postprandial:   less than 180 mg/dL (1-2 hours)      Critically ill patients:  140 - 180 mg/dL   Results for VERBLE, STODDART (MRN [id number]) as of 05/03/2019 13:29  Ref. Range 05/02/2019 08:11 05/02/2019 11:58 05/02/2019 16:16 05/02/2019 21:22 05/03/2019 06:13 05/03/2019 11:42  Glucose-Capillary Latest Ref Range: 70 - 99 mg/dL 294 (H)  Novolog 12 units  70/30 60 units 202 (H)  Novolog 8 units 181 (H)  Novolog 4 units 212 (H)  Novolog 8 units 151 (H)  Novolog 2 units 207 (H)   Review of Glycemic Control  Diabetes history: DM2 Outpatient Diabetes medications: Metformin 1000 mg BID, Novolog 70/30 60 units BID, Ozempic 1 mg Qwk Current orders for Inpatient glycemic control: 70/30 60 units BID, Novolog 0-24 units AC&HS, Metformin XR 1000 mg QAM  Inpatient Diabetes Program Recommendations:   Insulin - Basal: Per chart, patient did not receive 70/30 yesterday evening with supper and patient refused 70/30 dose this morning since he is not eating as well as he does at home (per RN). Please consider discontinuing 70/30 and ordering Levemir 10 units Q24H.  Thanks, Barnie Alderman, RN, MSN, CDE Diabetes Coordinator Inpatient Diabetes Program [phone] (Team Pager from 8am to 5pm)

## 2019-05-03 NOTE — Progress Notes (Addendum)
MOBILITY TEAM - Progress Note   05/03/19 1358  Mobility  Activity Ambulated in hall  Level of Assistance Modified independent, requires aide device or extra time  Assistive Device Front wheel walker  Distance Ambulated (ft) 500 ft  Mobility Response Tolerated well  Mobility performed by Mobility specialist  Bed Position Chair   Pre-activity: HR 88, SpO2 93% on RA During activity: HR 90, SpO2 90-91% on RA Post-activity: HR 90-94, BP 129/117 (123), SpO2 91-95% on RA  Patient motivated to participate, even willing to let this mobility specialist interrupt his lunch to go for a walk. Good awareness of sternal precautions. Not interested in attempting ambulation without DME.  Mabeline Caras, PT, DPT Mobility Team Pager [phone]

## 2019-05-04 LAB — GLUCOSE, CAPILLARY
Glucose-Capillary: 165 mg/dL — ABNORMAL HIGH (ref 70–99)
Glucose-Capillary: 213 mg/dL — ABNORMAL HIGH (ref 70–99)

## 2019-05-04 MED ORDER — TRAMADOL HCL 50 MG PO TABS: 50.0000 mg | ORAL_TABLET | Freq: Four times a day (QID) | ORAL | 0 refills | Status: DC | PRN

## 2019-05-04 MED ORDER — FUROSEMIDE 20 MG PO TABS: 20.0000 mg | ORAL_TABLET | Freq: Every day | ORAL | 0 refills | Status: DC

## 2019-05-04 MED ORDER — POTASSIUM CHLORIDE CRYS ER 10 MEQ PO TBCR
10.0000 meq | EXTENDED_RELEASE_TABLET | Freq: Every day | ORAL | Status: DC
  Administered 2019-05-04: 10 meq via ORAL
  Filled 2019-05-04: qty 1

## 2019-05-04 MED ORDER — CLONIDINE HCL 0.3 MG PO TABS: 0.3000 mg | ORAL_TABLET | Freq: Two times a day (BID) | ORAL | Status: DC

## 2019-05-04 MED ORDER — ROSUVASTATIN CALCIUM 40 MG PO TABS: 40.0000 mg | ORAL_TABLET | Freq: Every day | ORAL | 1 refills | Status: AC

## 2019-05-04 MED ORDER — LOSARTAN POTASSIUM 50 MG PO TABS: 50.0000 mg | ORAL_TABLET | Freq: Every day | ORAL | 1 refills | Status: DC

## 2019-05-04 MED ORDER — NOVOLOG MIX 70/30 FLEXPEN (70-30) 100 UNIT/ML ~~LOC~~ SUPN: 20.0000 [IU] | PEN_INJECTOR | Freq: Two times a day (BID) | SUBCUTANEOUS | 11 refills | Status: DC

## 2019-05-04 MED ORDER — POTASSIUM CHLORIDE CRYS ER 10 MEQ PO TBCR: 10.0000 meq | EXTENDED_RELEASE_TABLET | Freq: Every day | ORAL | 0 refills | Status: DC

## 2019-05-04 NOTE — Progress Notes (Signed)
CARDIAC REHAB PHASE I   PRE:  Rate/Rhythm: 85 SR  BP:  Supine:   Sitting: 125/77  Standing:    SaO2: 91%RA  MODE:  Ambulation: 470 ft   POST:  Rate/Rhythm: 109 ST  BP:  Supine:   Sitting: 157/72  Standing:    SaO2: 92%RA [id number] Pt walked 470 ft on RA with hand held asst with steady gait. Did not need to rest. To recliner and set up breakfast after walk. Pt has talked with his wife and she will be here at 12 noon for ed. His son is taking a long lunch break and bringing mother. Notified RN of time and discussed with pt to have RN page me if his wife gets here earlier. Will return at 104.   Graylon Good, RN BSN  05/04/2019 8:31 AM

## 2019-05-04 NOTE — Progress Notes (Signed)
0141-0301 Family met by Cardiac Rehab upon entering room. Education completed with pt, wife and son. Understanding voiced. Encouraged staying in the tube, sternal precautions, IS, walking for ex, heart healthy and low carb foods, and CRP 2. Referral sent to [facility] CRP 2. Graylon Good RN BSN 05/04/2019 12:32 PM

## 2019-05-04 NOTE — Progress Notes (Addendum)
[address]       [place] [address]             [phone]        4 Days Post-Op Procedure(s) (LRB): CORONARY ARTERY BYPASS GRAFTING (CABG) TIMES THREE USING LEFT INTERNAL MAMMARY AND RIGHT GREATER SAPHENOUS VEIN HARVESTED ENDOSCOPICALLY. MAMMARY ARTERY TO LAD, SAPHENOUS VEIN GRAFT TO  OM1, SAPHENOUS VEING GRAFT TO RIGHT. BIOPSY OF MEDIASTINAL TISSUE PLACEMENT OF RIGHT FEMORAL A-LINE (N/A) TRANSESOPHAGEAL ECHOCARDIOGRAM (TEE) (N/A)  Subjective: Patient did not sleep as well last night as he did the night before. He had a few bowel movements. He is able to void on his own without problems. He hopes to go home.  Objective: Vital signs in last 24 hours: Temp:  [97.7 F (36.5 C)-98.8 F (37.1 C)] 98 F (36.7 C) (04/02 0337) Pulse Rate:  [78-84] 79 (04/02 0337) Cardiac Rhythm: Normal sinus rhythm (04/01 1900) Resp:  [17-23] 18 (04/02 0337) BP: (98-130)/(51-72) 117/67 (04/02 0337) SpO2:  [89 %-96 %] 90 % (04/02 0337) Weight:  [93.6 kg] 93.6 kg (04/02 0337)  Pre op weight 90.8 kg Current Weight  05/04/19 93.6 kg       Intake/Output from previous day: 04/01 0701 - 04/02 0700 In: 240 [P.O.:240] Out: -    Physical Exam:  Cardiovascular: RRR Pulmonary: Slightly diminished bibasilar breath sounds Abdomen: Soft, non tender, bowel sounds present. Extremities: Mild bilateral lower extremity edema. Wounds: Aquacel removed and wound is lean and dry.  No erythema or signs of infection. RLE wounds clean, dry.  Lab Results: CBC: Recent Labs    05/02/19 0524 05/03/19 0304  WBC 11.4* 10.0  HGB 10.4* 10.3*  HCT 33.2* 32.6*  PLT 163 169   BMET:  Recent Labs    05/02/19 0524 05/03/19 0304  NA 135 138  K 3.9 4.2  CL 101 102  CO2 23 26  GLUCOSE 222* 148*  BUN 15 18  CREATININE 1.05 1.05  CALCIUM 8.3* 8.5*    PT/INR:  Lab Results  Component Value Date   INR 1.3 (H) 04/30/2019   INR 1.0 04/28/2019   INR 1.0 07/11/2018   ABG:  INR: Will  add last result for INR, ABG once components are confirmed Will add last 4 CBG results once components are confirmed  Assessment/Plan:  1. CV-SR with HR in the 80's this am. . BP well controlled on Coreg 3.125 mg bid,  Clonidine 0.3 mg tid, Doxazosin 2 mg at hs, and Amlodipine 10 mg daily. He was on Losartan 100 mg daily prior to surgery. As discussed with Dr. Servando Snare will restart Losartan at lower dose and decrease Clonidine to 0.3 mg bid. 2. Pulmonary-on room air.Encourage incentive spirometer. 3. Volume overload-will give Lasix 20 mg daily 4. Expected post op blood loss anemia-H and H this am 10.3 and 32.6. 5. DM-CBGs 165/182/165. Previously on Novolog mix 70/30 60 units bid. Per diabetes educator, stopped because he is not eating much and on Insulin Glargine 10 units at daily. Patient and I discussed restarting Novolog 70/30 at a reduced dose until eats better. He is also on Metformin XR 1000 mg every am and at bedtime. Also,he was on Semaglutide 1 mg every Sunday prior to surgery, which will restart at discharge. Pre op HGA1C 7.2. 6. Will discharge today  Sharalyn [facility] 05/04/2019,7:16 AM  Patient feels well this am, wants to go home  Cardiology was trying to get off clonidine , resume ARB and titrate clonidine  Will need close follow up for DM and hypertension  I have seen and examined Devonne Doughty and agree with the above assessment  and plan.  Grace Isaac MD Beeper [phone] Office [phone] 05/04/2019 7:49 AM

## 2019-05-04 NOTE — Plan of Care (Signed)
DISCHARGE NOTE HOME [facility] to be discharged home per MD order. Discussed prescriptions and follow up appointments with the patient. Medication list explained in detail. Patient verbalized understanding.  Skin clean, dry and intact without evidence of skin break down, no evidence of skin tears noted. IV catheter discontinued intact. Site without signs and symptoms of complications. Dressing and pressure applied. No complaints noted.  Patient free of lines, drains, and wounds.   An After Visit Summary (AVS) was printed and given to the patient. Patient escorted via wheelchair, and discharged home via private auto.  Stephan Minister, RN

## 2019-05-04 NOTE — Op Note (Signed)
NAME: Cristian Martin, Cristian Martin. MEDICAL RECORD [id number] ACCOUNT [id number] DATE OF BIRTH:Mar 22, 1945 FACILITY: [facility] LOCATION: [facility] PHYSICIAN:Reverie Vaquera Maryruth Bun, MD  OPERATIVE REPORT  DATE OF PROCEDURE:  04/30/2019  PREOPERATIVE DIAGNOSIS:  Unstable angina with severe 3-vessel coronary artery disease.  POSTOPERATIVE DIAGNOSES:  Unstable angina with severe 3-vessel coronary artery disease.  SURGICAL PROCEDURE:   1.  Coronary artery bypass grafting x3 with the left internal mammary to the left anterior descending coronary artery, reverse saphenous vein graft to the obtuse marginal coronary artery, reverse saphenous vein graft to the right coronary artery with  right greater saphenous thigh and calf endoscopic vein harvesting. 2.  Biopsy of mediastinal lymph node.  SURGEON:  Lanelle Bal, MD  FIRST ASSISTANT:  Melodie Bouillon, MD   SECOND ASSISTANT:   Lars Pinks, PA.  BRIEF HISTORY:  The patient is a 74 year old male with longstanding diabetes who presented with a month-long history of increasing episodes of substernal chest discomfort with very minimal activity.  He notes that he could barely get to his mailbox  without having symptoms of substernal chest pain.  He was seen at the [place].  A stress test was performed.  The patient noted he could barely go for 1-1/2 minutes.  He was ultimately referred to Dr. Fletcher Anon, who admitted the patient on the 26th for cardiac  catheterization.  Previously, the patient was treated for prostate surgery 6 months before.  At that time, a chest x-ray suggested fullness of the mediastinum.  Subsequent CT showed significant mediastinal fat, but no specific mass.  The patient has  normal renal function, but does have longstanding hard to control diabetes.  Because of the patient's coronary occlusive disease noted on catheterization, particularly the very high-grade ostial LAD lesion of 95% that extends into the distal left main  and proximal circumflex  vessel.  The patient has a nondominant small right coronary artery with 70% proximal lesion.  Overall, ventricular function is preserved.  Risks and options were discussed with the patient and his wife in detail and he was  agreeable to proceeding.  DESCRIPTION OF PROCEDURE:  With Swan-Ganz and arterial line monitors in place, the patient underwent general endotracheal anesthesia without incident.  The skin of the chest and legs was prepped with Betadine, draped in the usual sterile manner.  An  appropriate timeout was performed.  We then proceeded with endoscopic vein harvesting of the right greater saphenous vein from the thigh and upper calf.  The vein was of excellent quality.  Median sternotomy was performed.  The left internal mammary  artery was dissected down as a pedicle graft.  The distal artery was divided and had good free flow.  The pericardium was opened.  As noted, the patient did have extensive mediastinal fat overlying the pericardium.  Toward the left side as we were  dissecting down on the mammary, there was some fullness palpated and an area of mediastinal fat was excised.  This was sent for frozen section and was returned as benign lymph node.  We dissected down to the left internal mammary artery as a pedicle  graft.  The distal artery was divided and had excellent free flow.  The vessel was hydrostatically dilated with heparinized saline.  The pericardium was opened.  The patient was systemically heparinized.  The ascending aorta was cannulated.  The right  atrium was cannulated.  An aortic root vent cardioplegia needle was introduced into the ascending aorta.  The patient was placed on cardiopulmonary bypass, 2.4 L per minute per  meter square.  Sites of anastomosis were selected and dissected at the  epicardium.  The patient's body temperature was cooled to 32 degrees.  Aortic crossclamp was applied and 500 mL of cold blood potassium cardioplegia was administered with diastolic  arrest of the heart.  Myocardial septal temperature was monitored  throughout the crossclamp.  We turned our attention first to the large obtuse marginal vessel, which was partially intramyocardial.  This vessel was opened and admitted a 1.5 mm probe easily proximally and distally.  Using a running 7-0 Prolene, distal  anastomosis was performed.  Additional cold blood cardioplegia was administered down this vein graft.  Attention was then turned to the right system.  The right coronary artery was nondominant, but was moderate size right ventricular branches.  This was  opened and admitted 1.5 mm probe distally.  Using a running 7-0 Prolene, distal anastomosis was performed with a segment of reverse saphenous vein graft.  We then turned our attention to the mid left anterior descending coronary artery.  This vessel was  opened and admitted 1.5 mm probe proximally and distally.  Using a running 8-0 Prolene, the left internal mammary artery was anastomosed to left anterior descending coronary artery.  The fascia was tacked to the epicardium.  With the crossclamp still in  place, 2 punch aortotomies were performed and each of the 2 vein grafts were anastomosed to the ascending aorta.  The bulldog was then removed from the mammary artery with prompt rise in myocardial septal temperature.  The heart was allowed to passively  fill and deair and the aortic crossclamp was removed and the proximal anastomosis completed with a total crossclamp time of 70 minutes.  Sites of anastomoses were inspected for bleeding.  There was 1 small side branch on the mammary artery that was  bleeding.  This was clipped.  With the patient's body temperature rewarmed to 37 degrees, he was then ventilated and weaned from cardiopulmonary bypass  Initially, the patient remained hemodynamically stable.  After a portion of the protamine was given,  he did have significant hypotension transiently requiring Neo-Synephrine.  This gradually  resolved.  He was decannulated in the usual fashion and remained hemodynamically stable, ultimately leaving the room without pressors.  He was decannulated in the  usual fashion.  The remainder of the protamine was administered.  Atrial and ventricular pacing wires were applied.  Graft markers were applied.  A left pleural tube and Blake mediastinal drain were left in place.  The sternum was closed with a  combination of sternal wires and bone sutures.  The fascia was closed with interrupted 0 Vicryl, running 3-0 Vicryl in subcutaneous tissue and 4-0 subcuticular stitch in skin edges.  Dry dressings were applied.  Sponge and needle count was reported as  correct at completion of procedure.  The patient tolerated the procedure without obvious complication.  Total pump time was 102 minutes.  He did not require any blood bank blood products during the operative procedure.  VN/NUANCE  D:05/04/2019 T:05/04/2019 JOB:[id number]

## 2019-05-23 ENCOUNTER — Other Ambulatory Visit: Payer: Self-pay

## 2019-05-23 ENCOUNTER — Encounter: Payer: Self-pay | Admitting: Nurse Practitioner

## 2019-05-23 ENCOUNTER — Ambulatory Visit (INDEPENDENT_AMBULATORY_CARE_PROVIDER_SITE_OTHER): Payer: No Typology Code available for payment source | Admitting: Nurse Practitioner

## 2019-05-23 VITALS — BP 140/70 | HR 96 | Ht 71.0 in | Wt 212.4 lb

## 2019-05-23 DIAGNOSIS — E119 Type 2 diabetes mellitus without complications: Secondary | ICD-10-CM

## 2019-05-23 DIAGNOSIS — I2510 Atherosclerotic heart disease of native coronary artery without angina pectoris: Secondary | ICD-10-CM | POA: Diagnosis not present

## 2019-05-23 DIAGNOSIS — E785 Hyperlipidemia, unspecified: Secondary | ICD-10-CM

## 2019-05-23 DIAGNOSIS — I10 Essential (primary) hypertension: Secondary | ICD-10-CM

## 2019-05-23 DIAGNOSIS — Z794 Long term (current) use of insulin: Secondary | ICD-10-CM

## 2019-05-23 NOTE — Patient Instructions (Signed)
Medication Instructions:  Your physician recommends that you continue on your current medications as directed. Please refer to the Current Medication list given to you today.  *If you need a refill on your cardiac medications before your next appointment, please call your pharmacy*   Lab Work: Your physician recommends that you have lab work today(CBC, BMET)  If you have labs (blood work) drawn today and your tests are completely normal, you will receive your results only by: Marland Kitchen MyChart Message (if you have MyChart) OR . A paper copy in the mail If you have any lab test that is abnormal or we need to change your treatment, we will call you to review the results.   Testing/Procedures: None ordered    Follow-Up: At [facility], you and your health needs are our priority.  As part of our continuing mission to provide you with exceptional heart care, we have created designated Provider Care Teams.  These Care Teams include your primary Cardiologist (physician) and Advanced Practice Providers (APPs -  Physician Assistants and Nurse Practitioners) who all work together to provide you with the care you need, when you need it.  We recommend signing up for the patient portal called "MyChart".  Sign up information is provided on this After Visit Summary.  MyChart is used to connect with patients for Virtual Visits (Telemedicine).  Patients are able to view lab/test results, encounter notes, upcoming appointments, etc.  Non-urgent messages can be sent to your provider as well.   To learn more about what you can do with MyChart, go to [email or web address].    Your next appointment:   2 month(s)  The format for your next appointment:   In Person  Provider:    You may see [name], MD or Murray Hodgkins, NP.

## 2019-05-23 NOTE — Progress Notes (Signed)
Office Visit    Patient Name: Cristian Martin Date of Encounter: 05/23/2019  Primary Care Provider:  Patient, No Pcp Per Primary Cardiologist:  [name], MD  Chief Complaint    74 year old male with a history of hypertension, hyperlipidemia, diabetes, diabetic peripheral neuropathy, and prostate cancer, who presents for follow-up after recent coronary artery bypass grafting.  Past Medical History    Past Medical History:  Diagnosis Date   Agent orange exposure    Coronary artery disease    a. 04/2019 Cath: LM 99, LAD 80p, D2 70, LCX 50ost/p, RCA 70p, EF 55-65%; b. 04/2019 CABG x 3 (LIMA to LAD, SVG to OM1, SVG to RCA).   Diabetic peripheral neuropathy (HCC)    Diastolic dysfunction    a 04/2019 Echo: EF 55-60%, mod conc LVH, Gr1 DD. Nl RV fxn. Mild Ao sclerosis w/o stenosis.   GERD (gastroesophageal reflux disease)    Hyperlipidemia    Hyperplasia of prostate with lower urinary tract symptoms (LUTS)    Hypertension    Prostate cancer [facility]) urologist-- dr ottelin/  oncologist-- dr Tammi Klippel   dx 02-21-2018  -- Stage T1c, Gleason 3+4   Type 2 diabetes mellitus treated with insulin ([place])    followed by [facility] in [place]   Wears dentures    upper    Wears glasses    "wears to help double vision"   Past Surgical History:  Procedure Laterality Date   CORONARY ARTERY BYPASS GRAFT N/A 04/30/2019   Procedure: CORONARY ARTERY BYPASS GRAFTING (CABG) TIMES THREE USING LEFT INTERNAL MAMMARY AND RIGHT GREATER SAPHENOUS VEIN HARVESTED ENDOSCOPICALLY. MAMMARY ARTERY TO LAD, SAPHENOUS VEIN GRAFT TO  OM1, SAPHENOUS VEING GRAFT TO RIGHT. BIOPSY OF MEDIASTINAL TISSUE PLACEMENT OF RIGHT FEMORAL A-LINE;  Surgeon: Grace Isaac, MD;  Location: [place];  Service: Open Heart Surgery;  Laterality: N/A;   CYSTOSCOPY N/A 07/14/2018   Procedure: CYSTOSCOPY;  Surgeon: Kathie Rhodes, MD;  Location: [facility];  Service: Urology;  Laterality: N/A;  no seeds in bladder per  Dr Karsten Ro   LEFT HEART CATH AND CORONARY ANGIOGRAPHY N/A 04/27/2019   Procedure: LEFT HEART CATH AND CORONARY ANGIOGRAPHY;  Surgeon: Wellington Hampshire, MD;  Location: [place] [facility];  Service: Cardiovascular;  Laterality: N/A;   PROSTATE BIOPSY  02-21-2018  dr Karsten Ro in office   RADIOACTIVE SEED IMPLANT N/A 07/14/2018   Procedure: RADIOACTIVE SEED IMPLANT/BRACHYTHERAPY IMPLANT;  Surgeon: Kathie Rhodes, MD;  Location: [place];  Service: Urology;  Laterality: N/A;  77 seeds    SHOULDER ARTHROSCOPY Left 2018   in [place]   "remove spur"   SPACE OAR INSTILLATION N/A 07/14/2018   Procedure: SPACE OAR INSTILLATION;  Surgeon: Kathie Rhodes, MD;  Location[facility];  Service: Urology;  Laterality: N/A;   TEE WITHOUT CARDIOVERSION N/A 04/30/2019   Procedure: TRANSESOPHAGEAL ECHOCARDIOGRAM (TEE);  Surgeon: Grace Isaac, MD;  Location: [place];  Service: Open Heart Surgery;  Laterality: N/A;    Allergies  Allergies  Allergen Reactions   Ace Inhibitors Other (See Comments)    Hyperkalemia.    History of Present Illness    74 year old male with the above past medical history including hypertension, hyperlipidemia, diabetes, diabetic peripheral neuropathy, and prostate cancer.  In late March, he was was seen in cardiology clinic with complaints of unstable angina.  He subsequently underwent outpatient diagnostic cardiac catheterization which revealed severe multivessel disease including a 99% left main stenosis, 80% proximal LAD stenosis, and 70%  proximal RCA stenosis.  He was transferred from [facility] to [facility] for surgical evaluation and subsequently underwent CABG x3 on March 29.  Postoperative course was relatively uneventful, though he did require titration of antihypertensives.  He was discharged home on April 2.  Since discharge, he has been doing reasonably well.  Immediately post discharge, he did note right lower extremity  swelling which has since more or less resolved.  He has had some dyspnea on exertion but notes that it is steadily improving.  He is now walking 21 minutes 3 times a day without significant limitations.  He has only mild chest wall discomfort and has not required any analgesics recently.  He denies chest pressure, PND, orthopnea, dizziness, syncope, or early satiety.  To the best of his knowledge, his surgical wounds have been healing well and he has not any fevers, chills, erythema, or drainage.  He has heard from cardiac rehab at [facility] and is just awaiting clearance from CT surgery.  He has follow-up there in early May.  He does check his blood pressure daily and we reviewed measurements today.  He is trending between [id number] and [id number] systolic.  Weights have been steady at 210 pounds on his home scale.  Home Medications    Prior to Admission medications   Medication Sig Start Date End Date Taking? Authorizing Provider  acetaminophen (TYLENOL) 325 MG tablet Take 2 tablets (650 mg total) by mouth every 6 (six) hours as needed for mild pain. 05/03/19   Nani Skillern, PA-C  amLODipine (NORVASC) 10 MG tablet Take 10 mg by mouth daily.    [provider]  aspirin EC 325 MG EC tablet Take 1 tablet (325 mg total) by mouth daily. 05/04/19   Nani Skillern, PA-C  calcium carbonate (TUMS - DOSED IN MG ELEMENTAL CALCIUM) 500 MG chewable tablet Chew 2-3 tablets by mouth daily as needed for indigestion or heartburn.    [provider]  carvedilol (COREG) 6.25 MG tablet Take 3.125 mg by mouth 2 (two) times daily with a meal.    [provider]  cloNIDine (CATAPRES) 0.3 MG tablet Take 1 tablet (0.3 mg total) by mouth 2 (two) times daily. 05/04/19   Nani Skillern, PA-C  doxazosin (CARDURA) 4 MG tablet Take 2 mg by mouth at bedtime.     [provider]  DULoxetine (CYMBALTA) 60 MG capsule Take 60 mg by mouth daily.    [provider]  famotidine (PEPCID) 20  MG tablet Take 20 mg by mouth 2 (two) times daily.    [provider]  furosemide (LASIX) 20 MG tablet Take 1 tablet (20 mg total) by mouth daily. For one week then stop 05/04/19   Nani Skillern, PA-C  guaiFENesin (MUCINEX) 600 MG 12 hr tablet Take 1 tablet (600 mg total) by mouth 2 (two) times daily as needed for cough or to loosen phlegm. 05/03/19   Nani Skillern, PA-C  insulin aspart protamine - aspart (NOVOLOG MIX 70/30 FLEXPEN) (70-30) 100 UNIT/ML FlexPen Inject 0.2 mLs (20 Units total) into the skin 2 (two) times daily before a meal. As the patient and I discussed, start with a lower dose of Insulin as he has not been eating well. Titrate Insulin upward to dose taken prior to surgery (60 units bid) as tolerates. 05/04/19   Nani Skillern, PA-C  losartan (COZAAR) 50 MG tablet Take 1 tablet (50 mg total) by mouth daily. 05/04/19   Nani Skillern, PA-C  metFORMIN (GLUCOPHAGE-XR) 500 MG 24 hr tablet Take 1,000 mg by mouth in the morning and at bedtime.    [provider]  Multiple Vitamins-Minerals (MULTIVITAMIN ADULT PO) Take 1 tablet by mouth daily.     [provider]  omeprazole (PRILOSEC) 20 MG capsule Take 20 mg by mouth 2 (two) times daily.     [provider]  potassium chloride (KLOR-CON) 10 MEQ tablet Take 1 tablet (10 mEq total) by mouth daily. For one week then stop. 05/04/19   Nani Skillern, PA-C  rosuvastatin (CRESTOR) 40 MG tablet Take 1 tablet (40 mg total) by mouth daily. 05/04/19   Lars Pinks M, PA-C  Semaglutide (OZEMPIC, 1 MG/DOSE, [place]) Inject 1 mg into the skin every Sunday.     [provider]  traMADol (ULTRAM) 50 MG tablet Take 1 tablet (50 mg total) by mouth every 6 (six) hours as needed for moderate pain. 05/04/19   Nani Skillern, PA-C  Trospium Chloride 60 MG CP24 Take 60 mg by mouth daily.    [provider]    Review of Systems    Mild right lower extremity swelling which has  improved since hospital discharge.  Still with some dyspnea on exertion but activity tolerance is steadily increasing.  Mild chest wall discomfort without the need for analgesics.  He denies angina, PND, orthopnea, palpitations, dizziness, syncope, or early satiety.  All other systems reviewed and are otherwise negative except as noted above.  Physical Exam    VS:  BP 140/70 (BP Location: Left Arm, Patient Position: Sitting, Cuff Size: Normal)    Pulse 96    Ht 5\' 11"  (1.803 m)    Wt 212 lb 6 oz (96.3 kg)    SpO2 98%    BMI 29.62 kg/m  , BMI Body mass index is 29.62 kg/m. GEN: Well nourished, well developed, in no acute distress. HEENT: normal. Neck: Supple, no JVD, carotid bruits, or masses. Cardiac: RRR, no murmurs, rubs, or gallops. No clubbing, cyanosis.  1+ right ankle edema.  Radials/DP/PT 2+ and equal bilaterally.  Sternal incision and right medial thigh/right lower leg incisions are healing well without erythema or discharge. Respiratory:  Respirations regular and unlabored, clear to auscultation bilaterally. GI: Soft, nontender, nondistended, BS + x 4.  Upper abdominal incision is healing well without erythema or discharge. MS: no deformity or atrophy. Skin: warm and dry, no rash. Neuro:  Strength and sensation are intact. Psych: Normal affect.  Accessory Clinical Findings    ECG personally reviewed by me today -regular sinus rhythm, 96, baseline artifact, nonspecific T changes - no acute changes.  Lab Results  Component Value Date   WBC 10.0 05/03/2019   HGB 10.3 (L) 05/03/2019   HCT 32.6 (L) 05/03/2019   MCV 86.0 05/03/2019   PLT 169 05/03/2019   Lab Results  Component Value Date   CREATININE 1.05 05/03/2019   BUN 18 05/03/2019   NA 138 05/03/2019   K 4.2 05/03/2019   CL 102 05/03/2019   CO2 26 05/03/2019   Lab Results  Component Value Date   ALT 33 04/28/2019   AST 27 04/28/2019   ALKPHOS 61 04/28/2019   BILITOT 0.8 04/28/2019   Lab Results  Component Value  Date   CHOL 93 04/28/2019   HDL 37 (L) 04/28/2019   LDLCALC 23 04/28/2019   TRIG 167 (H) 04/28/2019   CHOLHDL 2.5 04/28/2019    Lab Results  Component Value Date   HGBA1C 7.2 (H) 04/28/2019  Assessment & Plan    1.  Coronary artery disease: Status post recent diagnostic catheterization revealing severe multivessel coronary artery disease.  He is now status post coronary artery bypass grafting x3.  Postoperative course was relatively uneventful and he has been recovering well at home.  He has not had any angina and has only minimal chest wall discomfort.  He has not recently required any analgesics.  Though he continues to note some dyspnea on exertion, his exercise tolerance is improved compared to preop status and he is now walking 21 minutes 3 times a day.  He is hopeful to begin walking outside once he gets to 30 minutes-sometime in the next 10 days.  He has been contacted by cardiac rehabilitation and does plan to enroll once cleared.  His blood pressure has been stable at home in the [id number] systolic range.  Right lower extremity swelling improved significantly since immediately postop.  He remains on aspirin 325, beta-blocker, ARB, and high potency statin therapy.  He has follow-up with CT surgery in early May.  2.  Essential hypertension: Blood pressure mildly elevated today at 140/70.  We reviewed home blood pressure recordings and he has been running [id number] systolic.  In that setting, I have encouraged him to continue to follow his blood pressures and limit salt intake.  I will continue current doses of amlodipine, carvedilol, clonidine, and losartan.  3.  Hyperlipidemia: LDL of 23 on March 27.  LFTs normal at that time.  Continue high potency statin therapy.  4.  Type 2 diabetes mellitus: Patient is insulin-dependent but also uses oral agents.  A1c was 7.2 in March.  He is followed closely at the [facility].  5.  Mild anemia: Hemoglobin 10.3 hematocrit 32.6 postoperatively.   Follow-up CBC today to ensure stability.  6.  Disposition: Overall patient progressing well.  Follow-up CBC and basic metabolic panel today.  He has follow-up with CT surgery on May 6.  Follow-up with Dr. Fletcher Anon in 2 months or sooner if necessary.  Murray Hodgkins, NP 05/23/2019, 2:11 PM

## 2019-05-24 LAB — CBC
Hematocrit: 34.8 % — ABNORMAL LOW (ref 37.5–51.0)
Hemoglobin: 11 g/dL — ABNORMAL LOW (ref 13.0–17.7)
MCH: 26.4 pg — ABNORMAL LOW (ref 26.6–33.0)
MCHC: 31.6 g/dL (ref 31.5–35.7)
MCV: 84 fL (ref 79–97)
Platelets: 397 10*3/uL (ref 150–450)
RBC: 4.17 x10E6/uL (ref 4.14–5.80)
RDW: 14.3 % (ref 11.6–15.4)
WBC: 7.8 10*3/uL (ref 3.4–10.8)

## 2019-05-24 LAB — BASIC METABOLIC PANEL
BUN/Creatinine Ratio: 26 — ABNORMAL HIGH (ref 10–24)
BUN: 22 mg/dL (ref 8–27)
CO2: 21 mmol/L (ref 20–29)
Calcium: 9.2 mg/dL (ref 8.6–10.2)
Chloride: 101 mmol/L (ref 96–106)
Creatinine, Ser: 0.85 mg/dL (ref 0.76–1.27)
GFR calc Af Amer: 99 mL/min/{1.73_m2} (ref >59)
GFR calc non Af Amer: 86 mL/min/{1.73_m2} (ref >59)
Glucose: 99 mg/dL (ref 65–99)
Potassium: 4.9 mmol/L (ref 3.5–5.2)
Sodium: 140 mmol/L (ref 134–144)

## 2019-05-26 ENCOUNTER — Other Ambulatory Visit: Payer: Self-pay | Admitting: Physician Assistant

## 2019-06-06 ENCOUNTER — Other Ambulatory Visit: Payer: Self-pay | Admitting: Cardiothoracic Surgery

## 2019-06-06 DIAGNOSIS — Z951 Presence of aortocoronary bypass graft: Secondary | ICD-10-CM

## 2019-06-06 NOTE — Progress Notes (Signed)
[address]       ,[place] [address]             [id number]      Cristian Martin [place] Medical Record [id number] Date of Birth: [birth year]  Referring: Wellington Hampshire, MD [facility] Care: [facility] Cardiologist: [name], MD   Chief Complaint:   POST OP FOLLOW UP OPERATIVE REPORT DATE OF PROCEDURE:  04/30/2019 PREOPERATIVE DIAGNOSIS:  Unstable angina with severe 3-vessel coronary artery disease. POSTOPERATIVE DIAGNOSES:  Unstable angina with severe 3-vessel coronary artery disease. SURGICAL PROCEDURE:   1.  Coronary artery bypass grafting x3 with the left internal mammary to the left anterior descending coronary artery, reverse saphenous vein graft to the obtuse marginal coronary artery, reverse saphenous vein graft to the right coronary artery with  right greater saphenous thigh and calf endoscopic vein harvesting. 2.  Biopsy of mediastinal lymph node. Path  FINAL MICROSCOPIC DIAGNOSIS:   A. LYMPH NODE, MEDIASTINAL, EXCISION:  - Benign lymph node   History of Present Illness:     Patient making good progress following recent three-vessel coronary artery bypass graft done March 29.  The patient notes that he is making good progress postoperatively, ambulating around the house and in the neighborhood without difficulty.  He has avoided any heavy lifting.     Past Medical History:  Diagnosis Date   Agent orange exposure    Coronary artery disease    a. 04/2019 Cath: LM 99, LAD 80p, D2 70, LCX 50ost/p, RCA 70p, EF 55-65%; b. 04/2019 CABG x 3 (LIMA to LAD, SVG to OM1, SVG to RCA).   Diabetic peripheral neuropathy (HCC)    Diastolic dysfunction    a 04/2019 Echo: EF 55-60%, mod conc LVH, Gr1 DD. Nl RV fxn. Mild Ao sclerosis w/o stenosis.   GERD (gastroesophageal reflux disease)    Hyperlipidemia    Hyperplasia of prostate with lower urinary tract symptoms (LUTS)    Hypertension    Prostate cancer [facility]) urologist--  dr ottelin/  oncologist-- dr Tammi Klippel   dx 02-21-2018  -- Stage T1c, Gleason 3+4   Type 2 diabetes mellitus treated with insulin ([facility])    followed by [facility] in [place]   Wears dentures    upper    Wears glasses    "wears to help double vision"     Social History   Tobacco Use  Smoking Status Never Smoker  Smokeless Tobacco Never Used    Social History   Substance and Sexual Activity  Alcohol Use Not Currently     Allergies  Allergen Reactions   Ace Inhibitors Other (See Comments)    Hyperkalemia.    Current Outpatient Medications  Medication Sig Dispense Refill   acetaminophen (TYLENOL) 325 MG tablet Take 2 tablets (650 mg total) by mouth every 6 (six) hours as needed for mild pain.     amLODipine (NORVASC) 10 MG tablet Take 10 mg by mouth daily.     aspirin EC 325 MG EC tablet Take 1 tablet (325 mg total) by mouth daily. 30 tablet 0   calcium carbonate (TUMS - DOSED IN MG ELEMENTAL CALCIUM) 500 MG chewable tablet Chew 2-3 tablets by mouth daily as needed for indigestion or heartburn.     carvedilol (COREG) 6.25 MG tablet Take 3.125 mg by mouth 2 (two) times daily with a meal.     cloNIDine (CATAPRES) 0.3 MG tablet Take 1 tablet (0.3 mg total) by mouth 2 (two) times  daily.     doxazosin (CARDURA) 4 MG tablet Take 2 mg by mouth at bedtime.      DULoxetine (CYMBALTA) 60 MG capsule Take 60 mg by mouth daily.     famotidine (PEPCID) 20 MG tablet Take 20 mg by mouth 2 (two) times daily.     furosemide (LASIX) 20 MG tablet Take 1 tablet (20 mg total) by mouth daily. For one week then stop 7 tablet 0   guaiFENesin (MUCINEX) 600 MG 12 hr tablet Take 1 tablet (600 mg total) by mouth 2 (two) times daily as needed for cough or to loosen phlegm.     insulin aspart protamine - aspart (NOVOLOG MIX 70/30 FLEXPEN) (70-30) 100 UNIT/ML FlexPen Inject 0.2 mLs (20 Units total) into the skin 2 (two) times daily before a meal. As the patient and I discussed, start with a lower  dose of Insulin as he has not been eating well. Titrate Insulin upward to dose taken prior to surgery (60 units bid) as tolerates. 15 mL 11   losartan (COZAAR) 50 MG tablet Take 1 tablet (50 mg total) by mouth daily. 30 tablet 1   metFORMIN (GLUCOPHAGE-XR) 500 MG 24 hr tablet Take 1,000 mg by mouth in the morning and at bedtime.     Multiple Vitamins-Minerals (MULTIVITAMIN ADULT PO) Take 1 tablet by mouth daily.      omeprazole (PRILOSEC) 20 MG capsule Take 20 mg by mouth 2 (two) times daily.      potassium chloride (KLOR-CON) 10 MEQ tablet Take 1 tablet (10 mEq total) by mouth daily. For one week then stop. 7 tablet 0   rosuvastatin (CRESTOR) 40 MG tablet Take 1 tablet (40 mg total) by mouth daily. 30 tablet 1   Semaglutide (OZEMPIC, 1 MG/DOSE, [place]) Inject 1 mg into the skin every Sunday.      Trospium Chloride 60 MG CP24 Take 60 mg by mouth daily.     No current facility-administered medications for this visit.       Physical Exam: BP 133/72    Pulse 100    Temp 97.9 F (36.6 C) (Skin)    Resp 20    Ht 5\' 10"  (1.778 m)    Wt 211 lb (95.7 kg)    SpO2 94% Comment: RA   BMI 30.28 kg/m   General appearance: alert, cooperative and no distress Neurologic: intact Heart: regular rate and rhythm, S1, S2 normal, no murmur, click, rub or gallop Lungs: clear to auscultation bilaterally Abdomen: soft, non-tender; bowel sounds normal; no masses,  no organomegaly Extremities: extremities normal, atraumatic, no cyanosis or edema and Homans sign is negative, no sign of DVT Wound: Sternal incision is stable and well-healed   Diagnostic Studies & Laboratory data:     Recent Radiology Findings:   DG Chest 2 View  Result Date: 06/07/2019 CLINICAL DATA:  Coronary artery disease post CABG on 04/30/2019, shortness of breath, hypertension, diabetes mellitus, prostate cancer EXAM: CHEST - 2 VIEW COMPARISON:  05/03/2019 FINDINGS: Upper normal heart size post CABG. Mediastinal contours and pulmonary  vascularity normal. Persistent LEFT base atelectasis and small pleural effusion, slightly improved. Remaining lungs clear. No pneumothorax or acute osseous findings. IMPRESSION: Slightly improved LEFT basilar atelectasis and pleural effusion. Electronically Signed   By: Lavonia Dana M.D.   On: 06/07/2019 14:01    I have independently reviewed the above radiology studies  and reviewed the findings with the patient.    Recent Lab Findings: Lab Results  Component Value Date  WBC 7.8 05/23/2019   HGB 11.0 (L) 05/23/2019   HCT 34.8 (L) 05/23/2019   PLT 397 05/23/2019   GLUCOSE 99 05/23/2019   CHOL 93 04/28/2019   TRIG 167 (H) 04/28/2019   HDL 37 (L) 04/28/2019   LDLCALC 23 04/28/2019   ALT 33 04/28/2019   AST 27 04/28/2019   NA 140 05/23/2019   K 4.9 05/23/2019   CL 101 05/23/2019   CREATININE 0.85 05/23/2019   BUN 22 05/23/2019   CO2 21 05/23/2019   INR 1.3 (H) 04/30/2019   HGBA1C 7.2 (H) 04/28/2019      Assessment / Plan:   #1 status post coronary artery bypass grafting-done March 29th-patient is making good postoperative progress, he is increasing his physical activity appropriately he was cautioned about heavy lifting.  I encouraged him to enroll in cardiac rehab program at [facility] which is about 2 minutes from his house #2 hyperlipidemia-continues on statin therapy #3 insulin-dependent type 2 diabetic followed by the [facility]  Patient doing well and will be continued to be followed in [place] by Dr. Fletcher Anon   Medication Changes: No orders of the defined types were placed in this encounter.     Grace Isaac MD      [address] [place] [address] Office [phone]     06/07/2019 2:59 PM

## 2019-06-07 ENCOUNTER — Other Ambulatory Visit: Payer: Self-pay

## 2019-06-07 ENCOUNTER — Ambulatory Visit (INDEPENDENT_AMBULATORY_CARE_PROVIDER_SITE_OTHER): Payer: Self-pay | Admitting: Cardiothoracic Surgery

## 2019-06-07 ENCOUNTER — Ambulatory Visit
Admission: RE | Admit: 2019-06-07 | Discharge: 2019-06-07 | Disposition: A | Discharge status: Home / Self Care | Payer: No Typology Code available for payment source | Source: Ambulatory Visit | Attending: Cardiothoracic Surgery | Admitting: Cardiothoracic Surgery

## 2019-06-07 VITALS — BP 133/72 | HR 100 | Temp 97.9°F | Resp 20 | Ht 70.0 in | Wt 211.0 lb

## 2019-06-07 DIAGNOSIS — Z951 Presence of aortocoronary bypass graft: Secondary | ICD-10-CM

## 2019-06-07 DIAGNOSIS — R0602 Shortness of breath: Secondary | ICD-10-CM | POA: Diagnosis not present

## 2019-06-07 DIAGNOSIS — I2510 Atherosclerotic heart disease of native coronary artery without angina pectoris: Secondary | ICD-10-CM

## 2019-06-28 ENCOUNTER — Encounter: Payer: No Typology Code available for payment source | Attending: Internal Medicine

## 2019-06-28 ENCOUNTER — Other Ambulatory Visit: Payer: Self-pay

## 2019-06-28 DIAGNOSIS — Z951 Presence of aortocoronary bypass graft: Secondary | ICD-10-CM

## 2019-06-28 NOTE — Progress Notes (Signed)
Virtual Visit completed. Patient informed on EP and RD appointment and 6 Minute walk test. Patient also informed of patient health questionnaires on My Chart. Patient Verbalizes understanding. Visit diagnosis can be found in [facility] 06/06/2019.

## 2019-07-04 ENCOUNTER — Other Ambulatory Visit: Payer: Self-pay

## 2019-07-04 ENCOUNTER — Encounter: Payer: PPO | Attending: Internal Medicine

## 2019-07-04 VITALS — Ht 69.9 in | Wt 207.2 lb

## 2019-07-04 DIAGNOSIS — Z79899 Other long term (current) drug therapy: Secondary | ICD-10-CM | POA: Insufficient documentation

## 2019-07-04 DIAGNOSIS — Z7982 Long term (current) use of aspirin: Secondary | ICD-10-CM | POA: Insufficient documentation

## 2019-07-04 DIAGNOSIS — Z8546 Personal history of malignant neoplasm of prostate: Secondary | ICD-10-CM | POA: Diagnosis not present

## 2019-07-04 DIAGNOSIS — I10 Essential (primary) hypertension: Secondary | ICD-10-CM | POA: Diagnosis not present

## 2019-07-04 DIAGNOSIS — E785 Hyperlipidemia, unspecified: Secondary | ICD-10-CM | POA: Insufficient documentation

## 2019-07-04 DIAGNOSIS — E1142 Type 2 diabetes mellitus with diabetic polyneuropathy: Secondary | ICD-10-CM | POA: Insufficient documentation

## 2019-07-04 DIAGNOSIS — Z951 Presence of aortocoronary bypass graft: Secondary | ICD-10-CM | POA: Diagnosis not present

## 2019-07-04 DIAGNOSIS — Z794 Long term (current) use of insulin: Secondary | ICD-10-CM | POA: Insufficient documentation

## 2019-07-04 DIAGNOSIS — K219 Gastro-esophageal reflux disease without esophagitis: Secondary | ICD-10-CM | POA: Diagnosis not present

## 2019-07-04 DIAGNOSIS — I2510 Atherosclerotic heart disease of native coronary artery without angina pectoris: Secondary | ICD-10-CM | POA: Diagnosis not present

## 2019-07-04 NOTE — Progress Notes (Signed)
Cardiac Individual Treatment Plan  Patient Details  Name: Cristian Martin MRN: [id number] Date of Birth: 20-Apr-1945 Referring Provider:     Cardiac Rehab from 07/04/2019 in [facility] Cardiac and Pulmonary Rehab  Referring Provider  [name], MD  [facility] (local cardiologist- Dr. Arida)]      Initial Encounter Date:    Cardiac Rehab from 07/04/2019 in [facility] Cardiac and Pulmonary Rehab  Date  07/04/19      Visit Diagnosis: S/P CABG x 3  Patient's Home Medications on Admission:  Current Outpatient Medications:    acetaminophen (TYLENOL) 325 MG tablet, Take 2 tablets (650 mg total) by mouth every 6 (six) hours as needed for mild pain., Disp:  , Rfl:    amLODipine (NORVASC) 10 MG tablet, Take 10 mg by mouth daily., Disp: , Rfl:    aspirin EC 325 MG EC tablet, Take 1 tablet (325 mg total) by mouth daily., Disp: 30 tablet, Rfl: 0   calcium carbonate (TUMS - DOSED IN MG ELEMENTAL CALCIUM) 500 MG chewable tablet, Chew 2-3 tablets by mouth daily as needed for indigestion or heartburn., Disp: , Rfl:    carvedilol (COREG) 6.25 MG tablet, Take 3.125 mg by mouth 2 (two) times daily with a meal., Disp: , Rfl:    cloNIDine (CATAPRES) 0.3 MG tablet, Take 1 tablet (0.3 mg total) by mouth 2 (two) times daily., Disp: , Rfl:    doxazosin (CARDURA) 4 MG tablet, Take 2 mg by mouth at bedtime. , Disp: , Rfl:    DULoxetine (CYMBALTA) 60 MG capsule, Take 60 mg by mouth daily., Disp: , Rfl:    famotidine (PEPCID) 20 MG tablet, Take 20 mg by mouth 2 (two) times daily., Disp: , Rfl:    furosemide (LASIX) 20 MG tablet, Take 1 tablet (20 mg total) by mouth daily. For one week then stop, Disp: 7 tablet, Rfl: 0   guaiFENesin (MUCINEX) 600 MG 12 hr tablet, Take 1 tablet (600 mg total) by mouth 2 (two) times daily as needed for cough or to loosen phlegm. (Patient not taking: Reported on 06/28/2019), Disp: , Rfl:    insulin aspart protamine - aspart (NOVOLOG MIX 70/30 FLEXPEN) (70-30) 100 UNIT/ML FlexPen, Inject 0.2  mLs (20 Units total) into the skin 2 (two) times daily before a meal. As the patient and I discussed, start with a lower dose of Insulin as he has not been eating well. Titrate Insulin upward to dose taken prior to surgery (60 units bid) as tolerates., Disp: 15 mL, Rfl: 11   losartan (COZAAR) 50 MG tablet, Take 1 tablet (50 mg total) by mouth daily., Disp: 30 tablet, Rfl: 1   metFORMIN (GLUCOPHAGE-XR) 500 MG 24 hr tablet, Take 1,000 mg by mouth in the morning and at bedtime., Disp: , Rfl:    Multiple Vitamins-Minerals (MULTIVITAMIN ADULT PO), Take 1 tablet by mouth daily. , Disp: , Rfl:    omeprazole (PRILOSEC) 20 MG capsule, Take 20 mg by mouth 2 (two) times daily. , Disp: , Rfl:    potassium chloride (KLOR-CON) 10 MEQ tablet, Take 1 tablet (10 mEq total) by mouth daily. For one week then stop., Disp: 7 tablet, Rfl: 0   rosuvastatin (CRESTOR) 40 MG tablet, Take 1 tablet (40 mg total) by mouth daily., Disp: 30 tablet, Rfl: 1   Semaglutide (OZEMPIC, 1 MG/DOSE, [place]), Inject 1 mg into the skin every Sunday. , Disp: , Rfl:    Trospium Chloride 60 MG CP24, Take 60 mg by mouth daily., Disp: , Rfl:  Past Medical History: Past Medical History:  Diagnosis Date   Agent orange exposure    Coronary artery disease    a. 04/2019 Cath: LM 99, LAD 80p, D2 70, LCX 50ost/p, RCA 70p, EF 55-65%; b. 04/2019 CABG x 3 (LIMA to LAD, SVG to OM1, SVG to RCA).   Diabetic peripheral neuropathy (HCC)    Diastolic dysfunction    a 04/2019 Echo: EF 55-60%, mod conc LVH, Gr1 DD. Nl RV fxn. Mild Ao sclerosis w/o stenosis.   GERD (gastroesophageal reflux disease)    Hyperlipidemia    Hyperplasia of prostate with lower urinary tract symptoms (LUTS)    Hypertension    Prostate cancer [facility]) urologist-- dr ottelin/  oncologist-- dr Tammi Klippel   dx 02-21-2018  -- Stage T1c, Gleason 3+4   Type 2 diabetes mellitus treated with insulin ([place])    [facility] [place]   Wears dentures    upper    Wears  glasses    "wears to help double vision"    Tobacco Use: Social History   Tobacco Use  Smoking Status Never Smoker  Smokeless Tobacco Never Used    Labs: Recent Review Flowsheet Data    Labs for ITP Cardiac and Pulmonary Rehab Latest Ref Rng & Units 04/30/2019 04/30/2019 04/30/2019 04/30/2019 04/30/2019   Cholestrol 0 - 200 mg/dL - - - - -   LDLCALC 0 - 99 mg/dL - - - - -   HDL >40 mg/dL - - - - -   Trlycerides <150 mg/dL - - - - -   Hemoglobin A1c 4.8 - 5.6 % - - - - -   PHART 7.350 - 7.450 - 7.327(L) 7.324(L) 7.348(L) 7.365   PCO2ART 32.0 - 48.0 mmHg - 40.6 42.5 37.9 38.3   HCO3 20.0 - 28.0 mmol/L - 21.5 22.3 20.9 21.9   TCO2 22 - 32 mmol/L 26 23 24 22 23    ACIDBASEDEF 0.0 - 2.0 mmol/L - 5.0(H) 4.0(H) 4.0(H) 3.0(H)   O2SAT % - 91.0 90.0 93.0 94.0       Exercise Target Goals: Exercise Program Goal: Individual exercise prescription set using results from initial 6 min walk test and THRR while considering  patients activity barriers and safety.   Exercise Prescription Goal: Initial exercise prescription builds to 30-45 minutes a day of aerobic activity, 2-3 days per week.  Home exercise guidelines will be given to patient during program as part of exercise prescription that the participant will acknowledge.   Education: Aerobic Exercise & Resistance Training: - Gives group verbal and written instruction on the various components of exercise. Focuses on aerobic and resistive training programs and the benefits of this training and how to safely progress through these programs..   Education: Exercise & Equipment Safety: - Individual verbal instruction and demonstration of equipment use and safety with use of the equipment.   Cardiac Rehab from 06/28/2019 in [facility] Cardiac and Pulmonary Rehab  Date  06/28/19  Educator  [facility]  Instruction Review Code  1- Verbalizes Understanding      Education: Exercise Physiology & General Exercise Guidelines: - Group verbal and written  instruction with models to review the exercise physiology of the cardiovascular system and associated critical values. Provides general exercise guidelines with specific guidelines to those with heart or lung disease.    Education: Flexibility, Balance, Mind/Body Relaxation: Provides group verbal/written instruction on the benefits of flexibility and balance training, including mind/body exercise modes such as yoga, pilates and tai chi.  Demonstration and skill practice  provided.   Activity Barriers & Risk Stratification: Activity Barriers & Cardiac Risk Stratification - 07/04/19 1210      Activity Barriers & Cardiac Risk Stratification   Activity Barriers  Deconditioning;Muscular Weakness;Shortness of Breath;Other (comment);Balance Concerns;Joint Problems    Comments  Neuropathy, left shoulder bone spurs    Cardiac Risk Stratification  High       6 Minute Walk: 6 Minute Walk    Row Name 07/04/19 1205         6 Minute Walk   Phase  Initial     Distance  1024 feet     Walk Time  6 minutes     # of Rest Breaks  0     MPH  1.94     METS  2.54     RPE  13     VO2 Peak  8.91     Symptoms  No     Resting HR  101 bpm     Resting BP  132/64     Resting Oxygen Saturation   97 %     Exercise Oxygen Saturation  during 6 min walk  96 %     Max Ex. HR  121 bpm     Max Ex. BP  158/70     2 Minute Post BP  126/72        Oxygen Initial Assessment:   Oxygen Re-Evaluation:   Oxygen Discharge (Final Oxygen Re-Evaluation):   Initial Exercise Prescription: Initial Exercise Prescription - 07/04/19 1200      Date of Initial Exercise RX and Referring Provider   Date  07/04/19    Referring Provider  [name], MD    [place] (local cardiologist- Dr. Fletcher Anon)     Treadmill   MPH  1.8    Grade  0.5    Minutes  15    METs  2.5      REL-XR   Level  1    Speed  50    Minutes  15    METs  2.5      T5 Nustep   Level  1    SPM  80    Minutes  15    METs  2.5       Prescription Details   Frequency (times per week)  3    Duration  Progress to 30 minutes of continuous aerobic without signs/symptoms of physical distress      Intensity   THRR 40-80% of Max Heartrate  119-137    Ratings of Perceived Exertion  11-13    Perceived Dyspnea  0-4      Progression   Progression  Continue to progress workloads to maintain intensity without signs/symptoms of physical distress.      Resistance Training   Training Prescription  Yes    Weight  4 lb    Reps  10-15       Perform Capillary Blood Glucose checks as needed.  Exercise Prescription Changes: Exercise Prescription Changes    Row Name 07/04/19 1200             Response to Exercise   Blood Pressure (Admit)  132/64       Blood Pressure (Exercise)  158/70       Blood Pressure (Exit)  126/72       Heart Rate (Admit)  101 bpm       Heart Rate (Exercise)  121 bpm       Heart Rate (Exit)  101  bpm       Oxygen Saturation (Admit)  97 %       Oxygen Saturation (Exercise)  96 %       Rating of Perceived Exertion (Exercise)  13       Symptoms  None       Comments  Walk Test Results          Exercise Comments:   Exercise Goals and Review: Exercise Goals    Row Name 07/04/19 1230             Exercise Goals   Increase Physical Activity  Yes       Intervention  Provide advice, education, support and counseling about physical activity/exercise needs.;Develop an individualized exercise prescription for aerobic and resistive training based on initial evaluation findings, risk stratification, comorbidities and participant's personal goals.       Expected Outcomes  Short Term: Attend rehab on a regular basis to increase amount of physical activity.;Long Term: Exercising regularly at least 3-5 days a week.;Long Term: Add in home exercise to make exercise part of routine and to increase amount of physical activity.       Increase Strength and Stamina  Yes       Intervention  Provide advice, education,  support and counseling about physical activity/exercise needs.;Develop an individualized exercise prescription for aerobic and resistive training based on initial evaluation findings, risk stratification, comorbidities and participant's personal goals.       Expected Outcomes  Short Term: Increase workloads from initial exercise prescription for resistance, speed, and METs.;Short Term: Perform resistance training exercises routinely during rehab and add in resistance training at home;Long Term: Improve cardiorespiratory fitness, muscular endurance and strength as measured by increased METs and functional capacity (6MWT)       Able to understand and use rate of perceived exertion (RPE) scale  Yes       Intervention  Provide education and explanation on how to use RPE scale       Expected Outcomes  Short Term: Able to use RPE daily in rehab to express subjective intensity level;Long Term:  Able to use RPE to guide intensity level when exercising independently       Able to understand and use Dyspnea scale  Yes       Intervention  Provide education and explanation on how to use Dyspnea scale       Expected Outcomes  Short Term: Able to use Dyspnea scale daily in rehab to express subjective sense of shortness of breath during exertion;Long Term: Able to use Dyspnea scale to guide intensity level when exercising independently       Knowledge and understanding of Target Heart Rate Range (THRR)  Yes       Intervention  Provide education and explanation of THRR including how the numbers were predicted and where they are located for reference       Expected Outcomes  Short Term: Able to state/look up THRR;Long Term: Able to use THRR to govern intensity when exercising independently;Short Term: Able to use daily as guideline for intensity in rehab       Able to check pulse independently  Yes       Intervention  Provide education and demonstration on how to check pulse in carotid and radial arteries.;Review the  importance of being able to check your own pulse for safety during independent exercise       Expected Outcomes  Short Term: Able to explain why pulse checking is important  during independent exercise;Long Term: Able to check pulse independently and accurately       Understanding of Exercise Prescription  Yes       Intervention  Provide education, explanation, and written materials on patient's individual exercise prescription       Expected Outcomes  Short Term: Able to explain program exercise prescription;Long Term: Able to explain home exercise prescription to exercise independently          Exercise Goals Re-Evaluation :   Discharge Exercise Prescription (Final Exercise Prescription Changes): Exercise Prescription Changes - 07/04/19 1200      Response to Exercise   Blood Pressure (Admit)  132/64    Blood Pressure (Exercise)  158/70    Blood Pressure (Exit)  126/72    Heart Rate (Admit)  101 bpm    Heart Rate (Exercise)  121 bpm    Heart Rate (Exit)  101 bpm    Oxygen Saturation (Admit)  97 %    Oxygen Saturation (Exercise)  96 %    Rating of Perceived Exertion (Exercise)  13    Symptoms  None    Comments  Walk Test Results       Nutrition:  Target Goals: Understanding of nutrition guidelines, daily intake of sodium <1515m, cholesterol <2019m calories 30% from fat and 7% or less from saturated fats, daily to have 5 or more servings of fruits and vegetables.  Education: Controlling Sodium/Reading Food Labels -Group verbal and written material supporting the discussion of sodium use in heart healthy nutrition. Review and explanation with models, verbal and written materials for utilization of the food label.   Education: General Nutrition Guidelines/Fats and Fiber: -Group instruction provided by verbal, written material, models and posters to present the general guidelines for heart healthy nutrition. Gives an explanation and review of dietary fats and  fiber.   Biometrics: Pre Biometrics - 07/04/19 1231      Pre Biometrics   Height  5' 9.9" (1.775 m)    Weight  207 lb 3.2 oz (94 kg)    BMI (Calculated)  29.83    Single Leg Stand  1.9 seconds        Nutrition Therapy Plan and Nutrition Goals:   Nutrition Assessments: Nutrition Assessments - 07/04/19 1313      MEDFICTS Scores   Pre Score  66       MEDIFICTS Score Key:          ?70 Need to make dietary changes          40-70 Heart Healthy Diet         ? 40 Therapeutic Level Cholesterol Diet  Nutrition Goals Re-Evaluation:   Nutrition Goals Discharge (Final Nutrition Goals Re-Evaluation):   Psychosocial: Target Goals: Acknowledge presence or absence of significant depression and/or stress, maximize coping skills, provide positive support system. Participant is able to verbalize types and ability to use techniques and skills needed for reducing stress and depression.   Education: Depression - Provides group verbal and written instruction on the correlation between heart/lung disease and depressed mood, treatment options, and the stigmas associated with seeking treatment.   Education: Sleep Hygiene -Provides group verbal and written instruction about how sleep can affect your health.  Define sleep hygiene, discuss sleep cycles and impact of sleep habits. Review good sleep hygiene tips.     Education: Stress and Anxiety: - Provides group verbal and written instruction about the health risks of elevated stress and causes of high stress.  Discuss the correlation between heart/lung  disease and anxiety and treatment options. Review healthy ways to manage with stress and anxiety.    Initial Review & Psychosocial Screening: Initial Psych Review & Screening - 06/28/19 1113      Initial Review   Current issues with  Current Anxiety/Panic;Current Stress Concerns    Source of Stress Concerns  Chronic Illness      Family Dynamics   Good Support System?  Yes    Comments   Short: Start [facility] to help with mood. Long: Maintain a healthy mental state.      Barriers   Psychosocial barriers to participate in program  There are no identifiable barriers or psychosocial needs.;The patient should benefit from training in stress management and relaxation.      Screening Interventions   Interventions  Provide feedback about the scores to participant;To provide support and resources with identified psychosocial needs;Encouraged to exercise    Expected Outcomes  Short Term goal: Utilizing psychosocial counselor, staff and physician to assist with identification of specific Stressors or current issues interfering with healing process. Setting desired goal for each stressor or current issue identified.;Long Term Goal: Stressors or current issues are controlled or eliminated.;Short Term goal: Identification and review with participant of any Quality of Life or Depression concerns found by scoring the questionnaire.;Long Term goal: The participant improves quality of Life and PHQ9 Scores as seen by post scores and/or verbalization of changes       Quality of Life Scores:  Quality of Life - 07/04/19 1310      Quality of Life   Select  Quality of Life      Quality of Life Scores   Health/Function Pre  21.2 %    Socioeconomic Pre  24.88 %    Psych/Spiritual Pre  25.57 %    Family Pre  26.1 %    GLOBAL Pre  23.61 %      Scores of 19 and below usually indicate a poorer quality of life in these areas.  A difference of  2-3 points is a clinically meaningful difference.  A difference of 2-3 points in the total score of the Quality of Life Index has been associated with significant improvement in overall quality of life, self-image, physical symptoms, and general health in studies assessing change in quality of life.  PHQ-9: Recent Review Flowsheet Data    Depression screen [facility] 2/9 07/04/2019   Decreased Interest 2   Down, Depressed, Hopeless 0   PHQ - 2 Score 2   Altered  sleeping 0   Tired, decreased energy 3   Change in appetite 2   Feeling bad or failure about yourself  0   Trouble concentrating 1   Moving slowly or fidgety/restless 2   Suicidal thoughts 0   PHQ-9 Score 10   Difficult doing work/chores Somewhat difficult     Interpretation of Total Score  Total Score Depression Severity:  1-4 = Minimal depression, 5-9 = Mild depression, 10-14 = Moderate depression, 15-19 = Moderately severe depression, 20-27 = Severe depression   Psychosocial Evaluation and Intervention: Psychosocial Evaluation - 06/28/19 1119      Psychosocial Evaluation & Interventions   Interventions  Encouraged to exercise with the program and follow exercise prescription    Comments  Short: Start [facility] to help with mood. Long: Maintain a healthy mental state.    Expected Outcomes  Short: Start [facility] to help with mood. Long: Maintain a healthy mental state.    Continue Psychosocial Services   Follow up required  by staff       Psychosocial Re-Evaluation:   Psychosocial Discharge (Final Psychosocial Re-Evaluation):   Vocational Rehabilitation: Provide vocational rehab assistance to qualifying candidates.   Vocational Rehab Evaluation & Intervention:   Education: Education Goals: Education classes will be provided on a variety of topics geared toward better understanding of heart health and risk factor modification. Participant will state understanding/return demonstration of topics presented as noted by education test scores.  Learning Barriers/Preferences: Learning Barriers/Preferences - 06/28/19 1115      Learning Barriers/Preferences   Learning Barriers  None    Learning Preferences  None       General Cardiac Education Topics:  AED/CPR: - Group verbal and written instruction with the use of models to demonstrate the basic use of the AED with the basic ABC's of resuscitation.   Anatomy & Physiology of the Heart: - Group verbal and written  instruction and models provide basic cardiac anatomy and physiology, with the coronary electrical and arterial systems. Review of Valvular disease and Heart Failure   Cardiac Procedures: - Group verbal and written instruction to review commonly prescribed medications for heart disease. Reviews the medication, class of the drug, and side effects. Includes the steps to properly store meds and maintain the prescription regimen. (beta blockers and nitrates)   Cardiac Medications I: - Group verbal and written instruction to review commonly prescribed medications for heart disease. Reviews the medication, class of the drug, and side effects. Includes the steps to properly store meds and maintain the prescription regimen.   Cardiac Medications II: -Group verbal and written instruction to review commonly prescribed medications for heart disease. Reviews the medication, class of the drug, and side effects. (all other drug classes)    Go Sex-Intimacy & Heart Disease, Get SMART - Goal Setting: - Group verbal and written instruction through game format to discuss heart disease and the return to sexual intimacy. Provides group verbal and written material to discuss and apply goal setting through the application of the S.M.A.R.T. Method.   Other Matters of the Heart: - Provides group verbal, written materials and models to describe Stable Angina and Peripheral Artery. Includes description of the disease process and treatment options available to the cardiac patient.   Infection Prevention: - Provides verbal and written material to individual with discussion of infection control including proper hand washing and proper equipment cleaning during exercise session.   Cardiac Rehab from 06/28/2019 in [facility] Cardiac and Pulmonary Rehab  Date  06/28/19  Educator  [facility]  Instruction Review Code  1- Verbalizes Understanding      Falls Prevention: - Provides verbal and written material to individual with  discussion of falls prevention and safety.   Cardiac Rehab from 06/28/2019 in [facility] Cardiac and Pulmonary Rehab  Date  06/28/19  Educator  [facility]  Instruction Review Code  1- Verbalizes Understanding      Other: -Provides group and verbal instruction on various topics (see comments)   Knowledge Questionnaire Score: Knowledge Questionnaire Score - 07/04/19 1314      Knowledge Questionnaire Score   Pre Score  23/26- Exercise, PAD, Nutrition       Core Components/Risk Factors/Patient Goals at Admission: Personal Goals and Risk Factors at Admission - 07/04/19 1315      Core Components/Risk Factors/Patient Goals on Admission    Weight Management  Yes;Weight Loss    Intervention  Weight Management: Develop a combined nutrition and exercise program designed to reach desired caloric intake, while maintaining appropriate intake of nutrient and  fiber, sodium and fats, and appropriate energy expenditure required for the weight goal.;Weight Management: Provide education and appropriate resources to help participant work on and attain dietary goals.    Admit Weight  207 lb 3.2 oz (94 kg)    Goal Weight: Short Term  202 lb (91.6 kg)    Goal Weight: Long Term  197 lb (89.4 kg)    Expected Outcomes  Short Term: Continue to assess and modify interventions until short term weight is achieved;Long Term: Adherence to nutrition and physical activity/exercise program aimed toward attainment of established weight goal;Understanding recommendations for meals to include 15-35% energy as protein, 25-35% energy from fat, 35-60% energy from carbohydrates, less than 252m of dietary cholesterol, 20-35 gm of total fiber daily;Understanding of distribution of calorie intake throughout the day with the consumption of 4-5 meals/snacks;Weight Loss: Understanding of general recommendations for a balanced deficit meal plan, which promotes 1-2 lb weight loss per week and includes a negative energy balance of [id number] kcal/d     Diabetes  Yes    Intervention  Provide education about signs/symptoms and action to take for hypo/hyperglycemia.;Provide education about proper nutrition, including hydration, and aerobic/resistive exercise prescription along with prescribed medications to achieve blood glucose in normal ranges: Fasting glucose 65-99 mg/dL    Expected Outcomes  Short Term: Participant verbalizes understanding of the signs/symptoms and immediate care of hyper/hypoglycemia, proper foot care and importance of medication, aerobic/resistive exercise and nutrition plan for blood glucose control.;Long Term: Attainment of HbA1C < 7%.    Hypertension  Yes    Intervention  Provide education on lifestyle modifcations including regular physical activity/exercise, weight management, moderate sodium restriction and increased consumption of fresh fruit, vegetables, and low fat dairy, alcohol moderation, and smoking cessation.;Monitor prescription use compliance.    Expected Outcomes  Long Term: Maintenance of blood pressure at goal levels.;Short Term: Continued assessment and intervention until BP is < 140/952mHG in hypertensive participants. < 130/8069mG in hypertensive participants with diabetes, heart failure or chronic kidney disease.    Lipids  Yes    Intervention  Provide education and support for participant on nutrition & aerobic/resistive exercise along with prescribed medications to achieve LDL <11m65mDL >40mg39m Expected Outcomes  Short Term: Participant states understanding of desired cholesterol values and is compliant with medications prescribed. Participant is following exercise prescription and nutrition guidelines.;Long Term: Cholesterol controlled with medications as prescribed, with individualized exercise RX and with personalized nutrition plan. Value goals: LDL < 11mg,26m > 40 mg.       Education:Diabetes - Individual verbal and written instruction to review signs/symptoms of diabetes, desired ranges of  glucose level fasting, after meals and with exercise. Acknowledge that pre and post exercise glucose checks will be done for 3 sessions at entry of program.   [facility]  Date  06/28/19  Educator  JH  [facility] Code  1- Verbalizes Understanding      Education: Know Your Numbers and Risk Factors: -Group verbal and written instruction about important numbers in your health.  Discussion of what are risk factors and how they play a role in the disease process.  Review of Cholesterol, Blood Pressure, Diabetes, and BMI and the role they play in your overall health.   Core Components/Risk Factors/Patient Goals Review:    Core Components/Risk Factors/Patient Goals at Discharge (Final Review):    ITP Comments: ITP Comments    Row Name 06/28/19 1125 07/04/19 1204  ITP Comments  Virtual Visit completed. Patient informed on EP and RD appointment and 6 Minute walk test. Patient also informed of patient health questionnaires on My Chart. Patient Verbalizes understanding. Visit diagnosis can be found in [facility] 06/06/2019.  Completed 6MWT and gym orientation.  Initial ITP created and sent for review to Dr. Emily Filbert, Medical Director.         Comments: Initial ITP

## 2019-07-04 NOTE — Patient Instructions (Signed)
Patient Instructions  Patient Details  Name: Cristian Martin MRN: [id number] Date of Birth: 05-03-1945 Referring Provider:  [name], MD  Below are your personal goals for exercise, nutrition, and risk factors. Our goal is to help you stay on track towards obtaining and maintaining these goals. We will be discussing your progress on these goals with you throughout the program.  Initial Exercise Prescription: Initial Exercise Prescription - 07/04/19 1200      Date of Initial Exercise RX and Referring Provider   Date  07/04/19    Referring Provider  [name], MD    [place] (local cardiologist- Dr. Fletcher Anon)     Treadmill   MPH  1.8    Grade  0.5    Minutes  15    METs  2.5      REL-XR   Level  1    Speed  50    Minutes  15    METs  2.5      T5 Nustep   Level  1    SPM  80    Minutes  15    METs  2.5      Prescription Details   Frequency (times per week)  3    Duration  Progress to 30 minutes of continuous aerobic without signs/symptoms of physical distress      Intensity   THRR 40-80% of Max Heartrate  119-137    Ratings of Perceived Exertion  11-13    Perceived Dyspnea  0-4      Progression   Progression  Continue to progress workloads to maintain intensity without signs/symptoms of physical distress.      Resistance Training   Training Prescription  Yes    Weight  4 lb    Reps  10-15       Exercise Goals: Frequency: Be able to perform aerobic exercise two to three times per week in program working toward 2-5 days per week of home exercise.  Intensity: Work with a perceived exertion of 11 (fairly light) - 15 (hard) while following your exercise prescription.  We will make changes to your prescription with you as you progress through the program.   Duration: Be able to do 30 to 45 minutes of continuous aerobic exercise in addition to a 5 minute warm-up and a 5 minute cool-down routine.   Nutrition Goals: Your personal nutrition goals will be established  when you do your nutrition analysis with the dietician.  The following are general nutrition guidelines to follow: Cholesterol < 200mg /day Sodium < 1500mg /day Fiber: Men over 50 yrs - 30 grams per day  Personal Goals: Personal Goals and Risk Factors at Admission - 07/04/19 1315      Core Components/Risk Factors/Patient Goals on Admission    Weight Management  Yes;Weight Loss    Intervention  Weight Management: Develop a combined nutrition and exercise program designed to reach desired caloric intake, while maintaining appropriate intake of nutrient and fiber, sodium and fats, and appropriate energy expenditure required for the weight goal.;Weight Management: Provide education and appropriate resources to help participant work on and attain dietary goals.    Admit Weight  207 lb 3.2 oz (94 kg)    Goal Weight: Short Term  202 lb (91.6 kg)    Goal Weight: Long Term  197 lb (89.4 kg)    Expected Outcomes  Short Term: Continue to assess and modify interventions until short term weight is achieved;Long Term: Adherence to nutrition and physical activity/exercise program aimed toward  attainment of established weight goal;Understanding recommendations for meals to include 15-35% energy as protein, 25-35% energy from fat, 35-60% energy from carbohydrates, less than 200mg  of dietary cholesterol, 20-35 gm of total fiber daily;Understanding of distribution of calorie intake throughout the day with the consumption of 4-5 meals/snacks;Weight Loss: Understanding of general recommendations for a balanced deficit meal plan, which promotes 1-2 lb weight loss per week and includes a negative energy balance of [phone] kcal/d    Diabetes  Yes    Intervention  Provide education about signs/symptoms and action to take for hypo/hyperglycemia.;Provide education about proper nutrition, including hydration, and aerobic/resistive exercise prescription along with prescribed medications to achieve blood glucose in normal  ranges: Fasting glucose 65-99 mg/dL    Expected Outcomes  Short Term: Participant verbalizes understanding of the signs/symptoms and immediate care of hyper/hypoglycemia, proper foot care and importance of medication, aerobic/resistive exercise and nutrition plan for blood glucose control.;Long Term: Attainment of HbA1C < 7%.    Hypertension  Yes    Intervention  Provide education on lifestyle modifcations including regular physical activity/exercise, weight management, moderate sodium restriction and increased consumption of fresh fruit, vegetables, and low fat dairy, alcohol moderation, and smoking cessation.;Monitor prescription use compliance.    Expected Outcomes  Long Term: Maintenance of blood pressure at goal levels.;Short Term: Continued assessment and intervention until BP is < 140/17mm HG in hypertensive participants. < 130/46mm HG in hypertensive participants with diabetes, heart failure or chronic kidney disease.    Lipids  Yes    Intervention  Provide education and support for participant on nutrition & aerobic/resistive exercise along with prescribed medications to achieve LDL 70mg , HDL >40mg .    Expected Outcomes  Short Term: Participant states understanding of desired cholesterol values and is compliant with medications prescribed. Participant is following exercise prescription and nutrition guidelines.;Long Term: Cholesterol controlled with medications as prescribed, with individualized exercise RX and with personalized nutrition plan. Value goals: LDL < 70mg , HDL > 40 mg.       Tobacco Use Initial Evaluation: Social History   Tobacco Use  Smoking Status Never Smoker  Smokeless Tobacco Never Used    Exercise Goals and Review: Exercise Goals    Row Name 07/04/19 1230             Exercise Goals   Increase Physical Activity  Yes       Intervention  Provide advice, education, support and counseling about physical activity/exercise needs.;Develop an individualized exercise  prescription for aerobic and resistive training based on initial evaluation findings, risk stratification, comorbidities and participant's personal goals.       Expected Outcomes  Short Term: Attend rehab on a regular basis to increase amount of physical activity.;Long Term: Exercising regularly at least 3-5 days a week.;Long Term: Add in home exercise to make exercise part of routine and to increase amount of physical activity.       Increase Strength and Stamina  Yes       Intervention  Provide advice, education, support and counseling about physical activity/exercise needs.;Develop an individualized exercise prescription for aerobic and resistive training based on initial evaluation findings, risk stratification, comorbidities and participant's personal goals.       Expected Outcomes  Short Term: Increase workloads from initial exercise prescription for resistance, speed, and METs.;Short Term: Perform resistance training exercises routinely during rehab and add in resistance training at home;Long Term: Improve cardiorespiratory fitness, muscular endurance and strength as measured by increased METs and functional capacity (6MWT)  Able to understand and use rate of perceived exertion (RPE) scale  Yes       Intervention  Provide education and explanation on how to use RPE scale       Expected Outcomes  Short Term: Able to use RPE daily in rehab to express subjective intensity level;Long Term:  Able to use RPE to guide intensity level when exercising independently       Able to understand and use Dyspnea scale  Yes       Intervention  Provide education and explanation on how to use Dyspnea scale       Expected Outcomes  Short Term: Able to use Dyspnea scale daily in rehab to express subjective sense of shortness of breath during exertion;Long Term: Able to use Dyspnea scale to guide intensity level when exercising independently       Knowledge and understanding of Target Heart Rate Range (THRR)  Yes        Intervention  Provide education and explanation of THRR including how the numbers were predicted and where they are located for reference       Expected Outcomes  Short Term: Able to state/look up THRR;Long Term: Able to use THRR to govern intensity when exercising independently;Short Term: Able to use daily as guideline for intensity in rehab       Able to check pulse independently  Yes       Intervention  Provide education and demonstration on how to check pulse in carotid and radial arteries.;Review the importance of being able to check your own pulse for safety during independent exercise       Expected Outcomes  Short Term: Able to explain why pulse checking is important during independent exercise;Long Term: Able to check pulse independently and accurately       Understanding of Exercise Prescription  Yes       Intervention  Provide education, explanation, and written materials on patient's individual exercise prescription       Expected Outcomes  Short Term: Able to explain program exercise prescription;Long Term: Able to explain home exercise prescription to exercise independently          Copy of goals given to participant.

## 2019-07-05 ENCOUNTER — Telehealth: Payer: Self-pay

## 2019-07-05 ENCOUNTER — Other Ambulatory Visit: Payer: Self-pay

## 2019-07-05 ENCOUNTER — Telehealth: Payer: Self-pay | Admitting: Cardiovascular Disease

## 2019-07-05 ENCOUNTER — Ambulatory Visit (INDEPENDENT_AMBULATORY_CARE_PROVIDER_SITE_OTHER): Payer: No Typology Code available for payment source | Admitting: Gastroenterology

## 2019-07-05 ENCOUNTER — Encounter: Payer: Self-pay | Admitting: Gastroenterology

## 2019-07-05 VITALS — BP 118/80 | HR 104 | Temp 98.2°F | Wt 207.1 lb

## 2019-07-05 DIAGNOSIS — D509 Iron deficiency anemia, unspecified: Secondary | ICD-10-CM

## 2019-07-05 DIAGNOSIS — K219 Gastro-esophageal reflux disease without esophagitis: Secondary | ICD-10-CM | POA: Diagnosis not present

## 2019-07-05 DIAGNOSIS — R142 Eructation: Secondary | ICD-10-CM | POA: Diagnosis not present

## 2019-07-05 DIAGNOSIS — Z1211 Encounter for screening for malignant neoplasm of colon: Secondary | ICD-10-CM

## 2019-07-05 DIAGNOSIS — K449 Diaphragmatic hernia without obstruction or gangrene: Secondary | ICD-10-CM

## 2019-07-05 MED ORDER — NA SULFATE-K SULFATE-MG SULF 17.5-3.13-1.6 GM/177ML PO SOLN: 354.0000 mL | Freq: Once | ORAL | 0 refills | Status: AC

## 2019-07-05 NOTE — Progress Notes (Signed)
preop

## 2019-07-05 NOTE — Patient Instructions (Signed)
High-Fiber Diet Fiber, also called dietary fiber, is a type of carbohydrate that is found in fruits, vegetables, whole grains, and beans. A high-fiber diet can have many health benefits. Your health care provider may recommend a high-fiber diet to help:  Prevent constipation. Fiber can make your bowel movements more regular.  Lower your cholesterol.  Relieve the following conditions: ? Swelling of veins in the anus (hemorrhoids). ? Swelling and irritation (inflammation) of specific areas of the digestive tract (uncomplicated diverticulosis). ? A problem of the large intestine (colon) that sometimes causes pain and diarrhea (irritable bowel syndrome, IBS).  Prevent overeating as part of a weight-loss plan.  Prevent heart disease, type 2 diabetes, and certain cancers. What is my plan? The recommended daily fiber intake in grams (g) includes:  38 g for men age 50 or younger.  30 g for men over age 50.  25 g for women age 50 or younger.  21 g for women over age 50. You can get the recommended daily intake of dietary fiber by:  Eating a variety of fruits, vegetables, grains, and beans.  Taking a fiber supplement, if it is not possible to get enough fiber through your diet. What do I need to know about a high-fiber diet?  It is better to get fiber through food sources rather than from fiber supplements. There is not a lot of research about how effective supplements are.  Always check the fiber content on the nutrition facts label of any prepackaged food. Look for foods that contain 5 g of fiber or more per serving.  Talk with a diet and nutrition specialist (dietitian) if you have questions about specific foods that are recommended or not recommended for your medical condition, especially if those foods are not listed below.  Gradually increase how much fiber you consume. If you increase your intake of dietary fiber too quickly, you may have bloating, cramping, or gas.  Drink plenty  of water. Water helps you to digest fiber. What are tips for following this plan?  Eat a wide variety of high-fiber foods.  Make sure that half of the grains that you eat each day are whole grains.  Eat breads and cereals that are made with whole-grain flour instead of refined flour or white flour.  Eat brown rice, bulgur wheat, or millet instead of white rice.  Start the day with a breakfast that is high in fiber, such as a cereal that contains 5 g of fiber or more per serving.  Use beans in place of meat in soups, salads, and pasta dishes.  Eat high-fiber snacks, such as berries, raw vegetables, nuts, and popcorn.  Choose whole fruits and vegetables instead of processed forms like juice or sauce. What foods can I eat?  Fruits Berries. Pears. Apples. Oranges. Avocado. Prunes and raisins. Dried figs. Vegetables Sweet potatoes. Spinach. Kale. Artichokes. Cabbage. Broccoli. Cauliflower. Green peas. Carrots. Squash. Grains Whole-grain breads. Multigrain cereal. Oats and oatmeal. Brown rice. Barley. Bulgur wheat. Millet. Quinoa. Bran muffins. Popcorn. Rye wafer crackers. Meats and other proteins Navy, kidney, and pinto beans. Soybeans. Split peas. Lentils. Nuts and seeds. Dairy Fiber-fortified yogurt. Beverages Fiber-fortified soy milk. Fiber-fortified orange juice. Other foods Fiber bars. The items listed above may not be a complete list of recommended foods and beverages. Contact a dietitian for more options. What foods are not recommended? Fruits Fruit juice. Cooked, strained fruit. Vegetables Fried potatoes. Canned vegetables. Well-cooked vegetables. Grains White bread. Pasta made with refined flour. White rice. Meats and other   proteins Fatty cuts of meat. Fried chicken or fried fish. Dairy Milk. Yogurt. Cream cheese. Sour cream. Fats and oils Butters. Beverages Soft drinks. Other foods Cakes and pastries. The items listed above may not be a complete list of foods  and beverages to avoid. Contact a dietitian for more information. Summary  Fiber is a type of carbohydrate. It is found in fruits, vegetables, whole grains, and beans.  There are many health benefits of eating a high-fiber diet, such as preventing constipation, lowering blood cholesterol, helping with weight loss, and reducing your risk of heart disease, diabetes, and certain cancers.  Gradually increase your intake of fiber. Increasing too fast can result in cramping, bloating, and gas. Drink plenty of water while you increase your fiber.  The best sources of fiber include whole fruits and vegetables, whole grains, nuts, seeds, and beans. This information is not intended to replace advice given to you by your health care provider. Make sure you discuss any questions you have with your health care provider. Document Revised: 11/22/2016 Document Reviewed: 11/22/2016 Elsevier Patient Education  2020 [employer] for Gastroesophageal Reflux Disease, Adult When you have gastroesophageal reflux disease (GERD), the foods you eat and your eating habits are very important. Choosing the right foods can help ease your discomfort. Think about working with a nutrition specialist (dietitian) to help you make good choices. What are tips for following this plan?  Meals  Choose healthy foods that are low in fat, such as fruits, vegetables, whole grains, low-fat dairy products, and lean meat, fish, and poultry.  Eat small meals often instead of 3 large meals a day. Eat your meals slowly, and in a place where you are relaxed. Avoid bending over or lying down until 2-3 hours after eating.  Avoid eating meals 2-3 hours before bed.  Avoid drinking a lot of liquid with meals.  Cook foods using methods other than frying. Bake, grill, or broil food instead.  Avoid or limit: ? Chocolate. ? Peppermint or spearmint. ? Alcohol. ? Pepper. ? Black and decaffeinated coffee. ? Black and  decaffeinated tea. ? Bubbly (carbonated) soft drinks. ? Caffeinated energy drinks and soft drinks.  Limit high-fat foods such as: ? Fatty meat or fried foods. ? Whole milk, cream, butter, or ice cream. ? Nuts and nut butters. ? Pastries, donuts, and sweets made with butter or shortening.  Avoid foods that cause symptoms. These foods may be different for everyone. Common foods that cause symptoms include: ? Tomatoes. ? Oranges, lemons, and limes. ? Peppers. ? Spicy food. ? Onions and garlic. ? Vinegar. Lifestyle  Maintain a healthy weight. Ask your doctor what weight is healthy for you. If you need to lose weight, work with your doctor to do so safely.  Exercise for at least 30 minutes for 5 or more days each week, or as told by your doctor.  Wear loose-fitting clothes.  Do not smoke. If you need help quitting, ask your doctor.  Sleep with the head of your bed higher than your feet. Use a wedge under the mattress or blocks under the bed frame to raise the head of the bed. Summary  When you have gastroesophageal reflux disease (GERD), food and lifestyle choices are very important in easing your symptoms.  Eat small meals often instead of 3 large meals a day. Eat your meals slowly, and in a place where you are relaxed.  Limit high-fat foods such as fatty meat or fried foods.  Avoid bending over or  lying down until 2-3 hours after eating.  Avoid peppermint and spearmint, caffeine, alcohol, and chocolate. This information is not intended to replace advice given to you by your health care provider. Make sure you discuss any questions you have with your health care provider. Document Revised: 05/11/2018 Document Reviewed: 02/24/2016 Elsevier Patient Education  [employer].

## 2019-07-05 NOTE — Telephone Encounter (Signed)
° °  [facility] Pre-operative Risk Assessment    Request for surgical clearance:  1. What type of surgery is being performed? Colonoscopy and EGD   2. When is this surgery scheduled? 08/16/2019  2. Are there any medications that need to be held prior to surgery and how long? Asprin 366m   3. Practice name and name of physician performing [facility]? [facility] VM[facility]itch  4. What is your office phone and fax number?  [phone] [phone] 5. Anesthesia type (None, local, MAC, general) ? General    [name]6/04/2019, 3:45 PM  _________________________________________________________________   (provider comments below)

## 2019-07-05 NOTE — Telephone Encounter (Signed)
   [facility] Pre-operative Risk Assessment    HEARTCARE STAFF: - Please ensure there is not already an duplicate clearance open for this procedure. - Under Visit Info/Reason for Call, type in Other and utilize the format Clearance MM/DD/YY or Clearance TBD. Do not use dashes or single digits. - If request is for dental extraction, please clarify the # of teeth to be extracted.  Request for surgical clearance:  1. What type of surgery is being performed? Colonoscopy and EGD  2. When is this surgery scheduled? 08/16/19  3. What type of clearance is required (medical clearance vs. Pharmacy clearance to hold med vs. Both)? both  4. Are there any medications that need to be held prior to surgery and how long? Needs to stop Aspirin 325 MG    5. Practice name and name of physician performing surgery? [facility]   6. What is the office phone number? [phone]   7.   What is the office fax number? [place]   Anesthesia type (None, local, MAC, general) ? general   Ace Gins 07/05/2019, 4:02 PM  _________________________________________________________________   (provider comments below)

## 2019-07-05 NOTE — Progress Notes (Signed)
Cephas Darby, MD [address]  [place]  [place], [place] [address]  Main: [phone]  Fax: [phone]    Gastroenterology Consultation  Referring Provider:     [facility] Primary Gastroenterologist:  Dr. Cephas Darby Reason for Consultation:     History of GERD, frequent burping, abdominal bloating        HPI:   Cristian Martin is a 74 y.o. male referred by Dr[facility] Dom[facility]  for consultation & management of chronic GERD, frequent burping and abdominal bloating.  Chronic GERD: Patient reports that he has been having long history of reflux symptoms for which he has been taking omeprazole 20 mg 2 times a day before meals.  Currently, he reports that his heartburn is under control.  However reports frequent burping almost on a daily basis.  He also reports abdominal bloating.  He is also on Pepcid 20 mg 2 times a day.  Patient recently underwent CABG in 05/2019, on aspirin 325 mg daily.  Closely followed by his cardiologist, Dr. Fletcher Anon.  He is currently undergoing cardiac rehab.  He has preserved cardiac function  He is found to have normocytic anemia  He also reports irregular bowel habits, bowel movements every 2 days, worsening of abdominal bloating.  He does acknowledge drinking diet sodas which she has cut back significantly.  He has history of poorly controlled diabetes, currently on insulin and Metformin.  His hemoglobin A1c has significantly improved  NSAIDs: Aspirin 325 mg for history of coronary artery disease  Antiplts/Anticoagulants/Anti thrombotics: Aspirin 325 mg for history of coronary artery disease  GI Procedures: Colonoscopy in 2015, found to have hyperplastic polyp in the rectum, full report not available.  Patient reports that he is due for another colonoscopy  Past Medical History:  Diagnosis Date  . Agent orange exposure   . Coronary artery disease    a. 04/2019 Cath: LM 99, LAD 80p, D2 70,  LCX 50ost/p, RCA 70p, EF 55-65%; b. 04/2019 CABG x 3 (LIMA to LAD, SVG to OM1, SVG to RCA).  . Diabetic peripheral neuropathy ([place])   . Diastolic dysfunction    a 04/2019 Echo: EF 55-60%, mod conc LVH, Gr1 DD. Nl RV fxn. Mild Ao sclerosis w/o stenosis.  Marland Kitchen GERD (gastroesophageal reflux disease)   . Hyperlipidemia   . Hyperplasia of prostate with lower urinary tract symptoms (LUTS)   . Hypertension   . Prostate cancer [facility]) urologist-- dr ottelin/  oncologist-- dr Tammi Klippel   dx 02-21-2018  -- Stage T1c, Gleason 3+4  . Type 2 diabetes mellitus treated with insulin ([place])    followed by [facility] in [place]  . Wears dentures    upper   . Wears glasses    "wears to help double vision"    Past Surgical History:  Procedure Laterality Date  . CORONARY ARTERY BYPASS GRAFT N/A 04/30/2019   Procedure: CORONARY ARTERY BYPASS GRAFTING (CABG) TIMES THREE USING LEFT INTERNAL MAMMARY AND RIGHT GREATER SAPHENOUS VEIN HARVESTED ENDOSCOPICALLY. MAMMARY ARTERY TO LAD, SAPHENOUS VEIN GRAFT TO  OM1, SAPHENOUS VEING GRAFT TO RIGHT. BIOPSY OF MEDIASTINAL TISSUE PLACEMENT OF RIGHT FEMORAL A-LINE;  Surgeon: Grace Isaac, MD;  Location: [facility];  Service: Open Heart Surgery;  Laterality: N/A;  . CYSTOSCOPY N/A 07/14/2018   Procedure: CYSTOSCOPY;  Surgeon: Kathie Rhodes, MD;  [facility];  Service: Urology;  Laterality: N/A;  no seeds in bladder per Dr Karsten Ro  . LEFT  HEART CATH AND CORONARY ANGIOGRAPHY N/A 04/27/2019   Procedure: LEFT HEART CATH AND CORONARY ANGIOGRAPHY;  Surgeon: Wellington Hampshire, MD;  Location: [facility];  Service: Cardiovascular;  Laterality: N/A;  . PROSTATE BIOPSY  02-21-2018  dr Karsten Ro in office  . RADIOACTIVE SEED IMPLANT N/A 07/14/2018   Procedure: RADIOACTIVE SEED IMPLANT/BRACHYTHERAPY IMPLANT;  Surgeon: Kathie Rhodes, MD;  [facility];  Service: Urology;  Laterality: N/A;  77 seeds   . SHOULDER ARTHROSCOPY Left 2018   in [place]    "remove spur"  . SPACE OAR INSTILLATION N/A 07/14/2018   Procedure: SPACE OAR INSTILLATION;  Surgeon: Kathie Rhodes, MD;  Location: [facility];  Service: Urology;  Laterality: N/A;  . TEE WITHOUT CARDIOVERSION N/A 04/30/2019   Procedure: TRANSESOPHAGEAL ECHOCARDIOGRAM (TEE);  Surgeon: Grace Isaac, MD;  Location: [facility];  Service: Open Heart Surgery;  Laterality: N/A;    Current Outpatient Medications:  .  acetaminophen (TYLENOL) 325 MG tablet, Take 2 tablets (650 mg total) by mouth every 6 (six) hours as needed for mild pain., Disp:  , Rfl:  .  amLODipine (NORVASC) 10 MG tablet, Take 10 mg by mouth daily., Disp: , Rfl:  .  aspirin EC 325 MG EC tablet, Take 1 tablet (325 mg total) by mouth daily., Disp: 30 tablet, Rfl: 0 .  calcium carbonate (TUMS - DOSED IN MG ELEMENTAL CALCIUM) 500 MG chewable tablet, Chew 2-3 tablets by mouth daily as needed for indigestion or heartburn., Disp: , Rfl:  .  carvedilol (COREG) 6.25 MG tablet, Take 3.125 mg by mouth 2 (two) times daily with a meal., Disp: , Rfl:  .  cloNIDine (CATAPRES) 0.3 MG tablet, Take 1 tablet (0.3 mg total) by mouth 2 (two) times daily., Disp: , Rfl:  .  doxazosin (CARDURA) 4 MG tablet, Take 2 mg by mouth at bedtime. , Disp: , Rfl:  .  DULoxetine (CYMBALTA) 60 MG capsule, Take 60 mg by mouth daily., Disp: , Rfl:  .  famotidine (PEPCID) 20 MG tablet, Take 20 mg by mouth 2 (two) times daily., Disp: , Rfl:  .  guaiFENesin (MUCINEX) 600 MG 12 hr tablet, Take 1 tablet (600 mg total) by mouth 2 (two) times daily as needed for cough or to loosen phlegm., Disp: , Rfl:  .  insulin aspart protamine - aspart (NOVOLOG MIX 70/30 FLEXPEN) (70-30) 100 UNIT/ML FlexPen, Inject 0.2 mLs (20 Units total) into the skin 2 (two) times daily before a meal. As the patient and I discussed, start with a lower dose of Insulin as he has not been eating well. Titrate Insulin upward to dose taken prior to surgery (60 units bid) as tolerates., Disp: 15 mL,  Rfl: 11 .  losartan (COZAAR) 50 MG tablet, Take 1 tablet (50 mg total) by mouth daily., Disp: 30 tablet, Rfl: 1 .  metFORMIN (GLUCOPHAGE-XR) 500 MG 24 hr tablet, Take 1,000 mg by mouth in the morning and at bedtime., Disp: , Rfl:  .  Multiple Vitamins-Minerals (MULTIVITAMIN ADULT PO), Take 1 tablet by mouth daily. , Disp: , Rfl:  .  omeprazole (PRILOSEC) 20 MG capsule, Take 20 mg by mouth 2 (two) times daily. , Disp: , Rfl:  .  rosuvastatin (CRESTOR) 40 MG tablet, Take 1 tablet (40 mg total) by mouth daily., Disp: 30 tablet, Rfl: 1 .  Semaglutide (OZEMPIC, 1 MG/DOSE, [place]), Inject 1 mg into the skin every Sunday. , Disp: , Rfl:  .  Trospium Chloride 60 MG CP24,  Take 60 mg by mouth daily., Disp: , Rfl:  .  furosemide (LASIX) 20 MG tablet, Take 1 tablet (20 mg total) by mouth daily. For one week then stop (Patient not taking: Reported on 07/05/2019), Disp: 7 tablet, Rfl: 0 .  Na Sulfate-K Sulfate-Mg Sulf 17.5-3.13-1.6 GM/177ML SOLN, Take 354 mLs by mouth once for 1 dose., Disp: 354 mL, Rfl: 0 .  potassium chloride (KLOR-CON) 10 MEQ tablet, Take 1 tablet (10 mEq total) by mouth daily. For one week then stop. (Patient not taking: Reported on 07/05/2019), Disp: 7 tablet, Rfl: 0   Family History  Problem Relation Age of Onset  . Stroke Mother   . Diabetes Father   . Hypertension Father   . Diabetes Sister   . Cancer Neg Hx      Social History   Tobacco Use  . Smoking status: Never Smoker  . Smokeless tobacco: Never Used  Substance Use Topics  . Alcohol use: Not Currently  . Drug use: Never    Allergies as of 07/05/2019 - Review Complete 07/05/2019  Allergen Reaction Noted  . Ace inhibitors Other (See Comments) 08/01/2013    Review of Systems:    All systems reviewed and negative except where noted in HPI.   Physical Exam:  BP 118/80 (BP Location: Left Arm, Patient Position: Sitting, Cuff Size: Normal)   Pulse (!) 104   Temp 98.2 F (36.8 C) (Oral)   Wt 207 lb 2 oz (94 kg)   BMI  29.80 kg/m  No LMP for male patient.  General:   Alert,  Well-developed, well-nourished, pleasant and cooperative in NAD Head:  Normocephalic and atraumatic. Eyes:  Sclera clear, no icterus.   Conjunctiva pink. Ears:  Normal auditory acuity. Nose:  No deformity, discharge, or lesions. Mouth:  No deformity or lesions,oropharynx pink & moist. Neck:  Supple; no masses or thyromegaly. Lungs:  Respirations even and unlabored.  Clear throughout to auscultation.   No wheezes, crackles, or rhonchi. No acute distress. Heart:  Regular rate and rhythm; no murmurs, clicks, rubs, or gallops. Abdomen:  Normal bowel sounds. Soft, non-tender and mildly distended without masses, hepatosplenomegaly or hernias noted.  No guarding or rebound tenderness.   Rectal: Not performed Msk:  Symmetrical without gross deformities. Good, equal movement & strength bilaterally. Pulses:  Normal pulses noted. Extremities:  No clubbing or edema.  No cyanosis. Neurologic:  Alert and oriented x3;  grossly normal neurologically. Skin:  Intact without significant lesions or rashes. No jaundice. Lymph Nodes:  No significant cervical adenopathy. Psych:  Alert and cooperative. Normal mood and affect.  Imaging Studies: No abdominal imaging  Assessment and Plan:   Cristian Martin is a 74 y.o. male with history of metabolic syndrome, coronary artery disease, triple-vessel bypass in 05/2019 on aspirin 325 mg daily, chronic GERD, constipation and abdominal bloating  Chronic GERD and burping Continue omeprazole 20 mg twice daily before meals Discussed about antireflux lifestyle, information provided Strongly advised to stop carbonated beverages Recommend EGD for further evaluation after cardiac clearance  Chronic constipation and abdominal bloating Discussed about high-fiber diet, stool softener such as MiraLAX daily Stop diet sodas Adequate intake of water  Colon cancer screening Recommend colonoscopy after cardiac  clearance  Normocytic anemia Recommend iron studies, B12 and folate panel   Follow up in 3 months   Cephas Darby, MD

## 2019-07-06 NOTE — Telephone Encounter (Signed)
Dr. Fletcher Anon to review aspirin, can the patient hold aspirin for the low risk procedure? He just underwent CABG on 04/30/2019 by Dr. Servando Snare. The date of the endscopy/colonoscopy is 7/15 which is outside 3 month period after CABG, or would you prefer to proceed on low dose of aspirin 81mg  instead if GI doctor is willing?

## 2019-07-06 NOTE — Telephone Encounter (Signed)
Unclear if this is pharmacy clearance +/- medical clearance. Patient has upcoming visit with Ignacia Bayley NP prior to the colonoscopy and endoscopy procedure. Cardiac clearance can be given at that time, note, Dr. Fletcher Anon is ok for him to decrease aspirin to 81mg  daily from now since he is >3 month out from CABG, however recommend him to continue on the low dose aspirin through the procedure.   Callback pool, please also update the title of next cardiology followup to include cardiac clearance.

## 2019-07-06 NOTE — Telephone Encounter (Signed)
The patient can decrease the dose of aspirin to 81 mg daily but should not be interrupted for EGD/colonoscopy given that his CABG was within a year.

## 2019-07-09 ENCOUNTER — Other Ambulatory Visit: Payer: Self-pay

## 2019-07-09 ENCOUNTER — Other Ambulatory Visit
Admission: RE | Admit: 2019-07-09 | Discharge: 2019-07-09 | Disposition: A | Discharge status: Home / Self Care | Payer: No Typology Code available for payment source | Source: Ambulatory Visit | Attending: Gastroenterology | Admitting: Gastroenterology

## 2019-07-09 DIAGNOSIS — D509 Iron deficiency anemia, unspecified: Secondary | ICD-10-CM | POA: Insufficient documentation

## 2019-07-09 DIAGNOSIS — Z951 Presence of aortocoronary bypass graft: Secondary | ICD-10-CM

## 2019-07-09 LAB — GLUCOSE, CAPILLARY
Glucose-Capillary: 64 mg/dL — ABNORMAL LOW (ref 70–99)
Glucose-Capillary: 83 mg/dL (ref 70–99)

## 2019-07-09 LAB — FERRITIN: Ferritin: 15 ng/mL — ABNORMAL LOW (ref 24–336)

## 2019-07-09 NOTE — Progress Notes (Signed)
Daily Session Note  Patient Details  Name: Cristian Martin MRN: [id number] Date of Birth: 1945-08-04 Referring Provider:     Cardiac Rehab from 07/04/2019 in [facility]  Referring Provider  [name], MD  [facility] (local cardiologist- Dr. Arida)]      Encounter Date: 07/09/2019  Check In: Session Check In - 07/09/19 [place]      Check-In   Supervising physician immediately available to respond to emergencies  See telemetry face sheet for immediately available ER MD    Location  [facility]-Cardiac & Pulmonary Rehab    Staff Present  Basilia Jumbo, RN, BSN;Joseph Hood RCP,RRT,BSRT;Kara Panther Burn, MS Exercise Physiologist;Jessica [place], [place], RCEP, CCRP, [place], BS, ACSM CEP, Exercise Physiologist;Melissa [place] RDN, LDN    Virtual Visit  No    Medication changes reported      No    Fall or balance concerns reported     No    Tobacco Cessation  No Change    Warm-up and Cool-down  Performed on first and last piece of equipment    Resistance Training Performed  Yes    VAD Patient?  No    PAD/SET Patient?  No      Pain Assessment   Currently in Pain?  No/denies          Social History   Tobacco Use  Smoking Status Never Smoker  Smokeless Tobacco Never Used    Goals Met:  Independence with exercise equipment Exercise tolerated well No report of cardiac concerns or symptoms  Goals Unmet:  Not Applicable  Comments: Pt able to follow exercise prescription today without complaint.  Will continue to monitor for progression.   Dr. Emily Filbert is Medical Director for [place] and [facility].

## 2019-07-10 ENCOUNTER — Telehealth: Payer: Self-pay

## 2019-07-10 MED ORDER — FUSION PLUS PO CAPS: 1.0000 | ORAL_CAPSULE | Freq: Every day | ORAL | 2 refills | Status: DC

## 2019-07-10 NOTE — Telephone Encounter (Signed)
-----   Message from Lin Landsman, MD sent at 07/09/2019  6:20 PM EDT ----- He has severe iron deficiency Recommend fusion  plus 1 pill daily or referral to hematology for IV iron based on patient's preference  Looks like he is already scheduled for upper endoscopy and colonoscopy, cardiac clearance is obtained from Dr. Fletcher Anon.  And, he can be switched to aspirin 81 mg daily from now.  Please let patient know  Thank youRohini Vanga

## 2019-07-10 NOTE — Telephone Encounter (Signed)
Patient verbalized understanding patient states he wants to call his [place] PCP to find out if they can prescribed the medication and get it cheaper through the [place]. Patient states he does not want to do IV infusions. Patient states he will let us know what his [place] doctor says. Patient verablized understanding about changing to Asprin 81mg  till his procedure and will restart Asprin 325mg  after procedure

## 2019-07-10 NOTE — Telephone Encounter (Signed)
Patient called back and would like medication sent to local pharmacy. Sent medication to pharmacy

## 2019-07-10 NOTE — Telephone Encounter (Signed)
Called and left a message for call back to go over results  

## 2019-07-10 NOTE — Addendum Note (Signed)
Addended by: Ulyess Blossom L on: 07/10/2019 10:59 AM   Modules accepted: Orders

## 2019-07-11 ENCOUNTER — Encounter: Payer: PPO | Admitting: *Deleted

## 2019-07-11 ENCOUNTER — Other Ambulatory Visit: Payer: Self-pay

## 2019-07-11 DIAGNOSIS — Z951 Presence of aortocoronary bypass graft: Secondary | ICD-10-CM

## 2019-07-11 LAB — GLUCOSE, CAPILLARY
Glucose-Capillary: 150 mg/dL — ABNORMAL HIGH (ref 70–99)
Glucose-Capillary: 104 mg/dL — ABNORMAL HIGH (ref 70–99)

## 2019-07-11 NOTE — Progress Notes (Signed)
Daily Session Note  Patient Details  Name: Cristian Martin MRN: [id number] Date of Birth: 1945-04-22 Referring Provider:     Cardiac Rehab from 07/04/2019 in [facility]  Referring Provider  [name], MD  [facility] (local cardiologist- Dr. Arida)]      Encounter Date: 07/11/2019  Check In: Session Check In - 07/11/19 1519      Check-In   Supervising physician immediately available to respond to emergencies  See telemetry face sheet for immediately available ER MD    Location  [facility]iac & [facility] Re[facility]    Staff Present  Heath Lark, RN, BSN, CCRP;Laureen Owens Shark, BS, RRT, CPFT;Melissa Caiola RDN, LDN    Virtual Visit  No    Medication changes reported      No    Fall or balance concerns reported     No    Warm-up and Cool-down  Performed on first and last piece of equipment    Resistance Training Performed  Yes    VAD Patient?  No    PAD/SET Patient?  No      Pain Assessment   Currently in Pain?  No/denies          Social History   Tobacco Use  Smoking Status Never Smoker  Smokeless Tobacco Never Used    Goals Met:  Independence with exercise equipment Exercise tolerated well No report of cardiac concerns or symptoms  Goals Unmet:  Not Applicable  Comments: Pt able to follow exercise prescription today without complaint.  Will continue to monitor for progression.    Dr. Emily Filbert is Medical Director for [place] and [facility].

## 2019-07-12 ENCOUNTER — Other Ambulatory Visit: Payer: Self-pay

## 2019-07-12 DIAGNOSIS — Z951 Presence of aortocoronary bypass graft: Secondary | ICD-10-CM | POA: Diagnosis not present

## 2019-07-12 LAB — GLUCOSE, CAPILLARY
Glucose-Capillary: 136 mg/dL — ABNORMAL HIGH (ref 70–99)
Glucose-Capillary: 119 mg/dL — ABNORMAL HIGH (ref 70–99)

## 2019-07-12 NOTE — Progress Notes (Signed)
Daily Session Note  Patient Details  Name: Cristian Martin MRN: [id number] Date of Birth: 01/15/1946 Referring Provider:     Cardiac Rehab from 07/04/2019 in [facility]  Referring Provider Gandhi, Sandhya, MD   [[facility] ([facility] Dr[facility] Arida)]      Encounter Date: 07/12/2019  Check In:  Session Check In - 07/12/19 1453      Check-In   Supervising physician immediately available to respond to emergencies See telemetry face sheet for immediately available ER MD    Location [facility]iac & [facility] Rehab    Staff Present Leslie Castrejon RN, BSN;Jessica [place], MA, RCEP, CCRP, CCET;Kara Langdon, MS Exercise Physiologist    Virtual Visit No    Medication changes reported     No    Fall or balance concerns reported    No    Warm-up and Cool-down Performed on first and last piece of equipment    Resistance Training Performed Yes    VAD Patient? No    PAD/SET Patient? No      Pain Assessment   Currently in Pain? No/denies              Social History   Tobacco Use  Smoking Status Never Smoker  Smokeless Tobacco Never Used    Goals Met:  Proper associated with RPD/PD & O2 Sat Independence with exercise equipment Exercise tolerated well No report of cardiac concerns or symptoms Strength training completed today  Goals Unmet:  Not Applicable  Comments: Pt able to follow exercise prescription today without complaint.  Will continue to monitor for progression.   Dr. Mark Miller is Medical Director for [facility] and [facility]. 

## 2019-07-18 ENCOUNTER — Other Ambulatory Visit: Payer: Self-pay

## 2019-07-18 ENCOUNTER — Encounter: Payer: Self-pay | Admitting: *Deleted

## 2019-07-18 ENCOUNTER — Encounter: Payer: PPO | Admitting: *Deleted

## 2019-07-18 DIAGNOSIS — Z951 Presence of aortocoronary bypass graft: Secondary | ICD-10-CM | POA: Diagnosis not present

## 2019-07-18 NOTE — Progress Notes (Signed)
Cardiac Individual Treatment Plan  Patient Details  Name: ANES RIGEL MRN: [id number] Date of Birth: Jun 03, 1945 Referring Provider:     Cardiac Rehab from 07/04/2019 in [facility] Cardiac and Pulmonary Rehab  Referring Provider [name], MD   [facility] (local cardiologist- Dr. Arida)]      Initial Encounter Date:    Cardiac Rehab from 07/04/2019 in [facility] Cardiac and Pulmonary Rehab  Date 07/04/19      Visit Diagnosis: S/P CABG x 3  Patient's Home Medications on Admission:  Current Outpatient Medications:  .  acetaminophen (TYLENOL) 325 MG tablet, Take 2 tablets (650 mg total) by mouth every 6 (six) hours as needed for mild pain., Disp:  , Rfl:  .  amLODipine (NORVASC) 10 MG tablet, Take 10 mg by mouth daily., Disp: , Rfl:  .  aspirin EC 325 MG EC tablet, Take 1 tablet (325 mg total) by mouth daily., Disp: 30 tablet, Rfl: 0 .  calcium carbonate (TUMS - DOSED IN MG ELEMENTAL CALCIUM) 500 MG chewable tablet, Chew 2-3 tablets by mouth daily as needed for indigestion or heartburn., Disp: , Rfl:  .  carvedilol (COREG) 6.25 MG tablet, Take 3.125 mg by mouth 2 (two) times daily with a meal., Disp: , Rfl:  .  cloNIDine (CATAPRES) 0.3 MG tablet, Take 1 tablet (0.3 mg total) by mouth 2 (two) times daily., Disp: , Rfl:  .  doxazosin (CARDURA) 4 MG tablet, Take 2 mg by mouth at bedtime. , Disp: , Rfl:  .  DULoxetine (CYMBALTA) 60 MG capsule, Take 60 mg by mouth daily., Disp: , Rfl:  .  famotidine (PEPCID) 20 MG tablet, Take 20 mg by mouth 2 (two) times daily., Disp: , Rfl:  .  furosemide (LASIX) 20 MG tablet, Take 1 tablet (20 mg total) by mouth daily. For one week then stop (Patient not taking: Reported on 07/05/2019), Disp: 7 tablet, Rfl: 0 .  guaiFENesin (MUCINEX) 600 MG 12 hr tablet, Take 1 tablet (600 mg total) by mouth 2 (two) times daily as needed for cough or to loosen phlegm., Disp: , Rfl:  .  insulin aspart protamine - aspart (NOVOLOG MIX 70/30 FLEXPEN) (70-30) 100 UNIT/ML FlexPen, Inject 0.2 mLs  (20 Units total) into the skin 2 (two) times daily before a meal. As the patient and I discussed, start with a lower dose of Insulin as he has not been eating well. Titrate Insulin upward to dose taken prior to surgery (60 units bid) as tolerates., Disp: 15 mL, Rfl: 11 .  Iron-FA-B Cmp-C-Biot-Probiotic (FUSION PLUS) CAPS, Take 1 tablet by mouth daily., Disp: 30 capsule, Rfl: 2 .  losartan (COZAAR) 50 MG tablet, Take 1 tablet (50 mg total) by mouth daily., Disp: 30 tablet, Rfl: 1 .  metFORMIN (GLUCOPHAGE-XR) 500 MG 24 hr tablet, Take 1,000 mg by mouth in the morning and at bedtime., Disp: , Rfl:  .  Multiple Vitamins-Minerals (MULTIVITAMIN ADULT PO), Take 1 tablet by mouth daily. , Disp: , Rfl:  .  omeprazole (PRILOSEC) 20 MG capsule, Take 20 mg by mouth 2 (two) times daily. , Disp: , Rfl:  .  potassium chloride (KLOR-CON) 10 MEQ tablet, Take 1 tablet (10 mEq total) by mouth daily. For one week then stop. (Patient not taking: Reported on 07/05/2019), Disp: 7 tablet, Rfl: 0 .  rosuvastatin (CRESTOR) 40 MG tablet, Take 1 tablet (40 mg total) by mouth daily., Disp: 30 tablet, Rfl: 1 .  Semaglutide (OZEMPIC, 1 MG/DOSE, [place]), Inject 1 mg into the skin every  Sunday. , Disp: , Rfl:  .  Trospium Chloride 60 MG CP24, Take 60 mg by mouth daily., Disp: , Rfl:   Past Medical History: Past Medical History:  Diagnosis Date  . Agent orange exposure   . Coronary artery disease    a. 04/2019 Cath: LM 99, LAD 80p, D2 70, LCX 50ost/p, RCA 70p, EF 55-65%; b. 04/2019 CABG x 3 (LIMA to LAD, SVG to OM1, SVG to RCA).  . Diabetic peripheral neuropathy ([place])   . Diastolic dysfunction    a 04/2019 Echo: EF 55-60%, mod conc LVH, Gr1 DD. Nl RV fxn. Mild Ao sclerosis w/o stenosis.  Marland Kitchen GERD (gastroesophageal reflux disease)   . Hyperlipidemia   . Hyperplasia of prostate with lower urinary tract symptoms (LUTS)   . Hypertension   . Prostate cancer [facility]) urologist-- dr ottelin/  oncologist-- dr Tammi Klippel   dx 02-21-2018  -- Stage  T1c, Gleason 3+4  . Type 2 diabetes mellitus treated with insulin ([place])    followed by [facility] in [place]  . Wears dentures    upper   . Wears glasses    "wears to help double vision"    Tobacco Use: Social History   Tobacco Use  Smoking Status Never Smoker  Smokeless Tobacco Never Used    Labs: Recent Review Flowsheet Data    Labs for ITP Cardiac and Pulmonary Rehab Latest Ref Rng & Units 04/30/2019 04/30/2019 04/30/2019 04/30/2019 04/30/2019   Cholestrol 0 - 200 mg/dL - - - - -   LDLCALC 0 - 99 mg/dL - - - - -   HDL >40 mg/dL - - - - -   Trlycerides <150 mg/dL - - - - -   Hemoglobin A1c 4.8 - 5.6 % - - - - -   PHART 7.35 - 7.45 - 7.327(L) 7.324(L) 7.348(L) 7.365   PCO2ART 32 - 48 mmHg - 40.6 42.5 37.9 38.3   HCO3 20.0 - 28.0 mmol/L - 21.5 22.3 20.9 21.9   TCO2 22 - 32 mmol/L 26 23 24 22 23    ACIDBASEDEF 0.0 - 2.0 mmol/L - 5.0(H) 4.0(H) 4.0(H) 3.0(H)   O2SAT % - 91.0 90.0 93.0 94.0       Exercise Target Goals: Exercise Program Goal: Individual exercise prescription set using results from initial 6 min walk test and THRR while considering  patient's activity barriers and safety.   Exercise Prescription Goal: Initial exercise prescription builds to 30-45 minutes a day of aerobic activity, 2-3 days per week.  Home exercise guidelines will be given to patient during program as part of exercise prescription that the participant will acknowledge.   Education: Aerobic Exercise & Resistance Training: - Gives group verbal and written instruction on the various components of exercise. Focuses on aerobic and resistive training programs and the benefits of this training and how to safely progress through these programs..   Education: Exercise & Equipment Safety: - Individual verbal instruction and demonstration of equipment use and safety with use of the equipment.   Cardiac Rehab from 06/28/2019 in [facility] Cardiac and Pulmonary Rehab  Date 06/28/19  Educator [facility]  Instruction Review  Code 1- Verbalizes Understanding      Education: Exercise Physiology & General Exercise Guidelines: - Group verbal and written instruction with models to review the exercise physiology of the cardiovascular system and associated critical values. Provides general exercise guidelines with specific guidelines to those with heart or lung disease.    Education: Flexibility, Balance, Mind/Body Relaxation: Provides group verbal/written instruction on the benefits  of flexibility and balance training, including mind/body exercise modes such as yoga, pilates and tai chi.  Demonstration and skill practice provided.   Activity Barriers & Risk Stratification:  Activity Barriers & Cardiac Risk Stratification - 07/04/19 1210      Activity Barriers & Cardiac Risk Stratification   Activity Barriers Deconditioning;Muscular Weakness;Shortness of Breath;Other (comment);Balance Concerns;Joint Problems    Comments Neuropathy, left shoulder bone spurs    Cardiac Risk Stratification High           6 Minute Walk:  6 Minute Walk    Row Name 07/04/19 1205         6 Minute Walk   Phase Initial     Distance 1024 feet     Walk Time 6 minutes     # of Rest Breaks 0     MPH 1.94     METS 2.54     RPE 13     VO2 Peak 8.91     Symptoms No     Resting HR 101 bpm     Resting BP 132/64     Resting Oxygen Saturation  97 %     Exercise Oxygen Saturation  during 6 min walk 96 %     Max Ex. HR 121 bpm     Max Ex. BP 158/70     2 Minute Post BP 126/72            Oxygen Initial Assessment:   Oxygen Re-Evaluation:   Oxygen Discharge (Final Oxygen Re-Evaluation):   Initial Exercise Prescription:  Initial Exercise Prescription - 07/04/19 1200      Date of Initial Exercise RX and Referring Provider   Date 07/04/19    Referring Provider [name], MD    [facility] (local cardiologist- Dr. Fletcher Anon)     Treadmill   MPH 1.8    Grade 0.5    Minutes 15    METs 2.5      REL-XR   Level 1    Speed 50     Minutes 15    METs 2.5      T5 Nustep   Level 1    SPM 80    Minutes 15    METs 2.5      Prescription Details   Frequency (times per week) 3    Duration Progress to 30 minutes of continuous aerobic without signs/symptoms of physical distress      Intensity   THRR 40-80% of Max Heartrate 119-137    Ratings of Perceived Exertion 11-13    Perceived Dyspnea 0-4      Progression   Progression Continue to progress workloads to maintain intensity without signs/symptoms of physical distress.      Resistance Training   Training Prescription Yes    Weight 4 lb    Reps 10-15           Perform Capillary Blood Glucose checks as needed.  Exercise Prescription Changes:  Exercise Prescription Changes    Row Name 07/04/19 1200 07/12/19 1400           Response to Exercise   Blood Pressure (Admit) 132/64 122/62      Blood Pressure (Exercise) 158/70 154/70      Blood Pressure (Exit) 126/72 130/80      Heart Rate (Admit) 101 bpm 122 bpm      Heart Rate (Exercise) 121 bpm 134 bpm      Heart Rate (Exit) 101 bpm 96 bpm      Oxygen  Saturation (Admit) 97 % --      Oxygen Saturation (Exercise) 96 % --      Rating of Perceived Exertion (Exercise) 13 15      Symptoms None none      Comments Walk Test Results second full day of exercise      Duration -- Progress to 30 minutes of  aerobic without signs/symptoms of physical distress      Intensity -- THRR unchanged        Progression   Progression -- Continue to progress workloads to maintain intensity without signs/symptoms of physical distress.      Average METs -- 2.37        Resistance Training   Training Prescription -- Yes      Weight -- 4 lb      Reps -- 10-15        Interval Training   Interval Training -- No        Treadmill   MPH -- 1.8      Grade -- 0.5      Minutes -- 15      METs -- 2.5        REL-XR   Level -- 1      Minutes -- 15      METs -- 2.7        T5 Nustep   Level -- 1      Minutes -- 15       METs -- 1.9             Exercise Comments:   Exercise Goals and Review:  Exercise Goals    [place] Name 07/04/19 1230             Exercise Goals   Increase Physical Activity Yes       Intervention Provide advice, education, support and counseling about physical activity/exercise needs.;Develop an individualized exercise prescription for aerobic and resistive training based on initial evaluation findings, risk stratification, comorbidities and participant's personal goals.       Expected Outcomes Short Term: Attend rehab on a regular basis to increase amount of physical activity.;Long Term: Exercising regularly at least 3-5 days a week.;Long Term: Add in home exercise to make exercise part of routine and to increase amount of physical activity.       Increase Strength and Stamina Yes       Intervention Provide advice, education, support and counseling about physical activity/exercise needs.;Develop an individualized exercise prescription for aerobic and resistive training based on initial evaluation findings, risk stratification, comorbidities and participant's personal goals.       Expected Outcomes Short Term: Increase workloads from initial exercise prescription for resistance, speed, and METs.;Short Term: Perform resistance training exercises routinely during rehab and add in resistance training at home;Long Term: Improve cardiorespiratory fitness, muscular endurance and strength as measured by increased METs and functional capacity (6MWT)       Able to understand and use rate of perceived exertion (RPE) scale Yes       Intervention Provide education and explanation on how to use RPE scale       Expected Outcomes Short Term: Able to use RPE daily in rehab to express subjective intensity level;Long Term:  Able to use RPE to guide intensity level when exercising independently       Able to understand and use Dyspnea scale Yes       Intervention Provide education and explanation on how to use  Dyspnea scale  Expected Outcomes Short Term: Able to use Dyspnea scale daily in rehab to express subjective sense of shortness of breath during exertion;Long Term: Able to use Dyspnea scale to guide intensity level when exercising independently       Knowledge and understanding of Target Heart Rate Range (THRR) Yes       Intervention Provide education and explanation of THRR including how the numbers were predicted and where they are located for reference       Expected Outcomes Short Term: Able to state/look up THRR;Long Term: Able to use THRR to govern intensity when exercising independently;Short Term: Able to use daily as guideline for intensity in rehab       Able to check pulse independently Yes       Intervention Provide education and demonstration on how to check pulse in carotid and radial arteries.;Review the importance of being able to check your own pulse for safety during independent exercise       Expected Outcomes Short Term: Able to explain why pulse checking is important during independent exercise;Long Term: Able to check pulse independently and accurately       Understanding of Exercise Prescription Yes       Intervention Provide education, explanation, and written materials on patient's individual exercise prescription       Expected Outcomes Short Term: Able to explain program exercise prescription;Long Term: Able to explain home exercise prescription to exercise independently              Exercise Goals Re-Evaluation :  Exercise Goals Re-Evaluation    Row Name 07/12/19 1441             Exercise Goal Re-Evaluation   Exercise Goals Review Increase Physical Activity;Increase Strength and Stamina;Understanding of Exercise Prescription       Comments Lesly Rubenstein is off to a good start in rehab.  He has completed his first two full days of exercise.  We will continue to montior his progress.       Expected Outcomes Short: Continue to attend rehab regularly Long: Continue to  follow program prescription              Discharge Exercise Prescription (Final Exercise Prescription Changes):  Exercise Prescription Changes - 07/12/19 1400      Response to Exercise   Blood Pressure (Admit) 122/62    Blood Pressure (Exercise) 154/70    Blood Pressure (Exit) 130/80    Heart Rate (Admit) 122 bpm    Heart Rate (Exercise) 134 bpm    Heart Rate (Exit) 96 bpm    Rating of Perceived Exertion (Exercise) 15    Symptoms none    Comments second full day of exercise    Duration Progress to 30 minutes of  aerobic without signs/symptoms of physical distress    Intensity THRR unchanged      Progression   Progression Continue to progress workloads to maintain intensity without signs/symptoms of physical distress.    Average METs 2.37      Resistance Training   Training Prescription Yes    Weight 4 lb    Reps 10-15      Interval Training   Interval Training No      Treadmill   MPH 1.8    Grade 0.5    Minutes 15    METs 2.5      REL-XR   Level 1    Minutes 15    METs 2.7      T5 Nustep   Level  1    Minutes 15    METs 1.9           Nutrition:  Target Goals: Understanding of nutrition guidelines, daily intake of sodium <156m, cholesterol <2022m calories 30% from fat and 7% or less from saturated fats, daily to have 5 or more servings of fruits and vegetables.  Education: Controlling Sodium/Reading Food Labels -Group verbal and written material supporting the discussion of sodium use in heart healthy nutrition. Review and explanation with models, verbal and written materials for utilization of the food label.   Education: General Nutrition Guidelines/Fats and Fiber: -Group instruction provided by verbal, written material, models and posters to present the general guidelines for heart healthy nutrition. Gives an explanation and review of dietary fats and fiber.   Biometrics:  Pre Biometrics - 07/04/19 1231      Pre Biometrics   Height 5' 9.9"  (1.775 m)    Weight 207 lb 3.2 oz (94 kg)    BMI (Calculated) 29.83    Single Leg Stand 1.9 seconds            Nutrition Therapy Plan and Nutrition Goals:   Nutrition Assessments:  Nutrition Assessments - 07/04/19 1313      MEDFICTS Scores   Pre Score 66           MEDIFICTS Score Key:          ?70 Need to make dietary changes          40-70 Heart Healthy Diet         ? 40 Therapeutic Level Cholesterol Diet  Nutrition Goals Re-Evaluation:   Nutrition Goals Discharge (Final Nutrition Goals Re-Evaluation):   Psychosocial: Target Goals: Acknowledge presence or absence of significant depression and/or stress, maximize coping skills, provide positive support system. Participant is able to verbalize types and ability to use techniques and skills needed for reducing stress and depression.   Education: Depression - Provides group verbal and written instruction on the correlation between heart/lung disease and depressed mood, treatment options, and the stigmas associated with seeking treatment.   Education: Sleep Hygiene -Provides group verbal and written instruction about how sleep can affect your health.  Define sleep hygiene, discuss sleep cycles and impact of sleep habits. Review good sleep hygiene tips.     Education: Stress and Anxiety: - Provides group verbal and written instruction about the health risks of elevated stress and causes of high stress.  Discuss the correlation between heart/lung disease and anxiety and treatment options. Review healthy ways to manage with stress and anxiety.    Initial Review & Psychosocial Screening:  Initial Psych Review & Screening - 06/28/19 1113      Initial Review   Current issues with Current Anxiety/Panic;Current Stress Concerns    Source of Stress Concerns Chronic Illness      Family Dynamics   Good Support System? Yes    Comments Short: Start [facility] to help with mood. Long: Maintain a healthy mental state.       Barriers   Psychosocial barriers to participate in program There are no identifiable barriers or psychosocial needs.;The patient should benefit from training in stress management and relaxation.      Screening Interventions   Interventions Provide feedback about the scores to participant;To provide support and resources with identified psychosocial needs;Encouraged to exercise    Expected Outcomes Short Term goal: Utilizing psychosocial counselor, staff and physician to assist with identification of specific Stressors or current issues interfering with healing process. Setting desired  goal for each stressor or current issue identified.;Long Term Goal: Stressors or current issues are controlled or eliminated.;Short Term goal: Identification and review with participant of any Quality of Life or Depression concerns found by scoring the questionnaire.;Long Term goal: The participant improves quality of Life and PHQ9 Scores as seen by post scores and/or verbalization of changes           Quality of Life Scores:   Quality of Life - 07/04/19 1310      Quality of Life   Select Quality of Life      Quality of Life Scores   Health/Function Pre 21.2 %    Socioeconomic Pre 24.88 %    Psych/Spiritual Pre 25.57 %    Family Pre 26.1 %    GLOBAL Pre 23.61 %          Scores of 19 and below usually indicate a poorer quality of life in these areas.  A difference of  2-3 points is a clinically meaningful difference.  A difference of 2-3 points in the total score of the Quality of Life Index has been associated with significant improvement in overall quality of life, self-image, physical symptoms, and general health in studies assessing change in quality of life.  PHQ-9: Recent Review Flowsheet Data    Depression screen [facility] 2/9 07/04/2019   Decreased Interest 2   Down, Depressed, Hopeless 0   PHQ - 2 Score 2   Altered sleeping 0   Tired, decreased energy 3   Change in appetite 2   Feeling bad or  failure about yourself  0   Trouble concentrating 1   Moving slowly or fidgety/restless 2   Suicidal thoughts 0   PHQ-9 Score 10   Difficult doing work/chores Somewhat difficult     Interpretation of Total Score  Total Score Depression Severity:  1-4 = Minimal depression, 5-9 = Mild depression, 10-14 = Moderate depression, 15-19 = Moderately severe depression, 20-27 = Severe depression   Psychosocial Evaluation and Intervention:  Psychosocial Evaluation - 06/28/19 1119      Psychosocial Evaluation & Interventions   Interventions Encouraged to exercise with the program and follow exercise prescription    Comments Short: Start [facility] to help with mood. Long: Maintain a healthy mental state.    Expected Outcomes Short: Start [facility] to help with mood. Long: Maintain a healthy mental state.    Continue Psychosocial Services  Follow up required by staff           Psychosocial Re-Evaluation:   Psychosocial Discharge (Final Psychosocial Re-Evaluation):   Vocational Rehabilitation: Provide vocational rehab assistance to qualifying candidates.   Vocational Rehab Evaluation & Intervention:   Education: Education Goals: Education classes will be provided on a variety of topics geared toward better understanding of heart health and risk factor modification. Participant will state understanding/return demonstration of topics presented as noted by education test scores.  Learning Barriers/Preferences:  Learning Barriers/Preferences - 06/28/19 1115      Learning Barriers/Preferences   Learning Barriers None    Learning Preferences None           General Cardiac Education Topics:  AED/CPR: - Group verbal and written instruction with the use of models to demonstrate the basic use of the AED with the basic ABC's of resuscitation.   Anatomy & Physiology of the Heart: - Group verbal and written instruction and models provide basic cardiac anatomy and physiology, with the  coronary electrical and arterial systems. Review of Valvular disease and Heart  Failure   Cardiac Procedures: - Group verbal and written instruction to review commonly prescribed medications for heart disease. Reviews the medication, class of the drug, and side effects. Includes the steps to properly store meds and maintain the prescription regimen. (beta blockers and nitrates)   Cardiac Medications I: - Group verbal and written instruction to review commonly prescribed medications for heart disease. Reviews the medication, class of the drug, and side effects. Includes the steps to properly store meds and maintain the prescription regimen.   Cardiac Medications II: -Group verbal and written instruction to review commonly prescribed medications for heart disease. Reviews the medication, class of the drug, and side effects. (all other drug classes)    Go Sex-Intimacy & Heart Disease, Get SMART - Goal Setting: - Group verbal and written instruction through game format to discuss heart disease and the return to sexual intimacy. Provides group verbal and written material to discuss and apply goal setting through the application of the S.M.A.R.T. Method.   Other Matters of the Heart: - Provides group verbal, written materials and models to describe Stable Angina and Peripheral Artery. Includes description of the disease process and treatment options available to the cardiac patient.   Infection Prevention: - Provides verbal and written material to individual with discussion of infection control including proper hand washing and proper equipment cleaning during exercise session.   Cardiac Rehab from 06/28/2019 in [facility] Cardiac and Pulmonary Rehab  Date 06/28/19  Educator [facility]  Instruction Review Code 1- Verbalizes Understanding      Falls Prevention: - Provides verbal and written material to individual with discussion of falls prevention and safety.   Cardiac Rehab from 06/28/2019 in [facility]  Cardiac and Pulmonary Rehab  Date 06/28/19  Educator [facility]  Instruction Review Code 1- Verbalizes Understanding      Other: -Provides group and verbal instruction on various topics (see comments)   Knowledge Questionnaire Score:  Knowledge Questionnaire Score - 07/04/19 1314      Knowledge Questionnaire Score   Pre Score 23/26- Exercise, PAD, Nutrition           Core Components/Risk Factors/Patient Goals at Admission:  Personal Goals and Risk Factors at Admission - 07/04/19 1315      Core Components/Risk Factors/Patient Goals on Admission    Weight Management Yes;Weight Loss    Intervention Weight Management: Develop a combined nutrition and exercise program designed to reach desired caloric intake, while maintaining appropriate intake of nutrient and fiber, sodium and fats, and appropriate energy expenditure required for the weight goal.;Weight Management: Provide education and appropriate resources to help participant work on and attain dietary goals.    Admit Weight 207 lb 3.2 oz (94 kg)    Goal Weight: Short Term 202 lb (91.6 kg)    Goal Weight: Long Term 197 lb (89.4 kg)    Expected Outcomes Short Term: Continue to assess and modify interventions until short term weight is achieved;Long Term: Adherence to nutrition and physical activity/exercise program aimed toward attainment of established weight goal;Understanding recommendations for meals to include 15-35% energy as protein, 25-35% energy from fat, 35-60% energy from carbohydrates, less than 245m of dietary cholesterol, 20-35 gm of total fiber daily;Understanding of distribution of calorie intake throughout the day with the consumption of 4-5 meals/snacks;Weight Loss: Understanding of general recommendations for a balanced deficit meal plan, which promotes 1-2 lb weight loss per week and includes a negative energy balance of [phone] kcal/d    Diabetes Yes    Intervention Provide education about signs/symptoms  and action to  take for hypo/hyperglycemia.;Provide education about proper nutrition, including hydration, and aerobic/resistive exercise prescription along with prescribed medications to achieve blood glucose in normal ranges: Fasting glucose 65-99 mg/dL    Expected Outcomes Short Term: Participant verbalizes understanding of the signs/symptoms and immediate care of hyper/hypoglycemia, proper foot care and importance of medication, aerobic/resistive exercise and nutrition plan for blood glucose control.;Long Term: Attainment of HbA1C < 7%.    Hypertension Yes    Intervention Provide education on lifestyle modifcations including regular physical activity/exercise, weight management, moderate sodium restriction and increased consumption of fresh fruit, vegetables, and low fat dairy, alcohol moderation, and smoking cessation.;Monitor prescription use compliance.    Expected Outcomes Long Term: Maintenance of blood pressure at goal levels.;Short Term: Continued assessment and intervention until BP is < 140/82m HG in hypertensive participants. < 130/873mHG in hypertensive participants with diabetes, heart failure or chronic kidney disease.    Lipids Yes    Intervention Provide education and support for participant on nutrition & aerobic/resistive exercise along with prescribed medications to achieve LDL <7055mHDL >80m7m  Expected Outcomes Short Term: Participant states understanding of desired cholesterol values and is compliant with medications prescribed. Participant is following exercise prescription and nutrition guidelines.;Long Term: Cholesterol controlled with medications as prescribed, with individualized exercise RX and with personalized nutrition plan. Value goals: LDL < 70mg52mL > 40 mg.           Education:Diabetes - Individual verbal and written instruction to review signs/symptoms of diabetes, desired ranges of glucose level fasting, after meals and with exercise. Acknowledge that pre and post  exercise glucose checks will be done for 3 sessions at entry of program.   [facility]  Date 06/28/19  Educator [facility] Code 1- Verbalizes Understanding      Education: Know Your Numbers and Risk Factors: -Group verbal and written instruction about important numbers in your health.  Discussion of what are risk factors and how they play a role in the disease process.  Review of Cholesterol, Blood Pressure, Diabetes, and BMI and the role they play in your overall health.   Core Components/Risk Factors/Patient Goals Review:    Core Components/Risk Factors/Patient Goals at Discharge (Final Review):    ITP Comments:  ITP Comments    Row Name 06/28/19 1125 07/04/19 1204 07/18/19 0616       ITP Comments Virtual Visit completed. Patient informed on EP and RD appointment and 6 Minute walk test. Patient also informed of patient health questionnaires on My Chart. Patient Verbalizes understanding. Visit diagnosis can be found [facility]. Completed 6MWT and gym orientation.  Initial ITP created and sent for review to Dr. Mark Emily Filbertical Director. 30 Day review completed. Medical Director ITP review done, changes made as directed, and signed approval by Medical Director.            Comments: 30 Day review completed. Medical Director ITP review done, changes made as directed, and signed approval by Medical Director.

## 2019-07-18 NOTE — Progress Notes (Signed)
Daily Session Note  Patient Details  Name: Cristian Martin MRN: [id number] Date of Birth: [birth year] Referring Provider:     Cardiac Rehab from 07/04/2019 in [facility] Cardiac and Pulmonary Rehab  Referring Provider [name], MD   [facility] (local cardiologist- Dr. Arida)]      Encounter Date: 07/18/2019  Check In:  Session Check In - 07/18/19 1515      Check-In   Supervising physician immediately available to respond to emergencies See telemetry face sheet for immediately available ER MD    Location [facility]-Cardiac & Pulmonary Rehab    Staff Present Renita Papa, RN BSN;Melissa Caiola RDN, Rowe Pavy, BA, ACSM CEP, Exercise Physiologist    Virtual Visit No    Medication changes reported     No    Fall or balance concerns reported    No    Warm-up and Cool-down Performed on first and last piece of equipment    Resistance Training Performed Yes    VAD Patient? No    PAD/SET Patient? No      Pain Assessment   Currently in Pain? No/denies              Social History   Tobacco Use  Smoking Status Never Smoker  Smokeless Tobacco Never Used    Goals Met:  Independence with exercise equipment Exercise tolerated well No report of cardiac concerns or symptoms Strength training completed today  Goals Unmet:  Not Applicable  Comments: Pt able to follow exercise prescription today without complaint.  Will continue to monitor for progression.    Dr. Emily Filbert is Medical Director for [place] and [facility].

## 2019-07-23 ENCOUNTER — Encounter: Payer: PPO | Admitting: *Deleted

## 2019-07-23 ENCOUNTER — Other Ambulatory Visit: Payer: Self-pay

## 2019-07-23 DIAGNOSIS — Z951 Presence of aortocoronary bypass graft: Secondary | ICD-10-CM

## 2019-07-23 NOTE — Progress Notes (Signed)
Daily Session Note  Patient Details  Name: Cristian Martin MRN: [id number] Date of Birth: December 16, 1945 Referring Provider:     Cardiac Rehab from 07/04/2019 in [facility] Cardiac and Pulmonary Rehab  Referring Provider [name], MD   [facility] (local cardiologist- Dr. Arida)]      Encounter Date: 07/23/2019  Check In:  Session Check In - 07/23/19 1510      Check-In   Supervising physician immediately available to respond to emergencies See telemetry face sheet for immediately available ER MD    Location [facility]-Cardiac [facility] Rehab    Staff Present Renita Papa, RN BSN;Joseph [address] [place], [place], ACSM CEP, Exercise Physiologist;Amanda Oletta Darter, [place], ACSM CEP, Exercise Physiologist    Virtual Visit No    Medication changes reported     No    Fall or balance concerns reported    No    Warm-up and Cool-down Performed on first and last piece of equipment    Resistance Training Performed Yes    VAD Patient? No    PAD/SET Patient? No      Pain Assessment   Currently in Pain? No/denies              Social History   Tobacco Use  Smoking Status Never Smoker  Smokeless Tobacco Never Used    Goals Met:  Independence with exercise equipment Exercise tolerated well Personal goals reviewed No report of cardiac concerns or symptoms Strength training completed today  Goals Unmet:  Not Applicable  Comments: Pt able to follow exercise prescription today without complaint.  Will continue to monitor for progression.    Dr. Emily Filbert is Medical Director for [place] and [facility].

## 2019-07-25 ENCOUNTER — Encounter: Payer: PPO | Admitting: *Deleted

## 2019-07-25 ENCOUNTER — Other Ambulatory Visit: Payer: Self-pay

## 2019-07-25 DIAGNOSIS — Z951 Presence of aortocoronary bypass graft: Secondary | ICD-10-CM | POA: Diagnosis not present

## 2019-07-25 NOTE — Progress Notes (Signed)
Daily Session Note  Patient Details  Name: Cristian Martin MRN: [id number] Date of Birth: 04-21-1945 Referring Provider:     Cardiac Rehab from 07/04/2019 in [facility] Cardiac and Pulmonary Rehab  Referring Provider [name], MD   [facility] (local cardiologist- Dr. Arida)]      Encounter Date: 07/25/2019  Check In:  Session Check In - 07/25/19 1456      Check-In   Supervising physician immediately available to respond to emergencies See telemetry face sheet for immediately available ER MD    Location [facility]-Cardiac & Pulmonary Rehab    Staff Present Renita Papa, RN BSN;Melissa Caiola RDN, Rowe Pavy, BA, ACSM CEP, Exercise Physiologist    Virtual Visit No    Medication changes reported     No    Fall or balance concerns reported    No    Warm-up and Cool-down Performed on first and last piece of equipment    Resistance Training Performed Yes    VAD Patient? No    PAD/SET Patient? No      Pain Assessment   Currently in Pain? No/denies              Social History   Tobacco Use  Smoking Status Never Smoker  Smokeless Tobacco Never Used    Goals Met:  Independence with exercise equipment Exercise tolerated well No report of cardiac concerns or symptoms Strength training completed today  Goals Unmet:  Not Applicable  Comments: Pt able to follow exercise prescription today without complaint.  Will continue to monitor for progression.    Dr. Emily Filbert is Medical Director for [place] and [facility].

## 2019-07-26 ENCOUNTER — Encounter: Payer: PPO | Admitting: *Deleted

## 2019-07-26 ENCOUNTER — Other Ambulatory Visit: Payer: Self-pay

## 2019-07-26 DIAGNOSIS — Z951 Presence of aortocoronary bypass graft: Secondary | ICD-10-CM

## 2019-07-26 NOTE — Progress Notes (Signed)
Cristian Session Note  Patient Details  Name: Cristian Martin MRN: [id number] Date of Birth: 1945-09-03 Referring Provider:     Cardiac Rehab from 07/04/2019 in [facility] Cardiac and Pulmonary Rehab  Referring Provider [name], MD   [facility] (local cardiologist- Dr. Arida)]      Encounter Date: 07/26/2019  Check In:  Session Check In - 07/26/19 1506      Check-In   Supervising physician immediately available to respond to emergencies See telemetry face sheet for immediately available ER MD    Location [facility]-Cardiac & Pulmonary Rehab    Staff Present Renita Papa, RN BSN;Joseph [address] [place], [place], RCEP, CCRP, CCET    Virtual Visit No    Medication changes reported     No    Fall or balance concerns reported    No    Warm-up and Cool-down Performed on first and last piece of equipment    Resistance Training Performed Yes    VAD Patient? No    PAD/SET Patient? No      Pain Assessment   Currently in Pain? No/denies              Social History   Tobacco Use  Smoking Status Never Smoker  Smokeless Tobacco Never Used    Goals Met:  Independence with exercise equipment Exercise tolerated well No report of cardiac concerns or symptoms Strength training completed today  Goals Unmet:  Not Applicable  Comments: Pt able to follow exercise prescription today without complaint.  Will continue to monitor for progression.    Dr. Emily Filbert is Medical Director for [place] and [facility].

## 2019-07-27 ENCOUNTER — Ambulatory Visit (INDEPENDENT_AMBULATORY_CARE_PROVIDER_SITE_OTHER): Payer: PPO | Admitting: Nurse Practitioner

## 2019-07-27 ENCOUNTER — Encounter: Payer: Self-pay | Admitting: Nurse Practitioner

## 2019-07-27 VITALS — BP 130/86 | HR 89 | Ht 71.0 in | Wt 206.2 lb

## 2019-07-27 DIAGNOSIS — E785 Hyperlipidemia, unspecified: Secondary | ICD-10-CM

## 2019-07-27 DIAGNOSIS — I2510 Atherosclerotic heart disease of native coronary artery without angina pectoris: Secondary | ICD-10-CM | POA: Diagnosis not present

## 2019-07-27 DIAGNOSIS — I10 Essential (primary) hypertension: Secondary | ICD-10-CM | POA: Diagnosis not present

## 2019-07-27 DIAGNOSIS — Z01810 Encounter for preprocedural cardiovascular examination: Secondary | ICD-10-CM | POA: Diagnosis not present

## 2019-07-27 MED ORDER — ASPIRIN EC 81 MG PO TBEC
81.0000 mg | DELAYED_RELEASE_TABLET | Freq: Every day | ORAL | 11 refills | Status: AC
Start: 2019-07-27 — End: ?

## 2019-07-27 NOTE — Patient Instructions (Signed)
Medication Instructions:  1- STOP Lasix and K Cl 2- Reduce Aspirin to 81 mg total once daily.  *If you need a refill on your cardiac medications before your next appointment, please call your pharmacy*   Lab Work: None ordered  If you have labs (blood work) drawn today and your tests are completely normal, you will receive your results only by: Marland Kitchen MyChart Message (if you have MyChart) OR . A paper copy in the mail If you have any lab test that is abnormal or we need to change your treatment, we will call you to review the results.   Testing/Procedures: None ordered    Follow-Up: At [facility], you and your health needs are our priority.  As part of our continuing mission to provide you with exceptional heart care, we have created designated Provider Care Teams.  These Care Teams include your primary Cardiologist (physician) and Advanced Practice Providers (APPs -  Physician Assistants and Nurse Practitioners) who all work together to provide you with the care you need, when you need it.  We recommend signing up for the patient portal called "MyChart".  Sign up information is provided on this After Visit Summary.  MyChart is used to connect with patients for Virtual Visits (Telemedicine).  Patients are able to view lab/test results, encounter notes, upcoming appointments, etc.  Non-urgent messages can be sent to your provider as well.   To learn more about what you can do with MyChart, go to [email or web address].    Your next appointment:   6 month(s)  The format for your next appointment:   In Person  Provider:    You may see [name], MD or Murray Hodgkins, NP

## 2019-07-27 NOTE — Progress Notes (Signed)
Office Visit    Patient Name: Cristian Martin Date of Encounter: 07/27/2019  Primary Care Provider:  [facility] Primary Cardiologist:  [name], MD  Chief Complaint    74 year old male with a history of CAD status post CABG hypertension, hyperlipidemia, diabetes, diabetic peripheral neuropathy, and prostate cancer, who presents for preoperative evaluation pending EGD and colonoscopy in the setting of microcytic anemia.  Past Medical History    Past Medical History:  Diagnosis Date  . Agent orange exposure   . Coronary artery disease    a. 04/2019 Cath: LM 99, LAD 80p, D2 70, LCX 50ost/p, RCA 70p, EF 55-65%; b. 04/2019 CABG x 3 (LIMA to LAD, SVG to OM1, SVG to RCA).  . Diabetic peripheral neuropathy ([place])   . Diastolic dysfunction    a 04/2019 Echo: EF 55-60%, mod conc LVH, Gr1 DD. Nl RV fxn. Mild Ao sclerosis w/o stenosis.  Marland Kitchen GERD (gastroesophageal reflux disease)   . Hyperlipidemia   . Hyperplasia of prostate with lower urinary tract symptoms (LUTS)   . Hypertension   . Prostate cancer [facility]) urologist-- dr ottelin/  oncologist-- dr Tammi Klippel   dx 02-21-2018  -- Stage T1c, Gleason 3+4  . Type 2 diabetes mellitus treated with insulin ([place])    followed by [place] in [place]  . Wears dentures    upper   . Wears glasses    "wears to help double vision"   Past Surgical History:  Procedure Laterality Date  . CORONARY ARTERY BYPASS GRAFT N/A 04/30/2019   Procedure: CORONARY ARTERY BYPASS GRAFTING (CABG) TIMES THREE USING LEFT INTERNAL MAMMARY AND RIGHT GREATER SAPHENOUS VEIN HARVESTED ENDOSCOPICALLY. MAMMARY ARTERY TO LAD, SAPHENOUS VEIN GRAFT TO  OM1, SAPHENOUS VEING GRAFT TO RIGHT. BIOPSY OF MEDIASTINAL TISSUE PLACEMENT OF RIGHT FEMORAL A-LINE;  Surgeon: Grace Isaac, MD;  Location: [place];  Service: Open Heart Surgery;  Laterality: N/A;  . CYSTOSCOPY N/A 07/14/2018   Procedure: CYSTOSCOPY;  Surgeon: Kathie Rhodes, MD;  Location: [facility];   Service: Urology;  Laterality: N/A;  no seeds in bladder per Dr Karsten Ro  . LEFT HEART CATH AND CORONARY ANGIOGRAPHY N/A 04/27/2019   Procedure: LEFT HEART CATH AND CORONARY ANGIOGRAPHY;  Surgeon: Wellington Hampshire, MD;  Location: [place] CV [facility];  Service: Cardiovascular;  Laterality: N/A;  . PROSTATE BIOPSY  02-21-2018  dr Karsten Ro in office  . RADIOACTIVE SEED IMPLANT N/A 07/14/2018   Procedure: RADIOACTIVE SEED IMPLANT/BRACHYTHERAPY IMPLANT;  Surgeon: Kathie Rhodes, MD;  Location: [facility];  Service: Urology;  Laterality: N/A;  77 seeds   . SHOULDER ARTHROSCOPY Left 2018   in [place]   "remove spur"  . SPACE OAR INSTILLATION N/A 07/14/2018   Procedure: SPACE OAR INSTILLATION;  Surgeon: Kathie Rhodes, MD;  Location: [facility];  Service: Urology;  Laterality: N/A;  . TEE WITHOUT CARDIOVERSION N/A 04/30/2019   Procedure: TRANSESOPHAGEAL ECHOCARDIOGRAM (TEE);  Surgeon: Grace Isaac, MD;  Location: [place];  Service: Open Heart Surgery;  Laterality: N/A;    Allergies  Allergies  Allergen Reactions  . Ace Inhibitors Other (See Comments)    Hyperkalemia.    History of Present Illness    74 year old male with the above past medical history including hypertension, hyperlipidemia, diabetes, diabetic peripheral neuropathy, and prostate cancer.  In late March 2021, he was seen in cardiology clinic with complaints of unstable angina.  He subsequently underwent diagnostic catheterization revealing severe multivessel disease including a 99% left main stenosis, 80%  proximal LAD stenosis, and 70% proximal RCA stenosis.  He underwent CABG x3 at [facility] on March 29.  At follow-up April 21, he had noted some improvement in exercise tolerance.  In the setting of ongoing microcytic anemia, he was seen by GI on June 3 with plan for EGD and colonoscopy.  Since his last visit, he has felt well.  He cont to to participate in cardiac rehab and notes significant strides.   He denies chest pain, dyspnea, palpitations, PND, orthopnea, dizziness, syncope, edema, or early satiety.  Home Medications    Prior to Admission medications   Medication Sig Start Date End Date Taking? Authorizing Provider  acetaminophen (TYLENOL) 325 MG tablet Take 2 tablets (650 mg total) by mouth every 6 (six) hours as needed for mild pain. 05/03/19   Nani Skillern, PA-C  amLODipine (NORVASC) 10 MG tablet Take 10 mg by mouth daily.    [provider]  aspirin EC 81 MG EC tablet Take 1 tablet (81 mg total) by mouth daily. 05/04/19   Nani Skillern, PA-C  calcium carbonate (TUMS - DOSED IN MG ELEMENTAL CALCIUM) 500 MG chewable tablet Chew 2-3 tablets by mouth daily as needed for indigestion or heartburn.    [provider]  carvedilol (COREG) 6.25 MG tablet Take 3.125 mg by mouth 2 (two) times daily with a meal.    [provider]  cloNIDine (CATAPRES) 0.3 MG tablet Take 1 tablet (0.3 mg total) by mouth 2 (two) times daily. 05/04/19   Nani Skillern, PA-C  doxazosin (CARDURA) 4 MG tablet Take 2 mg by mouth at bedtime.     [provider]  DULoxetine (CYMBALTA) 60 MG capsule Take 60 mg by mouth daily.    [provider]  famotidine (PEPCID) 20 MG tablet Take 20 mg by mouth 2 (two) times daily.    [provider]  furosemide (LASIX) 20 MG tablet Take 1 tablet (20 mg total) by mouth daily. For one week then stop Patient not taking: Reported on 07/05/2019 05/04/19   Nani Skillern, PA-C  guaiFENesin (MUCINEX) 600 MG 12 hr tablet Take 1 tablet (600 mg total) by mouth 2 (two) times daily as needed for cough or to loosen phlegm. 05/03/19   Nani Skillern, PA-C  insulin aspart protamine - aspart (NOVOLOG MIX 70/30 FLEXPEN) (70-30) 100 UNIT/ML FlexPen Inject 0.2 mLs (20 Units total) into the skin 2 (two) times daily before a meal. As the patient and I discussed, start with a lower dose of Insulin as he has not been eating well.  Titrate Insulin upward to dose taken prior to surgery (60 units bid) as tolerates. 05/04/19   Lars Pinks M, PA-C  Iron-FA-B Cmp-C-Biot-Probiotic (FUSION PLUS) CAPS Take 1 tablet by mouth daily. 07/10/19   Lin Landsman, MD  losartan (COZAAR) 50 MG tablet Take 1 tablet (50 mg total) by mouth daily. 05/04/19   Nani Skillern, PA-C  metFORMIN (GLUCOPHAGE-XR) 500 MG 24 hr tablet Take 1,000 mg by mouth in the morning and at bedtime.    [provider]  Multiple Vitamins-Minerals (MULTIVITAMIN ADULT PO) Take 1 tablet by mouth daily.     [provider]  omeprazole (PRILOSEC) 20 MG capsule Take 20 mg by mouth 2 (two) times daily.     [provider]  potassium chloride (KLOR-CON) 10 MEQ tablet Take 1 tablet (10 mEq total) by mouth daily. For one week then stop. Patient not taking: Reported on 07/05/2019 05/04/19  Lars Pinks M, PA-C  rosuvastatin (CRESTOR) 40 MG tablet Take 1 tablet (40 mg total) by mouth daily. 05/04/19   Lars Pinks M, PA-C  Semaglutide (OZEMPIC, 1 MG/DOSE, [place]) Inject 1 mg into the skin every Sunday.     [provider]  Trospium Chloride 60 MG CP24 Take 60 mg by mouth daily.    [provider]    Review of Systems    He denies chest pain, palpitations, dyspnea, pnd, orthopnea, n, v, dizziness, syncope, edema, weight gain, or early satiety.  All other systems reviewed and are otherwise negative except as noted above.  Physical Exam    VS:  BP 130/86 (BP Location: Left Arm, Patient Position: Sitting, Cuff Size: Normal)   Pulse 89   Ht 5\' 11"  (1.803 m)   Wt 206 lb 4 oz (93.6 kg)   SpO2 95%   BMI 28.77 kg/m  , BMI Body mass index is 28.77 kg/m. GEN: Well nourished, well developed, in no acute distress. HEENT: normal. Neck: Supple, no JVD, carotid bruits, or masses. Cardiac: RRR, no murmurs, rubs, or gallops. No clubbing, cyanosis, edema.  Radials/PT 2+ and equal bilaterally.  Respiratory:  Respirations  regular and unlabored, clear to auscultation bilaterally. GI: Soft, nontender, nondistended, BS + x 4. MS: no deformity or atrophy. Skin: warm and dry, no rash. Neuro:  Strength and sensation are intact. Psych: Normal affect.  Accessory Clinical Findings    ECG personally reviewed by me today -regular sinus rhythm, 89- no acute changes.  Lab Results  Component Value Date   WBC 7.8 05/23/2019   HGB 11.0 (L) 05/23/2019   HCT 34.8 (L) 05/23/2019   MCV 84 05/23/2019   PLT 397 05/23/2019   Lab Results  Component Value Date   CREATININE 0.85 05/23/2019   BUN 22 05/23/2019   NA 140 05/23/2019   K 4.9 05/23/2019   CL 101 05/23/2019   CO2 21 05/23/2019   Lab Results  Component Value Date   ALT 33 04/28/2019   AST 27 04/28/2019   ALKPHOS 61 04/28/2019   BILITOT 0.8 04/28/2019   Lab Results  Component Value Date   CHOL 93 04/28/2019   HDL 37 (L) 04/28/2019   LDLCALC 23 04/28/2019   TRIG 167 (H) 04/28/2019   CHOLHDL 2.5 04/28/2019    Lab Results  Component Value Date   HGBA1C 7.2 (H) 04/28/2019    Assessment & Plan    1.  Coronary artery disease/preprocedural cardiovascular examination: Status post three-vessel bypass in March of this year.  He has been doing well postoperatively and making good strides in cardiac rehab.  He has not been experiencing any chest pain or dyspnea.  He remains on aspirin, beta-blocker, ARB, and high potency statin therapy.  He is pending EGD and colonoscopy in July and in the setting of recent revascularization without symptoms, he will not require further ischemic evaluation.  We do recommend continuation of his cardiac medications throughout the perioperative period, including low-dose aspirin therapy.  2.  Essential hypertension: Blood pressure stable today.  Continue multiple agents including beta-blocker, ARB, amlodipine, clonidine, and doxazosin.  3.  Hyperlipidemia: LDL of 23 in March.  Normal LFTs at that time.  He remains on high potency  statin therapy.  4.  Type 2 diabetes mellitus: This is followed closely at the [place] and he notes that he did not require any adjustment in medications at most recent visit.  5.  Microcytic anemia: Due for EGD and colonoscopy as above.  6.  Disposition: He continues to progress well.  Follow-up in clinic in 4 to 6 months or sooner if necessary.   Cristian Hodgkins, NP 07/27/2019, 5:50 PM

## 2019-07-30 ENCOUNTER — Other Ambulatory Visit: Payer: Self-pay

## 2019-07-30 ENCOUNTER — Encounter: Payer: PPO | Admitting: *Deleted

## 2019-07-30 DIAGNOSIS — Z951 Presence of aortocoronary bypass graft: Secondary | ICD-10-CM | POA: Diagnosis not present

## 2019-07-30 NOTE — Progress Notes (Signed)
Daily Session Note  Patient Details  Name: Cristian Martin MRN: [id number] Date of Birth: [birth year] Referring Provider:     Cardiac Rehab from 07/04/2019 in [facility] Cardiac and Pulmonary Rehab  Referring Provider [name], MD   [facility] (local cardiologist- Dr. Arida)]      Encounter Date: 07/30/2019  Check In:  Session Check In - 07/30/19 1502      Check-In   Supervising physician immediately available to respond to emergencies See telemetry face sheet for immediately available ER MD    Location [facility]-Cardiac & Pulmonary Rehab    Staff Present Renita Papa, RN BSN;Joseph Lou Miner, [place] Exercise Physiologist;Amanda Oletta Darter, [place], ACSM CEP, Exercise Physiologist    Virtual Visit No    Medication changes reported     No    Fall or balance concerns reported    No    Warm-up and Cool-down Performed on first and last piece of equipment    Resistance Training Performed Yes    VAD Patient? No    PAD/SET Patient? No      Pain Assessment   Currently in Pain? No/denies              Social History   Tobacco Use  Smoking Status Never Smoker  Smokeless Tobacco Never Used    Goals Met:  Independence with exercise equipment Exercise tolerated well No report of cardiac concerns or symptoms Strength training completed today  Goals Unmet:  Not Applicable  Comments: Pt able to follow exercise prescription today without complaint.  Will continue to monitor for progression.    Dr. Emily Filbert is Medical Director for [place] and [facility].

## 2019-08-02 ENCOUNTER — Encounter: Payer: No Typology Code available for payment source | Attending: Internal Medicine | Admitting: *Deleted

## 2019-08-02 ENCOUNTER — Other Ambulatory Visit: Payer: Self-pay

## 2019-08-02 DIAGNOSIS — K219 Gastro-esophageal reflux disease without esophagitis: Secondary | ICD-10-CM | POA: Insufficient documentation

## 2019-08-02 DIAGNOSIS — I2510 Atherosclerotic heart disease of native coronary artery without angina pectoris: Secondary | ICD-10-CM | POA: Diagnosis not present

## 2019-08-02 DIAGNOSIS — Z8546 Personal history of malignant neoplasm of prostate: Secondary | ICD-10-CM | POA: Diagnosis not present

## 2019-08-02 DIAGNOSIS — I10 Essential (primary) hypertension: Secondary | ICD-10-CM | POA: Insufficient documentation

## 2019-08-02 DIAGNOSIS — E1142 Type 2 diabetes mellitus with diabetic polyneuropathy: Secondary | ICD-10-CM | POA: Diagnosis not present

## 2019-08-02 DIAGNOSIS — Z951 Presence of aortocoronary bypass graft: Secondary | ICD-10-CM | POA: Insufficient documentation

## 2019-08-02 DIAGNOSIS — Z7982 Long term (current) use of aspirin: Secondary | ICD-10-CM | POA: Insufficient documentation

## 2019-08-02 DIAGNOSIS — E785 Hyperlipidemia, unspecified: Secondary | ICD-10-CM | POA: Insufficient documentation

## 2019-08-02 DIAGNOSIS — Z794 Long term (current) use of insulin: Secondary | ICD-10-CM | POA: Insufficient documentation

## 2019-08-02 DIAGNOSIS — Z79899 Other long term (current) drug therapy: Secondary | ICD-10-CM | POA: Diagnosis not present

## 2019-08-02 NOTE — Progress Notes (Signed)
Daily Session Note  Patient Details  Name: Cristian Martin MRN: [id number] Date of Birth: 06/26/1945 Referring Provider:     Cardiac Rehab from 07/04/2019 in [facility] Cardiac and Pulmonary Rehab  Referring Provider [name], MD   [facility] (local cardiologist- Dr. Arida)]      Encounter Date: 08/02/2019  Check In:  Session Check In - 08/02/19 1505      Check-In   Supervising physician immediately available to respond to emergencies See telemetry face sheet for immediately available ER MD    Location [facility]-Cardiac & Pulmonary Rehab    Staff Present Renita Papa, RN BSN;Joseph Darrin Nipper, [place], RCEP, CCRP, CCET    Virtual Visit No    Medication changes reported     No    Warm-up and Cool-down Performed on first and last piece of equipment    Resistance Training Performed Yes    VAD Patient? No    PAD/SET Patient? No      Pain Assessment   Currently in Pain? No/denies              Social History   Tobacco Use  Smoking Status Never Smoker  Smokeless Tobacco Never Used    Goals Met:  Independence with exercise equipment Exercise tolerated well No report of cardiac concerns or symptoms Strength training completed today  Goals Unmet:  Not Applicable  Comments: Pt able to follow exercise prescription today without complaint.  Will continue to monitor for progression.    Dr. Emily Filbert is Medical Director for [place] and [facility].

## 2019-08-08 ENCOUNTER — Other Ambulatory Visit: Payer: Self-pay

## 2019-08-08 ENCOUNTER — Encounter: Payer: No Typology Code available for payment source | Admitting: *Deleted

## 2019-08-08 DIAGNOSIS — Z951 Presence of aortocoronary bypass graft: Secondary | ICD-10-CM

## 2019-08-08 NOTE — Progress Notes (Signed)
Daily Session Note  Patient Details  Name: Cristian Martin MRN: [id number] Date of Birth: [birth year] Referring Provider:     Cardiac Rehab from 07/04/2019 in [facility] Cardiac and Pulmonary Rehab  Referring Provider [name], MD   [facility] (local cardiologist- Dr. Arida)]      Encounter Date: 08/08/2019  Check In:  Session Check In - 08/08/19 1541      Check-In   Supervising physician immediately available to respond to emergencies See telemetry face sheet for immediately available ER MD    Location [facility]-Cardiac & Pulmonary Rehab    Staff Present Renita Papa, RN BSN;Joseph Lou Miner, [place] Exercise Physiologist    Virtual Visit No    Medication changes reported     No    Fall or balance concerns reported    No    Warm-up and Cool-down Performed on first and last piece of equipment    Resistance Training Performed Yes    VAD Patient? No    PAD/SET Patient? No      Pain Assessment   Currently in Pain? No/denies              Social History   Tobacco Use  Smoking Status Never Smoker  Smokeless Tobacco Never Used    Goals Met:  Independence with exercise equipment Exercise tolerated well No report of cardiac concerns or symptoms Strength training completed today  Goals Unmet:  Not Applicable  Comments: Pt able to follow exercise prescription today without complaint.  Will continue to monitor for progression.    Dr. Emily Filbert is Medical Director for [place] and [facility].

## 2019-08-09 ENCOUNTER — Encounter: Payer: No Typology Code available for payment source | Admitting: *Deleted

## 2019-08-09 ENCOUNTER — Other Ambulatory Visit: Payer: Self-pay

## 2019-08-09 DIAGNOSIS — Z951 Presence of aortocoronary bypass graft: Secondary | ICD-10-CM | POA: Diagnosis not present

## 2019-08-09 NOTE — Progress Notes (Signed)
Daily Session Note  Patient Details  Name: Cristian Martin MRN: [id number] Date of Birth: 01/29/1946 Referring Provider:     Cardiac Rehab from 07/04/2019 in [facility] Cardiac and Pulmonary Rehab  Referring Provider Gandhi, Sandhya, MD   [[facility] ([place]- Dr. Arida)]      Encounter Date: 08/09/2019  Check In:  Session Check In - 08/09/19 1527      Check-In   Supervising physician immediately available to respond to emergencies See telemetry face sheet for immediately available ER MD    Location [facility]-Cardiac & Pulmonary Rehab    Staff Present Meredith Craven, RN BSN;Joseph Hood RCP,RRT,BSRT;Jessica Hawkins, MA, RCEP, CCRP, CCET    Virtual Visit No    Medication changes reported     No    Fall or balance concerns reported    No    Warm-up and Cool-down Performed on first and last piece of equipment    Resistance Training Performed Yes    VAD Patient? No    PAD/SET Patient? No      Pain Assessment   Currently in Pain? No/denies              Social History   Tobacco Use  Smoking Status Never Smoker  Smokeless Tobacco Never Used    Goals Met:  Independence with exercise equipment Exercise tolerated well No report of cardiac concerns or symptoms Strength training completed today  Goals Unmet:  Not Applicable  Comments: Pt able to follow exercise prescription today without complaint.  Will continue to monitor for progression.    Dr. Mark Miller is Medical Director for [facility] and [facility]. 

## 2019-08-13 ENCOUNTER — Other Ambulatory Visit: Payer: Self-pay

## 2019-08-13 ENCOUNTER — Encounter: Payer: No Typology Code available for payment source | Admitting: *Deleted

## 2019-08-13 DIAGNOSIS — Z951 Presence of aortocoronary bypass graft: Secondary | ICD-10-CM

## 2019-08-13 NOTE — Progress Notes (Signed)
Daily Session Note  Patient Details  Name: Cristian Martin MRN: [id number] Date of Birth: [birth year] Referring Provider:     Cardiac Rehab from 07/04/2019 in [facility] Cardiac and Pulmonary Rehab  Referring Provider [name], MD   [facility] (local cardiologist- Dr. Arida)]      Encounter Date: 08/13/2019  Check In:  Session Check In - 08/13/19 1545      Check-In   Supervising physician immediately available to respond to emergencies See telemetry face sheet for immediately available ER MD    Location [facility]-Cardiac & Pulmonary Rehab    Staff Present Renita Papa, RN Margurite Auerbach, MS Exercise Physiologist;Kelly Amedeo Plenty, BS, ACSM CEP, Exercise Physiologist    Virtual Visit No    Medication changes reported     No    Fall or balance concerns reported    No    Warm-up and Cool-down Performed on first and last piece of equipment    Resistance Training Performed Yes    VAD Patient? No    PAD/SET Patient? No      Pain Assessment   Currently in Pain? No/denies              Social History   Tobacco Use  Smoking Status Never Smoker  Smokeless Tobacco Never Used    Goals Met:  Independence with exercise equipment Exercise tolerated well No report of cardiac concerns or symptoms Strength training completed today  Goals Unmet:  Not Applicable  Comments: Pt able to follow exercise prescription today without complaint.  Will continue to monitor for progression.    Dr. Emily Filbert is Medical Director for [place] and [facility].

## 2019-08-14 ENCOUNTER — Other Ambulatory Visit
Admission: RE | Admit: 2019-08-14 | Discharge: 2019-08-14 | Disposition: A | Discharge status: Home / Self Care | Payer: PPO | Source: Ambulatory Visit | Attending: Gastroenterology | Admitting: Gastroenterology

## 2019-08-14 DIAGNOSIS — Z01812 Encounter for preprocedural laboratory examination: Secondary | ICD-10-CM | POA: Insufficient documentation

## 2019-08-14 DIAGNOSIS — Z20822 Contact with and (suspected) exposure to covid-19: Secondary | ICD-10-CM | POA: Insufficient documentation

## 2019-08-14 LAB — SARS CORONAVIRUS 2 (TAT 6-24 HRS): SARS Coronavirus 2: NEGATIVE

## 2019-08-15 ENCOUNTER — Encounter: Payer: Self-pay | Admitting: *Deleted

## 2019-08-15 DIAGNOSIS — Z951 Presence of aortocoronary bypass graft: Secondary | ICD-10-CM

## 2019-08-15 NOTE — Progress Notes (Signed)
Cardiac Individual Treatment Plan  Patient Details  Name: Cristian Martin MRN: [id number] Date of Birth: [birth year] Referring Provider:     Cardiac Rehab from 07/04/2019 in [facility] Cardiac and Pulmonary Rehab  Referring Provider [name], MD   [facility] (local cardiologist- Dr. Arida)]      Initial Encounter Date:    Cardiac Rehab from 07/04/2019 in [facility] Cardiac and Pulmonary Rehab  Date 07/04/19      Visit Diagnosis: S/P CABG x 3  Patient's Home Medications on Admission:  Current Outpatient Medications:  .  acetaminophen (TYLENOL) 325 MG tablet, Take 2 tablets (650 mg total) by mouth every 6 (six) hours as needed for mild pain., Disp:  , Rfl:  .  amLODipine (NORVASC) 10 MG tablet, Take 10 mg by mouth daily., Disp: , Rfl:  .  aspirin EC 81 MG tablet, Take 1 tablet (81 mg total) by mouth daily. Swallow whole., Disp: 30 tablet, Rfl: 11 .  calcium carbonate (TUMS - DOSED IN MG ELEMENTAL CALCIUM) 500 MG chewable tablet, Chew 2-3 tablets by mouth daily as needed for indigestion or heartburn., Disp: , Rfl:  .  carvedilol (COREG) 6.25 MG tablet, Take 3.125 mg by mouth 2 (two) times daily with a meal., Disp: , Rfl:  .  cloNIDine (CATAPRES) 0.3 MG tablet, Take 1 tablet (0.3 mg total) by mouth 2 (two) times daily., Disp: , Rfl:  .  doxazosin (CARDURA) 4 MG tablet, Take 2 mg by mouth at bedtime. , Disp: , Rfl:  .  DULoxetine (CYMBALTA) 60 MG capsule, Take 60 mg by mouth daily., Disp: , Rfl:  .  famotidine (PEPCID) 20 MG tablet, Take 20 mg by mouth 2 (two) times daily., Disp: , Rfl:  .  guaiFENesin (MUCINEX) 600 MG 12 hr tablet, Take 1 tablet (600 mg total) by mouth 2 (two) times daily as needed for cough or to loosen phlegm., Disp: , Rfl:  .  insulin aspart protamine - aspart (NOVOLOG MIX 70/30 FLEXPEN) (70-30) 100 UNIT/ML FlexPen, Inject 0.2 mLs (20 Units total) into the skin 2 (two) times daily before a meal. As the patient and I discussed, start with a lower dose of Insulin as he has not been eating  well. Titrate Insulin upward to dose taken prior to surgery (60 units bid) as tolerates., Disp: 15 mL, Rfl: 11 .  Iron-FA-B Cmp-C-Biot-Probiotic (FUSION PLUS) CAPS, Take 1 tablet by mouth daily., Disp: 30 capsule, Rfl: 2 .  losartan (COZAAR) 50 MG tablet, Take 1 tablet (50 mg total) by mouth daily., Disp: 30 tablet, Rfl: 1 .  metFORMIN (GLUCOPHAGE-XR) 500 MG 24 hr tablet, Take 1,000 mg by mouth in the morning and at bedtime., Disp: , Rfl:  .  Multiple Vitamins-Minerals (MULTIVITAMIN ADULT PO), Take 1 tablet by mouth daily. , Disp: , Rfl:  .  omeprazole (PRILOSEC) 20 MG capsule, Take 20 mg by mouth 2 (two) times daily. , Disp: , Rfl:  .  rosuvastatin (CRESTOR) 40 MG tablet, Take 1 tablet (40 mg total) by mouth daily., Disp: 30 tablet, Rfl: 1 .  Semaglutide (OZEMPIC, 1 MG/DOSE, [place]), Inject 1 mg into the skin every Sunday. , Disp: , Rfl:  .  Trospium Chloride 60 MG CP24, Take 60 mg by mouth daily., Disp: , Rfl:   Past Medical History: Past Medical History:  Diagnosis Date  . Agent orange exposure   . Coronary artery disease    a. 04/2019 Cath: LM 99, LAD 80p, D2 70, LCX 50ost/p, RCA 70p, EF 55-65%; b. 04/2019  CABG x 3 (LIMA to LAD, SVG to OM1, SVG to RCA).  . Diabetic peripheral neuropathy ([place])   . Diastolic dysfunction    a 04/2019 Echo: EF 55-60%, mod conc LVH, Gr1 DD. Nl RV fxn. Mild Ao sclerosis w/o stenosis.  Marland Kitchen GERD (gastroesophageal reflux disease)   . Hyperlipidemia   . Hyperplasia of prostate with lower urinary tract symptoms (LUTS)   . Hypertension   . Prostate cancer [facility]) urologist-- dr ottelin/  oncologist-- dr Tammi Klippel   dx 02-21-2018  -- Stage T1c, Gleason 3+4  . Type 2 diabetes mellitus treated with insulin ([place])    followed by [facility] in [place]  . Wears dentures    upper   . Wears glasses    "wears to help double vision"    Tobacco Use: Social History   Tobacco Use  Smoking Status Never Smoker  Smokeless Tobacco Never Used    Labs: Recent Review Flowsheet Data     Labs for ITP Cardiac and Pulmonary Rehab Latest Ref Rng & Units 04/30/2019 04/30/2019 04/30/2019 04/30/2019 04/30/2019   Cholestrol 0 - 200 mg/dL - - - - -   LDLCALC 0 - 99 mg/dL - - - - -   HDL >40 mg/dL - - - - -   Trlycerides <150 mg/dL - - - - -   Hemoglobin A1c 4.8 - 5.6 % - - - - -   PHART 7.35 - 7.45 - 7.327(L) 7.324(L) 7.348(L) 7.365   PCO2ART 32 - 48 mmHg - 40.6 42.5 37.9 38.3   HCO3 20.0 - 28.0 mmol/L - 21.5 22.3 20.9 21.9   TCO2 22 - 32 mmol/L _0 ACIDBASEDEF 0.0 - 2.0 mmol/L - 5.0(H) 4.0(H) 4.0(H) 3.0(H)   O2SAT % - 91.0 90.0 93.0 94.0       Exercise Target Goals: Exercise Program Goal: Individual exercise prescription set using results from initial 6 min walk test and THRR while considering  patient's activity barriers and safety.   Exercise Prescription Goal: Initial exercise prescription builds to 30-45 minutes a day of aerobic activity, 2-3 days per week.  Home exercise guidelines will be given to patient during program as part of exercise prescription that the participant will acknowledge.   Education: Aerobic Exercise & Resistance Training: - Gives group verbal and written instruction on the various components of exercise. Focuses on aerobic and resistive training programs and the benefits of this training and how to safely progress through these programs..   Education: Exercise & Equipment Safety: - Individual verbal instruction and demonstration of equipment use and safety with use of the equipment.   Cardiac Rehab from 06/28/2019 in [facility] Cardiac and Pulmonary Rehab  Date 06/28/19  Educator [facility]  Instruction Review Code 1- Verbalizes Understanding      Education: Exercise Physiology & General Exercise Guidelines: - Group verbal and written instruction with models to review the exercise physiology of the cardiovascular system and associated critical values. Provides general exercise guidelines with specific guidelines to those with heart or lung disease.      Education: Flexibility, Balance, Mind/Body Relaxation: Provides group verbal/written instruction on the benefits of flexibility and balance training, including mind/body exercise modes such as yoga, pilates and tai chi.  Demonstration and skill practice provided.   Activity Barriers & Risk Stratification:  Activity Barriers & Cardiac Risk Stratification - 07/04/19 1210      Activity Barriers & Cardiac Risk Stratification   Activity Barriers Deconditioning;Muscular Weakness;Shortness of Breath;Other (comment);Balance Concerns;Joint Problems  Comments Neuropathy, left shoulder bone spurs    Cardiac Risk Stratification High           6 Minute Walk:  6 Minute Walk    Row Name 07/04/19 1205         6 Minute Walk   Phase Initial     Distance 1024 feet     Walk Time 6 minutes     # of Rest Breaks 0     MPH 1.94     METS 2.54     RPE 13     VO2 Peak 8.91     Symptoms No     Resting HR 101 bpm     Resting BP 132/64     Resting Oxygen Saturation  97 %     Exercise Oxygen Saturation  during 6 min walk 96 %     Max Ex. HR 121 bpm     Max Ex. BP 158/70     2 Minute Post BP 126/72            Oxygen Initial Assessment:   Oxygen Re-Evaluation:   Oxygen Discharge (Final Oxygen Re-Evaluation):   Initial Exercise Prescription:  Initial Exercise Prescription - 07/04/19 1200      Date of Initial Exercise RX and Referring Provider   Date 07/04/19    Referring Provider [name], MD    [place] (local cardiologist- Dr. Fletcher Anon)     Treadmill   MPH 1.8    Grade 0.5    Minutes 15    METs 2.5      REL-XR   Level 1    Speed 50    Minutes 15    METs 2.5      T5 Nustep   Level 1    SPM 80    Minutes 15    METs 2.5      Prescription Details   Frequency (times per week) 3    Duration Progress to 30 minutes of continuous aerobic without signs/symptoms of physical distress      Intensity   THRR 40-80% of Max Heartrate 119-137    Ratings of Perceived Exertion  11-13    Perceived Dyspnea 0-4      Progression   Progression Continue to progress workloads to maintain intensity without signs/symptoms of physical distress.      Resistance Training   Training Prescription Yes    Weight 4 lb    Reps 10-15           Perform Capillary Blood Glucose checks as needed.  Exercise Prescription Changes:  Exercise Prescription Changes    Row Name 07/04/19 1200 07/12/19 1400 07/23/19 1600 08/08/19 1600       Response to Exercise   Blood Pressure (Admit) 132/64 122/62 118/56 118/66    Blood Pressure (Exercise) 158/70 154/70 130/78 154/64    Blood Pressure (Exit) 126/72 130/80 114/64 142/68    Heart Rate (Admit) 101 bpm 122 bpm 99 bpm 91 bpm    Heart Rate (Exercise) 121 bpm 134 bpm 136 bpm 113 bpm    Heart Rate (Exit) 101 bpm 96 bpm 97 bpm 84 bpm    Oxygen Saturation (Admit) 97 % -- -- --    Oxygen Saturation (Exercise) 96 % -- -- --    Rating of Perceived Exertion (Exercise) _0 Symptoms None none none none    Comments Walk Test Results second full day of exercise -- --    Duration -- Progress  to 30 minutes of  aerobic without signs/symptoms of physical distress Continue with 30 min of aerobic exercise without signs/symptoms of physical distress. Continue with 30 min of aerobic exercise without signs/symptoms of physical distress.    Intensity -- THRR unchanged THRR unchanged THRR unchanged      Progression   Progression -- Continue to progress workloads to maintain intensity without signs/symptoms of physical distress. Continue to progress workloads to maintain intensity without signs/symptoms of physical distress. Continue to progress workloads to maintain intensity without signs/symptoms of physical distress.    Average METs -- 2.37 3.3 2.4      Resistance Training   Training Prescription -- Yes Yes Yes    Weight -- 4 lb 4 lb 4 lb    Reps -- 10-15 10-15 10-15      Interval Training   Interval Training -- No No No      Treadmill    MPH -- 1.8 1.8 1.8    Grade -- 0.5 0.5 0.5    Minutes -- _0 METs -- 2.5 2.5 2.5      REL-XR   Level -- 1 1 --    Minutes -- 15 15 --    METs -- 2.7 4.1 --      T5 Nustep   Level -- _1 SPM -- -- -- 80    Minutes -- _2 METs -- 1.9 -- 2           Exercise Comments:   Exercise Goals and Review:  Exercise Goals    Row Name 07/04/19 1230             Exercise Goals   Increase Physical Activity Yes       Intervention Provide advice, education, support and counseling about physical activity/exercise needs.;Develop an individualized exercise prescription for aerobic and resistive training based on initial evaluation findings, risk stratification, comorbidities and participant's personal goals.       Expected Outcomes Short Term: Attend rehab on a regular basis to increase amount of physical activity.;Long Term: Exercising regularly at least 3-5 days a week.;Long Term: Add in home exercise to make exercise part of routine and to increase amount of physical activity.       Increase Strength and Stamina Yes       Intervention Provide advice, education, support and counseling about physical activity/exercise needs.;Develop an individualized exercise prescription for aerobic and resistive training based on initial evaluation findings, risk stratification, comorbidities and participant's personal goals.       Expected Outcomes Short Term: Increase workloads from initial exercise prescription for resistance, speed, and METs.;Short Term: Perform resistance training exercises routinely during rehab and add in resistance training at home;Long Term: Improve cardiorespiratory fitness, muscular endurance and strength as measured by increased METs and functional capacity (6MWT)       Able to understand and use rate of perceived exertion (RPE) scale Yes       Intervention Provide education and explanation on how to use RPE scale       Expected Outcomes Short Term: Able to  use RPE daily in rehab to express subjective intensity level;Long Term:  Able to use RPE to guide intensity level when exercising independently       Able to understand and use Dyspnea scale Yes       Intervention Provide education and explanation on how to use Dyspnea scale  Expected Outcomes Short Term: Able to use Dyspnea scale daily in rehab to express subjective sense of shortness of breath during exertion;Long Term: Able to use Dyspnea scale to guide intensity level when exercising independently       Knowledge and understanding of Target Heart Rate Range (THRR) Yes       Intervention Provide education and explanation of THRR including how the numbers were predicted and where they are located for reference       Expected Outcomes Short Term: Able to state/look up THRR;Long Term: Able to use THRR to govern intensity when exercising independently;Short Term: Able to use daily as guideline for intensity in rehab       Able to check pulse independently Yes       Intervention Provide education and demonstration on how to check pulse in carotid and radial arteries.;Review the importance of being able to check your own pulse for safety during independent exercise       Expected Outcomes Short Term: Able to explain why pulse checking is important during independent exercise;Long Term: Able to check pulse independently and accurately       Understanding of Exercise Prescription Yes       Intervention Provide education, explanation, and written materials on patient's individual exercise prescription       Expected Outcomes Short Term: Able to explain program exercise prescription;Long Term: Able to explain home exercise prescription to exercise independently              Exercise Goals Re-Evaluation :  Exercise Goals Re-Evaluation    Row Name 07/12/19 1441 07/23/19 1540 08/08/19 1617         Exercise Goal Re-Evaluation   Exercise Goals Review Increase Physical Activity;Increase Strength and  Stamina;Understanding of Exercise Prescription Increase Physical Activity;Increase Strength and Stamina;Understanding of Exercise Prescription Increase Physical Activity;Increase Strength and Stamina;Understanding of Exercise Prescription     Comments Lesly Rubenstein is off to a good start in rehab.  He has completed his first two full days of exercise.  We will continue to montior his progress. Bo stated that his main limiting factor is SOB, but has noticed that is has improved some with exercising. He is still getting into a rhythm with rehab attendence as he went on vacation shortly after he started the program. Lesly Rubenstein has been attending consistently He reaches [facility] most sessions.  Staff will monitor progress.     Expected Outcomes Short: Continue to attend rehab regularly Long: Continue to follow program prescription Short: attend rehab on e reagular basis. Long: Become independent with an exercise routine. Short: continue to attend consistently Long:  build overall stamina            Discharge Exercise Prescription (Final Exercise Prescription Changes):  Exercise Prescription Changes - 08/08/19 1600      Response to Exercise   Blood Pressure (Admit) 118/66    Blood Pressure (Exercise) 154/64    Blood Pressure (Exit) 142/68    Heart Rate (Admit) 91 bpm    Heart Rate (Exercise) 113 bpm    Heart Rate (Exit) 84 bpm    Rating of Perceived Exertion (Exercise) 15    Symptoms none    Duration Continue with 30 min of aerobic exercise without signs/symptoms of physical distress.    Intensity THRR unchanged      Progression   Progression Continue to progress workloads to maintain intensity without signs/symptoms of physical distress.    Average METs 2.4      Resistance Training  Training Prescription Yes    Weight 4 lb    Reps 10-15      Interval Training   Interval Training No      Treadmill   MPH 1.8    Grade 0.5    Minutes 15    METs 2.5      T5 Nustep   Level 1    SPM 80    Minutes 15     METs 2           Nutrition:  Target Goals: Understanding of nutrition guidelines, daily intake of sodium <1540m, cholesterol <2066m calories 30% from fat and 7% or less from saturated fats, daily to have 5 or more servings of fruits and vegetables.  Education: Controlling Sodium/Reading Food Labels -Group verbal and written material supporting the discussion of sodium use in heart healthy nutrition. Review and explanation with models, verbal and written materials for utilization of the food label.   Education: General Nutrition Guidelines/Fats and Fiber: -Group instruction provided by verbal, written material, models and posters to present the general guidelines for heart healthy nutrition. Gives an explanation and review of dietary fats and fiber.   Biometrics:  Pre Biometrics - 07/04/19 1231      Pre Biometrics   Height 5' 9.9" (1.775 m)    Weight 207 lb 3.2 oz (94 kg)    BMI (Calculated) 29.83    Single Leg Stand 1.9 seconds            Nutrition Therapy Plan and Nutrition Goals:   Nutrition Assessments:  Nutrition Assessments - 07/04/19 1313      MEDFICTS Scores   Pre Score 66           MEDIFICTS Score Key:          ?70 Need to make dietary changes          40-70 Heart Healthy Diet         ? 40 Therapeutic Level Cholesterol Diet  Nutrition Goals Re-Evaluation:  Nutrition Goals Re-Evaluation    [place] 07/23/19 1547             Goals   Comment Has not yet had a chance to meet with program dietician.       Expected Outcome Short: schedule appointment program dietician. Long: help control cardiac risk factors with heart healthy diet.              Nutrition Goals Discharge (Final Nutrition Goals Re-Evaluation):  Nutrition Goals Re-Evaluation - 07/23/19 1547      Goals   Comment Has not yet had a chance to meet with program dietician.    Expected Outcome Short: schedule appointment program dietician. Long: help control cardiac risk factors with  heart healthy diet.           Psychosocial: Target Goals: Acknowledge presence or absence of significant depression and/or stress, maximize coping skills, provide positive support system. Participant is able to verbalize types and ability to use techniques and skills needed for reducing stress and depression.   Education: Depression - Provides group verbal and written instruction on the correlation between heart/lung disease and depressed mood, treatment options, and the stigmas associated with seeking treatment.   Education: Sleep Hygiene -Provides group verbal and written instruction about how sleep can affect your health.  Define sleep hygiene, discuss sleep cycles and impact of sleep habits. Review good sleep hygiene tips.     Education: Stress and Anxiety: - Provides group verbal and written instruction about  the health risks of elevated stress and causes of high stress.  Discuss the correlation between heart/lung disease and anxiety and treatment options. Review healthy ways to manage with stress and anxiety.    Initial Review & Psychosocial Screening:  Initial Psych Review & Screening - 06/28/19 1113      Initial Review   Current issues with Current Anxiety/Panic;Current Stress Concerns    Source of Stress Concerns Chronic Illness      Family Dynamics   Good Support System? Yes    Comments Short: Start [facility] to help with mood. Long: Maintain a healthy mental state.      Barriers   Psychosocial barriers to participate in program There are no identifiable barriers or psychosocial needs.;The patient should benefit from training in stress management and relaxation.      Screening Interventions   Interventions Provide feedback about the scores to participant;To provide support and resources with identified psychosocial needs;Encouraged to exercise    Expected Outcomes Short Term goal: Utilizing psychosocial counselor, staff and physician to assist with identification of  specific Stressors or current issues interfering with healing process. Setting desired goal for each stressor or current issue identified.;Long Term Goal: Stressors or current issues are controlled or eliminated.;Short Term goal: Identification and review with participant of any Quality of Life or Depression concerns found by scoring the questionnaire.;Long Term goal: The participant improves quality of Life and PHQ9 Scores as seen by post scores and/or verbalization of changes           Quality of Life Scores:   Quality of Life - 07/04/19 1310      Quality of Life   Select Quality of Life      Quality of Life Scores   Health/Function Pre 21.2 %    Socioeconomic Pre 24.88 %    Psych/Spiritual Pre 25.57 %    Family Pre 26.1 %    GLOBAL Pre 23.61 %          Scores of 19 and below usually indicate a poorer quality of life in these areas.  A difference of  2-3 points is a clinically meaningful difference.  A difference of 2-3 points in the total score of the Quality of Life Index has been associated with significant improvement in overall quality of life, self-image, physical symptoms, and general health in studies assessing change in quality of life.  PHQ-9: Recent Review Flowsheet Data    Depression screen [facility] 2/9 07/25/2019 07/04/2019   Decreased Interest 1 2   Down, Depressed, Hopeless 0 0   PHQ - 2 Score 1 2   Altered sleeping 1 0   Tired, decreased energy 1 3   Change in appetite 1 2   Feeling bad or failure about yourself  0 0   Trouble concentrating 0 1   Moving slowly or fidgety/restless 1 2   Suicidal thoughts 0 0   PHQ-9 Score 5 10   Difficult doing work/chores Not difficult at all  Somewhat difficult     Interpretation of Total Score  Total Score Depression Severity:  1-4 = Minimal depression, 5-9 = Mild depression, 10-14 = Moderate depression, 15-19 = Moderately severe depression, 20-27 = Severe depression   Psychosocial Evaluation and Intervention:  Psychosocial  Evaluation - 06/28/19 1119      Psychosocial Evaluation & Interventions   Interventions Encouraged to exercise with the program and follow exercise prescription    Comments Short: Start [facility] to help with mood. Long: Maintain a healthy mental state.  Expected Outcomes Short: Start [facility] to help with mood. Long: Maintain a healthy mental state.    Continue Psychosocial Services  Follow up required by staff           Psychosocial Re-Evaluation:  Psychosocial Re-Evaluation    [place] Name 07/23/19 1543             Psychosocial Re-Evaluation   Current issues with Current Stress Concerns;Current Sleep Concerns       Comments Lesly Rubenstein reports that he does have some trouble sleeping at night because of blatter control issues. He currently has an appointment scheduled to talk with his doctor about possible treatment for this issue in hopes that he will be able to have better uninterupted sleep.       Expected Outcomes Short: attend appointment to disucuss blatter control/ sleeping interruptions with doctor. Long: maintian positive attitude and good mental health habits.       Interventions Encouraged to attend Cardiac Rehabilitation for the exercise       Continue Psychosocial Services  Follow up required by staff              Psychosocial Discharge (Final Psychosocial Re-Evaluation):  Psychosocial Re-Evaluation - 07/23/19 1543      Psychosocial Re-Evaluation   Current issues with Current Stress Concerns;Current Sleep Concerns    Comments Lesly Rubenstein reports that he does have some trouble sleeping at night because of blatter control issues. He currently has an appointment scheduled to talk with his doctor about possible treatment for this issue in hopes that he will be able to have better uninterupted sleep.    Expected Outcomes Short: attend appointment to disucuss blatter control/ sleeping interruptions with doctor. Long: maintian positive attitude and good mental health habits.     Interventions Encouraged to attend Cardiac Rehabilitation for the exercise    Continue Psychosocial Services  Follow up required by staff           Vocational Rehabilitation: Provide vocational rehab assistance to qualifying candidates.   Vocational Rehab Evaluation & Intervention:   Education: Education Goals: Education classes will be provided on a variety of topics geared toward better understanding of heart health and risk factor modification. Participant will state understanding/return demonstration of topics presented as noted by education test scores.  Learning Barriers/Preferences:  Learning Barriers/Preferences - 06/28/19 1115      Learning Barriers/Preferences   Learning Barriers None    Learning Preferences None           General Cardiac Education Topics:  AED/CPR: - Group verbal and written instruction with the use of models to demonstrate the basic use of the AED with the basic ABC's of resuscitation.   Anatomy & Physiology of the Heart: - Group verbal and written instruction and models provide basic cardiac anatomy and physiology, with the coronary electrical and arterial systems. Review of Valvular disease and Heart Failure   Cardiac Procedures: - Group verbal and written instruction to review commonly prescribed medications for heart disease. Reviews the medication, class of the drug, and side effects. Includes the steps to properly store meds and maintain the prescription regimen. (beta blockers and nitrates)   Cardiac Medications I: - Group verbal and written instruction to review commonly prescribed medications for heart disease. Reviews the medication, class of the drug, and side effects. Includes the steps to properly store meds and maintain the prescription regimen.   Cardiac Medications II: -Group verbal and written instruction to review commonly prescribed medications for heart disease. Reviews the medication, class  of the drug, and side effects.  (all other drug classes)    Go Sex-Intimacy & Heart Disease, Get SMART - Goal Setting: - Group verbal and written instruction through game format to discuss heart disease and the return to sexual intimacy. Provides group verbal and written material to discuss and apply goal setting through the application of the S.M.A.R.T. Method.   Other Matters of the Heart: - Provides group verbal, written materials and models to describe Stable Angina and Peripheral Artery. Includes description of the disease process and treatment options available to the cardiac patient.   Infection Prevention: - Provides verbal and written material to individual with discussion of infection control including proper hand washing and proper equipment cleaning during exercise session.   Cardiac Rehab from 06/28/2019 in [facility] Cardiac and Pulmonary Rehab  Date 06/28/19  Educator [facility]  Instruction Review Code 1- Verbalizes Understanding      Falls Prevention: - Provides verbal and written material to individual with discussion of falls prevention and safety.   Cardiac Rehab from 06/28/2019 in [facility] Cardiac and Pulmonary Rehab  Date 06/28/19  Educator [facility]  Instruction Review Code 1- Verbalizes Understanding      Other: -Provides group and verbal instruction on various topics (see comments)   Knowledge Questionnaire Score:  Knowledge Questionnaire Score - 07/04/19 1314      Knowledge Questionnaire Score   Pre Score 23/26- Exercise, PAD, Nutrition           Core Components/Risk Factors/Patient Goals at Admission:  Personal Goals and Risk Factors at Admission - 07/04/19 1315      Core Components/Risk Factors/Patient Goals on Admission    Weight Management Yes;Weight Loss    Intervention Weight Management: Develop a combined nutrition and exercise program designed to reach desired caloric intake, while maintaining appropriate intake of nutrient and fiber, sodium and fats, and appropriate energy expenditure  required for the weight goal.;Weight Management: Provide education and appropriate resources to help participant work on and attain dietary goals.    Admit Weight 207 lb 3.2 oz (94 kg)    Goal Weight: Short Term 202 lb (91.6 kg)    Goal Weight: Long Term 197 lb (89.4 kg)    Expected Outcomes Short Term: Continue to assess and modify interventions until short term weight is achieved;Long Term: Adherence to nutrition and physical activity/exercise program aimed toward attainment of established weight goal;Understanding recommendations for meals to include 15-35% energy as protein, 25-35% energy from fat, 35-60% energy from carbohydrates, less than '200mg'$  of dietary cholesterol, 20-35 gm of total fiber daily;Understanding of distribution of calorie intake throughout the day with the consumption of 4-5 meals/snacks;Weight Loss: Understanding of general recommendations for a balanced deficit meal plan, which promotes 1-2 lb weight loss per week and includes a negative energy balance of [phone] kcal/d    Diabetes Yes    Intervention Provide education about signs/symptoms and action to take for hypo/hyperglycemia.;Provide education about proper nutrition, including hydration, and aerobic/resistive exercise prescription along with prescribed medications to achieve blood glucose in normal ranges: Fasting glucose 65-99 mg/dL    Expected Outcomes Short Term: Participant verbalizes understanding of the signs/symptoms and immediate care of hyper/hypoglycemia, proper foot care and importance of medication, aerobic/resistive exercise and nutrition plan for blood glucose control.;Long Term: Attainment of HbA1C < 7%.    Hypertension Yes    Intervention Provide education on lifestyle modifcations including regular physical activity/exercise, weight management, moderate sodium restriction and increased consumption of fresh fruit, vegetables, and low fat dairy, alcohol moderation,  and smoking cessation.;Monitor prescription  use compliance.    Expected Outcomes Long Term: Maintenance of blood pressure at goal levels.;Short Term: Continued assessment and intervention until BP is < 140/57m HG in hypertensive participants. < 130/816mHG in hypertensive participants with diabetes, heart failure or chronic kidney disease.    Lipids Yes    Intervention Provide education and support for participant on nutrition & aerobic/resistive exercise along with prescribed medications to achieve LDL '70mg'$ , HDL >'40mg'$ .    Expected Outcomes Short Term: Participant states understanding of desired cholesterol values and is compliant with medications prescribed. Participant is following exercise prescription and nutrition guidelines.;Long Term: Cholesterol controlled with medications as prescribed, with individualized exercise RX and with personalized nutrition plan. Value goals: LDL < '70mg'$ , HDL > 40 mg.           Education:Diabetes - Individual verbal and written instruction to review signs/symptoms of diabetes, desired ranges of glucose level fasting, after meals and with exercise. Acknowledge that pre and post exercise glucose checks will be done for 3 sessions at entry of program.   [facility]  [facility]28[facility]19  [facility]- Verbal[facility]      Education: Know Your Numbers and Risk Factors: -Group verbal and written instruction about important numbers in your health.  Discussion of what are risk factors and how they play a role in the disease process.  Review of Cholesterol, Blood Pressure, Diabetes, and BMI and the role they play in your overall health.   Core Components/Risk Factors/Patient Goals Review:   Goals and Risk Factor Review    Row Name 07/23/19 1549             Core Components/Risk Factors/Patient Goals Review   Personal Goals Review Weight Management/Obesity;Lipids;Diabetes;Hypertension       Review Bo reports that he  monitors his BP and Blood glucose at home at least 2 times a day or if he is symptomatic. He is currently taking all meds a prescribed. His weight has been maintained since starting the program.       Expected Outcomes Short: continue to monitor blood pressure and blood sugar at home. Long: Continue with a heart healthy lifestyle to control risk factors.              Core Components/Risk Factors/Patient Goals at Discharge (Final Review):   Goals and Risk Factor Review - 07/23/19 1549      Core Components/Risk Factors/Patient Goals Review   Personal Goals Review Weight Management/Obesity;Lipids;Diabetes;Hypertension    Review Bo reports that he monitors his BP and Blood glucose at home at least 2 times a day or if he is symptomatic. He is currently taking all meds a prescribed. His weight has been maintained since starting the program.    Expected Outcomes Short: continue to monitor blood pressure and blood sugar at home. Long: Continue with a heart healthy lifestyle to control risk factors.           ITP Comments:  ITP Comments    Row Name 06/28/19 1125 07/04/19 1204 07/18/19 0616 08/15/19 0640     ITP Comments Virtual Visit completed. Patient informed on EP and RD appointment and 6 Minute walk test. Patient also informed of patient health questionnaires on My Chart. Patient Verbalizes understanding. [facility] Completed 6MWT and gym orientation.  Initial ITP created and sent for review to Dr. MaEmily FilbertMedical Director. 30 Day review  completed. Medical Director ITP review done, changes made as directed, and signed approval by Medical Director. 30 Day review completed. Medical Director ITP review done, changes made as directed, and signed approval by Medical Director.           Comments:

## 2019-08-16 ENCOUNTER — Ambulatory Visit: Payer: No Typology Code available for payment source | Admitting: Certified Registered"

## 2019-08-16 ENCOUNTER — Encounter: Payer: Self-pay | Admitting: Gastroenterology

## 2019-08-16 ENCOUNTER — Encounter
Admission: RE | Disposition: A | Discharge status: Home / Self Care | Payer: Self-pay | Source: Ambulatory Visit | Attending: Gastroenterology

## 2019-08-16 ENCOUNTER — Ambulatory Visit
Admission: RE | Admit: 2019-08-16 | Discharge: 2019-08-16 | Disposition: A | Discharge status: Home / Self Care | Payer: No Typology Code available for payment source | Source: Ambulatory Visit | Attending: Gastroenterology | Admitting: Gastroenterology

## 2019-08-16 ENCOUNTER — Other Ambulatory Visit: Payer: Self-pay

## 2019-08-16 ENCOUNTER — Telehealth: Payer: Self-pay

## 2019-08-16 DIAGNOSIS — K635 Polyp of colon: Secondary | ICD-10-CM

## 2019-08-16 DIAGNOSIS — I10 Essential (primary) hypertension: Secondary | ICD-10-CM | POA: Diagnosis not present

## 2019-08-16 DIAGNOSIS — Z79899 Other long term (current) drug therapy: Secondary | ICD-10-CM | POA: Diagnosis not present

## 2019-08-16 DIAGNOSIS — Z13810 Encounter for screening for upper gastrointestinal disorder: Secondary | ICD-10-CM | POA: Diagnosis present

## 2019-08-16 DIAGNOSIS — D123 Benign neoplasm of transverse colon: Secondary | ICD-10-CM | POA: Diagnosis not present

## 2019-08-16 DIAGNOSIS — Z7982 Long term (current) use of aspirin: Secondary | ICD-10-CM | POA: Diagnosis not present

## 2019-08-16 DIAGNOSIS — D509 Iron deficiency anemia, unspecified: Secondary | ICD-10-CM

## 2019-08-16 DIAGNOSIS — Z951 Presence of aortocoronary bypass graft: Secondary | ICD-10-CM | POA: Diagnosis not present

## 2019-08-16 DIAGNOSIS — K259 Gastric ulcer, unspecified as acute or chronic, without hemorrhage or perforation: Secondary | ICD-10-CM | POA: Insufficient documentation

## 2019-08-16 DIAGNOSIS — E782 Mixed hyperlipidemia: Secondary | ICD-10-CM | POA: Diagnosis not present

## 2019-08-16 DIAGNOSIS — E785 Hyperlipidemia, unspecified: Secondary | ICD-10-CM | POA: Diagnosis not present

## 2019-08-16 DIAGNOSIS — Z86010 Personal history of colonic polyps: Secondary | ICD-10-CM | POA: Diagnosis not present

## 2019-08-16 DIAGNOSIS — Z794 Long term (current) use of insulin: Secondary | ICD-10-CM | POA: Insufficient documentation

## 2019-08-16 DIAGNOSIS — I2510 Atherosclerotic heart disease of native coronary artery without angina pectoris: Secondary | ICD-10-CM | POA: Insufficient documentation

## 2019-08-16 DIAGNOSIS — Z8249 Family history of ischemic heart disease and other diseases of the circulatory system: Secondary | ICD-10-CM | POA: Insufficient documentation

## 2019-08-16 DIAGNOSIS — Z8546 Personal history of malignant neoplasm of prostate: Secondary | ICD-10-CM | POA: Insufficient documentation

## 2019-08-16 DIAGNOSIS — Z833 Family history of diabetes mellitus: Secondary | ICD-10-CM | POA: Insufficient documentation

## 2019-08-16 DIAGNOSIS — K449 Diaphragmatic hernia without obstruction or gangrene: Secondary | ICD-10-CM | POA: Diagnosis not present

## 2019-08-16 DIAGNOSIS — K228 Other specified diseases of esophagus: Secondary | ICD-10-CM | POA: Diagnosis not present

## 2019-08-16 DIAGNOSIS — K219 Gastro-esophageal reflux disease without esophagitis: Secondary | ICD-10-CM | POA: Diagnosis not present

## 2019-08-16 DIAGNOSIS — E1142 Type 2 diabetes mellitus with diabetic polyneuropathy: Secondary | ICD-10-CM | POA: Diagnosis not present

## 2019-08-16 DIAGNOSIS — D120 Benign neoplasm of cecum: Secondary | ICD-10-CM | POA: Diagnosis not present

## 2019-08-16 DIAGNOSIS — C61 Malignant neoplasm of prostate: Secondary | ICD-10-CM | POA: Diagnosis not present

## 2019-08-16 HISTORY — PX: COLONOSCOPY WITH PROPOFOL: SHX5780

## 2019-08-16 HISTORY — PX: ESOPHAGOGASTRODUODENOSCOPY (EGD) WITH PROPOFOL: SHX5813

## 2019-08-16 LAB — GLUCOSE, CAPILLARY: Glucose-Capillary: 144 mg/dL — ABNORMAL HIGH (ref 70–99)

## 2019-08-16 SURGERY — COLONOSCOPY WITH PROPOFOL
Anesthesia: General

## 2019-08-16 MED ORDER — PROPOFOL 10 MG/ML IV BOLUS
INTRAVENOUS | Status: DC | PRN
  Administered 2019-08-16: 50 mg via INTRAVENOUS

## 2019-08-16 MED ORDER — LIDOCAINE HCL (CARDIAC) PF 100 MG/5ML IV SOSY
PREFILLED_SYRINGE | INTRAVENOUS | Status: DC | PRN
  Administered 2019-08-16: 50 mg via INTRAVENOUS

## 2019-08-16 MED ORDER — PROPOFOL 500 MG/50ML IV EMUL
INTRAVENOUS | Status: DC | PRN
  Administered 2019-08-16: 125 ug/kg/min via INTRAVENOUS

## 2019-08-16 MED ORDER — PROPOFOL 500 MG/50ML IV EMUL
INTRAVENOUS | Status: AC
  Filled 2019-08-16: qty 50

## 2019-08-16 MED ORDER — SODIUM CHLORIDE 0.9 % IV SOLN
INTRAVENOUS | Status: DC
  Administered 2019-08-16: 1000 mL via INTRAVENOUS

## 2019-08-16 NOTE — Anesthesia Preprocedure Evaluation (Signed)
Anesthesia Evaluation  Patient identified by MRN, date of birth, ID band Patient awake    Reviewed: Allergy & Precautions, H&P , NPO status , Patient's Chart, lab work & pertinent test results, reviewed documented beta blocker date and time   History of Anesthesia Complications Negative for: history of anesthetic complications  Airway Mallampati: II  TM Distance: >3 FB Neck ROM: full    Dental  (+) Dental Advidsory Given, Edentulous Upper, Poor Dentition   Pulmonary neg pulmonary ROS,    Pulmonary exam normal breath sounds clear to auscultation       Cardiovascular Exercise Tolerance: Good hypertension, (-) angina+ CAD and + CABG  (-) Past MI and (-) Cardiac Stents Normal cardiovascular exam(-) dysrhythmias (-) Valvular Problems/Murmurs Rhythm:regular Rate:Normal     Neuro/Psych neg Seizures  Neuromuscular disease negative psych ROS   GI/Hepatic Neg liver ROS, GERD  ,  Endo/Other  diabetes, Well Controlled, Insulin Dependent  Renal/GU CRFRenal disease  negative genitourinary   Musculoskeletal   Abdominal   Peds  Hematology negative hematology ROS (+)   Anesthesia Other Findings Past Medical History: No date: Agent orange exposure No date: Coronary artery disease     Comment:  a. 04/2019 Cath: LM 99, LAD 80p, D2 70, LCX 50ost/p, RCA               70p, EF 55-65%; b. 04/2019 CABG x 3 (LIMA to LAD, SVG to               OM1, SVG to RCA). No date: Diabetic peripheral neuropathy (HCC) No date: Diastolic dysfunction     Comment:  a 04/2019 Echo: EF 55-60%, mod conc LVH, Gr1 DD. Nl RV               fxn. Mild Ao sclerosis w/o stenosis. No date: GERD (gastroesophageal reflux disease) No date: Hyperlipidemia No date: Hyperplasia of prostate with lower urinary tract symptoms  (LUTS) No date: Hypertension urologist-- dr ottelin/  oncologist-- dr Tammi Klippel: Prostate cancer  [facility])     Comment:  dx 02-21-2018  -- Stage T1c,  Gleason 3+4 No date: Type 2 diabetes mellitus treated with insulin ([place])     Comment:  [facility] [place] No date: Wears dentures     Comment:  upper  No date: Wears glasses     Comment:  "wears to help double vision"   Reproductive/Obstetrics negative OB ROS                             Anesthesia Physical Anesthesia Plan  ASA: III  Anesthesia Plan: General   Post-op Pain Management:    Induction: Intravenous  PONV Risk Score and Plan: 2 and Propofol infusion and TIVA  Airway Management Planned: Natural Airway and Nasal Cannula  Additional Equipment:   Intra-op Plan:   Post-operative Plan:   Informed Consent: I have reviewed the patients History and Physical, chart, labs and discussed the procedure including the risks, benefits and alternatives for the proposed anesthesia with the patient or authorized representative who has indicated his/her understanding and acceptance.     Dental Advisory Given  Plan Discussed with: Anesthesiologist, CRNA and Surgeon  Anesthesia Plan Comments:         Anesthesia Quick Evaluation

## 2019-08-16 NOTE — Anesthesia Postprocedure Evaluation (Signed)
Anesthesia Post Note  Patient: Cristian Martin  Procedure(s) Performed: COLONOSCOPY WITH PROPOFOL (N/A ) ESOPHAGOGASTRODUODENOSCOPY (EGD) WITH PROPOFOL (N/A )  Patient location during evaluation: Endoscopy Anesthesia Type: General Level of consciousness: awake and alert Pain management: pain level controlled Vital Signs Assessment: post-procedure vital signs reviewed and stable Respiratory status: spontaneous breathing, nonlabored ventilation, respiratory function stable and patient connected to nasal cannula oxygen Cardiovascular status: blood pressure returned to baseline and stable Postop Assessment: no apparent nausea or vomiting Anesthetic complications: no   No complications documented.   Last Vitals:  Vitals:   08/16/19 0734 08/16/19 0852  BP: (!) 154/88 107/73  Pulse: (!) 106   Resp: 16   Temp: (!) 36.3 C 36.4 C  SpO2: 96%     Last Pain:  Vitals:   08/16/19 0922  TempSrc:   PainSc: 0-No pain                 Martha Clan

## 2019-08-16 NOTE — Op Note (Signed)
[facility] Gastroenterology Patient Name: Cristian Martin Procedure Date: 08/16/2019 8:08 AM MRN: [id number] Account #: [id number] Date of Birth: 20-May-1945 Admit Type: Outpatient Age: 74 Room: [facility] ENDO ROOM 4 Gender: Male Note Status: Finalized Procedure:             Colonoscopy Indications:           Unexplained iron deficiency anemia Providers:             Lin Landsman MD, MD Referring MD:          [facility], MD (Referring MD) Medicines:             Monitored Anesthesia Care Complications:         No immediate complications. Estimated blood loss: None. Procedure:             Pre-Anesthesia Assessment:                        - Prior to the procedure, a History and Physical was                         performed, and patient medications and allergies were                         reviewed. The patient is competent. The risks and                         benefits of the procedure and the sedation options and                         risks were discussed with the patient. All questions                         were answered and informed consent was obtained.                         Patient identification and proposed procedure were                         verified by the physician, the nurse, the                         anesthesiologist, the anesthetist and the technician                         in the pre-procedure area in the procedure room in the                         endoscopy suite. Mental Status Examination: alert and                         oriented. Airway Examination: normal oropharyngeal                         airway and neck mobility. Respiratory Examination:                         clear to auscultation. CV Examination: normal.  Prophylactic Antibiotics: The patient does not require                         prophylactic antibiotics. Prior Anticoagulants: The                         patient has taken no previous anticoagulant or                          antiplatelet agents. ASA Grade Assessment: III - A                         patient with severe systemic disease. After reviewing                         the risks and benefits, the patient was deemed in                         satisfactory condition to undergo the procedure. The                         anesthesia plan was to use monitored anesthesia care                         (MAC). Immediately prior to administration of                         medications, the patient was re-assessed for adequacy                         to receive sedatives. The heart rate, respiratory                         rate, oxygen saturations, blood pressure, adequacy of                         pulmonary ventilation, and response to care were                         monitored throughout the procedure. The physical                         status of the patient was re-assessed after the                         procedure.                        After obtaining informed consent, the colonoscope was                         passed under direct vision. Throughout the procedure,                         the patient's blood pressure, pulse, and oxygen                         saturations were monitored continuously. The  Colonoscope was introduced through the anus and                         advanced to the the cecum, identified by appendiceal                         orifice and ileocecal valve. The colonoscopy was                         performed with moderate difficulty due to inadequate                         bowel prep. Successful completion of the procedure was                         aided by lavage. The patient tolerated the procedure                         well. The quality of the bowel preparation was fair. Findings:      The perianal and digital rectal examinations were normal. Pertinent       negatives include normal sphincter tone and no palpable rectal  lesions.      Four sessile polyps were found in the transverse colon and cecum. The       polyps were 4 to 5 mm in size. These polyps were removed with a cold       snare. Resection and retrieval were complete.      A diminutive polyp was found in the cecum. The polyp was sessile. The       polyp was removed with a cold biopsy forceps. Resection and retrieval       were complete.      The retroflexed view of the distal rectum and anal verge was normal and       showed no anal or rectal abnormalities. Impression:            - Preparation of the colon was fair.                        - Four 4 to 5 mm polyps in the transverse colon and in                         the cecum, removed with a cold snare. Resected and                         retrieved.                        - One diminutive polyp in the cecum, removed with a                         cold biopsy forceps. Resected and retrieved.                        - The distal rectum and anal verge are normal on                         retroflexion view. Recommendation:        - Discharge patient to  home (with escort).                        - Resume previous diet today.                        - Continue present medications.                        - Await pathology results.                        - Repeat colonoscopy in 3 years for surveillance of                         multiple polyps.                        - Return to my office as previously scheduled. Procedure Code(s):     --- Professional ---                        [phone], Colonoscopy, flexible; with removal of                         tumor(s), polyp(s), or other lesion(s) by snare                         technique                        [id number], 80, Colonoscopy, flexible; with biopsy, single                         or multiple Diagnosis Code(s):     --- Professional ---                        K63.5, Polyp of colon                        D50.9, Iron deficiency anemia, unspecified CPT  copyright 2019 [employer] Medical Association. All rights reserved. The codes documented in this report are preliminary and upon coder review may  be revised to meet current compliance requirements. Dr. Ulyess Mort Lin Landsman MD, MD 08/16/2019 8:51:11 AM This report has been signed electronically. Number of Addenda: 0 Note Initiated On: 08/16/2019 8:08 AM Scope Withdrawal Time: 0 hours 16 minutes 25 seconds  Total Procedure Duration: 0 hours 20 minutes 15 seconds  Estimated Blood Loss:  Estimated blood loss: none.      [facility]

## 2019-08-16 NOTE — H&P (Signed)
Cristian Darby, MD [address]  [place], [place] [address]  Main: [phone]  Fax: [phone] Pager: [phone]  Primary Care Physician:  [facility] Primary Gastroenterologist:  Dr. Cephas Martin  Pre-Procedure History & Physical: HPI:  Cristian Martin is a 74 y.o. male is here for an endoscopy and colonoscopy.   Past Medical History:  Diagnosis Date  . Agent orange exposure   . Coronary artery disease    a. 04/2019 Cath: LM 99, LAD 80p, D2 70, LCX 50ost/p, RCA 70p, EF 55-65%; b. 04/2019 CABG x 3 (LIMA to LAD, SVG to OM1, SVG to RCA).  . Diabetic peripheral neuropathy ([place])   . Diastolic dysfunction    a 04/2019 Echo: EF 55-60%, mod conc LVH, Gr1 DD. Nl RV fxn. Mild Ao sclerosis w/o stenosis.  Marland Kitchen GERD (gastroesophageal reflux disease)   . Hyperlipidemia   . Hyperplasia of prostate with lower urinary tract symptoms (LUTS)   . Hypertension   . Prostate cancer [facility]) urologist-- dr ottelin/  oncologist-- dr Tammi Klippel   dx 02-21-2018  -- Stage T1c, Gleason 3+4  . Type 2 diabetes mellitus treated with insulin ([place])    followed by [facility] in [place]  . Wears dentures    upper   . Wears glasses    "wears to help double vision"    Past Surgical History:  Procedure Laterality Date  . CORONARY ARTERY BYPASS GRAFT N/A 04/30/2019   Procedure: CORONARY ARTERY BYPASS GRAFTING (CABG) TIMES THREE USING LEFT INTERNAL MAMMARY AND RIGHT GREATER SAPHENOUS VEIN HARVESTED ENDOSCOPICALLY. MAMMARY ARTERY TO LAD, SAPHENOUS VEIN GRAFT TO  OM1, SAPHENOUS VEING GRAFT TO RIGHT. BIOPSY OF MEDIASTINAL TISSUE PLACEMENT OF RIGHT FEMORAL A-LINE;  Surgeon: Grace Isaac, MD;  Location: [facility];  Service: Open Heart Surgery;  Laterality: N/A;  . CYSTOSCOPY N/A 07/14/2018   Procedure: CYSTOSCOPY;  Surgeon: Kathie Rhodes, MD;  Location: [facility];  Service: Urology;  Laterality: N/A;  no seeds in bladder per Dr Karsten Ro  . LEFT HEART CATH AND CORONARY ANGIOGRAPHY N/A  04/27/2019   Procedure: LEFT HEART CATH AND CORONARY ANGIOGRAPHY;  Surgeon: Wellington Hampshire, MD;  Location: [facility];  Service: Cardiovascular;  Laterality: N/A;  . PROSTATE BIOPSY  02-21-2018  dr Karsten Ro in office  . RADIOACTIVE SEED IMPLANT N/A 07/14/2018   Procedure: RADIOACTIVE SEED IMPLANT/BRACHYTHERAPY IMPLANT;  Surgeon: Kathie Rhodes, MD;  Location: [facility];  Service: Urology;  Laterality: N/A;  77 seeds   . SHOULDER ARTHROSCOPY Left 2018   in [place]   "remove spur"  . SPACE OAR INSTILLATION N/A 07/14/2018   Procedure: SPACE OAR INSTILLATION;  Surgeon: Kathie Rhodes, MD;  Location: [facility];  Service: Urology;  Laterality: N/A;  . TEE WITHOUT CARDIOVERSION N/A 04/30/2019   Procedure: TRANSESOPHAGEAL ECHOCARDIOGRAM (TEE);  Surgeon: Grace Isaac, MD;  Location: [facility];  Service: Open Heart Surgery;  Laterality: N/A;    Prior to Admission medications   Medication Sig Start Date End Date Taking? Authorizing Provider  amLODipine (NORVASC) 10 MG tablet Take 10 mg by mouth daily.   Yes [provider]  carvedilol (COREG) 6.25 MG tablet Take 3.125 mg by mouth 2 (two) times daily with a meal.   Yes [provider]  cloNIDine (CATAPRES) 0.3 MG tablet Take 1 tablet (0.3 mg total) by mouth 2 (two) times daily. 05/04/19  Yes Lars Pinks M, PA-C  DULoxetine (CYMBALTA) 60 MG capsule Take 60 mg by  mouth daily.   Yes [provider]  famotidine (PEPCID) 20 MG tablet Take 20 mg by mouth 2 (two) times daily.   Yes [provider]  insulin aspart protamine - aspart (NOVOLOG MIX 70/30 FLEXPEN) (70-30) 100 UNIT/ML FlexPen Inject 0.2 mLs (20 Units total) into the skin 2 (two) times daily before a meal. As the patient and I discussed, start with a lower dose of Insulin as he has not been eating well. Titrate Insulin upward to dose taken prior to surgery (60 units bid) as tolerates. 05/04/19  Yes Lars Pinks M,  PA-C  losartan (COZAAR) 50 MG tablet Take 1 tablet (50 mg total) by mouth daily. 05/04/19  Yes Lars Pinks M, PA-C  metFORMIN (GLUCOPHAGE-XR) 500 MG 24 hr tablet Take 1,000 mg by mouth in the morning and at bedtime.   Yes [provider]  omeprazole (PRILOSEC) 20 MG capsule Take 20 mg by mouth 2 (two) times daily.    Yes [provider]  rosuvastatin (CRESTOR) 40 MG tablet Take 1 tablet (40 mg total) by mouth daily. 05/04/19  Yes Lars Pinks M, PA-C  Trospium Chloride 60 MG CP24 Take 60 mg by mouth daily.   Yes [provider]  acetaminophen (TYLENOL) 325 MG tablet Take 2 tablets (650 mg total) by mouth every 6 (six) hours as needed for mild pain. 05/03/19   Nani Skillern, PA-C  aspirin EC 81 MG tablet Take 1 tablet (81 mg total) by mouth daily. Swallow whole. 07/27/19   Theora Gianotti, NP  calcium carbonate (TUMS - DOSED IN MG ELEMENTAL CALCIUM) 500 MG chewable tablet Chew 2-3 tablets by mouth daily as needed for indigestion or heartburn.    [provider]  doxazosin (CARDURA) 4 MG tablet Take 2 mg by mouth at bedtime.     [provider]  guaiFENesin (MUCINEX) 600 MG 12 hr tablet Take 1 tablet (600 mg total) by mouth 2 (two) times daily as needed for cough or to loosen phlegm. 05/03/19   Lars Pinks M, PA-C  Iron-FA-B Cmp-C-Biot-Probiotic (FUSION PLUS) CAPS Take 1 tablet by mouth daily. 07/10/19   Lin Landsman, MD  Multiple Vitamins-Minerals (MULTIVITAMIN ADULT PO) Take 1 tablet by mouth daily.     [provider]  Semaglutide (OZEMPIC, 1 MG/DOSE, [place]) Inject 1 mg into the skin every Sunday.     [provider]    Allergies as of 07/06/2019 - Review Complete 07/05/2019  Allergen Reaction Noted  . Ace inhibitors Other (See Comments) 08/01/2013    Family History  Problem Relation Age of Onset  . Stroke Mother   . Diabetes Father   . Hypertension Father   . Diabetes Sister   . Cancer Neg Hx      Social History   Socioeconomic History  . Marital status: Married    Spouse name: Not on file  . Number of children: Not on file  . Years of education: Not on file  . Highest education level: Not on file  Occupational History  . Not on file  Tobacco Use  . Smoking status: Never Smoker  . Smokeless tobacco: Never Used  Vaping Use  . Vaping Use: Never used  Substance and Sexual Activity  . Alcohol use: Not Currently  . Drug use: Never  . Sexual activity: Not Currently  Other Topics Concern  . Not on file  Social History Narrative  . Not on file   Social Determinants of Health   Financial Resource Strain:   .  Difficulty of Paying Living Expenses:   Food Insecurity:   . Worried About Charity fundraiser in the Last Year:   . Arboriculturist in the Last Year:   Transportation Needs:   . Film/video editor (Medical):   Marland Kitchen Lack of Transportation (Non-Medical):   Physical Activity:   . Days of Exercise per Week:   . Minutes of Exercise per Session:   Stress:   . Feeling of Stress :   Social Connections:   . Frequency of Communication with Friends and Family:   . Frequency of Social Gatherings with Friends and Family:   . Attends Religious Services:   . Active Member of Clubs or Organizations:   . Attends Archivist Meetings:   Marland Kitchen Marital Status:   Intimate Partner Violence:   . Fear of Current or Ex-Partner:   . Emotionally Abused:   Marland Kitchen Physically Abused:   . Sexually Abused:     Review of Systems: See HPI, otherwise negative ROS  Physical Exam: BP (!) 154/88   Pulse (!) 106   Temp (!) 97.3 F (36.3 C) (Tympanic)   Resp 16   Ht 5\' 11"  (1.803 m)   Wt 90.7 kg   SpO2 96%   BMI 27.89 kg/m  General:   Alert,  pleasant and cooperative in NAD Head:  Normocephalic and atraumatic. Neck:  Supple; no masses or thyromegaly. Lungs:  Clear throughout to auscultation.    Heart:  Regular rate and rhythm. Abdomen:  Soft, nontender and nondistended.  Normal bowel sounds, without guarding, and without rebound.   Neurologic:  Alert and  oriented x4;  grossly normal neurologically.  Impression/Plan: CARMEL WADDINGTON is here for an endoscopy and colonoscopy to be performed for IDA  Risks, benefits, limitations, and alternatives regarding  endoscopy and colonoscopy have been reviewed with the patient.  Questions have been answered.  All parties agreeable.   Sherri Sear, MD  08/16/2019, 8:12 AM

## 2019-08-16 NOTE — Anesthesia Procedure Notes (Signed)
Date/Time: 08/16/2019 8:14 AM Performed by: Jerrye Noble, CRNA Pre-anesthesia Checklist: Patient identified, Emergency Drugs available, Suction available and Patient being monitored Oxygen Delivery Method: Nasal cannula

## 2019-08-16 NOTE — Transfer of Care (Signed)
Immediate Anesthesia Transfer of Care Note  Patient: Cristian Martin  Procedure(s) Performed: COLONOSCOPY WITH PROPOFOL (N/A ) ESOPHAGOGASTRODUODENOSCOPY (EGD) WITH PROPOFOL (N/A )  Patient Location: PACU and Endoscopy Unit  Anesthesia Type:General  Level of Consciousness: drowsy  Airway & Oxygen Therapy: Patient Spontanous Breathing and Patient connected to nasal cannula oxygen  Post-op Assessment: Report given to RN and Post -op Vital signs reviewed and stable  Post vital signs: Reviewed and stable  Last Vitals:  Vitals Value Taken Time  BP 107/73 08/16/19 0852  Temp 36.4 C 08/16/19 0852  Pulse 89 08/16/19 0854  Resp 19 08/16/19 0854  SpO2 95 % 08/16/19 0854  Vitals shown include unvalidated device data.  Last Pain:  Vitals:   08/16/19 0852  TempSrc: Temporal  PainSc:          Complications: No complications documented.

## 2019-08-16 NOTE — Op Note (Signed)
[facility] Gastroenterology Patient Name: Cristian Martin Procedure Date: 08/16/2019 8:09 AM MRN: [id number] Account #: [id number] Date of Birth: 1945/12/25 Admit Type: Outpatient Age: 73 Room: [facility] ENDO ROOM 4 Gender: Male Note Status: Finalized Procedure:             Upper GI endoscopy Indications:           Screening for Barrett's esophagus, Screening for                         Barrett's esophagus in patient at risk for this                         condition, Unexplained iron deficiency anemia,                         Follow-up of gastro-esophageal reflux disease Providers:             Lin Landsman MD, MD Referring MD:          [facility], MD (Referring MD) Medicines:             Monitored Anesthesia Care Complications:         No immediate complications. Estimated blood loss: None. Procedure:             Pre-Anesthesia Assessment:                        - Prior to the procedure, a History and Physical was                         performed, and patient medications and allergies were                         reviewed. The patient is competent. The risks and                         benefits of the procedure and the sedation options and                         risks were discussed with the patient. All questions                         were answered and informed consent was obtained.                         Patient identification and proposed procedure were                         verified by the physician, the nurse, the                         anesthesiologist, the anesthetist and the technician                         in the pre-procedure area in the procedure room in the                         endoscopy suite. Mental Status Examination: alert and  oriented. Airway Examination: normal oropharyngeal                         airway and neck mobility. Respiratory Examination:                         clear to auscultation. CV Examination:  normal.                         Prophylactic Antibiotics: The patient does not require                         prophylactic antibiotics. Prior Anticoagulants: The                         patient has taken no previous anticoagulant or                         antiplatelet agents. ASA Grade Assessment: III - A                         patient with severe systemic disease. After reviewing                         the risks and benefits, the patient was deemed in                         satisfactory condition to undergo the procedure. The                         anesthesia plan was to use monitored anesthesia care                         (MAC). Immediately prior to administration of                         medications, the patient was re-assessed for adequacy                         to receive sedatives. The heart rate, respiratory                         rate, oxygen saturations, blood pressure, adequacy of                         pulmonary ventilation, and response to care were                         monitored throughout the procedure. The physical                         status of the patient was re-assessed after the                         procedure.                        After obtaining informed consent, the endoscope was  passed under direct vision. Throughout the procedure,                         the patient's blood pressure, pulse, and oxygen                         saturations were monitored continuously. The Endoscope                         was introduced through the mouth, and advanced to the                         second part of duodenum. The upper GI endoscopy was                         accomplished without difficulty. The patient tolerated                         the procedure well. Findings:      The duodenal bulb and second portion of the duodenum were normal.      A small hiatal hernia was present.      The entire examined stomach was normal.  Biopsies were taken with a cold       forceps for Helicobacter pylori testing.      The cardia and gastric fundus were normal on retroflexion.      Esophagogastric landmarks were identified: the gastroesophageal junction       was found at 39 cm from the incisors.      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 Impression:            - Normal duodenal bulb and second portion of the                         duodenum.                        - Small hiatal hernia.                        - Normal stomach. Biopsied.                        - Esophagogastric landmarks identified.                        - Normal gastroesophageal junction and esophagus. Recommendation:        - Await pathology results.                        - Proceed with colonoscopy as scheduled                        See colonoscopy report                        - Follow an antireflux regimen.                        - Use Prilosec (omeprazole) 20 mg PO BID. Procedure Code(s):     ---  Professional ---                        [phone], Esophagogastroduodenoscopy, flexible,                         transoral; with biopsy, single or multiple Diagnosis Code(s):     --- Professional ---                        K44.9, Diaphragmatic hernia without obstruction or                         gangrene                        Z13.810, Encounter for screening for upper                         gastrointestinal disorder                        D50.9, Iron deficiency anemia, unspecified                        K21.9, Gastro-esophageal reflux disease without                         esophagitis CPT copyright 2019 American Medical Association. All rights reserved. The codes documented in this report are preliminary and upon coder review may  be revised to meet current compliance requirements. Dr. Ulyess Mort Lin Landsman MD, MD 08/16/2019 8:25:02 AM This report has been signed  electronically. Number of Addenda: 0 Note Initiated On: 08/16/2019 8:09 AM Estimated Blood Loss:  Estimated blood loss: none.      [facility]

## 2019-08-16 NOTE — Telephone Encounter (Signed)
-----   Message from Lin Landsman, MD sent at 08/16/2019  8:12 AM EDT ----- Regarding: Re: Referral to hematology He was supposed to be referred to hematology for IDA Can you make sure  Thanks RV

## 2019-08-16 NOTE — Telephone Encounter (Signed)
Referred to hematology.

## 2019-08-17 ENCOUNTER — Telehealth: Payer: Self-pay | Admitting: General Practice

## 2019-08-17 ENCOUNTER — Encounter: Payer: Self-pay | Admitting: Gastroenterology

## 2019-08-17 LAB — SURGICAL PATHOLOGY

## 2019-08-17 NOTE — Telephone Encounter (Signed)
Patient called but was not home at the time of call. Spoke with pt's wife, Cristian Martin who is on the [place]. Pt's wife notified of potential risk of E-coli contamination of the [place] water supply during procedure at [facility]. Pt's wife verbalized understanding. No additional questions or concerns voiced at this time.

## 2019-08-18 ENCOUNTER — Encounter: Payer: Self-pay | Admitting: Gastroenterology

## 2019-08-20 ENCOUNTER — Encounter: Payer: No Typology Code available for payment source | Admitting: *Deleted

## 2019-08-20 ENCOUNTER — Other Ambulatory Visit: Payer: Self-pay

## 2019-08-20 DIAGNOSIS — Z951 Presence of aortocoronary bypass graft: Secondary | ICD-10-CM

## 2019-08-20 NOTE — Progress Notes (Signed)
Daily Session Note  Patient Details  Name: Cristian Martin MRN: [id number] Date of Birth: Jan 18, 1946 Referring Provider:     Cardiac Rehab from 07/04/2019 in [facility] Cardiac and Pulmonary Rehab  Referring Provider [name], MD   [facility] (local cardiologist- Dr. Arida)]      Encounter Date: 08/20/2019  Check In:  Session Check In - 08/20/19 1533      Check-In   Supervising physician immediately available to respond to emergencies See telemetry face sheet for immediately available ER MD    Location [facility]-Cardiac & Pulmonary Rehab    Staff Present Renita Papa, RN Margurite Auerbach, MS Exercise Physiologist;Kelly Amedeo Plenty, BS, ACSM CEP, Exercise Physiologist    Virtual Visit No    Medication changes reported     No    Fall or balance concerns reported    No    Warm-up and Cool-down Performed on first and last piece of equipment    Resistance Training Performed Yes    VAD Patient? No    PAD/SET Patient? No      Pain Assessment   Currently in Pain? No/denies              Social History   Tobacco Use  Smoking Status Never Smoker  Smokeless Tobacco Never Used    Goals Met:  Independence with exercise equipment Exercise tolerated well No report of cardiac concerns or symptoms Strength training completed today  Goals Unmet:  Not Applicable  Comments: Pt able to follow exercise prescription today without complaint.  Will continue to monitor for progression.    Dr. Emily Filbert is Medical Director for [facility].

## 2019-08-21 ENCOUNTER — Inpatient Hospital Stay: Payer: PPO

## 2019-08-21 ENCOUNTER — Inpatient Hospital Stay: Payer: PPO | Attending: Oncology | Admitting: Oncology

## 2019-08-21 ENCOUNTER — Encounter: Payer: Self-pay | Admitting: Oncology

## 2019-08-21 VITALS — BP 148/81 | HR 99 | Temp 97.8°F | Wt 213.2 lb

## 2019-08-21 DIAGNOSIS — D509 Iron deficiency anemia, unspecified: Secondary | ICD-10-CM | POA: Insufficient documentation

## 2019-08-21 DIAGNOSIS — C61 Malignant neoplasm of prostate: Secondary | ICD-10-CM | POA: Insufficient documentation

## 2019-08-21 DIAGNOSIS — I10 Essential (primary) hypertension: Secondary | ICD-10-CM | POA: Diagnosis not present

## 2019-08-21 DIAGNOSIS — E119 Type 2 diabetes mellitus without complications: Secondary | ICD-10-CM | POA: Insufficient documentation

## 2019-08-21 NOTE — Progress Notes (Signed)
Hematology/Oncology Consult note [facility] Telephone:([phone] [phone]  Patient Care Team: [place] as PCP - General ([place]) Wellington Hampshire, MD as PCP - Cardiology (Cardiology)   Name of the patient: Cristian Martin  [id number]  02-Dec-1945    Reason for referral-iron deficiency anemia   Referring physician-Dr. Marius Ditch  Date of visit: 08/21/19   History of presenting illness- Patient is a 74 year old male with a past medical history significant for hypertension, diabetes among other medical problems.  He was recently seen by Dr. Marius Ditch for his history of GERD as well as abdominal bloating.  As a part of his work-up he underwent blood work which showed hemoglobin of 10.3 with MCV of 86.  White count and platelets were normal.  He was noted to have a low ferritin of 15.  Patient has recently undergone prostate surgery with radioactive seed implantation for prostate cancer as well as triple bypass.  Reports that his fatigue is slowly getting better after his bypass surgery.  He recently started taking oral iron about 3 weeks ago.  He underwent EGD and colonoscopyOn 08/16/2019.  EGD showed a small hiatal hernia within normal stomach and duodenal bulb normal-appearing esophagus and GE junction.  Colonoscopy showed multiple polyps and a repeat colonoscopy in 3 years was recommended.  Biopsies were all negative for malignancy.  ECOG PS- 1  Pain scale- 0   Review of systems- Review of Systems  Constitutional: Positive for malaise/fatigue. Negative for chills, fever and weight loss.  HENT: Negative for congestion, ear discharge and nosebleeds.   Eyes: Negative for blurred vision.  Respiratory: Negative for cough, hemoptysis, sputum production, shortness of breath and wheezing.   Cardiovascular: Negative for chest pain, palpitations, orthopnea and claudication.  Gastrointestinal: Negative for abdominal pain, blood in stool, constipation,  diarrhea, heartburn, melena, nausea and vomiting.  Genitourinary: Negative for dysuria, flank pain, frequency, hematuria and urgency.  Musculoskeletal: Negative for back pain, joint pain and myalgias.  Skin: Negative for rash.  Neurological: Negative for dizziness, tingling, focal weakness, seizures, weakness and headaches.  Endo/Heme/Allergies: Does not bruise/bleed easily.  Psychiatric/Behavioral: Negative for depression and suicidal ideas. The patient does not have insomnia.     Allergies  Allergen Reactions  . Ace Inhibitors Other (See Comments)    Hyperkalemia.    Patient Active Problem List   Diagnosis Date Noted  . Microcytic anemia   . Iron deficiency anemia   . 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 Coronary artery disease 04/30/2019  . Unstable angina ([place]) 04/27/2019  . Malignant neoplasm of prostate ([place]) 04/09/2018  . GERD (gastroesophageal reflux disease) 04/07/2018  . Hx of colonic polyps 04/07/2018  . Benign essential hypertension 12/02/2015  . Hyperlipidemia, mixed 05/02/2015  . Chronic painful diabetic neuropathy ([place]) 03/13/2015  . DM (diabetes mellitus) type II controlled with renal manifestation ([place]) 08/08/2013     Past Medical History:  Diagnosis Date  . Agent orange exposure   . Coronary artery disease    a. 04/2019 Cath: LM 99, LAD 80p, D2 70, LCX 50ost/p, RCA 70p, EF 55-65%; b. 04/2019 CABG x 3 (LIMA to LAD, SVG to OM1, SVG to RCA).  . Diabetic peripheral neuropathy ([place])   . Diastolic dysfunction    a 04/2019 Echo: EF 55-60%, mod conc LVH, Gr1 DD. Nl RV fxn. Mild Ao sclerosis w/o stenosis.  Marland Kitchen GERD (gastroesophageal reflux disease)   . Hyperlipidemia   . Hyperplasia of prostate with lower urinary tract symptoms (LUTS)   .  Hypertension   . Prostate cancer [facility]) urologist-- dr ottelin/  oncologist-- dr Tammi Klippel   dx 02-21-2018  -- Stage T1c, Gleason 3+4  . Type 2 diabetes mellitus treated with insulin ([place])    followed by [facility] in [place]  . Wears dentures      upper   . Wears glasses    "wears to help double vision"     Past Surgical History:  Procedure Laterality Date  . COLONOSCOPY WITH PROPOFOL N/A 08/16/2019   Procedure: COLONOSCOPY WITH PROPOFOL;  Surgeon: Lin Landsman, MD;  Location: [facility] ENDOSCOPY;  Service: Gastroenterology;  Laterality: N/A;  . CORONARY ARTERY BYPASS GRAFT N/A 04/30/2019   Procedure: CORONARY ARTERY BYPASS GRAFTING (CABG) TIMES THREE USING LEFT INTERNAL MAMMARY AND RIGHT GREATER SAPHENOUS VEIN HARVESTED ENDOSCOPICALLY. MAMMARY ARTERY TO LAD, SAPHENOUS VEIN GRAFT TO  OM1, SAPHENOUS VEING GRAFT TO RIGHT. BIOPSY OF MEDIASTINAL TISSUE PLACEMENT OF RIGHT FEMORAL A-LINE;  Surgeon: Grace Isaac, MD;  Location: [place];  Service: Open Heart Surgery;  Laterality: N/A;  . CYSTOSCOPY N/A 07/14/2018   Procedure: CYSTOSCOPY;  Surgeon: Kathie Rhodes, MD;  Location: [facility];  Service: Urology;  Laterality: N/A;  no seeds in bladder per Dr Karsten Ro  . ESOPHAGOGASTRODUODENOSCOPY (EGD) WITH PROPOFOL N/A 08/16/2019   Procedure: ESOPHAGOGASTRODUODENOSCOPY (EGD) WITH PROPOFOL;  Surgeon: Lin Landsman, MD;  Location: [place];  Service: Gastroenterology;  Laterality: N/A;  . LEFT HEART CATH AND CORONARY ANGIOGRAPHY N/A 04/27/2019   Procedure: LEFT HEART CATH AND CORONARY ANGIOGRAPHY;  Surgeon: Wellington Hampshire, MD;  Location: [place] CV LAB;  Service: Cardiovascular;  Laterality: N/A;  . PROSTATE BIOPSY  02-21-2018  dr Karsten Ro in office  . RADIOACTIVE SEED IMPLANT N/A 07/14/2018   Procedure: RADIOACTIVE SEED IMPLANT/BRACHYTHERAPY IMPLANT;  Surgeon: Kathie Rhodes, MD;  Location: [facility];  Service: Urology;  Laterality: N/A;  77 seeds   . SHOULDER ARTHROSCOPY Left 2018   in [place]   "remove spur"  . SPACE OAR INSTILLATION N/A 07/14/2018   Procedure: SPACE OAR INSTILLATION;  Surgeon: Kathie Rhodes, MD;  Location: [facility];  Service: Urology;  Laterality: N/A;  . TEE  WITHOUT CARDIOVERSION N/A 04/30/2019   Procedure: TRANSESOPHAGEAL ECHOCARDIOGRAM (TEE);  Surgeon: Grace Isaac, MD;  Location: [place];  Service: Open Heart Surgery;  Laterality: N/A;    Social History   Socioeconomic History  . Marital status: Married    Spouse name: Not on file  . Number of children: Not on file  . Years of education: Not on file  . Highest education level: Not on file  Occupational History  . Not on file  Tobacco Use  . Smoking status: Never Smoker  . Smokeless tobacco: Never Used  Vaping Use  . Vaping Use: Never used  Substance and Sexual Activity  . Alcohol use: Not Currently  . Drug use: Never  . Sexual activity: Not Currently  Other Topics Concern  . Not on file  Social History Narrative  . Not on file   Social Determinants of Health   Financial Resource Strain:   . Difficulty of Paying Living Expenses:   Food Insecurity:   . Worried About Charity fundraiser in the Last Year:   . Arboriculturist in the Last Year:   Transportation Needs:   . Film/video editor (Medical):   Marland Kitchen Lack of Transportation (Non-Medical):   Physical Activity:   . Days of Exercise per Week:   . Minutes of  Exercise per Session:   Stress:   . Feeling of Stress :   Social Connections:   . Frequency of Communication with Friends and Family:   . Frequency of Social Gatherings with Friends and Family:   . Attends Religious Services:   . Active Member of Clubs or Organizations:   . Attends Archivist Meetings:   Marland Kitchen Marital Status:   Intimate Partner Violence:   . Fear of Current or Ex-Partner:   . Emotionally Abused:   Marland Kitchen Physically Abused:   . Sexually Abused:      Family History  Problem Relation Age of Onset  . Stroke Mother   . Diabetes Father   . Hypertension Father   . Diabetes Sister   . Cancer Neg Hx      Current Outpatient Medications:  .  acetaminophen (TYLENOL) 325 MG tablet, Take 2 tablets (650 mg total) by mouth every 6 (six) hours  as needed for mild pain., Disp:  , Rfl:  .  amLODipine (NORVASC) 10 MG tablet, Take 10 mg by mouth daily., Disp: , Rfl:  .  aspirin EC 81 MG tablet, Take 1 tablet (81 mg total) by mouth daily. Swallow whole., Disp: 30 tablet, Rfl: 11 .  calcium carbonate (TUMS - DOSED IN MG ELEMENTAL CALCIUM) 500 MG chewable tablet, Chew 2-3 tablets by mouth daily as needed for indigestion or heartburn., Disp: , Rfl:  .  carvedilol (COREG) 6.25 MG tablet, Take 3.125 mg by mouth 2 (two) times daily with a meal., Disp: , Rfl:  .  cloNIDine (CATAPRES) 0.3 MG tablet, Take 1 tablet (0.3 mg total) by mouth 2 (two) times daily., Disp: , Rfl:  .  doxazosin (CARDURA) 4 MG tablet, Take 2 mg by mouth at bedtime. , Disp: , Rfl:  .  DULoxetine (CYMBALTA) 60 MG capsule, Take 60 mg by mouth daily., Disp: , Rfl:  .  famotidine (PEPCID) 20 MG tablet, Take 20 mg by mouth 2 (two) times daily., Disp: , Rfl:  .  guaiFENesin (MUCINEX) 600 MG 12 hr tablet, Take 1 tablet (600 mg total) by mouth 2 (two) times daily as needed for cough or to loosen phlegm., Disp: , Rfl:  .  insulin aspart protamine - aspart (NOVOLOG MIX 70/30 FLEXPEN) (70-30) 100 UNIT/ML FlexPen, Inject 0.2 mLs (20 Units total) into the skin 2 (two) times daily before a meal. As the patient and I discussed, start with a lower dose of Insulin as he has not been eating well. Titrate Insulin upward to dose taken prior to surgery (60 units bid) as tolerates., Disp: 15 mL, Rfl: 11 .  Iron-FA-B Cmp-C-Biot-Probiotic (FUSION PLUS) CAPS, Take 1 tablet by mouth daily., Disp: 30 capsule, Rfl: 2 .  losartan (COZAAR) 50 MG tablet, Take 1 tablet (50 mg total) by mouth daily., Disp: 30 tablet, Rfl: 1 .  metFORMIN (GLUCOPHAGE-XR) 500 MG 24 hr tablet, Take 1,000 mg by mouth in the morning and at bedtime., Disp: , Rfl:  .  Multiple Vitamins-Minerals (MULTIVITAMIN ADULT PO), Take 1 tablet by mouth daily. , Disp: , Rfl:  .  omeprazole (PRILOSEC) 20 MG capsule, Take 20 mg by mouth 2 (two) times  daily. , Disp: , Rfl:  .  rosuvastatin (CRESTOR) 40 MG tablet, Take 1 tablet (40 mg total) by mouth daily., Disp: 30 tablet, Rfl: 1 .  Semaglutide (OZEMPIC, 1 MG/DOSE, [place]), Inject 1 mg into the skin every Sunday. , Disp: , Rfl:  .  Trospium Chloride 60 MG CP24, Take 60  mg by mouth daily., Disp: , Rfl:    Physical exam:  Vitals:   08/21/19 1322  BP: (!) 148/81  Pulse: 99  Temp: 97.8 F (36.6 C)  TempSrc: Oral  SpO2: 98%  Weight: 213 lb 3.2 oz (96.7 kg)   Physical Exam Constitutional:      General: He is not in acute distress. Cardiovascular:     Rate and Rhythm: Normal rate and regular rhythm.     Heart sounds: Normal heart sounds.  Pulmonary:     Effort: Pulmonary effort is normal.     Breath sounds: Normal breath sounds.  Abdominal:     General: Bowel sounds are normal.     Palpations: Abdomen is soft.  Skin:    General: Skin is warm and dry.  Neurological:     Mental Status: He is alert and oriented to person, place, and time.        CMP Latest Ref Rng & Units 05/23/2019  Glucose 65 - 99 mg/dL 99  BUN 8 - 27 mg/dL 22  Creatinine 0.76 - 1.27 mg/dL 0.85  Sodium 134 - 144 mmol/L 140  Potassium 3.5 - 5.2 mmol/L 4.9  Chloride 96 - 106 mmol/L 101  CO2 20 - 29 mmol/L 21  Calcium 8.6 - 10.2 mg/dL 9.2  Total Protein 6.5 - 8.1 g/dL -  Total Bilirubin 0.3 - 1.2 mg/dL -  Alkaline Phos 38 - 126 U/L -  AST 15 - 41 U/L -  ALT 0 - 44 U/L -   CBC Latest Ref Rng & Units 05/23/2019  WBC 3.4 - 10.8 x10E3/uL 7.8  Hemoglobin 13.0 - 17.7 g/dL 11.0(L)  Hematocrit 37.5 - 51.0 % 34.8(L)  Platelets 150 - 450 x10E3/uL 397    Assessment and plan- Patient is a 74 y.o. male referred for iron deficiency anemia  Patient's most recent hemoglobin was mildly low at 11 with ferritin of 15 suggestive of iron deficiency.  Moreover patient has recently gone through prostate surgery as well as triple bypass.  Also underwent EGD and colonoscopy which did not show any active bleeding.  We  discussed oral versus IV iron.  Patient would like to try oral iron a little longer before trying IV iron.  He will continue to take oral iron once or twice a day as tolerated.  I will check CBC ferritin iron studies B12 folate and TSH in 2 months and see him in person or for a video visit   Thank you for this kind referral and the opportunity to participate in the care of this  Patient   Visit Diagnosis 1. Iron deficiency anemia, unspecified iron deficiency anemia type     Dr. Randa Evens, MD, MPH [facility] [phone] 08/21/2019 4:03 PM

## 2019-08-22 ENCOUNTER — Encounter: Payer: No Typology Code available for payment source | Admitting: *Deleted

## 2019-08-22 ENCOUNTER — Other Ambulatory Visit: Payer: Self-pay

## 2019-08-22 DIAGNOSIS — Z951 Presence of aortocoronary bypass graft: Secondary | ICD-10-CM

## 2019-08-22 NOTE — Progress Notes (Signed)
Daily Session Note  Patient Details  Name: Cristian Martin MRN: [id number] Date of Birth: 12-Jan-1946 Referring Provider:     Cardiac Rehab from 07/04/2019 in [facility] Cardiac and Pulmonary Rehab  Referring Provider [name], MD   [facility] (local cardiologist- Dr. Arida)]      Encounter Date: 08/22/2019  Check In:  Session Check In - 08/22/19 1537      Check-In   Supervising physician immediately available to respond to emergencies See telemetry face sheet for immediately available ER MD    Location [facility]-Cardiac & Pulmonary Rehab    Staff Present Renita Papa, RN Margurite Auerbach, MS Exercise Physiologist;Amanda Oletta Darter, BA, ACSM CEP, Exercise Physiologist    Virtual Visit No    Medication changes reported     No    Fall or balance concerns reported    No    Warm-up and Cool-down Performed on first and last piece of equipment    Resistance Training Performed Yes    VAD Patient? No    PAD/SET Patient? No      Pain Assessment   Currently in Pain? No/denies              Social History   Tobacco Use  Smoking Status Never Smoker  Smokeless Tobacco Never Used    Goals Met:  Independence with exercise equipment Exercise tolerated well No report of cardiac concerns or symptoms Strength training completed today  Goals Unmet:  Not Applicable  Comments: Pt able to follow exercise prescription today without complaint.  Will continue to monitor for progression.    Dr. Emily Filbert is Medical Director for [place] and [facility].

## 2019-08-23 ENCOUNTER — Encounter: Payer: No Typology Code available for payment source | Admitting: *Deleted

## 2019-08-23 ENCOUNTER — Other Ambulatory Visit: Payer: Self-pay

## 2019-08-23 DIAGNOSIS — Z951 Presence of aortocoronary bypass graft: Secondary | ICD-10-CM | POA: Diagnosis not present

## 2019-08-23 NOTE — Progress Notes (Signed)
Daily Session Note  Patient Details  Name: ZAVIYAR RAHAL MRN: [id number] Date of Birth: 1945-02-09 Referring Provider:     Cardiac Rehab from 07/04/2019 in [facility] Cardiac and Pulmonary Rehab  Referring Provider [name], MD   [facility] (local cardiologist- Dr. Arida)]      Encounter Date: 08/23/2019  Check In:  Session Check In - 08/23/19 1548      Check-In   Supervising physician immediately available to respond to emergencies See telemetry face sheet for immediately available ER MD    Location [facility]-Cardiac & Pulmonary Rehab    Staff Present Renita Papa, RN BSN;Joseph Hood RCP,RRT,BSRT;Amanda Oletta Darter, [place], ACSM CEP, Exercise Physiologist    Virtual Visit No    Medication changes reported     No    Fall or balance concerns reported    No    Warm-up and Cool-down Performed on first and last piece of equipment    Resistance Training Performed Yes    VAD Patient? No    PAD/SET Patient? No      Pain Assessment   Currently in Pain? No/denies              Social History   Tobacco Use  Smoking Status Never Smoker  Smokeless Tobacco Never Used    Goals Met:  Independence with exercise equipment Exercise tolerated well No report of cardiac concerns or symptoms Strength training completed today  Goals Unmet:  Not Applicable  Comments: Pt able to follow exercise prescription today without complaint.  Will continue to monitor for progression.    Dr. Emily Filbert is Medical Director for [place] and [facility].

## 2019-09-03 ENCOUNTER — Encounter: Payer: No Typology Code available for payment source | Attending: Internal Medicine | Admitting: *Deleted

## 2019-09-03 ENCOUNTER — Other Ambulatory Visit: Payer: Self-pay

## 2019-09-03 DIAGNOSIS — E785 Hyperlipidemia, unspecified: Secondary | ICD-10-CM | POA: Insufficient documentation

## 2019-09-03 DIAGNOSIS — Z794 Long term (current) use of insulin: Secondary | ICD-10-CM | POA: Insufficient documentation

## 2019-09-03 DIAGNOSIS — Z951 Presence of aortocoronary bypass graft: Secondary | ICD-10-CM | POA: Insufficient documentation

## 2019-09-03 DIAGNOSIS — Z7982 Long term (current) use of aspirin: Secondary | ICD-10-CM | POA: Diagnosis not present

## 2019-09-03 DIAGNOSIS — Z79899 Other long term (current) drug therapy: Secondary | ICD-10-CM | POA: Diagnosis not present

## 2019-09-03 DIAGNOSIS — I10 Essential (primary) hypertension: Secondary | ICD-10-CM | POA: Insufficient documentation

## 2019-09-03 DIAGNOSIS — I2510 Atherosclerotic heart disease of native coronary artery without angina pectoris: Secondary | ICD-10-CM | POA: Diagnosis not present

## 2019-09-03 DIAGNOSIS — Z8546 Personal history of malignant neoplasm of prostate: Secondary | ICD-10-CM | POA: Diagnosis not present

## 2019-09-03 DIAGNOSIS — E1142 Type 2 diabetes mellitus with diabetic polyneuropathy: Secondary | ICD-10-CM | POA: Diagnosis not present

## 2019-09-03 DIAGNOSIS — K219 Gastro-esophageal reflux disease without esophagitis: Secondary | ICD-10-CM | POA: Insufficient documentation

## 2019-09-03 NOTE — Progress Notes (Signed)
Daily Session Note  Patient Details  Name: Cristian Martin MRN: [id number] Date of Birth: 1945/12/27 Referring Provider:     Cardiac Rehab from 07/04/2019 in [facility] Cardiac and Pulmonary Rehab  Referring Provider [name], MD   [facility] (local cardiologist- Dr. Arida)]      Encounter Date: 09/03/2019  Check In:  Session Check In - 09/03/19 1534      Check-In   Supervising physician immediately available to respond to emergencies See telemetry face sheet for immediately available ER MD    Location [facility]-Cardiac & Pulmonary Rehab    Staff Present Renita Papa, RN Margurite Auerbach, MS Exercise Physiologist;Kelly Amedeo Plenty, BS, ACSM CEP, Exercise Physiologist    Virtual Visit No    Medication changes reported     No    Fall or balance concerns reported    No    Warm-up and Cool-down Performed on first and last piece of equipment    Resistance Training Performed Yes    VAD Patient? No    PAD/SET Patient? No      Pain Assessment   Currently in Pain? No/denies              Social History   Tobacco Use  Smoking Status Never Smoker  Smokeless Tobacco Never Used    Goals Met:  Independence with exercise equipment Exercise tolerated well No report of cardiac concerns or symptoms Strength training completed today  Goals Unmet:  Not Applicable  Comments: Pt able to follow exercise prescription today without complaint.  Will continue to monitor for progression.    Dr. Emily Filbert is Medical Director for [place] and [facility].

## 2019-09-05 ENCOUNTER — Other Ambulatory Visit: Payer: Self-pay

## 2019-09-05 ENCOUNTER — Encounter: Payer: No Typology Code available for payment source | Admitting: *Deleted

## 2019-09-05 DIAGNOSIS — Z951 Presence of aortocoronary bypass graft: Secondary | ICD-10-CM | POA: Diagnosis not present

## 2019-09-05 NOTE — Progress Notes (Signed)
Daily Session Note  Patient Details  Name: Cristian Martin MRN: [id number] Date of Birth: 06-23-1945 Referring Provider:     Cardiac Rehab from 07/04/2019 in [facility] Cardiac and Pulmonary Rehab  Referring Provider [name], MD   [facility] (local cardiologist- Dr. Arida)]      Encounter Date: 09/05/2019  Check In:  Session Check In - 09/05/19 1537      Check-In   Supervising physician immediately available to respond to emergencies See telemetry face sheet for immediately available ER MD    Location [facility]-Cardiac & Pulmonary Rehab    Staff Present Renita Papa, RN BSN;Joseph Lou [place], [place] Exercise Physiologist;Amanda Oletta Darter, [place], ACSM CEP, Exercise Physiologist    Virtual Visit No    Medication changes reported     No    Fall or balance concerns reported    No    Warm-up and Cool-down Performed on first and last piece of equipment    Resistance Training Performed Yes    VAD Patient? No    PAD/SET Patient? No      Pain Assessment   Currently in Pain? No/denies              Social History   Tobacco Use  Smoking Status Never Smoker  Smokeless Tobacco Never Used    Goals Met:  Independence with exercise equipment Exercise tolerated well No report of cardiac concerns or symptoms Strength training completed today  Goals Unmet:  Not Applicable  Comments: Pt able to follow exercise prescription today without complaint.  Will continue to monitor for progression.    Dr. Emily Filbert is Medical Director for [place] and [facility].

## 2019-09-06 ENCOUNTER — Other Ambulatory Visit: Payer: Self-pay

## 2019-09-06 ENCOUNTER — Encounter: Payer: No Typology Code available for payment source | Admitting: *Deleted

## 2019-09-06 DIAGNOSIS — Z951 Presence of aortocoronary bypass graft: Secondary | ICD-10-CM

## 2019-09-06 NOTE — Progress Notes (Signed)
Daily Session Note  Patient Details  Name: Cristian Martin MRN: [id number] Date of Birth: Jan 12, 1946 Referring Provider:     Cardiac Rehab from 07/04/2019 in [facility] Cardiac and Pulmonary Rehab  Referring Provider [name], MD   [facility] (local cardiologist- Dr. Arida)]      Encounter Date: 09/06/2019  Check In:  Session Check In - 09/06/19 1525      Check-In   Supervising physician immediately available to respond to emergencies See telemetry face sheet for immediately available ER MD    Location [facility]-Cardiac & Pulmonary Rehab    Staff Present Renita Papa, RN BSN;Joseph Hood RCP,RRT,BSRT;Laureen [place], [place], RRT, CPFT    Virtual Visit No    Medication changes reported     No    Fall or balance concerns reported    No    Warm-up and Cool-down Performed on first and last piece of equipment    Resistance Training Performed Yes    VAD Patient? No    PAD/SET Patient? No      Pain Assessment   Currently in Pain? No/denies              Social History   Tobacco Use  Smoking Status Never Smoker  Smokeless Tobacco Never Used    Goals Met:  Independence with exercise equipment Exercise tolerated well No report of cardiac concerns or symptoms Strength training completed today  Goals Unmet:  Not Applicable  Comments: Pt able to follow exercise prescription today without complaint.  Will continue to monitor for progression.    Dr. Emily Filbert is Medical Director for [place] and [facility].

## 2019-09-10 ENCOUNTER — Encounter: Payer: No Typology Code available for payment source | Admitting: *Deleted

## 2019-09-10 ENCOUNTER — Other Ambulatory Visit: Payer: Self-pay

## 2019-09-10 DIAGNOSIS — Z951 Presence of aortocoronary bypass graft: Secondary | ICD-10-CM

## 2019-09-10 NOTE — Progress Notes (Signed)
Daily Session Note  Patient Details  Name: Cristian Martin MRN: [id number] Date of Birth: 1946/01/04 Referring Provider:     Cardiac Rehab from 07/04/2019 in [facility] Cardiac and Pulmonary Rehab  Referring Provider [name], MD   [facility] (local cardiologist- Dr. Arida)]      Encounter Date: 09/10/2019  Check In:  Session Check In - 09/10/19 1527      Check-In   Supervising physician immediately available to respond to emergencies See telemetry face sheet for immediately available ER MD    Location [facility]-Cardiac & Pulmonary Rehab    Staff Present Renita Papa, RN Margurite Auerbach, MS Exercise Physiologist;Kelly Amedeo Plenty, BS, ACSM CEP, Exercise Physiologist    Virtual Visit No    Medication changes reported     No    Fall or balance concerns reported    No    Warm-up and Cool-down Performed on first and last piece of equipment    Resistance Training Performed Yes    VAD Patient? No    PAD/SET Patient? No      Pain Assessment   Currently in Pain? No/denies              Social History   Tobacco Use  Smoking Status Never Smoker  Smokeless Tobacco Never Used    Goals Met:  Independence with exercise equipment Exercise tolerated well No report of cardiac concerns or symptoms Strength training completed today  Goals Unmet:  Not Applicable  Comments: Pt able to follow exercise prescription today without complaint.  Will continue to monitor for progression.    Dr. Emily Filbert is Medical Director for [place] and [facility].

## 2019-09-12 ENCOUNTER — Other Ambulatory Visit: Payer: Self-pay

## 2019-09-12 ENCOUNTER — Encounter: Payer: Self-pay | Admitting: *Deleted

## 2019-09-12 ENCOUNTER — Encounter: Payer: No Typology Code available for payment source | Admitting: *Deleted

## 2019-09-12 DIAGNOSIS — Z951 Presence of aortocoronary bypass graft: Secondary | ICD-10-CM

## 2019-09-12 NOTE — Progress Notes (Signed)
Cardiac Individual Treatment Plan  Patient Details  Name: Cristian Martin MRN: [id number] Date of Birth: July 19, 1945 Referring Provider:     Cardiac Rehab from 07/04/2019 in [facility] Cardiac and Pulmonary Rehab  Referring Provider [name], MD   [facility] (local cardiologist- Dr. Arida)]      Initial Encounter Date:    Cardiac Rehab from 07/04/2019 in [facility] Cardiac and Pulmonary Rehab  Date 07/04/19      Visit Diagnosis: S/P CABG x 3  Patient's Home Medications on Admission:  Current Outpatient Medications:    acetaminophen (TYLENOL) 325 MG tablet, Take 2 tablets (650 mg total) by mouth every 6 (six) hours as needed for mild pain., Disp:  , Rfl:    amLODipine (NORVASC) 10 MG tablet, Take 10 mg by mouth daily., Disp: , Rfl:    aspirin EC 81 MG tablet, Take 1 tablet (81 mg total) by mouth daily. Swallow whole., Disp: 30 tablet, Rfl: 11   calcium carbonate (TUMS - DOSED IN MG ELEMENTAL CALCIUM) 500 MG chewable tablet, Chew 2-3 tablets by mouth daily as needed for indigestion or heartburn., Disp: , Rfl:    carvedilol (COREG) 6.25 MG tablet, Take 3.125 mg by mouth 2 (two) times daily with a meal., Disp: , Rfl:    cloNIDine (CATAPRES) 0.3 MG tablet, Take 1 tablet (0.3 mg total) by mouth 2 (two) times daily., Disp: , Rfl:    doxazosin (CARDURA) 4 MG tablet, Take 2 mg by mouth at bedtime. , Disp: , Rfl:    DULoxetine (CYMBALTA) 60 MG capsule, Take 60 mg by mouth daily., Disp: , Rfl:    famotidine (PEPCID) 20 MG tablet, Take 20 mg by mouth 2 (two) times daily., Disp: , Rfl:    guaiFENesin (MUCINEX) 600 MG 12 hr tablet, Take 1 tablet (600 mg total) by mouth 2 (two) times daily as needed for cough or to loosen phlegm., Disp: , Rfl:    insulin aspart protamine - aspart (NOVOLOG MIX 70/30 FLEXPEN) (70-30) 100 UNIT/ML FlexPen, Inject 0.2 mLs (20 Units total) into the skin 2 (two) times daily before a meal. As the patient and I discussed, start with a lower dose of Insulin as he has not been eating  well. Titrate Insulin upward to dose taken prior to surgery (60 units bid) as tolerates., Disp: 15 mL, Rfl: 11   Iron-FA-B Cmp-C-Biot-Probiotic (FUSION PLUS) CAPS, Take 1 tablet by mouth daily., Disp: 30 capsule, Rfl: 2   losartan (COZAAR) 50 MG tablet, Take 1 tablet (50 mg total) by mouth daily., Disp: 30 tablet, Rfl: 1   metFORMIN (GLUCOPHAGE-XR) 500 MG 24 hr tablet, Take 1,000 mg by mouth in the morning and at bedtime., Disp: , Rfl:    Multiple Vitamins-Minerals (MULTIVITAMIN ADULT PO), Take 1 tablet by mouth daily. , Disp: , Rfl:    omeprazole (PRILOSEC) 20 MG capsule, Take 20 mg by mouth 2 (two) times daily. , Disp: , Rfl:    rosuvastatin (CRESTOR) 40 MG tablet, Take 1 tablet (40 mg total) by mouth daily., Disp: 30 tablet, Rfl: 1   Semaglutide (OZEMPIC, 1 MG/DOSE, [place]), Inject 1 mg into the skin every Sunday. , Disp: , Rfl:    Trospium Chloride 60 MG CP24, Take 60 mg by mouth daily., Disp: , Rfl:   Past Medical History: Past Medical History:  Diagnosis Date   Agent orange exposure    Coronary artery disease    a. 04/2019 Cath: LM 99, LAD 80p, D2 70, LCX 50ost/p, RCA 70p, EF 55-65%; b. 04/2019  CABG x 3 (LIMA to LAD, SVG to OM1, SVG to RCA).   Diabetic peripheral neuropathy (HCC)    Diastolic dysfunction    a 04/2019 Echo: EF 55-60%, mod conc LVH, Gr1 DD. Nl RV fxn. Mild Ao sclerosis w/o stenosis.   GERD (gastroesophageal reflux disease)    Hyperlipidemia    Hyperplasia of prostate with lower urinary tract symptoms (LUTS)    Hypertension    Prostate cancer [facility]) urologist-- dr ottelin/  oncologist-- dr Tammi Klippel   dx 02-21-2018  -- Stage T1c, Gleason 3+4   Type 2 diabetes mellitus treated with insulin ([place])    [facility] [place]   Wears dentures    upper    Wears glasses    "wears to help double vision"    Tobacco Use: Social History   Tobacco Use  Smoking Status Never Smoker  Smokeless Tobacco Never Used    Labs: Recent Review Flowsheet Data     Labs for ITP Cardiac and Pulmonary Rehab Latest Ref Rng & Units 04/30/2019 04/30/2019 04/30/2019 04/30/2019 04/30/2019   Cholestrol 0 - 200 mg/dL - - - - -   LDLCALC 0 - 99 mg/dL - - - - -   HDL >40 mg/dL - - - - -   Trlycerides <150 mg/dL - - - - -   Hemoglobin A1c 4.8 - 5.6 % - - - - -   PHART 7.35 - 7.45 - 7.327(L) 7.324(L) 7.348(L) 7.365   PCO2ART 32 - 48 mmHg - 40.6 42.5 37.9 38.3   HCO3 20.0 - 28.0 mmol/L - 21.5 22.3 20.9 21.9   TCO2 22 - 32 mmol/L _0 ACIDBASEDEF 0.0 - 2.0 mmol/L - 5.0(H) 4.0(H) 4.0(H) 3.0(H)   O2SAT % - 91.0 90.0 93.0 94.0       Exercise Target Goals: Exercise Program Goal: Individual exercise prescription set using results from initial 6 min walk test and THRR while considering  patients activity barriers and safety.   Exercise Prescription Goal: Initial exercise prescription builds to 30-45 minutes a day of aerobic activity, 2-3 days per week.  Home exercise guidelines will be given to patient during program as part of exercise prescription that the participant will acknowledge.   Education: Aerobic Exercise & Resistance Training: - Gives group verbal and written instruction on the various components of exercise. Focuses on aerobic and resistive training programs and the benefits of this training and how to safely progress through these programs..   Education: Exercise & Equipment Safety: - Individual verbal instruction and demonstration of equipment use and safety with use of the equipment.   Cardiac Rehab from 09/05/2019 in [facility] Cardiac and Pulmonary Rehab  Date 06/28/19  Educator [facility]  Instruction Review Code 1- Verbalizes Understanding      Education: Exercise Physiology & General Exercise Guidelines: - Group verbal and written instruction with models to review the exercise physiology of the cardiovascular system and associated critical values. Provides general exercise guidelines with specific guidelines to those with heart or lung disease.      Education: Flexibility, Balance, Mind/Body Relaxation: Provides group verbal/written instruction on the benefits of flexibility and balance training, including mind/body exercise modes such as yoga, pilates and tai chi.  Demonstration and skill practice provided.   Activity Barriers & Risk Stratification:  Activity Barriers & Cardiac Risk Stratification - 07/04/19 1210      Activity Barriers & Cardiac Risk Stratification   Activity Barriers Deconditioning;Muscular Weakness;Shortness of Breath;Other (comment);Balance Concerns;Joint Problems  Comments Neuropathy, left shoulder bone spurs    Cardiac Risk Stratification High           6 Minute Walk:  6 Minute Walk    Row Name 07/04/19 1205         6 Minute Walk   Phase Initial     Distance 1024 feet     Walk Time 6 minutes     # of Rest Breaks 0     MPH 1.94     METS 2.54     RPE 13     VO2 Peak 8.91     Symptoms No     Resting HR 101 bpm     Resting BP 132/64     Resting Oxygen Saturation  97 %     Exercise Oxygen Saturation  during 6 min walk 96 %     Max Ex. HR 121 bpm     Max Ex. BP 158/70     2 Minute Post BP 126/72            Oxygen Initial Assessment:   Oxygen Re-Evaluation:   Oxygen Discharge (Final Oxygen Re-Evaluation):   Initial Exercise Prescription:  Initial Exercise Prescription - 07/04/19 1200      Date of Initial Exercise RX and Referring Provider   Date 07/04/19    Referring Provider [name], MD    [place] (local cardiologist- Dr. Fletcher Anon)     Treadmill   MPH 1.8    Grade 0.5    Minutes 15    METs 2.5      REL-XR   Level 1    Speed 50    Minutes 15    METs 2.5      T5 Nustep   Level 1    SPM 80    Minutes 15    METs 2.5      Prescription Details   Frequency (times per week) 3    Duration Progress to 30 minutes of continuous aerobic without signs/symptoms of physical distress      Intensity   THRR 40-80% of Max Heartrate 119-137    Ratings of Perceived Exertion  11-13    Perceived Dyspnea 0-4      Progression   Progression Continue to progress workloads to maintain intensity without signs/symptoms of physical distress.      Resistance Training   Training Prescription Yes    Weight 4 lb    Reps 10-15           Perform Capillary Blood Glucose checks as needed.  Exercise Prescription Changes:  Exercise Prescription Changes    Row Name 07/04/19 1200 07/12/19 1400 07/23/19 1600 08/08/19 1600 08/20/19 1400     Response to Exercise   Blood Pressure (Admit) 132/64 122/62 118/56 118/66 124/70   Blood Pressure (Exercise) 158/70 154/70 130/78 154/64 158/80   Blood Pressure (Exit) 126/72 130/80 114/64 142/68 126/70   Heart Rate (Admit) 101 bpm 122 bpm 99 bpm 91 bpm 91 bpm   Heart Rate (Exercise) 121 bpm 134 bpm 136 bpm 113 bpm 127 bpm   Heart Rate (Exit) 101 bpm 96 bpm 97 bpm 84 bpm 110 bpm   Oxygen Saturation (Admit) 97 % -- -- -- --   Oxygen Saturation (Exercise) 96 % -- -- -- --   Rating of Perceived Exertion (Exercise) _0 Symptoms _1    Comments Walk Test Results second full day of exercise -- -- --  Duration -- Progress to 30 minutes of  aerobic without signs/symptoms of physical distress Continue with 30 min of aerobic exercise without signs/symptoms of physical distress. Continue with 30 min of aerobic exercise without signs/symptoms of physical distress. Continue with 30 min of aerobic exercise without signs/symptoms of physical distress.   Intensity -- THRR unchanged THRR unchanged THRR unchanged THRR unchanged     Progression   Progression -- Continue to progress workloads to maintain intensity without signs/symptoms of physical distress. Continue to progress workloads to maintain intensity without signs/symptoms of physical distress. Continue to progress workloads to maintain intensity without signs/symptoms of physical distress. Continue to progress workloads to maintain intensity without  signs/symptoms of physical distress.   Average METs -- 2.37 3.3 2.4 2.89     Resistance Training   Training Prescription -- Yes Yes Yes Yes   Weight -- 4 lb 4 lb 4 lb 4 lb   Reps -- 10-15 10-15 10-15 10-15     Interval Training   Interval Training -- No No No No     Treadmill   MPH -- 1.8 1.8 1.8 2   Grade -- 0.5 0.5 0.5 0.5   Minutes -- _0 METs -- 2.5 2.5 2.5 2.67     REL-XR   Level -- 1 1 -- 1   Minutes -- 15 15 -- 15   METs -- 2.7 4.1 -- 3.7     T5 Nustep   Level -- _1 SPM -- -- -- 80 --   Minutes -- _2 METs -- 1.9 -- 2 2.3   Row Name 09/05/19 1400             Response to Exercise   Blood Pressure (Admit) 132/80       Blood Pressure (Exercise) 146/72       Blood Pressure (Exit) 130/70       Heart Rate (Admit) 95 bpm       Heart Rate (Exercise) 127 bpm       Heart Rate (Exit) 113 bpm       Rating of Perceived Exertion (Exercise) 12       Symptoms none       Duration Continue with 30 min of aerobic exercise without signs/symptoms of physical distress.       Intensity THRR unchanged         Progression   Progression Continue to progress workloads to maintain intensity without signs/symptoms of physical distress.       Average METs 2.4         Resistance Training   Training Prescription Yes       Weight 4 lb       Reps 10-15         Interval Training   Interval Training No         Treadmill   MPH 2       Grade 0.5       Minutes 15       METs 2.67         T5 Nustep   Level 1       SPM 80       Minutes 15       METs 2.1              Exercise Comments:   Exercise Goals and Review:  Exercise Goals    Row Name 07/04/19 1230  Exercise Goals   Increase Physical Activity Yes       Intervention Provide advice, education, support and counseling about physical activity/exercise needs.;Develop an individualized exercise prescription for aerobic and resistive training based on initial evaluation findings,  risk stratification, comorbidities and participant's personal goals.       Expected Outcomes Short Term: Attend rehab on a regular basis to increase amount of physical activity.;Long Term: Exercising regularly at least 3-5 days a week.;Long Term: Add in home exercise to make exercise part of routine and to increase amount of physical activity.       Increase Strength and Stamina Yes       Intervention Provide advice, education, support and counseling about physical activity/exercise needs.;Develop an individualized exercise prescription for aerobic and resistive training based on initial evaluation findings, risk stratification, comorbidities and participant's personal goals.       Expected Outcomes Short Term: Increase workloads from initial exercise prescription for resistance, speed, and METs.;Short Term: Perform resistance training exercises routinely during rehab and add in resistance training at home;Long Term: Improve cardiorespiratory fitness, muscular endurance and strength as measured by increased METs and functional capacity (6MWT)       Able to understand and use rate of perceived exertion (RPE) scale Yes       Intervention Provide education and explanation on how to use RPE scale       Expected Outcomes Short Term: Able to use RPE daily in rehab to express subjective intensity level;Long Term:  Able to use RPE to guide intensity level when exercising independently       Able to understand and use Dyspnea scale Yes       Intervention Provide education and explanation on how to use Dyspnea scale       Expected Outcomes Short Term: Able to use Dyspnea scale daily in rehab to express subjective sense of shortness of breath during exertion;Long Term: Able to use Dyspnea scale to guide intensity level when exercising independently       Knowledge and understanding of Target Heart Rate Range (THRR) Yes       Intervention Provide education and explanation of THRR including how the numbers were  predicted and where they are located for reference       Expected Outcomes Short Term: Able to state/look up THRR;Long Term: Able to use THRR to govern intensity when exercising independently;Short Term: Able to use daily as guideline for intensity in rehab       Able to check pulse independently Yes       Intervention Provide education and demonstration on how to check pulse in carotid and radial arteries.;Review the importance of being able to check your own pulse for safety during independent exercise       Expected Outcomes Short Term: Able to explain why pulse checking is important during independent exercise;Long Term: Able to check pulse independently and accurately       Understanding of Exercise Prescription Yes       Intervention Provide education, explanation, and written materials on patient's individual exercise prescription       Expected Outcomes Short Term: Able to explain program exercise prescription;Long Term: Able to explain home exercise prescription to exercise independently              Exercise Goals Re-Evaluation :  Exercise Goals Re-Evaluation    Row Name 07/12/19 1441 07/23/19 1540 08/08/19 1617 08/20/19 1431 09/03/19 1609     Exercise Goal Re-Evaluation   Exercise Goals Review  Increase Physical Activity;Increase Strength and Stamina;Understanding of Exercise Prescription Increase Physical Activity;Increase Strength and Stamina;Understanding of Exercise Prescription Increase Physical Activity;Increase Strength and Stamina;Understanding of Exercise Prescription Increase Physical Activity;Increase Strength and Stamina;Understanding of Exercise Prescription Increase Physical Activity;Increase Strength and Stamina;Understanding of Exercise Prescription   Comments Lesly Rubenstein is off to a good start in rehab.  He has completed his first two full days of exercise.  We will continue to montior his progress. Bo stated that his main limiting factor is SOB, but has noticed that is has  improved some with exercising. He is still getting into a rhythm with rehab attendence as he went on vacation shortly after he started the program. Lesly Rubenstein has been attending consistently He reaches [facility] most sessions.  Staff will monitor progress. Lesly Rubenstein continues to do well in rehab.  He is now up to 2.0 mph on the treadmill.  We will continue to monitor his progress. Lesly Rubenstein continues to do well in rehab.  He is now up to 2.0 mph on the treadmill.  We will continue to monitor his progress. He reports not working out at home, but has a weighted bar he could use - encouraged to workout outside of rehab to get bst benefit.   Expected Outcomes Short: Continue to attend rehab regularly Long: Continue to follow program prescription Short: attend rehab on e reagular basis. Long: Become independent with an exercise routine. Short: continue to attend consistently Long:  build overall stamina Short: Increase XR workload Long: Continue to improve stamina. Short: workout outside of rehab Long: Continue to improve stamina.          Discharge Exercise Prescription (Final Exercise Prescription Changes):  Exercise Prescription Changes - 09/05/19 1400      Response to Exercise   Blood Pressure (Admit) 132/80    Blood Pressure (Exercise) 146/72    Blood Pressure (Exit) 130/70    Heart Rate (Admit) 95 bpm    Heart Rate (Exercise) 127 bpm    Heart Rate (Exit) 113 bpm    Rating of Perceived Exertion (Exercise) 12    Symptoms none    Duration Continue with 30 min of aerobic exercise without signs/symptoms of physical distress.    Intensity THRR unchanged      Progression   Progression Continue to progress workloads to maintain intensity without signs/symptoms of physical distress.    Average METs 2.4      Resistance Training   Training Prescription Yes    Weight 4 lb    Reps 10-15      Interval Training   Interval Training No      Treadmill   MPH 2    Grade 0.5    Minutes 15    METs 2.67      T5 Nustep    Level 1    SPM 80    Minutes 15    METs 2.1           Nutrition:  Target Goals: Understanding of nutrition guidelines, daily intake of sodium <1561m, cholesterol <2070m calories 30% from fat and 7% or less from saturated fats, daily to have 5 or more servings of fruits and vegetables.  Education: Controlling Sodium/Reading Food Labels -Group verbal and written material supporting the discussion of sodium use in heart healthy nutrition. Review and explanation with models, verbal and written materials for utilization of the food label.   Education: General Nutrition Guidelines/Fats and Fiber: -Group instruction provided by verbal, written material, models and posters to present the general guidelines  for heart healthy nutrition. Gives an explanation and review of dietary fats and fiber.   Biometrics:  Pre Biometrics - 07/04/19 1231      Pre Biometrics   Height 5' 9.9" (1.775 m)    Weight 207 lb 3.2 oz (94 kg)    BMI (Calculated) 29.83    Single Leg Stand 1.9 seconds            Nutrition Therapy Plan and Nutrition Goals:  Nutrition Therapy & Goals - 09/03/19 1549      Nutrition Therapy   Diet Heart healthy, T2DM    Protein (specify units) 75-80g    Fiber 30 grams    Whole Grain Foods 3 servings    Saturated Fats 12 max. grams    Fruits and Vegetables 5 servings/day    Sodium 1.5 grams      Personal Nutrition Goals   Nutrition Goal ST: include vegetables 4x/week LT: breathing improved and would like to increase strength    Comments B: boiled eggs or cereal L: sandwich D: cooking baked chicken or hamburger, goes out 4x/week (spaghetti, BBQ). Pt will cook with canola oil and PAM spray. Pt reports not eating many vegetables. Pt reports his wife cooks vegetables, but he wasn't exposed to it much as a kid. Pt would like to include more vegetables. Pt likes sardines. Dentition problems due to T2DM - eating soft food until he gets his dentures. Discussed heart healthy eating  and T2DM eating- pt reports getting education multiple times from the [place]. Gave pt resources on how to make changes flavorful.      Intervention Plan   Intervention Prescribe, educate and counsel regarding individualized specific dietary modifications aiming towards targeted core components such as weight, hypertension, lipid management, diabetes, heart failure and other comorbidities.;Nutrition handout(s) given to patient.    Expected Outcomes Short Term Goal: Understand basic principles of dietary content, such as calories, fat, sodium, cholesterol and nutrients.;Short Term Goal: A plan has been developed with personal nutrition goals set during dietitian appointment.;Long Term Goal: Adherence to prescribed nutrition plan.           Nutrition Assessments:  Nutrition Assessments - 07/04/19 1313      MEDFICTS Scores   Pre Score 66           MEDIFICTS Score Key:          ?70 Need to make dietary changes          40-70 Heart Healthy Diet         ? 40 Therapeutic Level Cholesterol Diet  Nutrition Goals Re-Evaluation:  Nutrition Goals Re-Evaluation    [place] Name 07/23/19 1547             Goals   Comment Has not yet had a chance to meet with program dietician.       Expected Outcome Short: schedule appointment program dietician. Long: help control cardiac risk factors with heart healthy diet.              Nutrition Goals Discharge (Final Nutrition Goals Re-Evaluation):  Nutrition Goals Re-Evaluation - 07/23/19 1547      Goals   Comment Has not yet had a chance to meet with program dietician.    Expected Outcome Short: schedule appointment program dietician. Long: help control cardiac risk factors with heart healthy diet.           Psychosocial: Target Goals: Acknowledge presence or absence of significant depression and/or stress, maximize coping skills, provide positive support system.  Participant is able to verbalize types and ability to use techniques and skills needed  for reducing stress and depression.   Education: Depression - Provides group verbal and written instruction on the correlation between heart/lung disease and depressed mood, treatment options, and the stigmas associated with seeking treatment.   Cardiac Rehab from 09/05/2019 in [facility] Cardiac and Pulmonary Rehab  Date 09/05/19  Educator AS  Instruction Review Code 1- Verbalizes Understanding      Education: Sleep Hygiene -Provides group verbal and written instruction about how sleep can affect your health.  Define sleep hygiene, discuss sleep cycles and impact of sleep habits. Review good sleep hygiene tips.     Education: Stress and Anxiety: - Provides group verbal and written instruction about the health risks of elevated stress and causes of high stress.  Discuss the correlation between heart/lung disease and anxiety and treatment options. Review healthy ways to manage with stress and anxiety.   Cardiac Rehab from 09/05/2019 in [facility] Cardiac and Pulmonary Rehab  Date 09/05/19  Educator AS  Instruction Review Code 1- Verbalizes Understanding       Initial Review & Psychosocial Screening:  Initial Psych Review & Screening - 06/28/19 1113      Initial Review   Current issues with Current Anxiety/Panic;Current Stress Concerns    Source of Stress Concerns Chronic Illness      Family Dynamics   Good Support System? Yes    Comments Short: Start [facility] to help with mood. Long: Maintain a healthy mental state.      Barriers   Psychosocial barriers to participate in program There are no identifiable barriers or psychosocial needs.;The patient should benefit from training in stress management and relaxation.      Screening Interventions   Interventions Provide feedback about the scores to participant;To provide support and resources with identified psychosocial needs;Encouraged to exercise    Expected Outcomes Short Term goal: Utilizing psychosocial counselor, staff and physician to  assist with identification of specific Stressors or current issues interfering with healing process. Setting desired goal for each stressor or current issue identified.;Long Term Goal: Stressors or current issues are controlled or eliminated.;Short Term goal: Identification and review with participant of any Quality of Life or Depression concerns found by scoring the questionnaire.;Long Term goal: The participant improves quality of Life and PHQ9 Scores as seen by post scores and/or verbalization of changes           Quality of Life Scores:   Quality of Life - 07/04/19 1310      Quality of Life   Select Quality of Life      Quality of Life Scores   Health/Function Pre 21.2 %    Socioeconomic Pre 24.88 %    Psych/Spiritual Pre 25.57 %    Family Pre 26.1 %    GLOBAL Pre 23.61 %          Scores of 19 and below usually indicate a poorer quality of life in these areas.  A difference of  2-3 points is a clinically meaningful difference.  A difference of 2-3 points in the total score of the Quality of Life Index has been associated with significant improvement in overall quality of life, self-image, physical symptoms, and general health in studies assessing change in quality of life.  PHQ-9: Recent Review Flowsheet Data    Depression screen [facility] 2/9 08/23/2019 07/25/2019 07/04/2019   Decreased Interest 0 1 2   Down, Depressed, Hopeless 1 0 0   PHQ - 2  Score _0 Altered sleeping 2 1 0   Tired, decreased energy _1 Change in appetite _2 Feeling bad or failure about yourself  0 0 0   Trouble concentrating 0 0 1   Moving slowly or fidgety/restless _3 Suicidal thoughts 0 0 0   PHQ-9 Score _4 Difficult doing work/chores Somewhat difficult Not difficult at all  Somewhat difficult     Interpretation of Total Score  Total Score Depression Severity:  1-4 = Minimal depression, 5-9 = Mild depression, 10-14 = Moderate depression, 15-19 = Moderately severe depression, 20-27 =  Severe depression   Psychosocial Evaluation and Intervention:  Psychosocial Evaluation - 06/28/19 1119      Psychosocial Evaluation & Interventions   Interventions Encouraged to exercise with the program and follow exercise prescription    Comments Short: Start [facility] to help with mood. Long: Maintain a healthy mental state.    Expected Outcomes Short: Start [facility] to help with mood. Long: Maintain a healthy mental state.    Continue Psychosocial Services  Follow up required by staff           Psychosocial Re-Evaluation:  Psychosocial Re-Evaluation    [place] Name 07/23/19 1543 09/03/19 1557           Psychosocial Re-Evaluation   Current issues with Current Stress Concerns;Current Sleep Concerns Current Stress Concerns;Current Sleep Concerns      Comments Lesly Rubenstein reports that he does have some trouble sleeping at night because of blatter control issues. He currently has an appointment scheduled to talk with his doctor about possible treatment for this issue in hopes that he will be able to have better uninterupted sleep. Pt reports that he has trouble getting to sleep because of the TV. Discussed proper sleep hygiene. 3-4x will wake up due to bladder issues - on medication- reports it is not working.      Expected Outcomes Short: attend appointment to disucuss blatter control/ sleeping interruptions with doctor. Long: maintian positive attitude and good mental health habits. Short: practice sleep hygiene  Long: maintian positive attitude and good mental health habits.      Interventions Encouraged to attend Cardiac Rehabilitation for the exercise Encouraged to attend Cardiac Rehabilitation for the exercise      Continue Psychosocial Services  Follow up required by staff --        Initial Review   Source of Stress Concerns -- Chronic Illness             Psychosocial Discharge (Final Psychosocial Re-Evaluation):  Psychosocial Re-Evaluation - 09/03/19 1557      Psychosocial  Re-Evaluation   Current issues with Current Stress Concerns;Current Sleep Concerns    Comments Pt reports that he has trouble getting to sleep because of the TV. Discussed proper sleep hygiene. 3-4x will wake up due to bladder issues - on medication- reports it is not working.    Expected Outcomes Short: practice sleep hygiene  Long: maintian positive attitude and good mental health habits.    Interventions Encouraged to attend Cardiac Rehabilitation for the exercise      Initial Review   Source of Stress Concerns Chronic Illness           Vocational Rehabilitation: Provide vocational rehab assistance to qualifying candidates.   Vocational Rehab Evaluation & Intervention:   Education: Education Goals: Education classes will be provided on a variety of topics geared toward better understanding  of heart health and risk factor modification. Participant will state understanding/return demonstration of topics presented as noted by education test scores.  Learning Barriers/Preferences:  Learning Barriers/Preferences - 06/28/19 1115      Learning Barriers/Preferences   Learning Barriers None    Learning Preferences None           General Cardiac Education Topics:  AED/CPR: - Group verbal and written instruction with the use of models to demonstrate the basic use of the AED with the basic ABC's of resuscitation.   Anatomy & Physiology of the Heart: - Group verbal and written instruction and models provide basic cardiac anatomy and physiology, with the coronary electrical and arterial systems. Review of Valvular disease and Heart Failure   Cardiac Procedures: - Group verbal and written instruction to review commonly prescribed medications for heart disease. Reviews the medication, class of the drug, and side effects. Includes the steps to properly store meds and maintain the prescription regimen. (beta blockers and nitrates)   Cardiac Medications I: - Group verbal and written  instruction to review commonly prescribed medications for heart disease. Reviews the medication, class of the drug, and side effects. Includes the steps to properly store meds and maintain the prescription regimen.   Cardiac Medications II: -Group verbal and written instruction to review commonly prescribed medications for heart disease. Reviews the medication, class of the drug, and side effects. (all other drug classes)    Go Sex-Intimacy & Heart Disease, Get SMART - Goal Setting: - Group verbal and written instruction through game format to discuss heart disease and the return to sexual intimacy. Provides group verbal and written material to discuss and apply goal setting through the application of the S.M.A.R.T. Method.   Other Matters of the Heart: - Provides group verbal, written materials and models to describe Stable Angina and Peripheral Artery. Includes description of the disease process and treatment options available to the cardiac patient.   Infection Prevention: - Provides verbal and written material to individual with discussion of infection control including proper hand washing and proper equipment cleaning during exercise session.   Cardiac Rehab from 09/05/2019 in [facility] Cardiac and Pulmonary Rehab  Date 06/28/19  Educator [facility]  Instruction Review Code 1- Verbalizes Understanding      Falls Prevention: - Provides verbal and written material to individual with discussion of falls prevention and safety.   Cardiac Rehab from 09/05/2019 in [facility] Cardiac and Pulmonary Rehab  Date 06/28/19  Educator [facility]  Instruction Review Code 1- Verbalizes Understanding      Other: -Provides group and verbal instruction on various topics (see comments)   Knowledge Questionnaire Score:  Knowledge Questionnaire Score - 07/04/19 1314      Knowledge Questionnaire Score   Pre Score 23/26- Exercise, PAD, Nutrition           Core Components/Risk Factors/Patient Goals at Admission:   Personal Goals and Risk Factors at Admission - 07/04/19 1315      Core Components/Risk Factors/Patient Goals on Admission    Weight Management Yes;Weight Loss    Intervention Weight Management: Develop a combined nutrition and exercise program designed to reach desired caloric intake, while maintaining appropriate intake of nutrient and fiber, sodium and fats, and appropriate energy expenditure required for the weight goal.;Weight Management: Provide education and appropriate resources to help participant work on and attain dietary goals.    Admit Weight 207 lb 3.2 oz (94 kg)    Goal Weight: Short Term 202 lb (91.6 kg)    Goal  Weight: Long Term 197 lb (89.4 kg)    Expected Outcomes Short Term: Continue to assess and modify interventions until short term weight is achieved;Long Term: Adherence to nutrition and physical activity/exercise program aimed toward attainment of established weight goal;Understanding recommendations for meals to include 15-35% energy as protein, 25-35% energy from fat, 35-60% energy from carbohydrates, less than 226m of dietary cholesterol, 20-35 gm of total fiber daily;Understanding of distribution of calorie intake throughout the day with the consumption of 4-5 meals/snacks;Weight Loss: Understanding of general recommendations for a balanced deficit meal plan, which promotes 1-2 lb weight loss per week and includes a negative energy balance of [phone] kcal/d    Diabetes Yes    Intervention Provide education about signs/symptoms and action to take for hypo/hyperglycemia.;Provide education about proper nutrition, including hydration, and aerobic/resistive exercise prescription along with prescribed medications to achieve blood glucose in normal ranges: Fasting glucose 65-99 mg/dL    Expected Outcomes Short Term: Participant verbalizes understanding of the signs/symptoms and immediate care of hyper/hypoglycemia, proper foot care and importance of medication, aerobic/resistive  exercise and nutrition plan for blood glucose control.;Long Term: Attainment of HbA1C < 7%.    Hypertension Yes    Intervention Provide education on lifestyle modifcations including regular physical activity/exercise, weight management, moderate sodium restriction and increased consumption of fresh fruit, vegetables, and low fat dairy, alcohol moderation, and smoking cessation.;Monitor prescription use compliance.    Expected Outcomes Long Term: Maintenance of blood pressure at goal levels.;Short Term: Continued assessment and intervention until BP is < 140/978mHG in hypertensive participants. < 130/8020mG in hypertensive participants with diabetes, heart failure or chronic kidney disease.    Lipids Yes    Intervention Provide education and support for participant on nutrition & aerobic/resistive exercise along with prescribed medications to achieve LDL <42m40mDL >40mg74m Expected Outcomes Short Term: Participant states understanding of desired cholesterol values and is compliant with medications prescribed. Participant is following exercise prescription and nutrition guidelines.;Long Term: Cholesterol controlled with medications as prescribed, with individualized exercise RX and with personalized nutrition plan. Value goals: LDL < 42mg,31m > 40 mg.           Education:Diabetes - Individual verbal and written instruction to review signs/symptoms of diabetes, desired ranges of glucose level fasting, after meals and with exercise. Acknowledge that pre and post exercise glucose checks will be done for 3 sessions at entry of program.   [facility]  Date 06/28/19  [facility]- Verbalizes Understanding      Education: Know Your Numbers and Risk Factors: -Group verbal and written instruction about important numbers in your health.  Discussion of what are risk factors and how they play a role in the disease process.   Review of Cholesterol, Blood Pressure, Diabetes, and BMI and the role they play in your overall health.   Core Components/Risk Factors/Patient Goals Review:   Goals and Risk Factor Review    Row Name 07/23/19 1549 09/03/19 1607           Core Components/Risk Factors/Patient Goals Review   Personal Goals Review Weight Management/Obesity;Lipids;Diabetes;Hypertension Weight Management/Obesity;Lipids;Diabetes;Hypertension      Review Bo reports that he monitors his BP and Blood glucose at home at least 2 times a day or if he is symptomatic. He is currently taking all meds a prescribed. His weight has been maintained since starting the program. Bo repLesly Rubensteints that he monitors his BP and  Blood glucose at home at least 2 times a day or if he is symptomatic. He is currently taking all meds a prescribed. A1C 7.2 which he reports is good for him. 60 units 2x/day of insulin.  His weight has been maintained since starting the program.      Expected Outcomes Short: continue to monitor blood pressure and blood sugar at home. Long: Continue with a heart healthy lifestyle to control risk factors. Short: continue to monitor blood pressure and blood sugar at home. Continue to manage diabetes through lifestyle changes. Long: Continue with a heart healthy lifestyle to control risk factors.             Core Components/Risk Factors/Patient Goals at Discharge (Final Review):   Goals and Risk Factor Review - 09/03/19 1607      Core Components/Risk Factors/Patient Goals Review   Personal Goals Review Weight Management/Obesity;Lipids;Diabetes;Hypertension    Review Bo reports that he monitors his BP and Blood glucose at home at least 2 times a day or if he is symptomatic. He is currently taking all meds a prescribed. A1C 7.2 which he reports is good for him. 60 units 2x/day of insulin.  His weight has been maintained since starting the program.    Expected Outcomes Short: continue to monitor blood pressure and blood  sugar at home. Continue to manage diabetes through lifestyle changes. Long: Continue with a heart healthy lifestyle to control risk factors.           ITP Comments:  ITP Comments    Row Name 06/28/19 1125 07/04/19 1204 07/18/19 0616 08/15/19 0640 09/12/19 0616   ITP Comments Virtual Visit completed. Patient informed on EP and RD appointment and 6 Minute walk test. Patient also informed of patient health questionnaires on My Chart. Patient Verbalizes understanding. Visit diagnosis can be found in [facility] 06/06/2019. Completed 6MWT and gym orientation.  Initial ITP created and sent for review to Dr. Emily Filbert, Medical Director. 30 Day review completed. Medical Director ITP review done, changes made as directed, and signed approval by Medical Director. 30 Day review completed. Medical Director ITP review done, changes made as directed, and signed approval by Medical Director. 30 Day review completed. Medical Director ITP review done, changes made as directed, and signed approval by Medical Director.          Comments:

## 2019-09-12 NOTE — Progress Notes (Signed)
Daily Session Note  Patient Details  Name: KYSER WANDEL MRN: [id number] Date of Birth: 03-13-1945 Referring Provider:     Cardiac Rehab from 07/04/2019 in [facility] Cardiac and Pulmonary Rehab  Referring Provider [name], MD   [facility] (local cardiologist- Dr. Arida)]      Encounter Date: 09/12/2019  Check In:  Session Check In - 09/12/19 1549      Check-In   Supervising physician immediately available to respond to emergencies See telemetry face sheet for immediately available ER MD    Location [facility]-Cardiac & Pulmonary Rehab    Staff Present Renita Papa, RN BSN;Joseph Lou Miner, [place] Exercise Physiologist    Virtual Visit No    Medication changes reported     No    Fall or balance concerns reported    No    Warm-up and Cool-down Performed on first and last piece of equipment    Resistance Training Performed Yes    VAD Patient? No    PAD/SET Patient? No      Pain Assessment   Currently in Pain? No/denies              Social History   Tobacco Use  Smoking Status Never Smoker  Smokeless Tobacco Never Used    Goals Met:  Independence with exercise equipment Exercise tolerated well No report of cardiac concerns or symptoms Strength training completed today  Goals Unmet:  Not Applicable  Comments: Pt able to follow exercise prescription today without complaint.  Will continue to monitor for progression.    Dr. Emily Filbert is Medical Director for [place] and [facility].

## 2019-09-13 ENCOUNTER — Encounter: Payer: No Typology Code available for payment source | Admitting: *Deleted

## 2019-09-13 ENCOUNTER — Other Ambulatory Visit: Payer: Self-pay

## 2019-09-13 DIAGNOSIS — Z951 Presence of aortocoronary bypass graft: Secondary | ICD-10-CM

## 2019-09-13 NOTE — Progress Notes (Signed)
Daily Session Note  Patient Details  Name: Cristian Martin MRN: [id number] Date of Birth: 04-Nov-1945 Referring Provider:     Cardiac Rehab from 07/04/2019 in [facility] Cardiac and Pulmonary Rehab  Referring Provider [name], MD   [facility] (local cardiologist- Dr. Arida)]      Encounter Date: 09/13/2019  Check In:  Session Check In - 09/13/19 1538      Check-In   Supervising physician immediately available to respond to emergencies See telemetry face sheet for immediately available ER MD    Location [facility]-Cardiac & Pulmonary Rehab    Staff Present Renita Papa, RN BSN;Joseph [address] [place], [place], RCEP, CCRP, CCET    Virtual Visit No    Medication changes reported     No    Fall or balance concerns reported    No    Warm-up and Cool-down Performed on first and last piece of equipment    Resistance Training Performed Yes    VAD Patient? No    PAD/SET Patient? No      Pain Assessment   Currently in Pain? No/denies              Social History   Tobacco Use  Smoking Status Never Smoker  Smokeless Tobacco Never Used    Goals Met:  Independence with exercise equipment Exercise tolerated well No report of cardiac concerns or symptoms Strength training completed today  Goals Unmet:  Not Applicable  Comments: Pt able to follow exercise prescription today without complaint.  Will continue to monitor for progression.    Dr. Emily Filbert is Medical Director for [place] and [facility].

## 2019-09-17 ENCOUNTER — Other Ambulatory Visit: Payer: Self-pay

## 2019-09-17 ENCOUNTER — Encounter: Payer: No Typology Code available for payment source | Admitting: *Deleted

## 2019-09-17 DIAGNOSIS — Z951 Presence of aortocoronary bypass graft: Secondary | ICD-10-CM

## 2019-09-17 NOTE — Progress Notes (Signed)
Daily Session Note  Patient Details  Name: POSEY PETRIK MRN: [id number] Date of Birth: 1945/09/01 Referring Provider:     Cardiac Rehab from 07/04/2019 in [facility] Cardiac and Pulmonary Rehab  Referring Provider [name], MD   [facility] (local cardiologist- Dr. Arida)]      Encounter Date: 09/17/2019  Check In:  Session Check In - 09/17/19 1541      Check-In   Supervising physician immediately available to respond to emergencies See telemetry face sheet for immediately available ER MD    Location [facility]-Cardiac & Pulmonary Rehab    Staff Present Heath Lark, RN, BSN, Laveda Norman, BS, ACSM CEP, Exercise Physiologist;Kara Eliezer Bottom, MS Exercise Physiologist    Virtual Visit No    Medication changes reported     No    Fall or balance concerns reported    No    Warm-up and Cool-down Performed on first and last piece of equipment    Resistance Training Performed Yes    VAD Patient? No    PAD/SET Patient? No      Pain Assessment   Currently in Pain? No/denies              Social History   Tobacco Use  Smoking Status Never Smoker  Smokeless Tobacco Never Used    Goals Met:  Independence with exercise equipment Exercise tolerated well No report of cardiac concerns or symptoms  Goals Unmet:  Not Applicable  Comments: Pt able to follow exercise prescription today without complaint.  Will continue to monitor for progression.    Dr. Emily Filbert is Medical Director for [place] and [facility].

## 2019-09-19 ENCOUNTER — Encounter: Payer: No Typology Code available for payment source | Admitting: *Deleted

## 2019-09-19 ENCOUNTER — Other Ambulatory Visit: Payer: Self-pay

## 2019-09-19 DIAGNOSIS — Z951 Presence of aortocoronary bypass graft: Secondary | ICD-10-CM

## 2019-09-19 NOTE — Progress Notes (Signed)
Daily Session Note  Patient Details  Name: MARRIO SCRIBNER MRN: [id number] Date of Birth: 1945/05/28 Referring Provider:     Cardiac Rehab from 07/04/2019 in [facility] Cardiac and Pulmonary Rehab  Referring Provider [name], MD   [facility] (local cardiologist- Dr. Arida)]      Encounter Date: 09/19/2019  Check In:  Session Check In - 09/19/19 1735      Check-In   Supervising physician immediately available to respond to emergencies See telemetry face sheet for immediately available ER MD    Location [facility]-Cardiac & Pulmonary Rehab    Staff Present Nyoka Cowden, RN, BSN, MA;Joseph Hood [place], [place] Exercise Physiologist    Virtual Visit No    Medication changes reported     No    Fall or balance concerns reported    No    Warm-up and Cool-down Performed on first and last piece of equipment    Resistance Training Performed Yes    VAD Patient? No    PAD/SET Patient? No      Pain Assessment   Currently in Pain? No/denies              Social History   Tobacco Use  Smoking Status Never Smoker  Smokeless Tobacco Never Used    Goals Met:  Independence with exercise equipment Exercise tolerated well No report of cardiac concerns or symptoms Strength training completed today  Goals Unmet:  Not Applicable  Comments: Pt able to follow exercise prescription today without complaint.  Will continue to monitor for progression.   Dr. Emily Filbert is Medical Director for [place] and [facility].

## 2019-09-20 ENCOUNTER — Encounter: Payer: No Typology Code available for payment source | Admitting: *Deleted

## 2019-09-20 ENCOUNTER — Other Ambulatory Visit: Payer: Self-pay

## 2019-09-20 DIAGNOSIS — Z951 Presence of aortocoronary bypass graft: Secondary | ICD-10-CM | POA: Diagnosis not present

## 2019-09-20 NOTE — Progress Notes (Signed)
Daily Session Note  Patient Details  Name: Cristian Martin MRN: [id number] Date of Birth: [birth year] Referring Provider:     Cardiac Rehab from 07/04/2019 in [facility] Cardiac and Pulmonary Rehab  Referring Provider [name], MD   [facility] (local cardiologist- Dr. Arida)]      Encounter Date: 09/20/2019  Check In:  Session Check In - 09/20/19 1614      Check-In   Supervising physician immediately available to respond to emergencies See telemetry face sheet for immediately available ER MD    Location [facility]-Cardiac & Pulmonary Rehab    Staff Present Nyoka Cowden, RN, BSN, MA;Joseph Hood RCP,RRT,BSRT;Kelly Amedeo Plenty, BS, ACSM CEP, Exercise Physiologist;Amanda Oletta Darter, [place], ACSM CEP, Exercise Physiologist    Virtual Visit No    Medication changes reported     No    Fall or balance concerns reported    No    Warm-up and Cool-down Performed on first and last piece of equipment    Resistance Training Performed Yes    VAD Patient? No    PAD/SET Patient? No              Social History   Tobacco Use  Smoking Status Never Smoker  Smokeless Tobacco Never Used    Goals Met:  Independence with exercise equipment Exercise tolerated well No report of cardiac concerns or symptoms Strength training completed today  Goals Unmet:  Not Applicable  Comments: Pt able to follow exercise prescription today without complaint.  Will continue to monitor for progression.   Dr. Emily Filbert is Medical Director for [place] and [facility].

## 2019-09-26 ENCOUNTER — Other Ambulatory Visit: Payer: Self-pay

## 2019-09-26 ENCOUNTER — Encounter: Payer: No Typology Code available for payment source | Admitting: *Deleted

## 2019-09-26 DIAGNOSIS — Z951 Presence of aortocoronary bypass graft: Secondary | ICD-10-CM | POA: Diagnosis not present

## 2019-09-26 NOTE — Progress Notes (Signed)
Daily Session Note  Patient Details  Name: Cristian Martin MRN: [id number] Date of Birth: 12/26/1945 Referring Provider:     Cardiac Rehab from 07/04/2019 in [facility] Cardiac and Pulmonary Rehab  Referring Provider [name], MD   [facility] (local cardiologist- Dr. Arida)]      Encounter Date: 09/26/2019  Check In:  Session Check In - 09/26/19 1559      Check-In   Supervising physician immediately available to respond to emergencies See telemetry face sheet for immediately available ER MD    Location [facility]-Cardiac & Pulmonary Rehab    Staff Present Renita Papa, RN Margurite Auerbach, MS Exercise Physiologist;Melissa Caiola RDN, Rowe Pavy, BA, ACSM CEP, Exercise Physiologist    Virtual Visit No    Medication changes reported     No    Fall or balance concerns reported    No    Warm-up and Cool-down Performed on first and last piece of equipment    Resistance Training Performed Yes    VAD Patient? No    PAD/SET Patient? No      Pain Assessment   Currently in Pain? No/denies              Social History   Tobacco Use  Smoking Status Never Smoker  Smokeless Tobacco Never Used    Goals Met:  Independence with exercise equipment Exercise tolerated well No report of cardiac concerns or symptoms Strength training completed today  Goals Unmet:  Not Applicable  Comments: Pt able to follow exercise prescription today without complaint.  Will continue to monitor for progression.    Dr. Emily Filbert is Medical Director for [place] and [facility].

## 2019-09-27 ENCOUNTER — Other Ambulatory Visit: Payer: Self-pay

## 2019-09-27 ENCOUNTER — Encounter: Payer: No Typology Code available for payment source | Admitting: *Deleted

## 2019-09-27 DIAGNOSIS — Z951 Presence of aortocoronary bypass graft: Secondary | ICD-10-CM | POA: Diagnosis not present

## 2019-09-27 NOTE — Progress Notes (Signed)
Daily Session Note  Patient Details  Name: Cristian Martin MRN: [id number] Date of Birth: 1946/01/27 Referring Provider:     Cardiac Rehab from 07/04/2019 in [facility] Cardiac and Pulmonary Rehab  Referring Provider [name], MD   [facility] (local cardiologist- Dr. Arida)]      Encounter Date: 09/27/2019  Check In:  Session Check In - 09/27/19 1537      Check-In   Supervising physician immediately available to respond to emergencies See telemetry face sheet for immediately available ER MD    Location [facility]-Cardiac & Pulmonary Rehab    Staff Present Renita Papa, RN Margurite Auerbach, MS Exercise Physiologist;Jessica Rena Lara, MA, RCEP, CCRP, CCET;Amanda Sommer, [place], ACSM CEP, Exercise Physiologist    Virtual Visit No    Medication changes reported     No    Fall or balance concerns reported    No    Warm-up and Cool-down Performed on first and last piece of equipment    Resistance Training Performed Yes    VAD Patient? No    PAD/SET Patient? No      Pain Assessment   Currently in Pain? No/denies              Social History   Tobacco Use  Smoking Status Never Smoker  Smokeless Tobacco Never Used    Goals Met:  Independence with exercise equipment Exercise tolerated well No report of cardiac concerns or symptoms Strength training completed today  Goals Unmet:  Not Applicable  Comments: Pt able to follow exercise prescription today without complaint.  Will continue to monitor for progression.  Reviewed home exercise with pt today.  Pt plans to walk and use weights at home for exercise.  Reviewed THR, pulse, RPE, sign and symptoms, pulse oximetery and when to call 911 or MD.  Also discussed weather considerations and indoor options.  Pt voiced understanding.  Dr. Emily Filbert is Medical Director for [place] and [facility].

## 2019-10-01 ENCOUNTER — Other Ambulatory Visit: Payer: Self-pay

## 2019-10-01 ENCOUNTER — Encounter: Payer: No Typology Code available for payment source | Admitting: *Deleted

## 2019-10-01 DIAGNOSIS — Z951 Presence of aortocoronary bypass graft: Secondary | ICD-10-CM | POA: Diagnosis not present

## 2019-10-01 NOTE — Progress Notes (Signed)
Daily Session Note  Patient Details  Name: Cristian Martin MRN: [id number] Date of Birth: 12/20/1945 Referring Provider:     Cardiac Rehab from 07/04/2019 in [facility] Cardiac and Pulmonary Rehab  Referring Provider [name], MD   [facility] (local cardiologist- Dr. Arida)]      Encounter Date: 10/01/2019  Check In:  Session Check In - 10/01/19 1527      Check-In   Supervising physician immediately available to respond to emergencies See telemetry face sheet for immediately available ER MD    Location [facility]-Cardiac & Pulmonary Rehab    Staff Present Renita Papa, RN Margurite Auerbach, MS Exercise Physiologist;Kelly Amedeo Plenty, BS, ACSM CEP, Exercise Physiologist    Virtual Visit No    Medication changes reported     No    Fall or balance concerns reported    No    Warm-up and Cool-down Performed on first and last piece of equipment    Resistance Training Performed Yes    VAD Patient? No    PAD/SET Patient? No      Pain Assessment   Currently in Pain? No/denies              Social History   Tobacco Use  Smoking Status Never Smoker  Smokeless Tobacco Never Used    Goals Met:  Independence with exercise equipment Exercise tolerated well No report of cardiac concerns or symptoms Strength training completed today  Goals Unmet:  Not Applicable  Comments: Pt able to follow exercise prescription today without complaint.  Will continue to monitor for progression.    Dr. Emily Filbert is Medical Director for [place] and [facility].

## 2019-10-03 ENCOUNTER — Other Ambulatory Visit: Payer: Self-pay

## 2019-10-03 ENCOUNTER — Encounter: Payer: No Typology Code available for payment source | Attending: Internal Medicine | Admitting: *Deleted

## 2019-10-03 DIAGNOSIS — Z8546 Personal history of malignant neoplasm of prostate: Secondary | ICD-10-CM | POA: Diagnosis not present

## 2019-10-03 DIAGNOSIS — Z794 Long term (current) use of insulin: Secondary | ICD-10-CM | POA: Diagnosis not present

## 2019-10-03 DIAGNOSIS — K219 Gastro-esophageal reflux disease without esophagitis: Secondary | ICD-10-CM | POA: Diagnosis not present

## 2019-10-03 DIAGNOSIS — Z951 Presence of aortocoronary bypass graft: Secondary | ICD-10-CM | POA: Insufficient documentation

## 2019-10-03 DIAGNOSIS — E785 Hyperlipidemia, unspecified: Secondary | ICD-10-CM | POA: Insufficient documentation

## 2019-10-03 DIAGNOSIS — I2510 Atherosclerotic heart disease of native coronary artery without angina pectoris: Secondary | ICD-10-CM | POA: Insufficient documentation

## 2019-10-03 DIAGNOSIS — Z79899 Other long term (current) drug therapy: Secondary | ICD-10-CM | POA: Insufficient documentation

## 2019-10-03 DIAGNOSIS — I10 Essential (primary) hypertension: Secondary | ICD-10-CM | POA: Diagnosis not present

## 2019-10-03 DIAGNOSIS — E1142 Type 2 diabetes mellitus with diabetic polyneuropathy: Secondary | ICD-10-CM | POA: Insufficient documentation

## 2019-10-03 DIAGNOSIS — Z7982 Long term (current) use of aspirin: Secondary | ICD-10-CM | POA: Insufficient documentation

## 2019-10-03 LAB — GLUCOSE, CAPILLARY
Glucose-Capillary: 109 mg/dL — ABNORMAL HIGH (ref 70–99)
Glucose-Capillary: 112 mg/dL — ABNORMAL HIGH (ref 70–99)

## 2019-10-03 NOTE — Progress Notes (Signed)
Daily Session Note  Patient Details  Name: Cristian Martin MRN: [id number] Date of Birth: 10-28-1945 Referring Provider:     Cardiac Rehab from 07/04/2019 in [facility] Cardiac and Pulmonary Rehab  Referring Provider [name], MD   [facility] (local cardiologist- Dr. Arida)]      Encounter Date: 10/03/2019  Check In:  Session Check In - 10/03/19 1545      Check-In   Supervising physician immediately available to respond to emergencies See telemetry face sheet for immediately available ER MD    Location [facility]-Cardiac & Pulmonary Rehab    Staff Present Renita Papa, RN BSN;Joseph Lou Miner, [place] Exercise Physiologist;Amanda Oletta Darter, [place], ACSM CEP, Exercise Physiologist    Virtual Visit No    Medication changes reported     No    Fall or balance concerns reported    No    Warm-up and Cool-down Performed on first and last piece of equipment    Resistance Training Performed Yes    VAD Patient? No    PAD/SET Patient? No      Pain Assessment   Currently in Pain? No/denies              Social History   Tobacco Use  Smoking Status Never Smoker  Smokeless Tobacco Never Used    Goals Met:  Independence with exercise equipment Exercise tolerated well No report of cardiac concerns or symptoms Strength training completed today  Goals Unmet:  Not Applicable  Comments: Pt able to follow exercise prescription today without complaint.  Will continue to monitor for progression.    Dr. Emily Filbert is Medical Director for [facility] and [facility].

## 2019-10-10 ENCOUNTER — Other Ambulatory Visit: Payer: Self-pay

## 2019-10-10 ENCOUNTER — Encounter: Payer: No Typology Code available for payment source | Admitting: *Deleted

## 2019-10-10 DIAGNOSIS — Z951 Presence of aortocoronary bypass graft: Secondary | ICD-10-CM | POA: Diagnosis not present

## 2019-10-10 NOTE — Progress Notes (Signed)
Discharge Progress Report  Patient Details  Name: Cristian Martin MRN: [id number] Date of Birth: November 23, 1945 Referring Provider:     Cardiac Rehab from 07/04/2019 in [facility] Cardiac and Pulmonary Rehab  Referring Provider [name], MD   [facility] (local cardiologist- Dr. Arida)]       Number of Visits: 39  Reason for Discharge:  Patient reached a stable level of exercise. Patient independent in their exercise. Patient has met program and personal goals.  Smoking History:  Social History   Tobacco Use  Smoking Status Never Smoker  Smokeless Tobacco Never Used    Diagnosis:  S/P CABG x 3  ADL UCSD:   Initial Exercise Prescription:  Initial Exercise Prescription - 07/04/19 1200      Date of Initial Exercise RX and Referring Provider   Date 07/04/19    Referring Provider [name], MD    [place] (local cardiologist- Dr. Fletcher Anon)     Treadmill   MPH 1.8    Grade 0.5    Minutes 15    METs 2.5      REL-XR   Level 1    Speed 50    Minutes 15    METs 2.5      T5 Nustep   Level 1    SPM 80    Minutes 15    METs 2.5      Prescription Details   Frequency (times per week) 3    Duration Progress to 30 minutes of continuous aerobic without signs/symptoms of physical distress      Intensity   THRR 40-80% of Max Heartrate 119-137    Ratings of Perceived Exertion 11-13    Perceived Dyspnea 0-4      Progression   Progression Continue to progress workloads to maintain intensity without signs/symptoms of physical distress.      Resistance Training   Training Prescription Yes    Weight 4 lb    Reps 10-15           Discharge Exercise Prescription (Final Exercise Prescription Changes):  Exercise Prescription Changes - 10/02/19 0800      Response to Exercise   Blood Pressure (Admit) 126/74    Blood Pressure (Exercise) 158/82    Blood Pressure (Exit) 114/70    Heart Rate (Admit) 85 bpm    Heart Rate (Exercise) 121 bpm    Heart Rate (Exit) 115 bpm    Rating of  Perceived Exertion (Exercise) 12    Symptoms none    Duration Continue with 30 min of aerobic exercise without signs/symptoms of physical distress.    Intensity THRR unchanged      Progression   Progression Continue to progress workloads to maintain intensity without signs/symptoms of physical distress.    Average METs 3.55      Resistance Training   Training Prescription Yes    Weight 5 lb    Reps 10-15      Treadmill   MPH 2.2    Grade 2    Minutes 15    METs 3.29      REL-XR   Level 2    Speed 50    Minutes 15    METs 3.8      Home Exercise Plan   Plans to continue exercise at Home (comment)   walking, staff videos   Frequency Add 2 additional days to program exercise sessions.    Initial Home Exercises Provided 09/27/19           Functional Capacity:  [place] Name 07/04/19 1205 10/03/19 1615       6 Minute Walk   Phase Initial Discharge    Distance 1024 feet 1090 feet    Distance % Change -- 6.44 %    Distance Feet Change -- 66 ft    Walk Time 6 minutes 6 minutes    # of Rest Breaks 0 0    MPH 1.94 2.06    METS 2.54 2.42    RPE 13 9    Perceived Dyspnea  -- 0    VO2 Peak 8.91 8.49    Symptoms No No    Resting HR 101 bpm 90 bpm    Resting BP 132/64 122/74    Resting Oxygen Saturation  97 % 96 %    Exercise Oxygen Saturation  during 6 min walk 96 % 95 %    Max Ex. HR 121 bpm 114 bpm    Max Ex. BP 158/70 142/70    2 Minute Post BP 126/72 --           Psychological, QOL, Others - Outcomes: PHQ 2/9: Depression screen [facility] 2/9 08/23/2019 07/25/2019 07/04/2019  Decreased Interest 0 1 2  Down, Depressed, Hopeless 1 0 0  PHQ - 2 Score _0 Altered sleeping 2 1 0  Tired, decreased energy _1 Change in appetite _2 Feeling bad or failure about yourself  0 0 0  Trouble concentrating 0 0 1  Moving slowly or fidgety/restless _3 Suicidal thoughts 0 0 0  PHQ-9 Score _4 Difficult doing work/chores Somewhat difficult Not  difficult at all Somewhat difficult    Quality of Life:  Quality of Life - 07/04/19 1310      Quality of Life   Select Quality of Life      Quality of Life Scores   Health/Function Pre 21.2 %    Socioeconomic Pre 24.88 %    Psych/Spiritual Pre 25.57 %    Family Pre 26.1 %    GLOBAL Pre 23.61 %          Nutrition & Weight - Outcomes:  Pre Biometrics - 10/03/19 1619      Pre Biometrics   Single Leg Stand 3.2 seconds            Nutrition:  Nutrition Therapy & Goals - 09/03/19 1549      Nutrition Therapy   Diet Heart healthy, T2DM    Protein (specify units) 75-80g    Fiber 30 grams    Whole Grain Foods 3 servings    Saturated Fats 12 max. grams    Fruits and Vegetables 5 servings/day    Sodium 1.5 grams      Personal Nutrition Goals   Nutrition Goal ST: include vegetables 4x/week LT: breathing improved and would like to increase strength    Comments B: boiled eggs or cereal L: sandwich D: cooking baked chicken or hamburger, goes out 4x/week (spaghetti, BBQ). Pt will cook with canola oil and PAM spray. Pt reports not eating many vegetables. Pt reports his wife cooks vegetables, but he wasn't exposed to it much as a kid. Pt would like to include more vegetables. Pt likes sardines. Dentition problems due to T2DM - eating soft food until he gets his dentures. Discussed heart healthy eating and T2DM eating- pt reports getting education multiple times from the [place]. Gave pt resources on how to make changes flavorful.  Intervention Plan   Intervention Prescribe, educate and counsel regarding individualized specific dietary modifications aiming towards targeted core components such as weight, hypertension, lipid management, diabetes, heart failure and other comorbidities.;Nutrition handout(s) given to patient.    Expected Outcomes Short Term Goal: Understand basic principles of dietary content, such as calories, fat, sodium, cholesterol and nutrients.;Short Term Goal: A plan has  been developed with personal nutrition goals set during dietitian appointment.;Long Term Goal: Adherence to prescribed nutrition plan.           Nutrition Discharge:  Nutrition Assessments - 07/04/19 1313      MEDFICTS Scores   Pre Score 66           Education Questionnaire Score:  Knowledge Questionnaire Score - 07/04/19 1314      Knowledge Questionnaire Score   Pre Score 23/26- Exercise, PAD, Nutrition           Goals reviewed with patient; copy given to patient.

## 2019-10-10 NOTE — Progress Notes (Signed)
Cardiac Individual Treatment Plan  Patient Details  Name: Cristian Martin MRN: [id number] Date of Birth: [birth year] Referring Provider:     Cardiac Rehab from 07/04/2019 in [facility] Cardiac and Pulmonary Rehab  Referring Provider [name], MD   [facility] (local cardiologist- Dr. Arida)]      Initial Encounter Date:    Cardiac Rehab from 07/04/2019 in [facility] Cardiac and Pulmonary Rehab  Date 07/04/19      Visit Diagnosis: S/P CABG x 3  Patient's Home Medications on Admission:  Current Outpatient Medications:  .  acetaminophen (TYLENOL) 325 MG tablet, Take 2 tablets (650 mg total) by mouth every 6 (six) hours as needed for mild pain., Disp:  , Rfl:  .  amLODipine (NORVASC) 10 MG tablet, Take 10 mg by mouth daily., Disp: , Rfl:  .  aspirin EC 81 MG tablet, Take 1 tablet (81 mg total) by mouth daily. Swallow whole., Disp: 30 tablet, Rfl: 11 .  calcium carbonate (TUMS - DOSED IN MG ELEMENTAL CALCIUM) 500 MG chewable tablet, Chew 2-3 tablets by mouth daily as needed for indigestion or heartburn., Disp: , Rfl:  .  carvedilol (COREG) 6.25 MG tablet, Take 3.125 mg by mouth 2 (two) times daily with a meal., Disp: , Rfl:  .  cloNIDine (CATAPRES) 0.3 MG tablet, Take 1 tablet (0.3 mg total) by mouth 2 (two) times daily., Disp: , Rfl:  .  doxazosin (CARDURA) 4 MG tablet, Take 2 mg by mouth at bedtime. , Disp: , Rfl:  .  DULoxetine (CYMBALTA) 60 MG capsule, Take 60 mg by mouth daily., Disp: , Rfl:  .  famotidine (PEPCID) 20 MG tablet, Take 20 mg by mouth 2 (two) times daily., Disp: , Rfl:  .  guaiFENesin (MUCINEX) 600 MG 12 hr tablet, Take 1 tablet (600 mg total) by mouth 2 (two) times daily as needed for cough or to loosen phlegm., Disp: , Rfl:  .  insulin aspart protamine - aspart (NOVOLOG MIX 70/30 FLEXPEN) (70-30) 100 UNIT/ML FlexPen, Inject 0.2 mLs (20 Units total) into the skin 2 (two) times daily before a meal. As the patient and I discussed, start with a lower dose of Insulin as he has not been eating  well. Titrate Insulin upward to dose taken prior to surgery (60 units bid) as tolerates., Disp: 15 mL, Rfl: 11 .  Iron-FA-B Cmp-C-Biot-Probiotic (FUSION PLUS) CAPS, Take 1 tablet by mouth daily., Disp: 30 capsule, Rfl: 2 .  losartan (COZAAR) 50 MG tablet, Take 1 tablet (50 mg total) by mouth daily., Disp: 30 tablet, Rfl: 1 .  metFORMIN (GLUCOPHAGE-XR) 500 MG 24 hr tablet, Take 1,000 mg by mouth in the morning and at bedtime., Disp: , Rfl:  .  Multiple Vitamins-Minerals (MULTIVITAMIN ADULT PO), Take 1 tablet by mouth daily. , Disp: , Rfl:  .  omeprazole (PRILOSEC) 20 MG capsule, Take 20 mg by mouth 2 (two) times daily. , Disp: , Rfl:  .  rosuvastatin (CRESTOR) 40 MG tablet, Take 1 tablet (40 mg total) by mouth daily., Disp: 30 tablet, Rfl: 1 .  Semaglutide (OZEMPIC, 1 MG/DOSE, [place]), Inject 1 mg into the skin every Sunday. , Disp: , Rfl:  .  Trospium Chloride 60 MG CP24, Take 60 mg by mouth daily., Disp: , Rfl:   Past Medical History: Past Medical History:  Diagnosis Date  . Agent orange exposure   . Coronary artery disease    a. 04/2019 Cath: LM 99, LAD 80p, D2 70, LCX 50ost/p, RCA 70p, EF 55-65%; b. 04/2019  CABG x 3 (LIMA to LAD, SVG to OM1, SVG to RCA).  . Diabetic peripheral neuropathy ([place])   . Diastolic dysfunction    a 04/2019 Echo: EF 55-60%, mod conc LVH, Gr1 DD. Nl RV fxn. Mild Ao sclerosis w/o stenosis.  Marland Kitchen GERD (gastroesophageal reflux disease)   . Hyperlipidemia   . Hyperplasia of prostate with lower urinary tract symptoms (LUTS)   . Hypertension   . Prostate cancer [facility]) urologist-- dr ottelin/  oncologist-- dr Tammi Klippel   dx 02-21-2018  -- Stage T1c, Gleason 3+4  . Type 2 diabetes mellitus treated with insulin ([place])    followed by [facility] in [place]  . Wears dentures    upper   . Wears glasses    "wears to help double vision"    Tobacco Use: Social History   Tobacco Use  Smoking Status Never Smoker  Smokeless Tobacco Never Used    Labs: Recent Review Flowsheet Data     Labs for ITP Cardiac and Pulmonary Rehab Latest Ref Rng & Units 04/30/2019 04/30/2019 04/30/2019 04/30/2019 04/30/2019   Cholestrol 0 - 200 mg/dL - - - - -   LDLCALC 0 - 99 mg/dL - - - - -   HDL >40 mg/dL - - - - -   Trlycerides <150 mg/dL - - - - -   Hemoglobin A1c 4.8 - 5.6 % - - - - -   PHART 7.35 - 7.45 - 7.327(L) 7.324(L) 7.348(L) 7.365   PCO2ART 32 - 48 mmHg - 40.6 42.5 37.9 38.3   HCO3 20.0 - 28.0 mmol/L - 21.5 22.3 20.9 21.9   TCO2 22 - 32 mmol/L _0 ACIDBASEDEF 0.0 - 2.0 mmol/L - 5.0(H) 4.0(H) 4.0(H) 3.0(H)   O2SAT % - 91.0 90.0 93.0 94.0       Exercise Target Goals: Exercise Program Goal: Individual exercise prescription set using results from initial 6 min walk test and THRR while considering  patient's activity barriers and safety.   Exercise Prescription Goal: Initial exercise prescription builds to 30-45 minutes a day of aerobic activity, 2-3 days per week.  Home exercise guidelines will be given to patient during program as part of exercise prescription that the participant will acknowledge.   Education: Aerobic Exercise & Resistance Training: - Gives group verbal and written instruction on the various components of exercise. Focuses on aerobic and resistive training programs and the benefits of this training and how to safely progress through these programs..   Education: Exercise & Equipment Safety: - Individual verbal instruction and demonstration of equipment use and safety with use of the equipment.   Cardiac Rehab from 10/10/2019 in [facility] Cardiac and Pulmonary Rehab  Date 06/28/19  Educator [facility]  Instruction Review Code 1- Verbalizes Understanding      Education: Exercise Physiology & General Exercise Guidelines: - Group verbal and written instruction with models to review the exercise physiology of the cardiovascular system and associated critical values. Provides general exercise guidelines with specific guidelines to those with heart or lung disease.     Education: Flexibility, Balance, Mind/Body Relaxation: Provides group verbal/written instruction on the benefits of flexibility and balance training, including mind/body exercise modes such as yoga, pilates and tai chi.  Demonstration and skill practice provided.   Cardiac Rehab from 10/10/2019 in [facility] Cardiac and Pulmonary Rehab  Date 10/03/19  Educator AS  Instruction Review Code 1- Verbalizes Understanding      Activity Barriers & Risk Stratification:  Activity Barriers & Cardiac Risk Stratification -  07/04/19 1210      Activity Barriers & Cardiac Risk Stratification   Activity Barriers Deconditioning;Muscular Weakness;Shortness of Breath;Other (comment);Balance Concerns;Joint Problems    Comments Neuropathy, left shoulder bone spurs    Cardiac Risk Stratification High           6 Minute Walk:  6 Minute Walk    Row Name 07/04/19 1205 10/03/19 1615       6 Minute Walk   Phase Initial Discharge    Distance 1024 feet 1090 feet    Distance % Change -- 6.44 %    Distance Feet Change -- 66 ft    Walk Time 6 minutes 6 minutes    # of Rest Breaks 0 0    MPH 1.94 2.06    METS 2.54 2.42    RPE 13 9    Perceived Dyspnea  -- 0    VO2 Peak 8.91 8.49    Symptoms No No    Resting HR 101 bpm 90 bpm    Resting BP 132/64 122/74    Resting Oxygen Saturation  97 % 96 %    Exercise Oxygen Saturation  during 6 min walk 96 % 95 %    Max Ex. HR 121 bpm 114 bpm    Max Ex. BP 158/70 142/70    2 Minute Post BP 126/72 --           Oxygen Initial Assessment:   Oxygen Re-Evaluation:   Oxygen Discharge (Final Oxygen Re-Evaluation):   Initial Exercise Prescription:  Initial Exercise Prescription - 07/04/19 1200      Date of Initial Exercise RX and Referring Provider   Date 07/04/19    Referring Provider [name], MD    [place] (local cardiologist- Dr. Fletcher Anon)     Treadmill   MPH 1.8    Grade 0.5    Minutes 15    METs 2.5      REL-XR   Level 1    Speed 50    Minutes  15    METs 2.5      T5 Nustep   Level 1    SPM 80    Minutes 15    METs 2.5      Prescription Details   Frequency (times per week) 3    Duration Progress to 30 minutes of continuous aerobic without signs/symptoms of physical distress      Intensity   THRR 40-80% of Max Heartrate 119-137    Ratings of Perceived Exertion 11-13    Perceived Dyspnea 0-4      Progression   Progression Continue to progress workloads to maintain intensity without signs/symptoms of physical distress.      Resistance Training   Training Prescription Yes    Weight 4 lb    Reps 10-15           Perform Capillary Blood Glucose checks as needed.  Exercise Prescription Changes:  Exercise Prescription Changes    Row Name 07/04/19 1200 07/12/19 1400 07/23/19 1600 08/08/19 1600 08/20/19 1400     Response to Exercise   Blood Pressure (Admit) 132/64 122/62 118/56 118/66 124/70   Blood Pressure (Exercise) 158/70 154/70 130/78 154/64 158/80   Blood Pressure (Exit) 126/72 130/80 114/64 142/68 126/70   Heart Rate (Admit) 101 bpm 122 bpm 99 bpm 91 bpm 91 bpm   Heart Rate (Exercise) 121 bpm 134 bpm 136 bpm 113 bpm 127 bpm   Heart Rate (Exit) 101 bpm 96 bpm 97 bpm 84 bpm 110  bpm   Oxygen Saturation (Admit) 97 % -- -- -- --   Oxygen Saturation (Exercise) 96 % -- -- -- --   Rating of Perceived Exertion (Exercise) _0 Symptoms _1    Comments Walk Test Results second full day of exercise -- -- --   Duration -- Progress to 30 minutes of  aerobic without signs/symptoms of physical distress Continue with 30 min of aerobic exercise without signs/symptoms of physical distress. Continue with 30 min of aerobic exercise without signs/symptoms of physical distress. Continue with 30 min of aerobic exercise without signs/symptoms of physical distress.   Intensity -- THRR unchanged THRR unchanged THRR unchanged THRR unchanged     Progression   Progression -- Continue to progress workloads  to maintain intensity without signs/symptoms of physical distress. Continue to progress workloads to maintain intensity without signs/symptoms of physical distress. Continue to progress workloads to maintain intensity without signs/symptoms of physical distress. Continue to progress workloads to maintain intensity without signs/symptoms of physical distress.   Average METs -- 2.37 3.3 2.4 2.89     Resistance Training   Training Prescription -- Yes Yes Yes Yes   Weight -- 4 lb 4 lb 4 lb 4 lb   Reps -- 10-15 10-15 10-15 10-15     Interval Training   Interval Training -- No No No No     Treadmill   MPH -- 1.8 1.8 1.8 2   Grade -- 0.5 0.5 0.5 0.5   Minutes -- _2 METs -- 2.5 2.5 2.5 2.67     REL-XR   Level -- 1 1 -- 1   Minutes -- 15 15 -- 15   METs -- 2.7 4.1 -- 3.7     T5 Nustep   Level -- _3 SPM -- -- -- 80 --   Minutes -- _4 METs -- 1.9 -- 2 2.3   Row Name 09/05/19 1400 09/18/19 1600 09/27/19 1500 10/02/19 0800       Response to Exercise   Blood Pressure (Admit) 132/80 110/64 -- 126/74    Blood Pressure (Exercise) 146/72 130/64 -- 158/82    Blood Pressure (Exit) 130/70 110/62 -- 114/70    Heart Rate (Admit) 95 bpm 99 bpm -- 85 bpm    Heart Rate (Exercise) 127 bpm 121 bpm -- 121 bpm    Heart Rate (Exit) 113 bpm 104 bpm -- 115 bpm    Rating of Perceived Exertion (Exercise) 12 12 -- 12    Symptoms none none -- none    Duration Continue with 30 min of aerobic exercise without signs/symptoms of physical distress. Continue with 30 min of aerobic exercise without signs/symptoms of physical distress. -- Continue with 30 min of aerobic exercise without signs/symptoms of physical distress.    Intensity THRR unchanged THRR unchanged -- THRR unchanged      Progression   Progression Continue to progress workloads to maintain intensity without signs/symptoms of physical distress. Continue to progress workloads to maintain intensity without signs/symptoms of  physical distress. -- Continue to progress workloads to maintain intensity without signs/symptoms of physical distress.    Average METs 2.4 2.86 -- 3.55      Resistance Training   Training Prescription Yes Yes -- Yes    Weight 4 lb 5 lb -- 5 lb    Reps 10-15 10-15 -- 10-15      Interval Training  Interval Training No No -- --      Treadmill   MPH 2 2.2 -- 2.2    Grade 0.5 2 -- 2    Minutes 15 15 -- 15    METs 2.67 3.29 -- 3.29      REL-XR   Level -- 2 -- 2    Speed -- -- -- 50    Minutes -- 15 -- 15    METs -- 2.8 -- 3.8      T5 Nustep   Level 1 2 -- --    SPM 80 -- -- --    Minutes 15 15 -- --    METs 2.1 2.5 -- --      Home Exercise Plan   Plans to continue exercise at -- Home (comment)  walking, staff videos Home (comment)  walking, staff videos Home (comment)  walking, staff videos    Frequency -- Add 2 additional days to program exercise sessions. Add 2 additional days to program exercise sessions. Add 2 additional days to program exercise sessions.    Initial Home Exercises Provided -- 09/17/19 09/27/19 09/27/19           Exercise Comments:   Exercise Goals and Review:  Exercise Goals    Row Name 07/04/19 1230             Exercise Goals   Increase Physical Activity Yes       Intervention Provide advice, education, support and counseling about physical activity/exercise needs.;Develop an individualized exercise prescription for aerobic and resistive training based on initial evaluation findings, risk stratification, comorbidities and participant's personal goals.       Expected Outcomes Short Term: Attend rehab on a regular basis to increase amount of physical activity.;Long Term: Exercising regularly at least 3-5 days a week.;Long Term: Add in home exercise to make exercise part of routine and to increase amount of physical activity.       Increase Strength and Stamina Yes       Intervention Provide advice, education, support and counseling about physical  activity/exercise needs.;Develop an individualized exercise prescription for aerobic and resistive training based on initial evaluation findings, risk stratification, comorbidities and participant's personal goals.       Expected Outcomes Short Term: Increase workloads from initial exercise prescription for resistance, speed, and METs.;Short Term: Perform resistance training exercises routinely during rehab and add in resistance training at home;Long Term: Improve cardiorespiratory fitness, muscular endurance and strength as measured by increased METs and functional capacity (6MWT)       Able to understand and use rate of perceived exertion (RPE) scale Yes       Intervention Provide education and explanation on how to use RPE scale       Expected Outcomes Short Term: Able to use RPE daily in rehab to express subjective intensity level;Long Term:  Able to use RPE to guide intensity level when exercising independently       Able to understand and use Dyspnea scale Yes       Intervention Provide education and explanation on how to use Dyspnea scale       Expected Outcomes Short Term: Able to use Dyspnea scale daily in rehab to express subjective sense of shortness of breath during exertion;Long Term: Able to use Dyspnea scale to guide intensity level when exercising independently       Knowledge and understanding of Target Heart Rate Range (THRR) Yes       Intervention Provide education and explanation of  THRR including how the numbers were predicted and where they are located for reference       Expected Outcomes Short Term: Able to state/look up THRR;Long Term: Able to use THRR to govern intensity when exercising independently;Short Term: Able to use daily as guideline for intensity in rehab       Able to check pulse independently Yes       Intervention Provide education and demonstration on how to check pulse in carotid and radial arteries.;Review the importance of being able to check your own pulse for  safety during independent exercise       Expected Outcomes Short Term: Able to explain why pulse checking is important during independent exercise;Long Term: Able to check pulse independently and accurately       Understanding of Exercise Prescription Yes       Intervention Provide education, explanation, and written materials on patient's individual exercise prescription       Expected Outcomes Short Term: Able to explain program exercise prescription;Long Term: Able to explain home exercise prescription to exercise independently              Exercise Goals Re-Evaluation :  Exercise Goals Re-Evaluation    Row Name 07/12/19 1441 07/23/19 1540 08/08/19 1617 08/20/19 1431 09/03/19 1609     Exercise Goal Re-Evaluation   Exercise Goals Review Increase Physical Activity;Increase Strength and Stamina;Understanding of Exercise Prescription Increase Physical Activity;Increase Strength and Stamina;Understanding of Exercise Prescription Increase Physical Activity;Increase Strength and Stamina;Understanding of Exercise Prescription Increase Physical Activity;Increase Strength and Stamina;Understanding of Exercise Prescription Increase Physical Activity;Increase Strength and Stamina;Understanding of Exercise Prescription   Comments Cristian Martin is off to a good start in rehab.  He has completed his first two full days of exercise.  We will continue to montior his progress. Cristian Martin stated that his main limiting factor is SOB, but has noticed that is has improved some with exercising. He is still getting into a rhythm with rehab attendence as he went on vacation shortly after he started the program. Cristian Martin has been attending consistently He reaches [facility] most sessions.  Staff will monitor progress. Cristian Martin continues to do well in rehab.  He is now up to 2.0 mph on the treadmill.  We will continue to monitor his progress. Cristian Martin continues to do well in rehab.  He is now up to 2.0 mph on the treadmill.  We will continue to monitor his progress.  He reports not working out at home, but has a weighted bar he could use - encouraged to workout outside of rehab to get bst benefit.   Expected Outcomes Short: Continue to attend rehab regularly Long: Continue to follow program prescription Short: attend rehab on e reagular basis. Long: Become independent with an exercise routine. Short: continue to attend consistently Long:  build overall stamina Short: Increase XR workload Long: Continue to improve stamina. Short: workout outside of rehab Long: Continue to improve stamina.   Cristian Martin Name 09/17/19 1546 09/27/19 1554 10/02/19 0822         Exercise Goal Re-Evaluation   Exercise Goals Review Increase Physical Activity;Increase Strength and Stamina;Understanding of Exercise Prescription Increase Physical Activity;Increase Strength and Stamina;Understanding of Exercise Prescription Increase Physical Activity;Increase Strength and Stamina;Understanding of Exercise Prescription     Comments Cristian Martin is doing well in rehab. His SOB is getting better and stamina is starting to improve.  He will be graduating next month.   He has started to use his 5 lb dumbells at home and will start to  use some of daughters equipment.  He is planning to continue to walk outside of class.  We also talked about our staff videos. Reviewed home exercise with pt today.  Pt plans to walk and use weights at home for exercise.  Reviewed THR, pulse, RPE, sign and symptoms, pulse oximetery and when to call 911 or MD.  Also discussed weather considerations and indoor options.  Pt voiced understanding. Cristian Martin attends consistently and works in [employer] range.  He has improved MET level.  Staff will monitor progress.     Expected Outcomes Short: Improve post 6MWT Long: Continue to exercise independently. Short: Continue to walk on off days Long: Continue to exercise independently. Short: increase levels on machines Long: continue consistent exercise            Discharge Exercise Prescription (Final Exercise  Prescription Changes):  Exercise Prescription Changes - 10/02/19 0800      Response to Exercise   Blood Pressure (Admit) 126/74    Blood Pressure (Exercise) 158/82    Blood Pressure (Exit) 114/70    Heart Rate (Admit) 85 bpm    Heart Rate (Exercise) 121 bpm    Heart Rate (Exit) 115 bpm    Rating of Perceived Exertion (Exercise) 12    Symptoms none    Duration Continue with 30 min of aerobic exercise without signs/symptoms of physical distress.    Intensity THRR unchanged      Progression   Progression Continue to progress workloads to maintain intensity without signs/symptoms of physical distress.    Average METs 3.55      Resistance Training   Training Prescription Yes    Weight 5 lb    Reps 10-15      Treadmill   MPH 2.2    Grade 2    Minutes 15    METs 3.29      REL-XR   Level 2    Speed 50    Minutes 15    METs 3.8      Home Exercise Plan   Plans to continue exercise at Home (comment)   walking, staff videos   Frequency Add 2 additional days to program exercise sessions.    Initial Home Exercises Provided 09/27/19           Nutrition:  Target Goals: Understanding of nutrition guidelines, daily intake of sodium <1562m, cholesterol <207m calories 30% from fat and 7% or less from saturated fats, daily to have 5 or more servings of fruits and vegetables.  Education: Controlling Sodium/Reading Food Labels -Group verbal and written material supporting the discussion of sodium use in heart healthy nutrition. Review and explanation with models, verbal and written materials for utilization of the food label.   Education: General Nutrition Guidelines/Fats and Fiber: -Group instruction provided by verbal, written material, models and posters to present the general guidelines for heart healthy nutrition. Gives an explanation and review of dietary fats and fiber.   [facility]  Date 10/10/19  [facility] Review Code 1- Verbalizes Understanding      Biometrics:  Pre Biometrics - 10/03/19 1619      Pre Biometrics   Single Leg Stand 3.2 seconds            Nutrition Therapy Plan and Nutrition Goals:  Nutrition Therapy & Goals - 09/03/19 1549      Nutrition Therapy   Diet Heart healthy, T2DM    Protein (specify units) 75-80g  Fiber 30 grams    Whole Grain Foods 3 servings    Saturated Fats 12 max. grams    Fruits and Vegetables 5 servings/day    Sodium 1.5 grams      Personal Nutrition Goals   Nutrition Goal ST: include vegetables 4x/week LT: breathing improved and would like to increase strength    Comments B: boiled eggs or cereal L: sandwich D: cooking baked chicken or hamburger, goes out 4x/week (spaghetti, BBQ). Pt will cook with canola oil and PAM spray. Pt reports not eating many vegetables. Pt reports his wife cooks vegetables, but he wasn't exposed to it much as a kid. Pt would like to include more vegetables. Pt likes sardines. Dentition problems due to T2DM - eating soft food until he gets his dentures. Discussed heart healthy eating and T2DM eating- pt reports getting education multiple times from the [place]. Gave pt resources on how to make changes flavorful.      Intervention Plan   Intervention Prescribe, educate and counsel regarding individualized specific dietary modifications aiming towards targeted core components such as weight, hypertension, lipid management, diabetes, heart failure and other comorbidities.;Nutrition handout(s) given to patient.    Expected Outcomes Short Term Goal: Understand basic principles of dietary content, such as calories, fat, sodium, cholesterol and nutrients.;Short Term Goal: A plan has been developed with personal nutrition goals set during dietitian appointment.;Long Term Goal: Adherence to prescribed nutrition plan.           Nutrition Assessments:  Nutrition Assessments - 07/04/19 1313      MEDFICTS Scores   Pre  Score 66           MEDIFICTS Score Key:          ?70 Need to make dietary changes          40-70 Heart Healthy Diet         ? 40 Therapeutic Level Cholesterol Diet  Nutrition Goals Re-Evaluation:  Nutrition Goals Re-Evaluation    Cristian Martin Name 07/23/19 1547 09/17/19 1552           Goals   Nutrition Goal -- More vegetables      Comment Has not yet had a chance to meet with program dietician. Cristian Martin is working on his diet.  His wife is starting program and will drill down.  He is doing his best to avoid salt.  He will continue with changes.      Expected Outcome Short: schedule appointment program dietician. Long: help control cardiac risk factors with heart healthy diet. Continue with changes set up with dietitian.             Nutrition Goals Discharge (Final Nutrition Goals Re-Evaluation):  Nutrition Goals Re-Evaluation - 09/17/19 1552      Goals   Nutrition Goal More vegetables    Comment Cristian Martin is working on his diet.  His wife is starting program and will drill down.  He is doing his best to avoid salt.  He will continue with changes.    Expected Outcome Continue with changes set up with dietitian.           Psychosocial: Target Goals: Acknowledge presence or absence of significant depression and/or stress, maximize coping skills, provide positive support system. Participant is able to verbalize types and ability to use techniques and skills needed for reducing stress and depression.   Education: Depression - Provides group verbal and written instruction on the correlation between heart/lung disease and depressed mood, treatment options, and the stigmas  associated with seeking treatment.   Cardiac Rehab from 10/10/2019 in [facility] Cardiac and Pulmonary Rehab  Date 09/05/19  Educator AS  Instruction Review Code 1- Verbalizes Understanding      Education: Sleep Hygiene -Provides group verbal and written instruction about how sleep can affect your health.  Define sleep hygiene,  discuss sleep cycles and impact of sleep habits. Review good sleep hygiene tips.     Education: Stress and Anxiety: - Provides group verbal and written instruction about the health risks of elevated stress and causes of high stress.  Discuss the correlation between heart/lung disease and anxiety and treatment options. Review healthy ways to manage with stress and anxiety.   Cardiac Rehab from 10/10/2019 in [facility] Cardiac and Pulmonary Rehab  Date 09/05/19  Educator AS  Instruction Review Code 1- Verbalizes Understanding       Initial Review & Psychosocial Screening:  Initial Psych Review & Screening - 06/28/19 1113      Initial Review   Current issues with Current Anxiety/Panic;Current Stress Concerns    Source of Stress Concerns Chronic Illness      Family Dynamics   Good Support System? Yes    Comments Short: Start [facility] to help with mood. Long: Maintain a healthy mental state.      Barriers   Psychosocial barriers to participate in program There are no identifiable barriers or psychosocial needs.;The patient should benefit from training in stress management and relaxation.      Screening Interventions   Interventions Provide feedback about the scores to participant;To provide support and resources with identified psychosocial needs;Encouraged to exercise    Expected Outcomes Short Term goal: Utilizing psychosocial counselor, staff and physician to assist with identification of specific Stressors or current issues interfering with healing process. Setting desired goal for each stressor or current issue identified.;Long Term Goal: Stressors or current issues are controlled or eliminated.;Short Term goal: Identification and review with participant of any Quality of Life or Depression concerns found by scoring the questionnaire.;Long Term goal: The participant improves quality of Life and PHQ9 Scores as seen by post scores and/or verbalization of changes           Quality of Life  Scores:   Quality of Life - 07/04/19 1310      Quality of Life   Select Quality of Life      Quality of Life Scores   Health/Function Pre 21.2 %    Socioeconomic Pre 24.88 %    Psych/Spiritual Pre 25.57 %    Family Pre 26.1 %    GLOBAL Pre 23.61 %          Scores of 19 and below usually indicate a poorer quality of life in these areas.  A difference of  2-3 points is a clinically meaningful difference.  A difference of 2-3 points in the total score of the Quality of Life Index has been associated with significant improvement in overall quality of life, self-image, physical symptoms, and general health in studies assessing change in quality of life.  PHQ-9: Recent Review Flowsheet Data    Depression screen [facility] 2/9 08/23/2019 07/25/2019 07/04/2019   Decreased Interest 0 1 2   Down, Depressed, Hopeless 1 0 0   PHQ - 2 Score _0 Altered sleeping 2 1 0   Tired, decreased energy _1 Change in appetite _2 Feeling bad or failure about yourself  0 0 0   Trouble concentrating 0  0 1   Moving slowly or fidgety/restless _0 Suicidal thoughts 0 0 0   PHQ-9 Score _1 Difficult doing work/chores Somewhat difficult Not difficult at all  Somewhat difficult     Interpretation of Total Score  Total Score Depression Severity:  1-4 = Minimal depression, 5-9 = Mild depression, 10-14 = Moderate depression, 15-19 = Moderately severe depression, 20-27 = Severe depression   Psychosocial Evaluation and Intervention:  Psychosocial Evaluation - 09/17/19 1551      Discharge Psychosocial Assessment & Intervention   Comments Cristian Martin is planning to graduate and beginning of next month.  He is already set with plans to continue and has  enjoyed the program.  He has adjusted some sleep habits as well.           Psychosocial Re-Evaluation:  Psychosocial Re-Evaluation    Cristian Martin Name 07/23/19 1543 09/03/19 1557 09/17/19 1550         Psychosocial Re-Evaluation   Current issues with Current  Stress Concerns;Current Sleep Concerns Current Stress Concerns;Current Sleep Concerns Current Stress Concerns;Current Sleep Concerns     Comments Cristian Martin reports that he does have some trouble sleeping at night because of blatter control issues. He currently has an appointment scheduled to talk with his doctor about possible treatment for this issue in hopes that he will be able to have better uninterupted sleep. Pt reports that he has trouble getting to sleep because of the TV. Discussed proper sleep hygiene. 3-4x will wake up due to bladder issues - on medication- reports it is not working. Cristian Martin is doing well in rehab.  He is nearing graduation.  He is going to [place] on Thursday to see his cardiologist to get clearance to get tooth pulled.  He is sleeping a little better and trying to stay away from TV more at night time.     Expected Outcomes Short: attend appointment to disucuss blatter control/ sleeping interruptions with doctor. Long: maintian positive attitude and good mental health habits. Short: practice sleep hygiene  Long: maintian positive attitude and good mental health habits. Short: practice sleep hygiene  Long: maintian positive attitude and good mental health habits.     Interventions Encouraged to attend Cardiac Rehabilitation for the exercise Encouraged to attend Cardiac Rehabilitation for the exercise Encouraged to attend Cardiac Rehabilitation for the exercise     Continue Psychosocial Services  Follow up required by staff -- --       Initial Review   Source of Stress Concerns -- Chronic Illness --            Psychosocial Discharge (Final Psychosocial Re-Evaluation):  Psychosocial Re-Evaluation - 09/17/19 1550      Psychosocial Re-Evaluation   Current issues with Current Stress Concerns;Current Sleep Concerns    Comments Cristian Martin is doing well in rehab.  He is nearing graduation.  He is going to [place] on Thursday to see his cardiologist to get clearance to get tooth pulled.  He is sleeping a  little better and trying to stay away from TV more at night time.    Expected Outcomes Short: practice sleep hygiene  Long: maintian positive attitude and good mental health habits.    Interventions Encouraged to attend Cardiac Rehabilitation for the exercise           Vocational Rehabilitation: Provide vocational rehab assistance to qualifying candidates.   Vocational Rehab Evaluation & Intervention:   Education: Education Goals: Education classes will be provided on a variety of  topics geared toward better understanding of heart health and risk factor modification. Participant will state understanding/return demonstration of topics presented as noted by education test scores.  Learning Barriers/Preferences:  Learning Barriers/Preferences - 06/28/19 1115      Learning Barriers/Preferences   Learning Barriers None    Learning Preferences None           General Cardiac Education Topics:  AED/CPR: - Group verbal and written instruction with the use of models to demonstrate the basic use of the AED with the basic ABC's of resuscitation.   Anatomy & Physiology of the Heart: - Group verbal and written instruction and models provide basic cardiac anatomy and physiology, with the coronary electrical and arterial systems. Review of Valvular disease and Heart Failure   Cardiac Procedures: - Group verbal and written instruction to review commonly prescribed medications for heart disease. Reviews the medication, class of the drug, and side effects. Includes the steps to properly store meds and maintain the prescription regimen. (beta blockers and nitrates)   Cardiac Medications I: - Group verbal and written instruction to review commonly prescribed medications for heart disease. Reviews the medication, class of the drug, and side effects. Includes the steps to properly store meds and maintain the prescription regimen.   Cardiac Medications II: -Group verbal and written instruction  to review commonly prescribed medications for heart disease. Reviews the medication, class of the drug, and side effects. (all other drug classes)   Cardiac Rehab from 10/10/2019 in [facility] Cardiac and Pulmonary Rehab  Date 09/12/19  Educator AS  Instruction Review Code 1- Verbalizes Understanding       Go Sex-Intimacy & Heart Disease, Get SMART - Goal Setting: - Group verbal and written instruction through game format to discuss heart disease and the return to sexual intimacy. Provides group verbal and written material to discuss and apply goal setting through the application of the S.M.A.R.T. Method.   Other Matters of the Heart: - Provides group verbal, written materials and models to describe Stable Angina and Peripheral Artery. Includes description of the disease process and treatment options available to the cardiac patient.   Infection Prevention: - Provides verbal and written material to individual with discussion of infection control including proper hand washing and proper equipment cleaning during exercise session.   Cardiac Rehab from 10/10/2019 in [facility] Cardiac and Pulmonary Rehab  Date 06/28/19  Educator [facility]  Instruction Review Code 1- Verbalizes Understanding      Falls Prevention: - Provides verbal and written material to individual with discussion of falls prevention and safety.   Cardiac Rehab from 10/10/2019 in [facility] Cardiac and Pulmonary Rehab  Date 06/28/19  Educator [facility]  Instruction Review Code 1- Verbalizes Understanding      Other: -Provides group and verbal instruction on various topics (see comments)   Knowledge Questionnaire Score:  Knowledge Questionnaire Score - 07/04/19 1314      Knowledge Questionnaire Score   Pre Score 23/26- Exercise, PAD, Nutrition           Core Components/Risk Factors/Patient Goals at Admission:  Personal Goals and Risk Factors at Admission - 07/04/19 1315      Core Components/Risk Factors/Patient Goals on Admission     Weight Management Yes;Weight Loss    Intervention Weight Management: Develop a combined nutrition and exercise program designed to reach desired caloric intake, while maintaining appropriate intake of nutrient and fiber, sodium and fats, and appropriate energy expenditure required for the weight goal.;Weight Management: Provide education and appropriate resources to help  participant work on and attain dietary goals.    Admit Weight 207 lb 3.2 oz (94 kg)    Goal Weight: Short Term 202 lb (91.6 kg)    Goal Weight: Long Term 197 lb (89.4 kg)    Expected Outcomes Short Term: Continue to assess and modify interventions until short term weight is achieved;Long Term: Adherence to nutrition and physical activity/exercise program aimed toward attainment of established weight goal;Understanding recommendations for meals to include 15-35% energy as protein, 25-35% energy from fat, 35-60% energy from carbohydrates, less than 220m of dietary cholesterol, 20-35 gm of total fiber daily;Understanding of distribution of calorie intake throughout the day with the consumption of 4-5 meals/snacks;Weight Loss: Understanding of general recommendations for a balanced deficit meal plan, which promotes 1-2 lb weight loss per week and includes a negative energy balance of [phone] kcal/d    Diabetes Yes    Intervention Provide education about signs/symptoms and action to take for hypo/hyperglycemia.;Provide education about proper nutrition, including hydration, and aerobic/resistive exercise prescription along with prescribed medications to achieve blood glucose in normal ranges: Fasting glucose 65-99 mg/dL    Expected Outcomes Short Term: Participant verbalizes understanding of the signs/symptoms and immediate care of hyper/hypoglycemia, proper foot care and importance of medication, aerobic/resistive exercise and nutrition plan for blood glucose control.;Long Term: Attainment of HbA1C < 7%.    Hypertension Yes    Intervention  Provide education on lifestyle modifcations including regular physical activity/exercise, weight management, moderate sodium restriction and increased consumption of fresh fruit, vegetables, and low fat dairy, alcohol moderation, and smoking cessation.;Monitor prescription use compliance.    Expected Outcomes Long Term: Maintenance of blood pressure at goal levels.;Short Term: Continued assessment and intervention until BP is < 140/940mHG in hypertensive participants. < 130/8085mG in hypertensive participants with diabetes, heart failure or chronic kidney disease.    Lipids Yes    Intervention Provide education and support for participant on nutrition & aerobic/resistive exercise along with prescribed medications to achieve LDL <42m59mDL >40mg33m Expected Outcomes Short Term: Participant states understanding of desired cholesterol values and is compliant with medications prescribed. Participant is following exercise prescription and nutrition guidelines.;Long Term: Cholesterol controlled with medications as prescribed, with individualized exercise RX and with personalized nutrition plan. Value goals: LDL < 42mg,75m > 40 mg.           Education:Diabetes - Individual verbal and written instruction to review signs/symptoms of diabetes, desired ranges of glucose level fasting, after meals and with exercise. Acknowledge that pre and post exercise glucose checks will be done for 3 sessions at entry of program.   [facility]  Date 06/28/19  [facility]- Verbal[facility]      Education: Know Your Numbers and Risk Factors: -Group verbal and written instruction about important numbers in your health.  Discussion of what are risk factors and how they play a role in the disease process.  Review of Cholesterol, Blood Pressure, Diabetes, and BMI and the role they play in your overall health.   [facility]  Date 09/12/19  Educator AS  Instruction Review Code 1- Verbalizes Understanding      Core Components/Risk Factors/Patient Goals Review:   Goals and Risk Factor Review    Row Name 07/23/19 1549 09/03/19 1607 09/17/19 1551         Core Components/Risk Factors/Patient Goals Review  Personal Goals Review Weight Management/Obesity;Lipids;Diabetes;Hypertension Weight Management/Obesity;Lipids;Diabetes;Hypertension Weight Management/Obesity;Lipids;Diabetes;Hypertension     Review Cristian Martin reports that he monitors his BP and Blood glucose at home at least 2 times a day or if he is symptomatic. He is currently taking all meds a prescribed. His weight has been maintained since starting the program. Cristian Martin reports that he monitors his BP and Blood glucose at home at least 2 times a day or if he is symptomatic. He is currently taking all meds a prescribed. A1C 7.2 which he reports is good for him. 60 units 2x/day of insulin.  His weight has been maintained since starting the program. Cristian Martin is doing well in rehab.  His weight is down and he continues to try to lose.  He is doing well with his sugar and mostly stable.  Blood pressures have been good and improved with exercise. He plans to continue to keep an eye on it as well.     Expected Outcomes Short: continue to monitor blood pressure and blood sugar at home. Long: Continue with a heart healthy lifestyle to control risk factors. Short: continue to monitor blood pressure and blood sugar at home. Continue to manage diabetes through lifestyle changes. Long: Continue with a heart healthy lifestyle to control risk factors. Short; continue to work on weight loss Long; Continue to monitor risk factors.            Core Components/Risk Factors/Patient Goals at Discharge (Final Review):   Goals and Risk Factor Review - 09/17/19 1551      Core Components/Risk Factors/Patient Goals Review   Personal Goals Review Weight  Management/Obesity;Lipids;Diabetes;Hypertension    Review Cristian Martin is doing well in rehab.  His weight is down and he continues to try to lose.  He is doing well with his sugar and mostly stable.  Blood pressures have been good and improved with exercise. He plans to continue to keep an eye on it as well.    Expected Outcomes Short; continue to work on weight loss Long; Continue to monitor risk factors.           ITP Comments:  ITP Comments    Row Name 06/28/19 1125 07/04/19 1204 07/18/19 0616 08/15/19 0640 09/12/19 0616   ITP Comments Virtual Visit completed. Patient informed on EP and RD appointment and 6 Minute walk test. Patient also informed of patient health questionnaires on My Chart. Patient Verbalizes understanding. Visit diagnosis can be found in [facility] 06/06/[facility]. Completed 6MWT and gym orientation.  Initial ITP created and sent for review to Dr. Emily Filbert, Medical Director. 30 Day review completed. Medical Director ITP review done, changes made as directed, and signed approval by Medical Director. 30 Day review completed. Medical Director ITP review done, changes made as directed, and signed approval by Medical Director. 30 Day review completed. Medical Director ITP review done, changes made as directed, and signed approval by Medical Director.   Cristian Martin Name 10/10/19 1554           ITP Comments Cejay graduated today from  rehab with 35 sessions completed.  Details of the patient's exercise prescription and what He needs to do in order to continue the prescription and progress were discussed with patient.  Patient was given a copy of prescription and goals.  Patient verbalized understanding.  Cristian Martin plans to continue to exercise by walking.              Comments: discharge ITP

## 2019-10-10 NOTE — Patient Instructions (Signed)
Discharge Patient Instructions  Patient Details  Name: Cristian Martin MRN: [id number] Date of Birth: [birth year] Referring Provider:  [name], MD   Number of Visits: 66  Reason for Discharge:  Patient reached a stable level of exercise. Patient independent in their exercise. Patient has met program and personal goals.  Smoking History:  Social History   Tobacco Use  Smoking Status Never Smoker  Smokeless Tobacco Never Used    Diagnosis:  S/P CABG x 3  Initial Exercise Prescription:  Initial Exercise Prescription - 07/04/19 1200      Date of Initial Exercise RX and Referring Provider   Date 07/04/19    Referring Provider [name], MD    [place] (local cardiologist- Dr. Fletcher Anon)     Treadmill   MPH 1.8    Grade 0.5    Minutes 15    METs 2.5      REL-XR   Level 1    Speed 50    Minutes 15    METs 2.5      T5 Nustep   Level 1    SPM 80    Minutes 15    METs 2.5      Prescription Details   Frequency (times per week) 3    Duration Progress to 30 minutes of continuous aerobic without signs/symptoms of physical distress      Intensity   THRR 40-80% of Max Heartrate 119-137    Ratings of Perceived Exertion 11-13    Perceived Dyspnea 0-4      Progression   Progression Continue to progress workloads to maintain intensity without signs/symptoms of physical distress.      Resistance Training   Training Prescription Yes    Weight 4 lb    Reps 10-15           Discharge Exercise Prescription (Final Exercise Prescription Changes):  Exercise Prescription Changes - 10/02/19 0800      Response to Exercise   Blood Pressure (Admit) 126/74    Blood Pressure (Exercise) 158/82    Blood Pressure (Exit) 114/70    Heart Rate (Admit) 85 bpm    Heart Rate (Exercise) 121 bpm    Heart Rate (Exit) 115 bpm    Rating of Perceived Exertion (Exercise) 12    Symptoms none    Duration Continue with 30 min of aerobic exercise without signs/symptoms of physical  distress.    Intensity THRR unchanged      Progression   Progression Continue to progress workloads to maintain intensity without signs/symptoms of physical distress.    Average METs 3.55      Resistance Training   Training Prescription Yes    Weight 5 lb    Reps 10-15      Treadmill   MPH 2.2    Grade 2    Minutes 15    METs 3.29      REL-XR   Level 2    Speed 50    Minutes 15    METs 3.8      Home Exercise Plan   Plans to continue exercise at Home (comment)   walking, staff videos   Frequency Add 2 additional days to program exercise sessions.    Initial Home Exercises Provided 09/27/19           Functional Capacity:  6 Minute Walk    Row Name 07/04/19 1205 10/03/19 1615       6 Minute Walk   Phase Initial Discharge    Distance  1024 feet 1090 feet    Distance % Change -- 6.44 %    Distance Feet Change -- 66 ft    Walk Time 6 minutes 6 minutes    # of Rest Breaks 0 0    MPH 1.94 2.06    METS 2.54 2.42    RPE 13 9    Perceived Dyspnea  -- 0    VO2 Peak 8.91 8.49    Symptoms No No    Resting HR 101 bpm 90 bpm    Resting BP 132/64 122/74    Resting Oxygen Saturation  97 % 96 %    Exercise Oxygen Saturation  during 6 min walk 96 % 95 %    Max Ex. HR 121 bpm 114 bpm    Max Ex. BP 158/70 142/70    2 Minute Post BP 126/72 --           Quality of Life:  Quality of Life - 07/04/19 1310      Quality of Life   Select Quality of Life      Quality of Life Scores   Health/Function Pre 21.2 %    Socioeconomic Pre 24.88 %    Psych/Spiritual Pre 25.57 %    Family Pre 26.1 %    GLOBAL Pre 23.61 %           Personal Goals: Goals established at orientation with interventions provided to work toward goal.  Personal Goals and Risk Factors at Admission - 07/04/19 1315      Core Components/Risk Factors/Patient Goals on Admission    Weight Management Yes;Weight Loss    Intervention Weight Management: Develop a combined nutrition and exercise program  designed to reach desired caloric intake, while maintaining appropriate intake of nutrient and fiber, sodium and fats, and appropriate energy expenditure required for the weight goal.;Weight Management: Provide education and appropriate resources to help participant work on and attain dietary goals.    Admit Weight 207 lb 3.2 oz (94 kg)    Goal Weight: Short Term 202 lb (91.6 kg)    Goal Weight: Long Term 197 lb (89.4 kg)    Expected Outcomes Short Term: Continue to assess and modify interventions until short term weight is achieved;Long Term: Adherence to nutrition and physical activity/exercise program aimed toward attainment of established weight goal;Understanding recommendations for meals to include 15-35% energy as protein, 25-35% energy from fat, 35-60% energy from carbohydrates, less than 240m of dietary cholesterol, 20-35 gm of total fiber daily;Understanding of distribution of calorie intake throughout the day with the consumption of 4-5 meals/snacks;Weight Loss: Understanding of general recommendations for a balanced deficit meal plan, which promotes 1-2 lb weight loss per week and includes a negative energy balance of [phone] kcal/d    Diabetes Yes    Intervention Provide education about signs/symptoms and action to take for hypo/hyperglycemia.;Provide education about proper nutrition, including hydration, and aerobic/resistive exercise prescription along with prescribed medications to achieve blood glucose in normal ranges: Fasting glucose 65-99 mg/dL    Expected Outcomes Short Term: Participant verbalizes understanding of the signs/symptoms and immediate care of hyper/hypoglycemia, proper foot care and importance of medication, aerobic/resistive exercise and nutrition plan for blood glucose control.;Long Term: Attainment of HbA1C < 7%.    Hypertension Yes    Intervention Provide education on lifestyle modifcations including regular physical activity/exercise, weight management, moderate  sodium restriction and increased consumption of fresh fruit, vegetables, and low fat dairy, alcohol moderation, and smoking cessation.;Monitor prescription use compliance.  Expected Outcomes Long Term: Maintenance of blood pressure at goal levels.;Short Term: Continued assessment and intervention until BP is < 140/63m HG in hypertensive participants. < 130/843mHG in hypertensive participants with diabetes, heart failure or chronic kidney disease.    Lipids Yes    Intervention Provide education and support for participant on nutrition & aerobic/resistive exercise along with prescribed medications to achieve LDL <7075mHDL >50m18m  Expected Outcomes Short Term: Participant states understanding of desired cholesterol values and is compliant with medications prescribed. Participant is following exercise prescription and nutrition guidelines.;Long Term: Cholesterol controlled with medications as prescribed, with individualized exercise RX and with personalized nutrition plan. Value goals: LDL < 70mg70mL > 40 mg.            Personal Goals Discharge:  Goals and Risk Factor Review - 09/17/19 1551      Core Components/Risk Factors/Patient Goals Review   Personal Goals Review Weight Management/Obesity;Lipids;Diabetes;Hypertension    Review Bo isLesly Rubensteinoing well in rehab.  His weight is down and he continues to try to lose.  He is doing well with his sugar and mostly stable.  Blood pressures have been good and improved with exercise. He plans to continue to keep an eye on it as well.    Expected Outcomes Short; continue to work on weight loss Long; Continue to monitor risk factors.           Exercise Goals and Review:  Exercise Goals    Row Name 07/04/19 1230             Exercise Goals   Increase Physical Activity Yes       Intervention Provide advice, education, support and counseling about physical activity/exercise needs.;Develop an individualized exercise prescription for aerobic and  resistive training based on initial evaluation findings, risk stratification, comorbidities and participant's personal goals.       Expected Outcomes Short Term: Attend rehab on a regular basis to increase amount of physical activity.;Long Term: Exercising regularly at least 3-5 days a week.;Long Term: Add in home exercise to make exercise part of routine and to increase amount of physical activity.       Increase Strength and Stamina Yes       Intervention Provide advice, education, support and counseling about physical activity/exercise needs.;Develop an individualized exercise prescription for aerobic and resistive training based on initial evaluation findings, risk stratification, comorbidities and participant's personal goals.       Expected Outcomes Short Term: Increase workloads from initial exercise prescription for resistance, speed, and METs.;Short Term: Perform resistance training exercises routinely during rehab and add in resistance training at home;Long Term: Improve cardiorespiratory fitness, muscular endurance and strength as measured by increased METs and functional capacity (6MWT)       Able to understand and use rate of perceived exertion (RPE) scale Yes       Intervention Provide education and explanation on how to use RPE scale       Expected Outcomes Short Term: Able to use RPE daily in rehab to express subjective intensity level;Long Term:  Able to use RPE to guide intensity level when exercising independently       Able to understand and use Dyspnea scale Yes       Intervention Provide education and explanation on how to use Dyspnea scale       Expected Outcomes Short Term: Able to use Dyspnea scale daily in rehab to express subjective sense of shortness of breath during exertion;Long Term: Able  to use Dyspnea scale to guide intensity level when exercising independently       Knowledge and understanding of Target Heart Rate Range (THRR) Yes       Intervention Provide education and  explanation of THRR including how the numbers were predicted and where they are located for reference       Expected Outcomes Short Term: Able to state/look up THRR;Long Term: Able to use THRR to govern intensity when exercising independently;Short Term: Able to use daily as guideline for intensity in rehab       Able to check pulse independently Yes       Intervention Provide education and demonstration on how to check pulse in carotid and radial arteries.;Review the importance of being able to check your own pulse for safety during independent exercise       Expected Outcomes Short Term: Able to explain why pulse checking is important during independent exercise;Long Term: Able to check pulse independently and accurately       Understanding of Exercise Prescription Yes       Intervention Provide education, explanation, and written materials on patient's individual exercise prescription       Expected Outcomes Short Term: Able to explain program exercise prescription;Long Term: Able to explain home exercise prescription to exercise independently              Exercise Goals Re-Evaluation:  Exercise Goals Re-Evaluation    Row Name 07/12/19 1441 07/23/19 1540 08/08/19 1617 08/20/19 1431 09/03/19 1609     Exercise Goal Re-Evaluation   Exercise Goals Review Increase Physical Activity;Increase Strength and Stamina;Understanding of Exercise Prescription Increase Physical Activity;Increase Strength and Stamina;Understanding of Exercise Prescription Increase Physical Activity;Increase Strength and Stamina;Understanding of Exercise Prescription Increase Physical Activity;Increase Strength and Stamina;Understanding of Exercise Prescription Increase Physical Activity;Increase Strength and Stamina;Understanding of Exercise Prescription   Comments Lesly Rubenstein is off to a good start in rehab.  He has completed his first two full days of exercise.  We will continue to montior his progress. Bo stated that his main  limiting factor is SOB, but has noticed that is has improved some with exercising. He is still getting into a rhythm with rehab attendence as he went on vacation shortly after he started the program. Lesly Rubenstein has been attending consistently He reaches [facility] most sessions.  Staff will monitor progress. Lesly Rubenstein continues to do well in rehab.  He is now up to 2.0 mph on the treadmill.  We will continue to monitor his progress. Lesly Rubenstein continues to do well in rehab.  He is now up to 2.0 mph on the treadmill.  We will continue to monitor his progress. He reports not working out at home, but has a weighted bar he could use - encouraged to workout outside of rehab to get bst benefit.   Expected Outcomes Short: Continue to attend rehab regularly Long: Continue to follow program prescription Short: attend rehab on e reagular basis. Long: Become independent with an exercise routine. Short: continue to attend consistently Long:  build overall stamina Short: Increase XR workload Long: Continue to improve stamina. Short: workout outside of rehab Long: Continue to improve stamina.   [place] Name 09/17/19 1546 09/27/19 1554 10/02/19 0822         Exercise Goal Re-Evaluation   Exercise Goals Review Increase Physical Activity;Increase Strength and Stamina;Understanding of Exercise Prescription Increase Physical Activity;Increase Strength and Stamina;Understanding of Exercise Prescription Increase Physical Activity;Increase Strength and Stamina;Understanding of Exercise Prescription     Comments Lesly Rubenstein is doing  well in rehab. His SOB is getting better and stamina is starting to improve.  He will be graduating next month.   He has started to use his 5 lb dumbells at home and will start to use some of daughters equipment.  He is planning to continue to walk outside of class.  We also talked about our staff videos. Reviewed home exercise with pt today.  Pt plans to walk and use weights at home for exercise.  Reviewed THR, pulse, RPE, sign and symptoms,  pulse oximetery and when to call 911 or MD.  Also discussed weather considerations and indoor options.  Pt voiced understanding. Bo attends consistently and works in [employer] range.  He has improved MET level.  Staff will monitor progress.     Expected Outcomes Short: Improve post 6MWT Long: Continue to exercise independently. Short: Continue to walk on off days Long: Continue to exercise independently. Short: increase levels on machines Long: continue consistent exercise            Nutrition & Weight - Outcomes:  Pre Biometrics - 10/03/19 1619      Pre Biometrics   Single Leg Stand 3.2 seconds            Nutrition:  Nutrition Therapy & Goals - 09/03/19 1549      Nutrition Therapy   Diet Heart healthy, T2DM    Protein (specify units) 75-80g    Fiber 30 grams    Whole Grain Foods 3 servings    Saturated Fats 12 max. grams    Fruits and Vegetables 5 servings/day    Sodium 1.5 grams      Personal Nutrition Goals   Nutrition Goal ST: include vegetables 4x/week LT: breathing improved and would like to increase strength    Comments B: boiled eggs or cereal L: sandwich D: cooking baked chicken or hamburger, goes out 4x/week (spaghetti, BBQ). Pt will cook with canola oil and PAM spray. Pt reports not eating many vegetables. Pt reports his wife cooks vegetables, but he wasn't exposed to it much as a kid. Pt would like to include more vegetables. Pt likes sardines. Dentition problems due to T2DM - eating soft food until he gets his dentures. Discussed heart healthy eating and T2DM eating- pt reports getting education multiple times from the [place]. Gave pt resources on how to make changes flavorful.      Intervention Plan   Intervention Prescribe, educate and counsel regarding individualized specific dietary modifications aiming towards targeted core components such as weight, hypertension, lipid management, diabetes, heart failure and other comorbidities.;Nutrition handout(s) given to patient.     Expected Outcomes Short Term Goal: Understand basic principles of dietary content, such as calories, fat, sodium, cholesterol and nutrients.;Short Term Goal: A plan has been developed with personal nutrition goals set during dietitian appointment.;Long Term Goal: Adherence to prescribed nutrition plan.           Nutrition Discharge:  Nutrition Assessments - 07/04/19 1313      MEDFICTS Scores   Pre Score 66           Education Questionnaire Score:  Knowledge Questionnaire Score - 07/04/19 1314      Knowledge Questionnaire Score   Pre Score 23/26- Exercise, PAD, Nutrition           Goals reviewed with patient; copy given to patient.

## 2019-10-10 NOTE — Progress Notes (Signed)
Daily Session Note  Patient Details  Name: Cristian Martin MRN: [id number] Date of Birth: 1945/08/29 Referring Provider:     Cardiac Rehab from 07/04/2019 in [facility]  Referring Provider [name], MD   [facility] (local cardiologist- Dr. Arida)]      Encounter Date: 10/10/2019  Check In:  Session Check In - 10/10/19 1551      Check-In   Supervising physician immediately available to respond to emergencies See telemetry face sheet for immediately available ER MD    Location [facility]-Cardiac & Pulmonary Rehab    Staff Present Renita Papa, RN Margurite Auerbach, MS Exercise Physiologist;Amanda Oletta Darter, BA, ACSM CEP, Exercise Physiologist;Melissa Caiola RDN, LDN    Virtual Visit No    Medication changes reported     No    Fall or balance concerns reported    No    Warm-up and Cool-down Performed on first and last piece of equipment    Resistance Training Performed Yes    VAD Patient? No    PAD/SET Patient? No      Pain Assessment   Currently in Pain? No/denies              Social History   Tobacco Use  Smoking Status Never Smoker  Smokeless Tobacco Never Used    Goals Met:  Independence with exercise equipment Exercise tolerated well No report of cardiac concerns or symptoms Strength training completed today  Goals Unmet:  Not Applicable  Comments:  Cristian Martin graduated today from  rehab with 35 sessions completed.  Details of the patient's exercise prescription and what He needs to do in order to continue the prescription and progress were discussed with patient.  Patient was given a copy of prescription and goals.  Patient verbalized understanding.  Cristian Martin plans to continue to exercise by walking.    Dr. Emily Filbert is Medical Director for [facility].

## 2019-10-23 ENCOUNTER — Other Ambulatory Visit: Payer: Self-pay

## 2019-10-23 ENCOUNTER — Ambulatory Visit (INDEPENDENT_AMBULATORY_CARE_PROVIDER_SITE_OTHER): Payer: No Typology Code available for payment source | Admitting: Gastroenterology

## 2019-10-23 ENCOUNTER — Encounter: Payer: Self-pay | Admitting: Gastroenterology

## 2019-10-23 VITALS — BP 156/90 | HR 101 | Temp 98.0°F | Ht 71.0 in | Wt 213.5 lb

## 2019-10-23 DIAGNOSIS — D509 Iron deficiency anemia, unspecified: Secondary | ICD-10-CM | POA: Diagnosis not present

## 2019-10-23 DIAGNOSIS — K219 Gastro-esophageal reflux disease without esophagitis: Secondary | ICD-10-CM | POA: Diagnosis not present

## 2019-10-23 DIAGNOSIS — K5909 Other constipation: Secondary | ICD-10-CM

## 2019-10-23 NOTE — Progress Notes (Signed)
Cephas Darby, MD [address]  [place]  [place], [place] [address]  Main: [phone]  Fax: [phone]    Gastroenterology Consultation  Referring Provider:     [place] Primary [facility] Physician:  [facility] Primary Gastroenterologist:  Dr. Cephas Darby Reason for Consultation:     History of GERD, frequent burping, abdominal bloating        HPI:   Cristian Martin is a 74 y.o. male referred by Dr. Domingo [facility]  for consultation & management of chronic GERD, frequent burping and abdominal bloating.  Chronic GERD: Patient reports that he has been having long history of reflux symptoms for which he has been taking omeprazole 20 mg 2 times a day before meals.  Currently, he reports that his heartburn is under control.  However reports frequent burping almost on a daily basis.  He also reports abdominal bloating.  He is also on Pepcid 20 mg 2 times a day.  Patient recently underwent CABG in 05/2019, on aspirin 325 mg daily.  Closely followed by his cardiologist, Dr. Fletcher Anon.  He is currently undergoing cardiac rehab.  He has preserved cardiac function  He is found to have normocytic anemia  He also reports irregular bowel habits, bowel movements every 2 days, worsening of abdominal bloating.  He does acknowledge drinking diet sodas which she has cut back significantly.  He has history of poorly controlled diabetes, currently on insulin and Metformin.  His hemoglobin A1c has significantly improved  Follow-up visit 10/23/2019 Patient has an deficiency anemia, underwent upper endoscopy which revealed small hiatal hernia, gastric biopsies negative for H. pylori.  Underwent colonoscopy which revealed small tubular adenomas only.  His ferritin less than 20, currently on oral iron.  Referred to Dr. Janese Banks, hematologist, patient wanted to try oral iron.  He has follow-up with her on Friday this week with follow-up labs.  He reports that his energy levels are gradually  improving.  He denies dark stools, rectal bleeding.  His main concern is irregular bowel habits, incomplete emptying he does have 1-2 small bowel movements which are very hard.  He does drink diet soda daily, less intake of water.  He has not tried any stool softener.  He underwent physical therapy which has been very helpful.  He is currently on aspirin 81 mg currently per his cardiologist  NSAIDs: Aspirin 81 mg for history of coronary artery disease  Antiplts/Anticoagulants/Anti thrombotics: Aspirin 81 mg for history of coronary artery disease  GI Procedures: Colonoscopy in 2015, found to have hyperplastic polyp in the rectum, full report not available.  Patient reports that he is due for another colonoscopy  EGD and colonoscopy 08/16/2019  - Normal duodenal bulb and second portion of the duodenum. - Small hiatal hernia. - Normal stomach. Biopsied. - Esophagogastric landmarks identified. - Normal gastroesophageal junction and esophagus.  - Preparation of the colon was fair. - Four 4 to 5 mm polyps in the transverse colon and in the cecum, removed with a cold snare. Resected and retrieved. - One diminutive polyp in the cecum, removed with a cold biopsy forceps. Resected and retrieved. - The distal rectum and anal verge are normal on retroflexion view. DIAGNOSIS:  A. STOMACH; RANDOM COLD BIOPSY:  - ANTRAL MUCOSA WITH FOCAL EROSION AND REACTIVE FOVEOLAR HYPERPLASIA.  - OXYNTIC MUCOSA WITH PROTON PUMP INHIBITOR EFFECT.  - NEGATIVE FOR H. PYLORI, INTESTINAL METAPLASIA, DYSPLASIA, AND  MALIGNANCY.   B. COLON POLYP X 2, CECUM; COLD SNARE  AND COLD BIOPSY:  - TUBULAR ADENOMAS, 2 FRAGMENTS.  - NEGATIVE FOR HIGH-GRADE DYSPLASIA AND MALIGNANCY.   C. COLON POLYP X 3, TRANSVERSE; COLD SNARE:  - TUBULAR ADENOMAS, 4 FRAGMENTS.  - NEGATIVE FOR HIGH-GRADE DYSPLASIA AND MALIGNANCY.   Past Medical History:  Diagnosis Date  . Agent orange exposure   . Coronary artery disease    a. 04/2019 Cath:  LM 99, LAD 80p, D2 70, LCX 50ost/p, RCA 70p, EF 55-65%; b. 04/2019 CABG x 3 (LIMA to LAD, SVG to OM1, SVG to RCA).  . Diabetic peripheral neuropathy ([place])   . Diastolic dysfunction    a 04/2019 Echo: EF 55-60%, mod conc LVH, Gr1 DD. Nl RV fxn. Mild Ao sclerosis w/o stenosis.  Marland Kitchen GERD (gastroesophageal reflux disease)   . Hyperlipidemia   . Hyperplasia of prostate with lower urinary tract symptoms (LUTS)   . Hypertension   . Prostate cancer [facility]) urologist-- dr ottelin/  oncologist-- dr Tammi Klippel   dx 02-21-2018  -- Stage T1c, Gleason 3+4  . Type 2 diabetes mellitus treated with insulin ([place])    followed by [facility] in [place]  . Wears dentures    upper   . Wears glasses    "wears to help double vision"    Past Surgical History:  Procedure Laterality Date  . COLONOSCOPY WITH PROPOFOL N/A 08/16/2019   Procedure: COLONOSCOPY WITH PROPOFOL;  Surgeon: Lin Landsman, MD;  Location: [facility] ENDOSCOPY;  Service: Gastroenterology;  Laterality: N/A;  . CORONARY ARTERY BYPASS GRAFT N/A 04/30/2019   Procedure: CORONARY ARTERY BYPASS GRAFTING (CABG) TIMES THREE USING LEFT INTERNAL MAMMARY AND RIGHT GREATER SAPHENOUS VEIN HARVESTED ENDOSCOPICALLY. MAMMARY ARTERY TO LAD, SAPHENOUS VEIN GRAFT TO  OM1, SAPHENOUS VEING GRAFT TO RIGHT. BIOPSY OF MEDIASTINAL TISSUE PLACEMENT OF RIGHT FEMORAL A-LINE;  Surgeon: Grace Isaac, MD;  Location: [place];  Service: Open Heart Surgery;  Laterality: N/A;  . CYSTOSCOPY N/A 07/14/2018   Procedure: CYSTOSCOPY;  Surgeon: Kathie Rhodes, MD;  Location: [facility];  Service: Urology;  Laterality: N/A;  no seeds in bladder per Dr Karsten Ro  . ESOPHAGOGASTRODUODENOSCOPY (EGD) WITH PROPOFOL N/A 08/16/2019   Procedure: ESOPHAGOGASTRODUODENOSCOPY (EGD) WITH PROPOFOL;  Surgeon: Lin Landsman, MD;  Location: [place];  Service: Gastroenterology;  Laterality: N/A;  . LEFT HEART CATH AND CORONARY ANGIOGRAPHY N/A 04/27/2019   Procedure: LEFT HEART CATH AND  CORONARY ANGIOGRAPHY;  Surgeon: Wellington Hampshire, MD;  Location: [place] CV LAB;  Service: Cardiovascular;  Laterality: N/A;  . PROSTATE BIOPSY  02-21-2018  dr Karsten Ro in office  . RADIOACTIVE SEED IMPLANT N/A 07/14/2018   Procedure: RADIOACTIVE SEED IMPLANT/BRACHYTHERAPY IMPLANT;  Surgeon: Kathie Rhodes, MD;  Location: [facility];  Service: Urology;  Laterality: N/A;  77 seeds   . SHOULDER ARTHROSCOPY Left 2018   in [place]   "remove spur"  . SPACE OAR INSTILLATION N/A 07/14/2018   Procedure: SPACE OAR INSTILLATION;  Surgeon: Kathie Rhodes, MD;  Location: [facility];  Service: Urology;  Laterality: N/A;  . TEE WITHOUT CARDIOVERSION N/A 04/30/2019   Procedure: TRANSESOPHAGEAL ECHOCARDIOGRAM (TEE);  Surgeon: Grace Isaac, MD;  Location: [place];  Service: Open Heart Surgery;  Laterality: N/A;    Current Outpatient Medications:  .  acetaminophen (TYLENOL) 325 MG tablet, Take 2 tablets (650 mg total) by mouth every 6 (six) hours as needed for mild pain., Disp:  , Rfl:  .  amLODipine (NORVASC) 10 MG tablet, Take 10 mg by mouth daily., Disp: , Rfl:  .  aspirin EC 81 MG tablet, Take 1 tablet (81 mg total) by mouth daily. Swallow whole., Disp: 30 tablet, Rfl: 11 .  calcium carbonate (TUMS - DOSED IN MG ELEMENTAL CALCIUM) 500 MG chewable tablet, Chew 2-3 tablets by mouth daily as needed for indigestion or heartburn., Disp: , Rfl:  .  carvedilol (COREG) 6.25 MG tablet, Take 3.125 mg by mouth 2 (two) times daily with a meal., Disp: , Rfl:  .  cloNIDine (CATAPRES) 0.3 MG tablet, Take 1 tablet (0.3 mg total) by mouth 2 (two) times daily., Disp: , Rfl:  .  doxazosin (CARDURA) 4 MG tablet, Take 2 mg by mouth at bedtime. , Disp: , Rfl:  .  DULoxetine (CYMBALTA) 60 MG capsule, Take 60 mg by mouth daily., Disp: , Rfl:  .  famotidine (PEPCID) 20 MG tablet, Take 20 mg by mouth 2 (two) times daily., Disp: , Rfl:  .  guaiFENesin (MUCINEX) 600 MG 12 hr tablet, Take 1 tablet  (600 mg total) by mouth 2 (two) times daily as needed for cough or to loosen phlegm., Disp: , Rfl:  .  insulin aspart protamine - aspart (NOVOLOG MIX 70/30 FLEXPEN) (70-30) 100 UNIT/ML FlexPen, Inject 0.2 mLs (20 Units total) into the skin 2 (two) times daily before a meal. As the patient and I discussed, start with a lower dose of Insulin as he has not been eating well. Titrate Insulin upward to dose taken prior to surgery (60 units bid) as tolerates., Disp: 15 mL, Rfl: 11 .  Iron-FA-B Cmp-C-Biot-Probiotic (FUSION PLUS) CAPS, Take 1 tablet by mouth daily., Disp: 30 capsule, Rfl: 2 .  losartan (COZAAR) 50 MG tablet, Take 1 tablet (50 mg total) by mouth daily., Disp: 30 tablet, Rfl: 1 .  metFORMIN (GLUCOPHAGE-XR) 500 MG 24 hr tablet, Take 1,000 mg by mouth in the morning and at bedtime., Disp: , Rfl:  .  Multiple Vitamins-Minerals (MULTIVITAMIN ADULT PO), Take 1 tablet by mouth daily. , Disp: , Rfl:  .  omeprazole (PRILOSEC) 20 MG capsule, Take 20 mg by mouth 2 (two) times daily. , Disp: , Rfl:  .  rosuvastatin (CRESTOR) 40 MG tablet, Take 1 tablet (40 mg total) by mouth daily., Disp: 30 tablet, Rfl: 1 .  Semaglutide (OZEMPIC, 1 MG/DOSE, [place]), Inject 1 mg into the skin every Sunday. , Disp: , Rfl:    Family History  Problem Relation Age of Onset  . Stroke Mother   . Diabetes Father   . Hypertension Father   . Diabetes Sister   . Cancer Neg Hx      Social History   Tobacco Use  . Smoking status: Never Smoker  . Smokeless tobacco: Never Used  Vaping Use  . Vaping Use: Never used  Substance Use Topics  . Alcohol use: Not Currently  . Drug use: Never    Allergies as of 10/23/2019 - Review Complete 10/23/2019  Allergen Reaction Noted  . Ace inhibitors Other (See Comments) 08/01/2013    Review of Systems:    All systems reviewed and negative except where noted in HPI.   Physical Exam:  BP (!) 156/90 (BP Location: Left Arm, Patient Position: Sitting, Cuff Size: Normal)   Pulse (!)  101   Temp 98 F (36.7 C) (Oral)   Ht 5\' 11"  (1.803 m)   Wt 213 lb 8 oz (96.8 kg)   BMI 29.78 kg/m  No LMP for male patient.  General:   Alert,  Well-developed, well-nourished, pleasant and cooperative in NAD Head:  Normocephalic  and atraumatic. Eyes:  Sclera clear, no icterus.   Conjunctiva pink. Ears:  Normal auditory acuity. Nose:  No deformity, discharge, or lesions. Mouth:  No deformity or lesions,oropharynx pink & moist. Neck:  Supple; no masses or thyromegaly. Lungs:  Respirations even and unlabored.  Clear throughout to auscultation.   No wheezes, crackles, or rhonchi. No acute distress. Heart:  Regular rate and rhythm; no murmurs, clicks, rubs, or gallops. Abdomen:  Normal bowel sounds. Soft, non-tender and mildly distended without masses, hepatosplenomegaly or hernias noted.  No guarding or rebound tenderness.   Rectal: Not performed Msk:  Symmetrical without gross deformities. Good, equal movement & strength bilaterally. Pulses:  Normal pulses noted. Extremities:  No clubbing or edema.  No cyanosis. Neurologic:  Alert and oriented x3;  grossly normal neurologically. Skin:  Intact without significant lesions or rashes. No jaundice. Psych:  Alert and cooperative. Normal mood and affect.  Imaging Studies: No abdominal imaging  Assessment and Plan:   Cristian Martin is a 74 y.o. male with history of metabolic syndrome, coronary artery disease, triple-vessel bypass in 05/2019 previously on aspirin 325 mg daily, switched to 81 mg daily, chronic GERD, constipation and abdominal bloating  Chronic GERD and burping EGD revealed small hiatal hernia, no evidence of Barrett's esophagus Continue omeprazole 20 mg twice daily before meals Continue antireflux lifestyle Strongly advised to stop carbonated beverages  Chronic constipation and abdominal bloating Discussed about high-fiber diet, stool softener such as MiraLAX daily Stop diet sodas Adequate intake of water  Tubular  adenomas of the colon Recommend surveillance colonoscopy in 2024  Iron deficiency anemia Currently on oral iron, anemia stable Follow-up with Dr. Janese Banks If persistently iron deficient and not responding to iron replacement within next 3 months, recommend video capsule endoscopy   Follow up in 3 months   Cephas Darby, MD

## 2019-10-25 ENCOUNTER — Inpatient Hospital Stay: Payer: PPO

## 2019-10-26 ENCOUNTER — Encounter: Payer: Self-pay | Admitting: Oncology

## 2019-10-26 ENCOUNTER — Inpatient Hospital Stay: Payer: PPO

## 2019-10-26 ENCOUNTER — Other Ambulatory Visit: Payer: Self-pay

## 2019-10-26 ENCOUNTER — Inpatient Hospital Stay: Payer: PPO | Attending: Oncology | Admitting: Oncology

## 2019-10-26 VITALS — BP 139/84 | HR 90 | Temp 97.8°F | Resp 16 | Wt 216.5 lb

## 2019-10-26 DIAGNOSIS — I2510 Atherosclerotic heart disease of native coronary artery without angina pectoris: Secondary | ICD-10-CM | POA: Diagnosis not present

## 2019-10-26 DIAGNOSIS — D509 Iron deficiency anemia, unspecified: Secondary | ICD-10-CM

## 2019-10-26 DIAGNOSIS — I10 Essential (primary) hypertension: Secondary | ICD-10-CM | POA: Insufficient documentation

## 2019-10-26 DIAGNOSIS — E1142 Type 2 diabetes mellitus with diabetic polyneuropathy: Secondary | ICD-10-CM | POA: Diagnosis not present

## 2019-10-26 DIAGNOSIS — Z79899 Other long term (current) drug therapy: Secondary | ICD-10-CM | POA: Diagnosis not present

## 2019-10-26 DIAGNOSIS — Z794 Long term (current) use of insulin: Secondary | ICD-10-CM | POA: Insufficient documentation

## 2019-10-26 LAB — CBC WITH DIFFERENTIAL/PLATELET
Abs Immature Granulocytes: 0.03 10*3/uL (ref 0.00–0.07)
Basophils Absolute: 0 10*3/uL (ref 0.0–0.1)
Basophils Relative: 1 %
Eosinophils Absolute: 1.2 10*3/uL — ABNORMAL HIGH (ref 0.0–0.5)
Eosinophils Relative: 14 %
HCT: 39.8 % (ref 39.0–52.0)
Hemoglobin: 13 g/dL (ref 13.0–17.0)
Immature Granulocytes: 0 %
Lymphocytes Relative: 22 %
Lymphs Abs: 1.8 10*3/uL (ref 0.7–4.0)
MCH: 26.7 pg (ref 26.0–34.0)
MCHC: 32.7 g/dL (ref 30.0–36.0)
MCV: 81.9 fL (ref 80.0–100.0)
Monocytes Absolute: 0.6 10*3/uL (ref 0.1–1.0)
Monocytes Relative: 7 %
Neutro Abs: 4.7 10*3/uL (ref 1.7–7.7)
Neutrophils Relative %: 56 %
Platelets: 238 10*3/uL (ref 150–400)
RBC: 4.86 MIL/uL (ref 4.22–5.81)
RDW: 17.4 % — ABNORMAL HIGH (ref 11.5–15.5)
WBC: 8.4 10*3/uL (ref 4.0–10.5)
nRBC: 0 % (ref 0.0–0.2)

## 2019-10-26 LAB — IRON AND TIBC
Iron: 67 ug/dL (ref 45–182)
Saturation Ratios: 15 % — ABNORMAL LOW (ref 17.9–39.5)
TIBC: 449 ug/dL (ref 250–450)
UIBC: 382 ug/dL

## 2019-10-26 LAB — FERRITIN: Ferritin: 15 ng/mL — ABNORMAL LOW (ref 24–336)

## 2019-10-26 LAB — TSH: TSH: 1.808 u[IU]/mL (ref 0.350–4.500)

## 2019-10-26 LAB — VITAMIN B12: Vitamin B-12: 357 pg/mL (ref 180–914)

## 2019-10-26 LAB — FOLATE: Folate: 21.2 ng/mL (ref >5.9)

## 2019-10-29 NOTE — Progress Notes (Signed)
Hematology/Oncology Consult note [facility]  [phone] [phone]  Patient Care Team: [place] as PCP - General ([place]) Wellington Hampshire, MD as PCP - Cardiology (Cardiology)   Name of the patient: Cristian Martin  [id number]  07/16/1945   Date of visit: 10/29/19  Diagnosis-iron deficiency anemia  Chief complaint/ Reason for visit-routine follow-up of iron deficiency anemia  Heme/Onc history: Patient is a 74 year old male with a past medical history significant for hypertension, diabetes among other medical problems.  He was recently seen by Dr. Marius Ditch for his history of GERD as well as abdominal bloating.  As a part of his work-up he underwent blood work which showed hemoglobin of 10.3 with MCV of 86.  White count and platelets were normal.  He was noted to have a low ferritin of 15.  Patient has recently undergone prostate surgery with radioactive seed implantation for prostate cancer as well as triple bypass.  Reports that his fatigue is slowly getting better after his bypass surgery.  He recently started taking oral iron about 3 weeks ago.  He underwent EGD and colonoscopyOn 08/16/2019.  EGD showed a small hiatal hernia within normal stomach and duodenal bulb normal-appearing esophagus and GE junction.  Colonoscopy showed multiple polyps and a repeat colonoscopy in 3 years was recommended.  Biopsies were all negative for malignancy.  Interval history-patient is tolerating oral iron well without any difficulty.  He has mild fatigue but denies other complaints at this time.  ECOG PS- 1 Pain scale- 0   Review of systems- Review of Systems  Constitutional: Positive for malaise/fatigue. Negative for chills, fever and weight loss.  HENT: Negative for congestion, ear discharge and nosebleeds.   Eyes: Negative for blurred vision.  Respiratory: Negative for cough, hemoptysis, sputum production, shortness of breath and  wheezing.   Cardiovascular: Negative for chest pain, palpitations, orthopnea and claudication.  Gastrointestinal: Negative for abdominal pain, blood in stool, constipation, diarrhea, heartburn, melena, nausea and vomiting.  Genitourinary: Negative for dysuria, flank pain, frequency, hematuria and urgency.  Musculoskeletal: Negative for back pain, joint pain and myalgias.  Skin: Negative for rash.  Neurological: Negative for dizziness, tingling, focal weakness, seizures, weakness and headaches.  Endo/Heme/Allergies: Does not bruise/bleed easily.  Psychiatric/Behavioral: Negative for depression and suicidal ideas. The patient does not have insomnia.       Allergies  Allergen Reactions  . Ace Inhibitors Other (See Comments)    Hyperkalemia.     Past Medical History:  Diagnosis Date  . Agent orange exposure   . Coronary artery disease    a. 04/2019 Cath: LM 99, LAD 80p, D2 70, LCX 50ost/p, RCA 70p, EF 55-65%; b. 04/2019 CABG x 3 (LIMA to LAD, SVG to OM1, SVG to RCA).  . Diabetic peripheral neuropathy ([place])   . Diastolic dysfunction    a 04/2019 Echo: EF 55-60%, mod conc LVH, Gr1 DD. Nl RV fxn. Mild Ao sclerosis w/o stenosis.  Marland Kitchen GERD (gastroesophageal reflux disease)   . Hyperlipidemia   . Hyperplasia of prostate with lower urinary tract symptoms (LUTS)   . Hypertension   . Prostate cancer [facility]) urologist-- dr ottelin/  oncologist-- dr Tammi Klippel   dx 02-21-2018  -- Stage T1c, Gleason 3+4  . Type 2 diabetes mellitus treated with insulin ([place])    followed by [facility] in [place]  . Wears dentures    upper   . Wears glasses    "wears to help double vision"     Past  Surgical History:  Procedure Laterality Date  . COLONOSCOPY WITH PROPOFOL N/A 08/16/2019   Procedure: COLONOSCOPY WITH PROPOFOL;  Surgeon: Lin Landsman, MD;  Location: [facility] ENDOSCOPY;  Service: Gastroenterology;  Laterality: N/A;  . CORONARY ARTERY BYPASS GRAFT N/A 04/30/2019   Procedure: CORONARY ARTERY BYPASS  GRAFTING (CABG) TIMES THREE USING LEFT INTERNAL MAMMARY AND RIGHT GREATER SAPHENOUS VEIN HARVESTED ENDOSCOPICALLY. MAMMARY ARTERY TO LAD, SAPHENOUS VEIN GRAFT TO  OM1, SAPHENOUS VEING GRAFT TO RIGHT. BIOPSY OF MEDIASTINAL TISSUE PLACEMENT OF RIGHT FEMORAL A-LINE;  Surgeon: Grace Isaac, MD;  Location: [place];  Service: Open Heart Surgery;  Laterality: N/A;  . CYSTOSCOPY N/A 07/14/2018   Procedure: CYSTOSCOPY;  Surgeon: Kathie Rhodes, MD;  Location: [facility];  Service: Urology;  Laterality: N/A;  no seeds in bladder per Dr Karsten Ro  . ESOPHAGOGASTRODUODENOSCOPY (EGD) WITH PROPOFOL N/A 08/16/2019   Procedure: ESOPHAGOGASTRODUODENOSCOPY (EGD) WITH PROPOFOL;  Surgeon: Lin Landsman, MD;  Location: [place];  Service: Gastroenterology;  Laterality: N/A;  . LEFT HEART CATH AND CORONARY ANGIOGRAPHY N/A 04/27/2019   Procedure: LEFT HEART CATH AND CORONARY ANGIOGRAPHY;  Surgeon: Wellington Hampshire, MD;  Location: [place] CV LAB;  Service: Cardiovascular;  Laterality: N/A;  . PROSTATE BIOPSY  02-21-2018  dr Karsten Ro in office  . RADIOACTIVE SEED IMPLANT N/A 07/14/2018   Procedure: RADIOACTIVE SEED IMPLANT/BRACHYTHERAPY IMPLANT;  Surgeon: Kathie Rhodes, MD;  Location: [facility];  Service: Urology;  Laterality: N/A;  77 seeds   . SHOULDER ARTHROSCOPY Left 2018   in [place]   "remove spur"  . SPACE OAR INSTILLATION N/A 07/14/2018   Procedure: SPACE OAR INSTILLATION;  Surgeon: Kathie Rhodes, MD;  Location: [facility];  Service: Urology;  Laterality: N/A;  . TEE WITHOUT CARDIOVERSION N/A 04/30/2019   Procedure: TRANSESOPHAGEAL ECHOCARDIOGRAM (TEE);  Surgeon: Grace Isaac, MD;  Location: [place];  Service: Open Heart Surgery;  Laterality: N/A;    Social History   Socioeconomic History  . Marital status: Married    Spouse name: Not on file  . Number of children: Not on file  . Years of education: Not on file  . Highest education level: Not  on file  Occupational History  . Not on file  Tobacco Use  . Smoking status: Never Smoker  . Smokeless tobacco: Never Used  Vaping Use  . Vaping Use: Never used  Substance and Sexual Activity  . Alcohol use: Not Currently  . Drug use: Never  . Sexual activity: Not Currently  Other Topics Concern  . Not on file  Social History Narrative  . Not on file   Social Determinants of Health   Financial Resource Strain:   . Difficulty of Paying Living Expenses: Not on file  Food Insecurity:   . Worried About Charity fundraiser in the Last Year: Not on file  . Ran Out of Food in the Last Year: Not on file  Transportation Needs:   . Lack of Transportation (Medical): Not on file  . Lack of Transportation (Non-Medical): Not on file  Physical Activity:   . Days of Exercise per Week: Not on file  . Minutes of Exercise per Session: Not on file  Stress:   . Feeling of Stress : Not on file  Social Connections:   . Frequency of Communication with Friends and Family: Not on file  . Frequency of Social Gatherings with Friends and Family: Not on file  . Attends Religious Services: Not on file  .  Active Member of Clubs or Organizations: Not on file  . Attends Archivist Meetings: Not on file  . Marital Status: Not on file  Intimate Partner Violence:   . Fear of Current or Ex-Partner: Not on file  . Emotionally Abused: Not on file  . Physically Abused: Not on file  . Sexually Abused: Not on file    Family History  Problem Relation Age of Onset  . Stroke Mother   . Diabetes Father   . Hypertension Father   . Diabetes Sister   . Cancer Neg Hx      Current Outpatient Medications:  .  acetaminophen (TYLENOL) 325 MG tablet, Take 2 tablets (650 mg total) by mouth every 6 (six) hours as needed for mild pain., Disp:  , Rfl:  .  amLODipine (NORVASC) 10 MG tablet, Take 10 mg by mouth daily., Disp: , Rfl:  .  aspirin EC 81 MG tablet, Take 1 tablet (81 mg total) by mouth daily.  Swallow whole., Disp: 30 tablet, Rfl: 11 .  calcium carbonate (TUMS - DOSED IN MG ELEMENTAL CALCIUM) 500 MG chewable tablet, Chew 2-3 tablets by mouth daily as needed for indigestion or heartburn., Disp: , Rfl:  .  carvedilol (COREG) 6.25 MG tablet, Take 3.125 mg by mouth 2 (two) times daily with a meal., Disp: , Rfl:  .  cloNIDine (CATAPRES) 0.3 MG tablet, Take 1 tablet (0.3 mg total) by mouth 2 (two) times daily., Disp: , Rfl:  .  doxazosin (CARDURA) 4 MG tablet, Take 2 mg by mouth at bedtime. , Disp: , Rfl:  .  DULoxetine (CYMBALTA) 60 MG capsule, Take 60 mg by mouth daily., Disp: , Rfl:  .  famotidine (PEPCID) 20 MG tablet, Take 20 mg by mouth 2 (two) times daily., Disp: , Rfl:  .  guaiFENesin (MUCINEX) 600 MG 12 hr tablet, Take 1 tablet (600 mg total) by mouth 2 (two) times daily as needed for cough or to loosen phlegm., Disp: , Rfl:  .  insulin aspart protamine - aspart (NOVOLOG MIX 70/30 FLEXPEN) (70-30) 100 UNIT/ML FlexPen, Inject 0.2 mLs (20 Units total) into the skin 2 (two) times daily before a meal. As the patient and I discussed, start with a lower dose of Insulin as he has not been eating well. Titrate Insulin upward to dose taken prior to surgery (60 units bid) as tolerates., Disp: 15 mL, Rfl: 11 .  Iron-FA-B Cmp-C-Biot-Probiotic (FUSION PLUS) CAPS, Take 1 tablet by mouth daily., Disp: 30 capsule, Rfl: 2 .  losartan (COZAAR) 50 MG tablet, Take 1 tablet (50 mg total) by mouth daily., Disp: 30 tablet, Rfl: 1 .  metFORMIN (GLUCOPHAGE-XR) 500 MG 24 hr tablet, Take 1,000 mg by mouth in the morning and at bedtime., Disp: , Rfl:  .  Multiple Vitamins-Minerals (MULTIVITAMIN ADULT PO), Take 1 tablet by mouth daily. , Disp: , Rfl:  .  omeprazole (PRILOSEC) 20 MG capsule, Take 20 mg by mouth 2 (two) times daily. , Disp: , Rfl:  .  rosuvastatin (CRESTOR) 40 MG tablet, Take 1 tablet (40 mg total) by mouth daily., Disp: 30 tablet, Rfl: 1 .  Semaglutide (OZEMPIC, 1 MG/DOSE, [place]), Inject 1 mg into the  skin every Sunday. , Disp: , Rfl:   Physical exam:  Vitals:   10/26/19 1448  BP: 139/84  Pulse: 90  Resp: 16  Temp: 97.8 F (36.6 C)  TempSrc: Tympanic  SpO2: 96%  Weight: 216 lb 8 oz (98.2 kg)   Physical Exam HENT:  Head: Normocephalic and atraumatic.  Eyes:     Pupils: Pupils are equal, round, and reactive to light.  Cardiovascular:     Rate and Rhythm: Normal rate and regular rhythm.     Heart sounds: Normal heart sounds.  Pulmonary:     Effort: Pulmonary effort is normal.     Breath sounds: Normal breath sounds.  Abdominal:     General: Bowel sounds are normal.     Palpations: Abdomen is soft.  Musculoskeletal:     Cervical back: Normal range of motion.  Skin:    General: Skin is warm and dry.  Neurological:     Mental Status: He is alert and oriented to person, place, and time.      CMP Latest Ref Rng & Units 05/23/2019  Glucose 65 - 99 mg/dL 99  BUN 8 - 27 mg/dL 22  Creatinine 0.76 - 1.27 mg/dL 0.85  Sodium 134 - 144 mmol/L 140  Potassium 3.5 - 5.2 mmol/L 4.9  Chloride 96 - 106 mmol/L 101  CO2 20 - 29 mmol/L 21  Calcium 8.6 - 10.2 mg/dL 9.2  Total Protein 6.5 - 8.1 g/dL -  Total Bilirubin 0.3 - 1.2 mg/dL -  Alkaline Phos 38 - 126 U/L -  AST 15 - 41 U/L -  ALT 0 - 44 U/L -   CBC Latest Ref Rng & Units 10/26/2019  WBC 4.0 - 10.5 K/uL 8.4  Hemoglobin 13.0 - 17.0 g/dL 13.0  Hematocrit 39 - 52 % 39.8  Platelets 150 - 400 K/uL 238     Assessment and plan- Patient is a 73 y.o. male with history of iron deficiency anemia here for a routine follow-up  Patient has not required any IV iron so far.  He has been on oral iron his hemoglobin is improved from 11 in April 20 21-13 today.  Iron studies Show persistent iron deficiency with a ferritin of 15 and iron normal TIBC of 449.  However given that he is not anemic he will continue to take oral iron I will repeat CBC ferritin and iron studies in 3 in 6 months.  He remains persistently iron deficient I will  consider giving him IV iron at that time   Visit Diagnosis 1. Iron deficiency anemia, unspecified iron deficiency anemia type      Dr. Randa Evens, MD, MPH [facility] [phone] 10/29/2019 4:03 PM

## 2020-01-22 ENCOUNTER — Ambulatory Visit: Payer: PPO | Admitting: Gastroenterology

## 2020-01-23 ENCOUNTER — Ambulatory Visit: Payer: PPO | Admitting: Cardiovascular Disease

## 2020-01-28 ENCOUNTER — Other Ambulatory Visit: Payer: PPO

## 2020-01-30 ENCOUNTER — Telehealth: Payer: Self-pay | Admitting: *Deleted

## 2020-01-30 NOTE — Telephone Encounter (Signed)
I called and checked with pt about the approval for the [facility] to cover pt's labs for tomorrow. I have never done anything like this for the pt. I see that he has approval for GI and cardiac here from [place]. I checked with Luann and asked her about approval for here at cancer center through [place]. She says she only has HTA and so far the pt has not had to pay anything for the services here. The pt. Says that he always involves [facility] and gets approval. I told him that I can cancel the appt and then call [facility] and once I get an answer for him. He is good with this. I called [phone] ext [phone] and immediately got voicemail that states that VA will call back within 24 hours if you leave name and last 4 digits of social security number. I left name, DOB, last 4 of social security and what we needed and a telephone number to rtn call

## 2020-01-31 ENCOUNTER — Inpatient Hospital Stay: Payer: PPO

## 2020-02-02 DIAGNOSIS — I77810 Thoracic aortic ectasia: Secondary | ICD-10-CM

## 2020-02-02 HISTORY — DX: Thoracic aortic ectasia: I77.810

## 2020-02-12 ENCOUNTER — Ambulatory Visit: Payer: PPO | Admitting: Cardiovascular Disease

## 2020-03-06 ENCOUNTER — Other Ambulatory Visit: Payer: Self-pay

## 2020-03-06 ENCOUNTER — Ambulatory Visit (INDEPENDENT_AMBULATORY_CARE_PROVIDER_SITE_OTHER): Payer: PPO | Admitting: Gastroenterology

## 2020-03-06 ENCOUNTER — Encounter: Payer: Self-pay | Admitting: Gastroenterology

## 2020-03-06 VITALS — BP 133/75 | HR 105 | Temp 98.1°F | Ht 71.0 in | Wt 215.5 lb

## 2020-03-06 DIAGNOSIS — D500 Iron deficiency anemia secondary to blood loss (chronic): Secondary | ICD-10-CM

## 2020-03-06 DIAGNOSIS — K219 Gastro-esophageal reflux disease without esophagitis: Secondary | ICD-10-CM

## 2020-03-06 NOTE — Progress Notes (Signed)
Cephas Darby, MD [address]  [place]  [place], [place] [address]  Main: [phone]  Fax: [phone]    Gastroenterology Consultation  Referring Provider:     [place] [facility] Primary Care Physician:  [facility] Primary Gastroenterologist:  Dr. Cephas Darby Reason for Consultation:     History of GERD, frequent burping, abdominal bloating        HPI:   Cristian Martin is a 75 y.o. male referred by Dr[facility] Domingo [facility]  for consultation & management of chronic GERD, frequent burping and abdominal bloating.  Chronic GERD: Patient reports that he has been having long history of reflux symptoms for which he has been taking omeprazole 20 mg 2 times a day before meals.  Currently, he reports that his heartburn is under control.  However reports frequent burping almost on a daily basis.  He also reports abdominal bloating.  He is also on Pepcid 20 mg 2 times a day.  Patient recently underwent CABG in 05/2019, on aspirin 325 mg daily.  Closely followed by his cardiologist, Dr. Fletcher Anon.  He is currently undergoing cardiac rehab.  He has preserved cardiac function  He is found to have normocytic anemia  He also reports irregular bowel habits, bowel movements every 2 days, worsening of abdominal bloating.  He does acknowledge drinking diet sodas which she has cut back significantly.  He has history of poorly controlled diabetes, currently on insulin and Metformin.  His hemoglobin A1c has significantly improved  Follow-up visit 10/23/2019 Patient has iron deficiency anemia, underwent upper endoscopy which revealed small hiatal hernia, gastric biopsies negative for H. pylori.  Underwent colonoscopy which revealed small tubular adenomas only.  His ferritin less than 20, currently on oral iron.  Referred to Dr. Janese Banks, hematologist, patient wanted to try oral iron.  He has follow-up with her on Friday this week with follow-up labs.  He reports that his energy levels are gradually  improving.  He denies dark stools, rectal bleeding.  His main concern is irregular bowel habits, incomplete emptying he does have 1-2 small bowel movements which are very hard.  He does drink diet soda daily, less intake of water.  He has not tried any stool softener.  He underwent physical therapy which has been very helpful.  He is currently on aspirin 81 mg currently per his cardiologist  Follow-up visit 03/06/20 Patient has been doing very well overall.  He has occasional bloating depending on what he eats.  He reports having 1-2 bowel movements daily, well formed.  He reports that he had blood work done last Tuesday at [facility] and he did not receive any call regarding the results.  He is assuming, results were fine.  His iron deficiency anemia has resolved, he is no longer anemic, recent hemoglobin was 13 in September 2021.  His ferritin levels are still low.  He continues to take oral iron.  He denies any black stools.   NSAIDs: Aspirin 81 mg for history of coronary artery disease  Antiplts/Anticoagulants/Anti thrombotics: Aspirin 81 mg for history of coronary artery disease  GI Procedures: Colonoscopy in 2015, found to have hyperplastic polyp in the rectum, full report not available.  Patient reports that he is due for another colonoscopy  EGD and colonoscopy 08/16/2019  - Normal duodenal bulb and second portion of the duodenum. - Small hiatal hernia. - Normal stomach. Biopsied. - Esophagogastric landmarks identified. - Normal gastroesophageal junction and esophagus.  - Preparation of the colon  was fair. - Four 4 to 5 mm polyps in the transverse colon and in the cecum, removed with a cold snare. Resected and retrieved. - One diminutive polyp in the cecum, removed with a cold biopsy forceps. Resected and retrieved. - The distal rectum and anal verge are normal on retroflexion view. DIAGNOSIS:  A. STOMACH; RANDOM COLD BIOPSY:  - ANTRAL MUCOSA WITH FOCAL EROSION AND REACTIVE  FOVEOLAR HYPERPLASIA.  - OXYNTIC MUCOSA WITH PROTON PUMP INHIBITOR EFFECT.  - NEGATIVE FOR H. PYLORI, INTESTINAL METAPLASIA, DYSPLASIA, AND  MALIGNANCY.   B. COLON POLYP X 2, CECUM; COLD SNARE AND COLD BIOPSY:  - TUBULAR ADENOMAS, 2 FRAGMENTS.  - NEGATIVE FOR HIGH-GRADE DYSPLASIA AND MALIGNANCY.   C. COLON POLYP X 3, TRANSVERSE; COLD SNARE:  - TUBULAR ADENOMAS, 4 FRAGMENTS.  - NEGATIVE FOR HIGH-GRADE DYSPLASIA AND MALIGNANCY.   Past Medical History:  Diagnosis Date  . Agent orange exposure   . Coronary artery disease    a. 04/2019 Cath: LM 99, LAD 80p, D2 70, LCX 50ost/p, RCA 70p, EF 55-65%; b. 04/2019 CABG x 3 (LIMA to LAD, SVG to OM1, SVG to RCA).  . Diabetic peripheral neuropathy ([place])   . Diastolic dysfunction    a 04/2019 Echo: EF 55-60%, mod conc LVH, Gr1 DD. Nl RV fxn. Mild Ao sclerosis w/o stenosis.  Marland Kitchen GERD (gastroesophageal reflux disease)   . Hyperlipidemia   . Hyperplasia of prostate with lower urinary tract symptoms (LUTS)   . Hypertension   . Prostate cancer [facility]) urologist-- dr ottelin/  oncologist-- dr Tammi Klippel   dx 02-21-2018  -- Stage T1c, Gleason 3+4  . Type 2 diabetes mellitus treated with insulin ([place])    followed by [facility] in [place]  . Wears dentures    upper   . Wears glasses    "wears to help double vision"    Past Surgical History:  Procedure Laterality Date  . COLONOSCOPY WITH PROPOFOL N/A 08/16/2019   Procedure: COLONOSCOPY WITH PROPOFOL;  Surgeon: Lin Landsman, MD;  Location: [facility] END[facility]COPY;  Service: Gastroenterology;  Laterality: N/A;  . CORONARY ARTERY BYPASS GRAFT N/A 04/30/2019   Procedure: CORONARY ARTERY BYPASS GRAFTING (CABG) TIMES THREE USING LEFT INTERNAL MAMMARY AND RIGHT GREATER SAPHENOUS VEIN HARVESTED ENDOSCOPICALLY. MAMMARY ARTERY TO LAD, SAPHENOUS VEIN GRAFT TO  OM1, SAPHENOUS VEING GRAFT TO RIGHT. BIOPSY OF MEDIASTINAL TISSUE PLACEMENT OF RIGHT FEMORAL A-LINE;  Surgeon: Grace Isaac, MD;  Location: [place];  Service: Open  Heart Surgery;  Laterality: N/A;  . CYSTOSCOPY N/A 07/14/2018   Procedure: CYSTOSCOPY;  Surgeon: Kathie Rhodes, MD;  Location[facility];  Service: Urology;  Laterality: N/A;  no seeds in bladder per Dr Karsten Ro  . ESOPHAGOGASTRODUODENOSCOPY (EGD) WITH PROPOFOL N/A 08/16/2019   Procedure: ESOPHAGOGASTRODUODENOSCOPY (EGD) WITH PROPOFOL;  Surgeon: Lin Landsman, MD;  Location: [place];  Service: Gastroenterology;  Laterality: N/A;  . LEFT HEART CATH AND CORONARY ANGIOGRAPHY N/A 04/27/2019   Procedure: LEFT HEART CATH AND CORONARY ANGIOGRAPHY;  Surgeon: Wellington Hampshire, MD;  Location: [place] CV LAB;  Service: Cardiovascular;  Laterality: N/A;  . PROSTATE BIOPSY  02-21-2018  dr Karsten Ro in office  . RADIOACTIVE SEED IMPLANT N/A 07/14/2018   Procedure: RADIOACTIVE SEED IMPLANT/BRACHYTHERAPY IMPLANT;  Surgeon: Kathie Rhodes, MD;  Location[facility];  Service: Urology;  Laterality: N/A;  77 seeds   . SHOULDER ARTHROSCOPY Left 2018   in [place]   "remove spur"  . SPACE OAR INSTILLATION N/A 07/14/2018   Procedure: SPACE OAR  INSTILLATION;  Surgeon: Kathie Rhodes, MD;  Location: [place];  Service: Urology;  Laterality: N/A;  . TEE WITHOUT CARDIOVERSION N/A 04/30/2019   Procedure: TRANSESOPHAGEAL ECHOCARDIOGRAM (TEE);  Surgeon: Grace Isaac, MD;  Location: [place];  Service: Open Heart Surgery;  Laterality: N/A;    Current Outpatient Medications:  .  acetaminophen (TYLENOL) 325 MG tablet, Take 2 tablets (650 mg total) by mouth every 6 (six) hours as needed for mild pain., Disp:  , Rfl:  .  amLODipine (NORVASC) 10 MG tablet, Take 10 mg by mouth daily., Disp: , Rfl:  .  aspirin EC 81 MG tablet, Take 1 tablet (81 mg total) by mouth daily. Swallow whole., Disp: 30 tablet, Rfl: 11 .  calcium carbonate (TUMS - DOSED IN MG ELEMENTAL CALCIUM) 500 MG chewable tablet, Chew 2-3 tablets by mouth daily as needed for indigestion or heartburn., Disp:  , Rfl:  .  carvedilol (COREG) 6.25 MG tablet, Take 3.125 mg by mouth 2 (two) times daily with a meal., Disp: , Rfl:  .  cloNIDine (CATAPRES) 0.3 MG tablet, Take 1 tablet (0.3 mg total) by mouth 2 (two) times daily., Disp: , Rfl:  .  doxazosin (CARDURA) 4 MG tablet, Take 2 mg by mouth at bedtime. , Disp: , Rfl:  .  DULoxetine (CYMBALTA) 60 MG capsule, Take 60 mg by mouth daily., Disp: , Rfl:  .  escitalopram (LEXAPRO) 5 MG tablet, TAKE TWO TABLETS BY MOUTH ONCE EVERY DAY, Disp: , Rfl:  .  famotidine (PEPCID) 20 MG tablet, Take 20 mg by mouth 2 (two) times daily., Disp: , Rfl:  .  gabapentin (NEURONTIN) 600 MG tablet, Take 1 tablet by mouth at bedtime., Disp: , Rfl:  .  glimepiride (AMARYL) 4 MG tablet, Take by mouth., Disp: , Rfl:  .  guaiFENesin (MUCINEX) 600 MG 12 hr tablet, Take 1 tablet (600 mg total) by mouth 2 (two) times daily as needed for cough or to loosen phlegm., Disp: , Rfl:  .  insulin aspart protamine - aspart (NOVOLOG MIX 70/30 FLEXPEN) (70-30) 100 UNIT/ML FlexPen, Inject 0.2 mLs (20 Units total) into the skin 2 (two) times daily before a meal. As the patient and I discussed, start with a lower dose of Insulin as he has not been eating well. Titrate Insulin upward to dose taken prior to surgery (60 units bid) as tolerates., Disp: 15 mL, Rfl: 11 .  Iron-FA-B Cmp-C-Biot-Probiotic (FUSION PLUS) CAPS, Take 1 tablet by mouth daily., Disp: 30 capsule, Rfl: 2 .  losartan (COZAAR) 50 MG tablet, Take 1 tablet (50 mg total) by mouth daily., Disp: 30 tablet, Rfl: 1 .  metFORMIN (GLUCOPHAGE-XR) 500 MG 24 hr tablet, Take 1,000 mg by mouth in the morning and at bedtime., Disp: , Rfl:  .  metoprolol tartrate (LOPRESSOR) 50 MG tablet, TAKE ONE-HALF TABLET BY MOUTH TWO TIMES A DAY, Disp: , Rfl:  .  Multiple Vitamins-Minerals (MULTIVITAMIN ADULT PO), Take 1 tablet by mouth daily. , Disp: , Rfl:  .  nitroGLYCERIN (NITROSTAT) 0.4 MG SL tablet, DISSOLVE ONE TABLET UNDER THE TONGUE EVERY 5 MINUTES AS NEEDED  FOR CHEST PAIN (REPEAT EVERY 5 MINUTES IF NEEDED FOR A TOTAL OF 3 TABLETS IN 15 MINUTES. IF NO RELIEF, CALL 911.), Disp: , Rfl:  .  omeprazole (PRILOSEC) 20 MG capsule, Take 20 mg by mouth 2 (two) times daily. , Disp: , Rfl:  .  rosuvastatin (CRESTOR) 40 MG tablet, Take 1 tablet (40 mg total) by mouth daily., Disp:  30 tablet, Rfl: 1 .  Semaglutide (OZEMPIC, 1 MG/DOSE, [place]), Inject 1 mg into the skin every Sunday. , Disp: , Rfl:  .  silodosin (RAPAFLO) 8 MG CAPS capsule, Take 8 mg by mouth at bedtime., Disp: , Rfl:  .  simvastatin (ZOCOR) 40 MG tablet, Take 0.5 tablets by mouth at bedtime., Disp: , Rfl:    Family History  Problem Relation Age of Onset  . Stroke Mother   . Diabetes Father   . Hypertension Father   . Diabetes Sister   . Cancer Neg Hx      Social History   Tobacco Use  . Smoking status: Never Smoker  . Smokeless tobacco: Never Used  Vaping Use  . Vaping Use: Never used  Substance Use Topics  . Alcohol use: Not Currently  . Drug use: Never    Allergies as of 03/06/2020 - Review Complete 03/06/2020  Allergen Reaction Noted  . Ace inhibitors Other (See Comments) 08/01/2013    Review of Systems:    All systems reviewed and negative except where noted in HPI.   Physical Exam:  BP 133/75 (BP Location: Left Arm, Patient Position: Sitting, Cuff Size: Normal)   Pulse (!) 105   Temp 98.1 F (36.7 C) (Oral)   Ht 5\' 11"  (1.803 m)   Wt 215 lb 8 oz (97.8 kg)   BMI 30.06 kg/m  No LMP for male patient.  General:   Alert,  Well-developed, well-nourished, pleasant and cooperative in NAD Head:  Normocephalic and atraumatic. Eyes:  Sclera clear, no icterus.   Conjunctiva pink. Ears:  Normal auditory acuity. Nose:  No deformity, discharge, or lesions. Mouth:  No deformity or lesions,oropharynx pink & moist. Neck:  Supple; no masses or thyromegaly. Lungs:  Respirations even and unlabored.  Clear throughout to auscultation.   No wheezes, crackles, or rhonchi. No acute  distress. Heart:  Regular rate and rhythm; no murmurs, clicks, rubs, or gallops. Abdomen:  Normal bowel sounds. Soft, non-tender and mildly distended without masses, hepatosplenomegaly or hernias noted.  No guarding or rebound tenderness.   Rectal: Not performed Msk:  Symmetrical without gross deformities. Good, equal movement & strength bilaterally. Pulses:  Normal pulses noted. Extremities:  No clubbing or edema.  No cyanosis. Neurologic:  Alert and oriented x3;  grossly normal neurologically. Skin:  Intact without significant lesions or rashes. No jaundice. Psych:  Alert and cooperative. Normal mood and affect.  Imaging Studies: No abdominal imaging  Assessment and Plan:   Cristian Martin is a 75 y.o. male with history of metabolic syndrome, coronary artery disease, triple-vessel bypass in 05/2019 previously on aspirin 325 mg daily, switched to 81 mg daily, chronic GERD, constipation and abdominal bloating, iron deficiency anemia, history of prostate cancer s/p radiation implant  Chronic GERD and burping EGD revealed small hiatal hernia, no evidence of Barrett's esophagus Continue omeprazole 20 mg twice daily before meals Continue antireflux lifestyle  Chronic constipation and abdominal bloating: Resolved Continue high-fiber diet, stool softener such as MiraLAX as needed  Tubular adenomas of the colon Recommend surveillance colonoscopy in 2024  Iron deficiency anemia: Anemia has resolved Ferritin levels are low, continue oral iron Patient is responding to oral iron as anemia has resolved If persistently iron deficient in next 6 months, will perform video capsule endoscopy   Follow up in 6 months   Cephas Darby, MD

## 2020-04-28 ENCOUNTER — Inpatient Hospital Stay: Payer: PPO

## 2020-04-28 ENCOUNTER — Inpatient Hospital Stay: Payer: PPO | Admitting: Oncology

## 2020-09-03 ENCOUNTER — Ambulatory Visit: Payer: Non-veteran care | Admitting: Gastroenterology

## 2020-09-17 ENCOUNTER — Ambulatory Visit: Payer: Non-veteran care | Admitting: Gastroenterology

## 2020-12-09 ENCOUNTER — Other Ambulatory Visit: Payer: Self-pay | Admitting: Internal Medicine

## 2020-12-09 DIAGNOSIS — G8929 Other chronic pain: Secondary | ICD-10-CM

## 2020-12-09 DIAGNOSIS — M545 Low back pain, unspecified: Secondary | ICD-10-CM

## 2020-12-16 ENCOUNTER — Ambulatory Visit
Admission: RE | Admit: 2020-12-16 | Discharge: 2020-12-16 | Disposition: A | Discharge status: Home / Self Care | Payer: No Typology Code available for payment source | Source: Ambulatory Visit | Attending: Internal Medicine | Admitting: Internal Medicine

## 2020-12-16 ENCOUNTER — Other Ambulatory Visit: Payer: Self-pay

## 2020-12-16 DIAGNOSIS — G8929 Other chronic pain: Secondary | ICD-10-CM | POA: Diagnosis present

## 2020-12-16 DIAGNOSIS — M545 Low back pain, unspecified: Secondary | ICD-10-CM | POA: Insufficient documentation

## 2021-03-03 ENCOUNTER — Encounter: Payer: Self-pay | Admitting: Student in an Organized Health Care Education/Training Program

## 2021-03-03 ENCOUNTER — Ambulatory Visit
Payer: No Typology Code available for payment source | Attending: Student in an Organized Health Care Education/Training Program | Admitting: Student in an Organized Health Care Education/Training Program

## 2021-03-03 ENCOUNTER — Other Ambulatory Visit: Payer: Self-pay

## 2021-03-03 VITALS — BP 73/51 | HR 86 | Temp 97.2°F | Resp 16 | Ht 69.0 in | Wt 212.0 lb

## 2021-03-03 DIAGNOSIS — M47816 Spondylosis without myelopathy or radiculopathy, lumbar region: Secondary | ICD-10-CM | POA: Diagnosis present

## 2021-03-03 DIAGNOSIS — M5416 Radiculopathy, lumbar region: Secondary | ICD-10-CM | POA: Insufficient documentation

## 2021-03-03 DIAGNOSIS — M5136 Other intervertebral disc degeneration, lumbar region: Secondary | ICD-10-CM | POA: Insufficient documentation

## 2021-03-03 DIAGNOSIS — M51369 Other intervertebral disc degeneration, lumbar region without mention of lumbar back pain or lower extremity pain: Secondary | ICD-10-CM | POA: Insufficient documentation

## 2021-03-03 DIAGNOSIS — G8929 Other chronic pain: Secondary | ICD-10-CM

## 2021-03-03 DIAGNOSIS — M48062 Spinal stenosis, lumbar region with neurogenic claudication: Secondary | ICD-10-CM | POA: Diagnosis not present

## 2021-03-03 DIAGNOSIS — G894 Chronic pain syndrome: Secondary | ICD-10-CM

## 2021-03-03 NOTE — Patient Instructions (Signed)
You have been referred to neurosurgery. 

## 2021-03-03 NOTE — Progress Notes (Signed)
Safety precautions to be maintained throughout the outpatient stay will include: orient to surroundings, keep bed in low position, maintain call bell within reach at all times, provide assistance with transfer out of bed and ambulation.  

## 2021-03-03 NOTE — Progress Notes (Signed)
Patient: Cristian Martin  Service Category: E/M  Provider: Gillis Santa, MD  DOB: Apr 27, 1945  DOS: 03/03/2021  Referring Provider: [facility]  MRN: [id number]  Setting: Ambulatory outpatient  [facility]  Type: New Patient  Specialty: Interventional Pain Management    Location: Office  Delivery: Face-to-face     Primary Reason(s) for Visit: Encounter for initial evaluation of one or more chronic problems (new to examiner) potentially causing chronic pain, and posing a threat to normal musculoskeletal function. (Level of risk: High) CC: Back Pain (lower) and Knee Pain (bilat)  HPI  Cristian Martin is a 76 y.o. year old, male patient, who comes for the first time to our practice referred by [facility] for our initial evaluation of his chronic pain. He has Benign essential hypertension; Chronic painful diabetic neuropathy ([place]); DM (diabetes mellitus) type II controlled with renal manifestation ([place]); GERD (gastroesophageal reflux disease); Hx of colonic polyps; Hyperlipidemia, mixed; Malignant neoplasm of prostate ([place]); Unstable angina (HCC); S/P CABG x 3; Coronary artery disease; Iron deficiency anemia; Spinal stenosis, lumbar region, with neurogenic claudication; Chronic radicular lumbar pain; Lumbar degenerative disc disease; Lumbar facet arthropathy; and Chronic pain syndrome on their problem list. Today he comes in for evaluation of his Back Pain (lower) and Knee Pain (bilat)  Pain Assessment: Location: Mid, Lower Back Radiating: down legs bilat Onset: More than a month ago Duration: Chronic pain Quality: Aching Severity: 8 /10 (subjective, self-reported pain score)  Effect on ADL: limits daily activities Timing: Constant Modifying factors: nothing BP: (!) 73/51   HR: 86  Onset and Duration: Gradual Cause of pain: Unknown Severity: Getting worse, NAS-11 at its worse: 10/10, NAS-11 at its best: 08/10, NAS-11 now: 09/10, and NAS-11 on the average: 09/10 Timing: Morning,  Afternoon, Night, and During activity or exercise Aggravating Factors: Bending, Climbing, Kneeling, Lifiting, Prolonged sitting, Prolonged standing, Twisting, Walking, Walking uphill, and Walking downhill Alleviating Factors: Lying down, Medications, Resting, and Sitting Associated Problems: Constipation, Dizziness, Fatigue, Inability to control bladder (urine), Inability to control bowel, Numbness, Swelling, Tingling, Weakness, Pain that wakes patient up, and Pain that does not allow patient to sleep Quality of Pain: Aching, Agonizing, Annoying, Deep, Disabling, Distressing, Dreadful, Getting longer, Nagging, Pressure-like, and Pulsating Previous Examinations or Tests: Bone scan, MRI scan, and X-rays Previous Treatments: Epidural steroid injections and Physical Therapy  76 year old man being referred for chronic back and bilateral leg pain/weakness (primarily left leg pain). Symptoms started without any identifiable inciting event several months back. He does complain of pain as well as some paresthesias in the left leg, significantly exacerbated whenever he stands or walks for any length of time. His left leg symptoms are somewhat improved when he is sitting. He does say that when he stands up and punches forward, he does get some relief in his leg symptoms. He does also have a more constant pressure-type pain in his lower back which is again worsened when he stands or walks. He does have a history of diabetes and has some neuropathy in both his feet. He has been taking some gabapentin which has not provided any real improvement in his back or leg pain although it does help somewhat with his diabetic neuropathy.Of note, the patient does have a history of coronary artery bypass surgery a year to ago. He is currently on baby aspirin. He also has a history of hypertension and diabetes. He has a history of prostate cancer. No history of lung, liver, or kidney disease.  Not on  chronic opioid therapy. Status  post lumbar epidural steroid injection on 02/10/2021.  This was done at [facility].  Neither him nor his wife were pleased with their encounter there.  He is experiencing more falls due to weakness of his legs.  Meds   Current Outpatient Medications:    acetaminophen (TYLENOL) 325 MG tablet, Take 2 tablets (650 mg total) by mouth every 6 (six) hours as needed for mild pain., Disp:  , Rfl:    aspirin EC 81 MG tablet, Take 1 tablet (81 mg total) by mouth daily. Swallow whole., Disp: 30 tablet, Rfl: 11   calcium carbonate (TUMS - DOSED IN MG ELEMENTAL CALCIUM) 500 MG chewable tablet, Chew 2-3 tablets by mouth daily as needed for indigestion or heartburn., Disp: , Rfl:    carvedilol (COREG) 6.25 MG tablet, Take 3.125 mg by mouth 2 (two) times daily with a meal., Disp: , Rfl:    cloNIDine (CATAPRES) 0.3 MG tablet, Take 1 tablet (0.3 mg total) by mouth 2 (two) times daily., Disp: , Rfl:    doxazosin (CARDURA) 4 MG tablet, Take 2 mg by mouth at bedtime. , Disp: , Rfl:    DULoxetine (CYMBALTA) 60 MG capsule, Take 60 mg by mouth daily., Disp: , Rfl:    escitalopram (LEXAPRO) 5 MG tablet, TAKE TWO TABLETS BY MOUTH ONCE EVERY DAY, Disp: , Rfl:    famotidine (PEPCID) 20 MG tablet, Take 20 mg by mouth 2 (two) times daily., Disp: , Rfl:    gabapentin (NEURONTIN) 600 MG tablet, Take 1 tablet by mouth at bedtime., Disp: , Rfl:    glimepiride (AMARYL) 4 MG tablet, Take by mouth., Disp: , Rfl:    guaiFENesin (MUCINEX) 600 MG 12 hr tablet, Take 1 tablet (600 mg total) by mouth 2 (two) times daily as needed for cough or to loosen phlegm., Disp: , Rfl:    amLODipine (NORVASC) 10 MG tablet, Take 10 mg by mouth daily. (Patient not taking: Reported on 03/03/2021), Disp: , Rfl:    insulin aspart protamine - aspart (NOVOLOG MIX 70/30 FLEXPEN) (70-30) 100 UNIT/ML FlexPen, Inject 0.2 mLs (20 Units total) into the skin 2 (two) times daily before a meal. As the patient and I discussed, start with a lower dose  of Insulin as he has not been eating well. Titrate Insulin upward to dose taken prior to surgery (60 units bid) as tolerates., Disp: 15 mL, Rfl: 11   Iron-FA-B Cmp-C-Biot-Probiotic (FUSION PLUS) CAPS, Take 1 tablet by mouth daily., Disp: 30 capsule, Rfl: 2   losartan (COZAAR) 50 MG tablet, Take 1 tablet (50 mg total) by mouth daily., Disp: 30 tablet, Rfl: 1   metFORMIN (GLUCOPHAGE-XR) 500 MG 24 hr tablet, Take 1,000 mg by mouth in the morning and at bedtime., Disp: , Rfl:    metoprolol tartrate (LOPRESSOR) 50 MG tablet, TAKE ONE-HALF TABLET BY MOUTH TWO TIMES A DAY, Disp: , Rfl:    Multiple Vitamins-Minerals (MULTIVITAMIN ADULT PO), Take 1 tablet by mouth daily. , Disp: , Rfl:    nitroGLYCERIN (NITROSTAT) 0.4 MG SL tablet, DISSOLVE ONE TABLET UNDER THE TONGUE EVERY 5 MINUTES AS NEEDED FOR CHEST PAIN (REPEAT EVERY 5 MINUTES IF NEEDED FOR A TOTAL OF 3 TABLETS IN 15 MINUTES. IF NO RELIEF, CALL 911.), Disp: , Rfl:    omeprazole (PRILOSEC) 20 MG capsule, Take 20 mg by mouth 2 (two) times daily. , Disp: , Rfl:    rosuvastatin (CRESTOR) 40 MG tablet, Take 1 tablet (40 mg total) by mouth  daily., Disp: 30 tablet, Rfl: 1   Semaglutide (OZEMPIC, 1 MG/DOSE, [place]), Inject 1 mg into the skin every Sunday. , Disp: , Rfl:    silodosin (RAPAFLO) 8 MG CAPS capsule, Take 8 mg by mouth at bedtime., Disp: , Rfl:    simvastatin (ZOCOR) 40 MG tablet, Take 0.5 tablets by mouth at bedtime., Disp: , Rfl:   Imaging Review  Lumbosacral Imaging: Lumbar MR wo contrast: Results for orders placed during the hospital encounter of 12/16/20  MR LUMBAR SPINE WO CONTRAST  Narrative CLINICAL DATA:  Low back, left calf and bilateral buttock soreness with numbness in feet.  EXAM: MRI LUMBAR SPINE WITHOUT CONTRAST  TECHNIQUE: Multiplanar, multisequence MR imaging of the lumbar spine was performed. No intravenous contrast was administered.  COMPARISON:  None.  FINDINGS: Segmentation:  Standard.  Alignment:   Physiologic.  Vertebrae: No acute fracture, evidence of discitis, or aggressive bone lesion.  Conus medullaris and cauda equina: Conus extends to the L1 level. Conus and cauda equina appear normal.  Paraspinal and other soft tissues: No acute paraspinal abnormality.  Disc levels:  Disc spaces: Degenerative disease with disc height loss at L2-3, L3-4, L4-5 and L5-S1. Reactive endplate edema at Q7-6.  T12-L1: No significant disc bulge. No neural foraminal stenosis. No central canal stenosis.  L1-L2: Minimal broad-based disc bulge. No foraminal or central canal stenosis. Mild spinal stenosis.  L2-L3: Broad-based disc bulge flattening the ventral thecal sac. Mild bilateral facet arthropathy. Mild spinal stenosis. No foraminal or central canal stenosis.  L3-L4: Broad-based disc bulge. Mild bilateral facet arthropathy. Mild spinal stenosis. Mild left foraminal stenosis. No right foraminal stenosis.  L4-L5: Broad-based disc bulge with a broad central disc protrusion. Mild bilateral facet arthropathy with ligamentum flavum infolding. Severe spinal stenosis. Severe bilateral foraminal stenosis.  L5-S1: Broad-based disc bulge. Moderate right and mild left facet arthropathy. Moderate bilateral foraminal stenosis. No central canal stenosis.  IMPRESSION: 1. Lumbar spine spondylosis as described above most severe at L4-5. 2. At L4-5 there is a broad-based disc bulge with a broad central disc protrusion. Mild bilateral facet arthropathy with ligamentum flavum infolding. Severe spinal stenosis. Severe bilateral foraminal stenosis. 3. No acute osseous injury of the lumbar spine.   Electronically Signed By: Kathreen Devoid M.D. On: 12/17/2020 10:08  Complexity Note: Imaging results reviewed. Results shared with Mr. Dorner, using Layman's terms.                         ROS  Cardiovascular: Heart trouble, Daily Aspirin intake, High blood pressure, Heart surgery, and Heart  failure Pulmonary or Respiratory: Snoring  and Temporary stoppage of breathing during sleep Neurological: Abnormal skin sensations (Peripheral Neuropathy) Psychological-Psychiatric: Anxiousness, Depressed, and Difficulty sleeping and or falling asleep Gastrointestinal: Reflux or heatburn Genitourinary: No reported renal or genitourinary signs or symptoms such as difficulty voiding or producing urine, peeing blood, non-functioning kidney, kidney stones, difficulty emptying the bladder, difficulty controlling the flow of urine, or chronic kidney disease Hematological: No reported hematological signs or symptoms such as prolonged bleeding, low or poor functioning platelets, bruising or bleeding easily, hereditary bleeding problems, low energy levels due to low hemoglobin or being anemic Endocrine: High blood sugar requiring insulin (IDDM) Rheumatologic: Constant unexplained fatigue (Chronic Fatigue Syndrome) Musculoskeletal: Negative for myasthenia gravis, muscular dystrophy, multiple sclerosis or malignant hyperthermia Work History: Retired  Allergies  Mr. Albarran is allergic to ace inhibitors.  Laboratory Chemistry Profile   Renal Lab Results  Component Value Date   BUN  22 05/23/2019   CREATININE 0.85 05/23/2019   BCR 26 (H) 05/23/2019   GFRAA 99 05/23/2019   GFRNONAA 86 05/23/2019   PROTEINUR 100 (A) 04/27/2019     Electrolytes Lab Results  Component Value Date   NA 140 05/23/2019   K 4.9 05/23/2019   CL 101 05/23/2019   CALCIUM 9.2 05/23/2019   MG 2.3 05/01/2019     Hepatic Lab Results  Component Value Date   AST 27 04/28/2019   ALT 33 04/28/2019   ALBUMIN 3.7 04/28/2019   ALKPHOS 61 04/28/2019     ID Lab Results  Component Value Date   SARSCOV2NAA NEGATIVE 08/14/2019   STAPHAUREUS NEGATIVE 04/27/2019   MRSAPCR NEGATIVE 04/27/2019     Bone No results found for: VD25OH, OI712WP8KDX, IP3825KN3, ZJ6734LP3, 25OHVITD1, 25OHVITD2, 25OHVITD3, TESTOFREE, TESTOSTERONE    Endocrine Lab Results  Component Value Date   GLUCOSE 99 05/23/2019   GLUCOSEU 50 (A) 04/27/2019   HGBA1C 7.2 (H) 04/28/2019   TSH 1.808 10/26/2019     Neuropathy Lab Results  Component Value Date   VITAMINB12 357 10/26/2019   FOLATE 21.2 10/26/2019   HGBA1C 7.2 (H) 04/28/2019     CNS No results found for: COLORCSF, APPEARCSF, RBCCOUNTCSF, WBCCSF, POLYSCSF, LYMPHSCSF, EOSCSF, PROTEINCSF, GLUCCSF, JCVIRUS, CSFOLI, IGGCSF, LABACHR, ACETBL, LABACHR, ACETBL   Inflammation (CRP: Acute   ESR: Chronic) No results found for: CRP, ESRSEDRATE, LATICACIDVEN   Rheumatology No results found for: RF, ANA, LABURIC, URICUR, LYMEIGGIGMAB, LYMEABIGMQN, HLAB27   Coagulation Lab Results  Component Value Date   INR 1.3 (H) 04/30/2019   LABPROT 15.8 (H) 04/30/2019   APTT 30 04/30/2019   PLT 238 10/26/2019     Cardiovascular Lab Results  Component Value Date   HGB 13.0 10/26/2019   HCT 39.8 10/26/2019     Screening Lab Results  Component Value Date   SARSCOV2NAA NEGATIVE 08/14/2019   COVIDSOURCE NASOPHARYNGEAL 07/11/2018   STAPHAUREUS NEGATIVE 04/27/2019   MRSAPCR NEGATIVE 04/27/2019     Cancer No results found for: CEA, CA125, LABCA2   Allergens No results found for: ALMOND, APPLE, ASPARAGUS, AVOCADO, BANANA, BARLEY, BASIL, BAYLEAF, GREENBEAN, LIMABEAN, WHITEBEAN, BEEFIGE, REDBEET, BLUEBERRY, BROCCOLI, CABBAGE, MELON, CARROT, CASEIN, CASHEWNUT, CAULIFLOWER, CELERY     Note: Lab results reviewed.  [place]  Drug: Mr. Wildes  reports no history of drug use. Alcohol:  reports that he does not currently use alcohol. Tobacco:  reports that he has never smoked. He has never used smokeless tobacco. Medical:  has a past medical history of Agent orange exposure, Coronary artery disease, Diabetic peripheral neuropathy ([place]), Diastolic dysfunction, GERD (gastroesophageal reflux disease), Hyperlipidemia, Hyperplasia of prostate with lower urinary tract symptoms (LUTS), Hypertension, Prostate  cancer [facility]) (urologist-- dr ottelin/  oncologist-- dr Tammi Klippel), Type 2 diabetes mellitus treated with insulin ([place]), Wears dentures, and Wears glasses. Family: family history includes Diabetes in his father and sister; Hypertension in his father; Stroke in his mother.  Past Surgical History:  Procedure Laterality Date   COLONOSCOPY WITH PROPOFOL N/A 08/16/2019   Procedure: COLONOSCOPY WITH PROPOFOL;  Surgeon: Lin Landsman, MD;  Location: [facility] ENDOSCOPY;  Service: Gastroenterology;  Laterality: N/A;   CORONARY ARTERY BYPASS GRAFT N/A 04/30/2019   Procedure: CORONARY ARTERY BYPASS GRAFTING (CABG) TIMES THREE USING LEFT INTERNAL MAMMARY AND RIGHT GREATER SAPHENOUS VEIN HARVESTED ENDOSCOPICALLY. MAMMARY ARTERY TO LAD, SAPHENOUS VEIN GRAFT TO  OM1, SAPHENOUS VEING GRAFT TO RIGHT. BIOPSY OF MEDIASTINAL TISSUE PLACEMENT OF RIGHT FEMORAL A-LINE;  Surgeon: Grace Isaac, MD;  Location: [place];  Service: Open Heart Surgery;  Laterality: N/A;   CYSTOSCOPY N/A 07/14/2018   Procedure: CYSTOSCOPY;  Surgeon: Kathie Rhodes, MD;  Location: [facility];  Service: Urology;  Laterality: N/A;  no seeds in bladder per Dr Karsten Ro   ESOPHAGOGASTRODUODENOSCOPY (EGD) WITH PROPOFOL N/A 08/16/2019   Procedure: ESOPHAGOGASTRODUODENOSCOPY (EGD) WITH PROPOFOL;  Surgeon: Lin Landsman, MD;  Location: [place];  Service: Gastroenterology;  Laterality: N/A;   LEFT HEART CATH AND CORONARY ANGIOGRAPHY N/A 04/27/2019   Procedure: LEFT HEART CATH AND CORONARY ANGIOGRAPHY;  Surgeon: Wellington Hampshire, MD;  Location: [place] CV LAB;  Service: Cardiovascular;  Laterality: N/A;   PROSTATE BIOPSY  02-21-2018  dr Karsten Ro in office   RADIOACTIVE SEED IMPLANT N/A 07/14/2018   Procedure: RADIOACTIVE SEED IMPLANT/BRACHYTHERAPY IMPLANT;  Surgeon: Kathie Rhodes, MD;  Location: [place];  Service: Urology;  Laterality: N/A;  76 seeds    SHOULDER ARTHROSCOPY Left 2018   in [place]   "remove  spur"   SPACE OAR INSTILLATION N/A 07/14/2018   Procedure: SPACE OAR INSTILLATION;  Surgeon: Kathie Rhodes, MD;  [facility];  Service: Urology;  Laterality: N/A;   TEE WITHOUT CARDIOVERSION N/A 04/30/2019   Procedure: TRANSESOPHAGEAL ECHOCARDIOGRAM (TEE);  Surgeon: Grace Isaac, MD;  Location: [place];  Service: Open Heart Surgery;  Laterality: N/A;   Active Ambulatory Problems    Diagnosis Date Noted   Benign essential hypertension 12/02/2015   Chronic painful diabetic neuropathy ([place]) 03/13/2015   DM (diabetes mellitus) type II controlled with renal manifestation (HCC) 08/08/2013   GERD (gastroesophageal reflux disease) 04/07/2018   Hx of colonic polyps 04/07/2018   Hyperlipidemia, mixed 05/02/2015   Malignant neoplasm of prostate ([place]) 04/09/2018   Unstable angina ([place]) 04/27/2019   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   Coronary artery disease 04/30/2019   Iron deficiency anemia    Spinal stenosis, lumbar region, with neurogenic claudication 03/03/2021   Chronic radicular lumbar pain 03/03/2021   Lumbar degenerative disc disease 03/03/2021   Lumbar facet arthropathy 03/03/2021   Chronic pain syndrome 03/03/2021   Resolved Ambulatory Problems    Diagnosis Date Noted   Microcytic anemia    Past Medical History:  Diagnosis Date   Agent orange exposure    Diabetic peripheral neuropathy (HCC)    Diastolic dysfunction    Hyperlipidemia    Hyperplasia of prostate with lower urinary tract symptoms (LUTS)    Hypertension    Prostate cancer [facility]) urologist-- dr ottelin/  oncologist-- dr Tammi Klippel   Type 2 diabetes mellitus treated with insulin ([place])    Wears dentures    Wears glasses    Constitutional Exam  General appearance: Well nourished, well developed, and well hydrated. In no apparent acute distress Vitals:   03/03/21 1015  BP: (!) 73/51  Pulse: 86  Resp: 16  Temp: (!) 97.2 F (36.2 C)  TempSrc: Temporal  SpO2: 98%  Weight: 212 lb (96.2 kg)  Height:  5' 9" (1.753 m)   BMI Assessment: Estimated body mass index is 31.31 kg/m as calculated from the following:   Height as of this encounter: 5' 9" (1.753 m).   Weight as of this encounter: 212 lb (96.2 kg).  BMI interpretation table: BMI level Category Range association with higher incidence of chronic pain  <18 kg/m2 Underweight   18.5-24.9 kg/m2 Ideal body weight   25-29.9 kg/m2 Overweight Increased incidence by 20%  30-34.9 kg/m2 Obese (Class I) Increased incidence by 68%  35-39.9 kg/m2 Severe obesity (  Class II) Increased incidence by 136%  >40 kg/m2 Extreme obesity (Class III) Increased incidence by 254%   Patient's current BMI Ideal Body weight  Body mass index is 31.31 kg/m. Ideal body weight: 70.7 kg (155 lb 13.8 oz) Adjusted ideal body weight: 80.9 kg (178 lb 5.1 oz)   BMI Readings from Last 4 Encounters:  03/03/21 31.31 kg/m  03/06/20 30.06 kg/m  10/26/19 30.20 kg/m  10/23/19 29.78 kg/m   Wt Readings from Last 4 Encounters:  03/03/21 212 lb (96.2 kg)  03/06/20 215 lb 8 oz (97.8 kg)  10/26/19 216 lb 8 oz (98.2 kg)  10/23/19 213 lb 8 oz (96.8 kg)    Psych/Mental status: Alert, oriented x 3 (person, place, & time)       Eyes: PERLA Respiratory: No evidence of acute respiratory distress  Thoracic Spine Area Exam  Skin & Axial Inspection: No masses, redness, or swelling Alignment: Symmetrical Functional ROM: Unrestricted ROM Stability: No instability detected Muscle Tone/Strength: Functionally intact. No obvious neuro-muscular anomalies detected. Sensory (Neurological): Unimpaired Muscle strength & Tone: No palpable anomalies Lumbar Spine Area Exam  Skin & Axial Inspection: No masses, redness, or swelling Alignment: Symmetrical Functional ROM: Pain restricted ROM affecting both sides Stability: No instability detected Muscle Tone/Strength: Functionally intact. No obvious neuro-muscular anomalies detected. Sensory (Neurological): Neurogenic pain  pattern  Gait & Posture Assessment  Ambulation: Patient came in today in a wheel chair Gait: Significantly limited. Dependent on assistive device to ambulate Posture: Difficulty standing up straight, due to pain  Lower Extremity Exam    Side: Right lower extremity  Side: Left lower extremity  Stability: No instability observed          Stability: No instability observed          Skin & Extremity Inspection: Skin color, temperature, and hair growth are WNL. No peripheral edema or cyanosis. No masses, redness, swelling, asymmetry, or associated skin lesions. No contractures.  Skin & Extremity Inspection: Skin color, temperature, and hair growth are WNL. No peripheral edema or cyanosis. No masses, redness, swelling, asymmetry, or associated skin lesions. No contractures.  Functional ROM: Pain restricted ROM for hip joint          Functional ROM: Pain restricted ROM for hip joint          Muscle Tone/Strength: Functionally intact. No obvious neuro-muscular anomalies detected.  Muscle Tone/Strength: Functionally intact. No obvious neuro-muscular anomalies detected.  Sensory (Neurological): Neurogenic pain pattern        Sensory (Neurological): Neurogenic pain pattern        DTR: Patellar: deferred today Achilles: deferred today Plantar: deferred today  DTR: Patellar: deferred today Achilles: deferred today Plantar: deferred today  Palpation: No palpable anomalies  Palpation: No palpable anomalies    Assessment  Primary Diagnosis & Pertinent Problem List: The primary encounter diagnosis was Spinal stenosis, lumbar region, with neurogenic claudication. Diagnoses of Chronic radicular lumbar pain, Lumbar degenerative disc disease, Lumbar facet arthropathy, and Chronic pain syndrome were also pertinent to this visit.  Visit Diagnosis (New problems to examiner): 1. Spinal stenosis, lumbar region, with neurogenic claudication   2. Chronic radicular lumbar pain   3. Lumbar degenerative disc  disease   4. Lumbar facet arthropathy   5. Chronic pain syndrome    Plan of Care (Initial workup plan)  Mr. Bonenberger is a pleasant 76 year old male with a history of severe multilevel lumbar spinal stenosis and associated neurogenic claudication that is resulting in low back and associated leg pain and weakness  that is resulting in falls and difficulty ambulating.  He has been evaluated by [place] [facility] and received 1 lumbar epidural steroid injection there which was not helpful.  He does have an impressive cardiac history and is a type II diabetic.  Patient is already on gabapentin and Cymbalta.  I will refer the patient to neurosurgery to see if surgical intervention is indicated.  If neurosurgery recommends any additional diagnostic injections, I am happy to facilitate.  I did offer the patient Qutenza, capsaicin topical therapy for bilateral foot paresthesias related to diabetic neuropathy.   Orders Placed This Encounter  Procedures   Ambulatory referral to Neurosurgery    Referral Priority:   Routine    Referral Reason:   Specialty Services Required    Referred to Provider:   Meade Maw, MD    Requested Specialty:   Neurosurgery    Number of Visits Requested:   1     Provider-requested follow-up: Return in about 6 weeks (around 04/14/2021) for follow up. I spent a total of 60 minutes reviewing chart data, face-to-face evaluation with the patient, counseling and coordination of care as detailed above.   Future Appointments  Date Time Provider [place]  04/09/2021  1:00 PM Gillis Santa, MD [facility] None    Note by: Gillis Santa, MD Date: 03/03/2021; Time: 11:00 AM

## 2021-04-09 ENCOUNTER — Encounter: Payer: Self-pay | Admitting: Student in an Organized Health Care Education/Training Program

## 2021-04-09 ENCOUNTER — Ambulatory Visit
Payer: No Typology Code available for payment source | Attending: Student in an Organized Health Care Education/Training Program | Admitting: Student in an Organized Health Care Education/Training Program

## 2021-04-09 ENCOUNTER — Other Ambulatory Visit: Payer: Self-pay

## 2021-04-09 VITALS — BP 150/89 | HR 98 | Temp 97.9°F | Ht 69.0 in | Wt 212.0 lb

## 2021-04-09 DIAGNOSIS — M48062 Spinal stenosis, lumbar region with neurogenic claudication: Secondary | ICD-10-CM | POA: Insufficient documentation

## 2021-04-09 DIAGNOSIS — R202 Paresthesia of skin: Secondary | ICD-10-CM | POA: Diagnosis not present

## 2021-04-09 DIAGNOSIS — G894 Chronic pain syndrome: Secondary | ICD-10-CM | POA: Insufficient documentation

## 2021-04-09 DIAGNOSIS — E1140 Type 2 diabetes mellitus with diabetic neuropathy, unspecified: Secondary | ICD-10-CM | POA: Insufficient documentation

## 2021-04-09 DIAGNOSIS — M5136 Other intervertebral disc degeneration, lumbar region: Secondary | ICD-10-CM

## 2021-04-09 DIAGNOSIS — M47816 Spondylosis without myelopathy or radiculopathy, lumbar region: Secondary | ICD-10-CM | POA: Insufficient documentation

## 2021-04-09 NOTE — Progress Notes (Signed)
Safety precautions to be maintained throughout the outpatient stay will include: orient to surroundings, keep bed in low position, maintain call bell within reach at all times, provide assistance with transfer out of bed and ambulation.  

## 2021-04-09 NOTE — Progress Notes (Signed)
PROVIDER NOTE: Information contained herein reflects review and annotations entered in association with encounter. Interpretation of such information and data should be left to medically-trained personnel. Information provided to patient can be located elsewhere in the medical record under "Patient Instructions". Document created using STT-dictation technology, any transcriptional errors that may result from process are unintentional.    Patient: Cristian Martin  Service Category: E/M  Provider: Gillis Santa, MD  DOB: July 23, 1945  DOS: 04/09/2021  Specialty: Interventional Pain Management  MRN: [id number]  Setting: Ambulatory outpatient  [facility]: [facility]  Type: Established Patient    Referring Provider: [facility]  Location: Office  Delivery: Face-to-face     HPI  [name], a 76 y.o. year old male, is here today because of his Chronic painful diabetic neuropathy ([place]) [E11.40]. [name] primary complain today is Back Pain (lower) Last encounter: My last encounter with him was on 03/03/2021. Pertinent problems: [name] has Chronic painful diabetic neuropathy ([place]); DM (diabetes mellitus) type II controlled with renal manifestation ([place]); S/P CABG x 3; Spinal stenosis, lumbar region, with neurogenic claudication; Chronic radicular lumbar pain; Lumbar degenerative disc disease; Lumbar facet arthropathy; Chronic pain syndrome; and Paresthesia of both feet on their pertinent problem list. Pain Assessment: Severity of Chronic pain is reported as a 9 /10. Location: Back Mid, Lower/legs become weak. Onset: More than a month ago. Quality:  . Timing: Constant. Modifying factor(s): nothing. Vitals:  height is _0  (1.753 m) and weight is 212 lb (96.2 kg). His temperature is 97.9 F (36.6 C). His blood pressure is 150/89 (abnormal) and his pulse is 98. His oxygen saturation is 95%.   Reason for encounter: follow-up evaluation.  This is [name] second visit with me.  Unfortunately [facility] Dr. Cari Caraway with [facility].  They are stating that his referral to [facility] expires in May at which point we can do a new referral to Dr. Cari Caraway.  Patient does not want to follow back up with [facility] as he did not have a good experience with him previously.  He had a lumbar epidural steroid injection there which was not helpful but states that for communication and care even prior to that was suboptimal hence him coming here.  Patient unfortunately has severe multilevel lumbar spinal stenosis with neurogenic claudication.  He is on multiple analgesic medications.  He has tried lumbar epidural steroid injection which was not helpful.  He is a type II diabetic on insulin.  At this point, I have limited options for his low back leg pain/weakness related to lumbar spinal stenosis and neurogenic claudication.  I will reinitiate referral to Dr. Cari Caraway in May.  In the meantime, I did offer the patient Qutenza capsaicin treatment for neuropathic pain of bilateral feet related to diabetes   See HPI from initial clinic visit below: 76 year old man being referred for chronic back and bilateral leg pain/weakness (primarily left leg pain). Symptoms started without any identifiable inciting event several months back. He does complain of pain as well as some paresthesias in the left leg, significantly exacerbated whenever he stands or walks for any length of time. His left leg symptoms are somewhat improved when he is sitting. He does say that when he stands up and punches forward, he does get some relief in his leg symptoms. He does also have a more constant pressure-type pain in his lower back which is again worsened when he stands or walks. He  does have a history of diabetes and has some neuropathy in both his feet. He has been taking some gabapentin which has not provided any real improvement in his back or leg pain although it does help somewhat  with his diabetic neuropathy.Of note, the patient does have a history of coronary artery bypass surgery a year to ago. He is currently on baby aspirin. He also has a history of hypertension and diabetes. He has a history of prostate cancer. No history of lung, liver, or kidney disease.  Not on chronic opioid therapy. Status post lumbar epidural steroid injection on 02/10/2021.  This was done at [facility].  Neither him nor his wife were pleased with their encounter there.  He is experiencing more falls due to weakness of his legs.    ROS  Constitutional: Denies any fever or chills Gastrointestinal: No reported hemesis, hematochezia, vomiting, or acute GI distress Musculoskeletal:  Low back pain Neurological:  Weakness of bilateral lower extremities, paresthesias of bilateral feet  Medication Review  DULoxetine, Fusion Plus, Multiple Vitamin, Semaglutide, acetaminophen, amLODipine, aspirin EC, calcium carbonate, carvedilol, cloNIDine, diclofenac Sodium, doxazosin, escitalopram, famotidine, gabapentin, glimepiride, guaiFENesin, insulin aspart protamine - aspart, losartan, metFORMIN, metoprolol tartrate, nitroGLYCERIN, omeprazole, rosuvastatin, silodosin, and simvastatin  History Review  Allergy: Mr. Spellman is allergic to ace inhibitors. Drug: Mr. Seals  reports no history of drug use. Alcohol:  reports that he does not currently use alcohol. Tobacco:  reports that he has never smoked. He has never used smokeless tobacco. Social: Mr. Mccord  reports that he has never smoked. He has never used smokeless tobacco. He reports that he does not currently use alcohol. He reports that he does not use drugs. Medical:  has a past medical history of Agent orange exposure, Coronary artery disease, Diabetic peripheral neuropathy ([place]), Diastolic dysfunction, GERD (gastroesophageal reflux disease), Hyperlipidemia, Hyperplasia of prostate with lower urinary tract symptoms (LUTS), Hypertension,  Prostate cancer [facility]) (urologist-- dr ottelin/  oncologist-- dr Tammi Klippel), Type 2 diabetes mellitus treated with insulin ([place]), Wears dentures, and Wears glasses. Surgical: Mr. Tanzi  has a past surgical history that includes Prostate biopsy (02-21-2018  dr Karsten Ro in office); Shoulder arthroscopy (Left, 2018   in [place]); Radioactive seed implant (N/A, 07/14/2018); SPACE OAR INSTILLATION (N/A, 07/14/2018); Cystoscopy (N/A, 07/14/2018); LEFT HEART CATH AND CORONARY ANGIOGRAPHY (N/A, 04/27/2019); Coronary artery bypass graft (N/A, 04/30/2019); TEE without cardioversion (N/A, 04/30/2019); Colonoscopy with propofol (N/A, 08/16/2019); and Esophagogastroduodenoscopy (egd) with propofol (N/A, 08/16/2019). Family: family history includes Diabetes in his father and sister; Hypertension in his father; Stroke in his mother.  Laboratory Chemistry Profile   Renal Lab Results  Component Value Date   BUN 22 05/23/2019   CREATININE 0.85 05/23/2019   BCR 26 (H) 05/23/2019   GFRAA 99 05/23/2019   GFRNONAA 86 05/23/2019    Hepatic Lab Results  Component Value Date   AST 27 04/28/2019   ALT 33 04/28/2019   ALBUMIN 3.7 04/28/2019   ALKPHOS 61 04/28/2019    Electrolytes Lab Results  Component Value Date   NA 140 05/23/2019   K 4.9 05/23/2019   CL 101 05/23/2019   CALCIUM 9.2 05/23/2019   MG 2.3 05/01/2019    Bone No results found for: VD25OH, VD125OH2TOT, DE0814GY1, EH6314HF0, 25OHVITD1, 25OHVITD2, 25OHVITD3, TESTOFREE, TESTOSTERONE  Inflammation (CRP: Acute Phase) (ESR: Chronic Phase) No results found for: CRP, ESRSEDRATE, LATICACIDVEN       Note: Above Lab results reviewed.  Recent Imaging Review  MR LUMBAR SPINE WO CONTRAST CLINICAL DATA:  Low back, left calf and bilateral buttock soreness with numbness in feet.  EXAM: MRI LUMBAR SPINE WITHOUT CONTRAST  TECHNIQUE: Multiplanar, multisequence MR imaging of the lumbar spine was performed. No intravenous contrast was administered.  COMPARISON:   None.  FINDINGS: Segmentation:  Standard.  Alignment:  Physiologic.  Vertebrae: No acute fracture, evidence of discitis, or aggressive bone lesion.  Conus medullaris and cauda equina: Conus extends to the L1 level. Conus and cauda equina appear normal.  Paraspinal and other soft tissues: No acute paraspinal abnormality.  Disc levels:  Disc spaces: Degenerative disease with disc height loss at L2-3, L3-4, L4-5 and L5-S1. Reactive endplate edema at N0-2.  T12-L1: No significant disc bulge. No neural foraminal stenosis. No central canal stenosis.  L1-L2: Minimal broad-based disc bulge. No foraminal or central canal stenosis. Mild spinal stenosis.  L2-L3: Broad-based disc bulge flattening the ventral thecal sac. Mild bilateral facet arthropathy. Mild spinal stenosis. No foraminal or central canal stenosis.  L3-L4: Broad-based disc bulge. Mild bilateral facet arthropathy. Mild spinal stenosis. Mild left foraminal stenosis. No right foraminal stenosis.  L4-L5: Broad-based disc bulge with a broad central disc protrusion. Mild bilateral facet arthropathy with ligamentum flavum infolding. Severe spinal stenosis. Severe bilateral foraminal stenosis.  L5-S1: Broad-based disc bulge. Moderate right and mild left facet arthropathy. Moderate bilateral foraminal stenosis. No central canal stenosis.  IMPRESSION: 1. Lumbar spine spondylosis as described above most severe at L4-5. 2. At L4-5 there is a broad-based disc bulge with a broad central disc protrusion. Mild bilateral facet arthropathy with ligamentum flavum infolding. Severe spinal stenosis. Severe bilateral foraminal stenosis. 3. No acute osseous injury of the lumbar spine.  Electronically Signed   By: Kathreen Devoid M.D.   On: 12/17/2020 10:08 Note: Reviewed        Physical Exam  General appearance: Well nourished, well developed, and well hydrated. In no apparent acute distress Mental status: Alert, oriented x 3  (person, place, & time)       Respiratory: No evidence of acute respiratory distress Eyes: PERLA Vitals: BP (!) 150/89    Pulse 98    Temp 97.9 F (36.6 C)    Ht _0  (1.753 m)    Wt 212 lb (96.2 kg)    SpO2 95%    BMI 31.31 kg/m  BMI: Estimated body mass index is 31.31 kg/m as calculated from the following:   Height as of this encounter: _1  (1.753 m).   Weight as of this encounter: 212 lb (96.2 kg). Ideal: Ideal body weight: 70.7 kg (155 lb 13.8 oz) Adjusted ideal body weight: 80.9 kg (178 lb 5.1 oz)  Lumbar Spine Area Exam  Skin & Axial Inspection: No masses, redness, or swelling Alignment: Symmetrical Functional ROM: Pain restricted ROM affecting both sides Stability: No instability detected Muscle Tone/Strength: Functionally intact. No obvious neuro-muscular anomalies detected. Sensory (Neurological): Neurogenic pain pattern   Gait & Posture Assessment  Ambulation: Patient came in today in a wheel chair Gait: Significantly limited. Dependent on assistive device to ambulate Posture: Difficulty standing up straight, due to pain  Lower Extremity Exam      Side: Right lower extremity   Side: Left lower extremity  Stability: No instability observed           Stability: No instability observed          Skin & Extremity Inspection: Skin color, temperature, and hair growth are WNL. No peripheral edema or cyanosis. No masses, redness, swelling, asymmetry, or associated skin lesions. No  contractures.   Skin & Extremity Inspection: Skin color, temperature, and hair growth are WNL. No peripheral edema or cyanosis. No masses, redness, swelling, asymmetry, or associated skin lesions. No contractures.  Functional ROM: Pain restricted ROM for hip joint           Functional ROM: Pain restricted ROM for hip joint          Muscle Tone/Strength: Functionally intact. No obvious neuro-muscular anomalies detected.   Muscle Tone/Strength: Functionally intact. No obvious neuro-muscular anomalies  detected.  Sensory (Neurological): Neurogenic pain pattern         Sensory (Neurological): Neurogenic pain pattern        DTR: Patellar: deferred today Achilles: deferred today Plantar: deferred today   DTR: Patellar: deferred today Achilles: deferred today Plantar: deferred today  Palpation: No palpable anomalies   Palpation: No palpable anomalies     Assessment   Status Diagnosis  Having a Flare-up Having a Flare-up Controlled 1. Chronic painful diabetic neuropathy (HCC)   2. Paresthesia of both feet   3. Spinal stenosis, lumbar region, with neurogenic claudication   4. Lumbar facet arthropathy   5. Chronic pain syndrome   6. Lumbar degenerative disc disease      Updated Problems: Problem  Paresthesia of Both Feet  Spinal Stenosis, Lumbar Region, With Neurogenic Claudication  Chronic Radicular Lumbar Pain  Lumbar Degenerative Disc Disease  Lumbar Facet Arthropathy  Chronic Pain Syndrome  S/P Cabg X 3   DATE OF PROCEDURE:  04/30/2019 PREOPERATIVE DIAGNOSIS:  Unstable angina with severe 3-vessel coronary artery disease. POSTOPERATIVE DIAGNOSES:  Unstable angina with severe 3-vessel coronary artery disease. SURGICAL PROCEDURE:   1.  Coronary artery bypass grafting x3 with the left internal mammary to the left anterior descending coronary artery, reverse saphenous vein graft to the obtuse marginal coronary artery, reverse saphenous vein graft to the right coronary artery with  right greater saphenous thigh and calf endoscopic vein harvesting. 2.  Biopsy of mediastinal lymph node.   Chronic Painful Diabetic Neuropathy (Hcc)  DM (Diabetes Mellitus) Type II Controlled With Renal Manifestation (Hcc)    Plan of Care  Qutenza neurolysis for painful diabetic neuropathy of bilateral feet  Orders:  Orders Placed This Encounter  Procedures   NEUROLYSIS    Please order Qutenza patches from pharmacy    Standing Status:   Future    Standing Expiration Date:   07/10/2021     Order Specific Question:   Where will this procedure be performed?    Answer:   [facility]   Follow-up plan:   Return in about 2 weeks (around 04/23/2021) for Qutenza Rx for PDN.    Recent Visits Date Type Provider Dept  03/03/21 Office Visit Gillis Santa, MD [facility]  Showing recent visits within past 90 days and meeting all other requirements Today's Visits Date Type Provider Dept  04/09/21 Office Visit Gillis Santa, MD [facility]  Showing today's visits and meeting all other requirements Future Appointments No visits were found meeting these conditions. Showing future appointments within next 90 days and meeting all other requirements  I discussed the assessment and treatment plan with the patient. The patient was provided an opportunity to ask questions and all were answered. The patient agreed with the plan and demonstrated an understanding of the instructions.  Patient advised to call back or seek an in-person evaluation if the symptoms or condition worsens.  Duration of encounter: 69mnutes.  Note by: BGillis Santa MD Date: 04/09/2021; Time: 1:54  PM

## 2021-05-11 ENCOUNTER — Encounter: Payer: Self-pay | Admitting: Student in an Organized Health Care Education/Training Program

## 2021-05-11 ENCOUNTER — Ambulatory Visit
Payer: No Typology Code available for payment source | Attending: Student in an Organized Health Care Education/Training Program | Admitting: Student in an Organized Health Care Education/Training Program

## 2021-05-11 VITALS — BP 131/74 | HR 89 | Temp 97.1°F | Resp 16 | Ht 69.0 in | Wt 197.0 lb

## 2021-05-11 DIAGNOSIS — M48062 Spinal stenosis, lumbar region with neurogenic claudication: Secondary | ICD-10-CM | POA: Diagnosis present

## 2021-05-11 DIAGNOSIS — G894 Chronic pain syndrome: Secondary | ICD-10-CM | POA: Insufficient documentation

## 2021-05-11 DIAGNOSIS — R202 Paresthesia of skin: Secondary | ICD-10-CM | POA: Insufficient documentation

## 2021-05-11 DIAGNOSIS — E1140 Type 2 diabetes mellitus with diabetic neuropathy, unspecified: Secondary | ICD-10-CM | POA: Insufficient documentation

## 2021-05-11 MED ORDER — CAPSAICIN-CLEANSING GEL 8 % EX KIT
4.0000 | PACK | Freq: Once | CUTANEOUS | Status: AC
  Administered 2021-05-11: 4 via TOPICAL
  Filled 2021-05-11: qty 4

## 2021-05-11 NOTE — Progress Notes (Signed)
PROVIDER NOTE: Interpretation of information contained herein should be left to medically-trained personnel. Specific patient instructions are provided elsewhere under "Patient Instructions" section of medical record. This document was created in part using STT-dictation technology, any transcriptional errors that may result from this process are unintentional.  ?Patient: Cristian Martin ?Type: Established ?DOB: September 03, 1945 ?MRN: [id number] ?[facility]  Service: Procedure ?DOS: 05/11/2021 ?Setting: Ambulatory ?Location: Ambulatory outpatient facility ?Delivery: Face-to-face Provider: Gillis Santa, MD ?Specialty: Interventional Pain Management ?Specialty designation: 09 ?Location: Outpatient facility ?Ref. Prov.: [facility]   ? ?Primary Reason for Visit: Interventional Pain Management Treatment. ?CC: Foot Pain (bil[place]) ? ?  ?Procedure:           ?Qutenza for PDN   ? ?1. Chronic painful diabetic neuropathy (Cristian Martin)   ?2. Paresthesia of both feet   ?3. Spinal stenosis, lumbar region, with neurogenic claudication   ?4. Chronic pain syndrome   ? ?NAS-11 Pain score:  ? Pre-procedure: 6 /10  ? Post-procedure: 6 /10  ? ?  ?Pre-op H&P Assessment:  ?Cristian Martin is a 76 y.o. (year old), male patient, seen today for interventional treatment. He  has a past surgical history that includes Prostate biopsy (02-21-2018  dr Karsten Ro in office); Shoulder arthroscopy (Left, 2018   in [place]); Radioactive seed implant (N/A, 07/14/2018); SPACE OAR INSTILLATION (N/A, 07/14/2018); Cystoscopy (N/A, 07/14/2018); LEFT HEART CATH AND CORONARY ANGIOGRAPHY (N/A, 04/27/2019); Coronary artery bypass graft (N/A, 04/30/2019); TEE without cardioversion (N/A, 04/30/2019); Colonoscopy with propofol (N/A, 08/16/2019); and Esophagogastroduodenoscopy (egd) with propofol (N/A, 08/16/2019). Cristian Martin has a current medication list which includes the following prescription(s): acetaminophen, aspirin ec, calcium carbonate, carvedilol, clonidine, diclofenac  sodium, doxazosin, duloxetine, escitalopram, famotidine, gabapentin, glimepiride, guaifenesin, novolog mix 70/30 flexpen, fusion plus, losartan, metformin, metoprolol tartrate, multiple vitamin, nitroglycerin, omeprazole, rosuvastatin, semaglutide, silodosin, simvastatin, and amlodipine. His primarily concern today is the Foot Pain ([place]) ? ?Initial Vital Signs:  ?Pulse/HCG Rate: 89  ?Temp: (!) 97.1 ?F (36.2 ?C) ?Resp: 16 ?BP: 131/74 ?SpO2: 99 % ? ?BMI: Estimated body mass index is 29.09 kg/m? as calculated from the following: ?  Height as of this encounter: '5\' 9"'$  (1.753 m). ?  Weight as of this encounter: 197 lb (89.4 kg). ? ?Risk Assessment: ?Allergies: Reviewed. He is allergic to ace inhibitors.  ?Allergy Precautions: None required ?Coagulopathies: Reviewed. None identified.  ?Blood-thinner therapy: None at this time ?Active Infection(s): Reviewed. None identified. Cristian Martin is afebrile ? ?Site Confirmation: Mr. Kohan was asked to confirm the procedure and laterality before marking the site ?Procedure checklist: Completed ?Consent: Before the procedure and under the influence of no sedative(s), amnesic(s), or anxiolytics, the patient was informed of the treatment options, risks and possible complications. To fulfill our ethical and legal obligations, as recommended by the American Medical Association's Code of Ethics, I have informed the patient of my clinical impression; the nature and purpose of the treatment or procedure; the risks, benefits, and possible complications of the intervention; the alternatives, including doing nothing; the risk(s) and benefit(s) of the alternative treatment(s) or procedure(s); and the risk(s) and benefit(s) of doing nothing. ?The patient was provided information about the general risks and possible complications associated with the procedure. These may include, but are not limited to: failure to achieve desired goals, infection, bleeding, organ or nerve damage, allergic  reactions, paralysis, and death. ?In addition, the patient was informed of those risks and complications associated to the procedure, such as failure to decrease pain; infection; bleeding; organ or nerve damage with subsequent  damage to sensory, motor, and/or autonomic systems, resulting in permanent pain, numbness, and/or weakness of one or several areas of the body; allergic reactions; (i.e.: anaphylactic reaction); and/or death. ?Furthermore, the patient was informed of those risks and complications associated with the medications. These include, but are not limited to: allergic reactions (i.e.: anaphylactic or anaphylactoid reaction(s)); adrenal axis suppression; blood sugar elevation that in diabetics may result in ketoacidosis or comma; water retention that in patients with history of congestive heart failure may result in shortness of breath, pulmonary edema, and decompensation with resultant heart failure; weight gain; swelling or edema; medication-induced neural toxicity; particulate matter embolism and blood vessel occlusion with resultant organ, and/or nervous system infarction; and/or aseptic necrosis of one or more joints. ?Finally, the patient was informed that Medicine is not an exact science; therefore, there is also the possibility of unforeseen or unpredictable risks and/or possible complications that may result in a catastrophic outcome. The patient indicated having understood very clearly. We have given the patient no guarantees and we have made no promises. Enough time was given to the patient to ask questions, all of which were answered to the patient's satisfaction. Cristian Martin has indicated that he wanted to continue with the procedure. ?Attestation: I, the ordering provider, attest that I have discussed with the patient the benefits, risks, side-effects, alternatives, likelihood of achieving goals, and potential problems during recovery for the procedure that I have provided informed  consent. ?Date  Time: 05/11/2021 10:16 AM ? ?Pre-Procedure Preparation:  ?Monitoring: As per clinic protocol. Respiration, ETCO2, SpO2, BP, heart rate and rhythm monitor placed and checked for adequate function ?Safety Precautions: Patient was assessed for positional comfort and pressure points before starting the procedure. ?Time-out: I initiated and conducted the "Time-out" before starting the procedure, as per protocol. The patient was asked to participate by confirming the accuracy of the "Time Out" information. Verification of the correct person, site, and procedure were performed and confirmed by me, the nursing staff, and the patient. "Time-out" conducted as per Joint Commission's Universal Protocol (UP.01.01.01). ?Time:   ? ?Description of Procedure:          ? ?Area Prepped: Entire foot Region ?DuraPrep (Iodine Povacrylex [0.7% available iodine] and Isopropyl Alcohol, 74% w/w) ? ? ?Patient has a history of diabetic peripheral neuropathy.  We discussed Qutenza capsaicin topical therapy for his condition.  The patient does have multiple comorbidities.  We discussed the benefits of topical Qutenza which includes not systemic absorption minimizing drug drug interactions, sustained pain relief and nonopioid.  Risk and benefits reviewed and patient would like to proceed.  ? ? ?1 qutenza film applied to the plantar surface of each foot and 1 qutenza film applied to the dorsal surface of each foot.  Coban wrapped around each patch. ? ? ?    ?      ? ?     ? ? ? ? ? ?Vitals:  ? 05/11/21 1021  ?BP: 131/74  ?Pulse: 89  ?Resp: 16  ?Temp: (!) 97.1 ?F (36.2 ?C)  ?TempSrc: Temporal  ?SpO2: 99%  ?Weight: 197 lb (89.4 kg)  ?Height: '5\' 9"'$  (1.753 m)  ? ? ?Post-operative Assessment:  ?Post-procedure Vital Signs:  ?Pulse/HCG Rate: 89  ?Temp:  ?(!) 97.1 ?F (36.2 ?C) ?Resp: 16 ?BP: 131/74 ?SpO2: 99 % ? ?EBL: None ? ?Complications: No immediate post-treatment complications observed by team, or reported by patient. ? ?Note: The  patient tolerated the entire procedure well. A repeat set of vitals were taken after  the procedure and the patient was kept under observation following institutional policy, for this type of procedure. Post-procedural neu

## 2021-06-02 ENCOUNTER — Emergency Department
Admission: EM | Admit: 2021-06-02 | Discharge: 2021-06-02 | Disposition: A | Discharge status: Home / Self Care | Payer: No Typology Code available for payment source | Attending: Emergency Medicine | Admitting: Emergency Medicine

## 2021-06-02 ENCOUNTER — Other Ambulatory Visit: Payer: Self-pay

## 2021-06-02 ENCOUNTER — Emergency Department: Payer: No Typology Code available for payment source

## 2021-06-02 DIAGNOSIS — S0101XA Laceration without foreign body of scalp, initial encounter: Secondary | ICD-10-CM | POA: Diagnosis not present

## 2021-06-02 DIAGNOSIS — S0990XA Unspecified injury of head, initial encounter: Secondary | ICD-10-CM | POA: Diagnosis present

## 2021-06-02 DIAGNOSIS — W19XXXA Unspecified fall, initial encounter: Secondary | ICD-10-CM | POA: Insufficient documentation

## 2021-06-02 DIAGNOSIS — Z23 Encounter for immunization: Secondary | ICD-10-CM | POA: Insufficient documentation

## 2021-06-02 DIAGNOSIS — E119 Type 2 diabetes mellitus without complications: Secondary | ICD-10-CM | POA: Insufficient documentation

## 2021-06-02 LAB — CBG MONITORING, ED
Glucose-Capillary: 42 mg/dL — CL (ref 70–99)
Glucose-Capillary: 91 mg/dL (ref 70–99)

## 2021-06-02 MED ORDER — TETANUS-DIPHTH-ACELL PERTUSSIS 5-2.5-18.5 LF-MCG/0.5 IM SUSY
0.5000 mL | PREFILLED_SYRINGE | Freq: Once | INTRAMUSCULAR | Status: AC
  Administered 2021-06-02: 0.5 mL via INTRAMUSCULAR
  Filled 2021-06-02: qty 0.5

## 2021-06-02 NOTE — Discharge Instructions (Addendum)
As we discussed, please follow-up with your PCP, any urgent care or emergency room to have these staples removed after 7-10 days. ? ?Keep the area clean and dry.  You may shower, gently wash the area with soap and water, pat dry.  Apply Neosporin or bacitracin ointment. ? ?Cover if you are outside or wearing a hat. ?

## 2021-06-02 NOTE — ED Provider Notes (Signed)
? ?[facility] ?Provider Note ? ? ? Event Date/Time  ? First MD Initiated Contact with Patient 06/02/21 2254   ?  (approximate) ? ? ?History  ? ?Fall ? ? ?HPI ? ?Cristian Martin is a 76 y.o. male who presents to the ED for evaluation of Fall ?  ?I reviewed neurosurgical evaluation on 3/30, seen for chronic lower back pain.  Not a surgical candidate for L4/5 discectomy due to poor A1c with his history of diabetes. ? ?Patient presents to the ED for evaluation of a mechanical fall that occurred earlier today.  He reports bilateral lower extremity instability due to his chronic neurogenic claudication and lumbar stenosis.  He was unloading groceries from back of the car today when his legs gave out from under him, causing his occiput to strike the bumper of a car.  No syncope.  He has been ambulatory since then.  Reports feeling okay and really does not have much pain, but thought he needed to get checked out because there was some bleeding and he heard older people need CT scans after hitting her head. ? ?Physical Exam  ? ?Triage Vital Signs: ?ED Triage Vitals [06/02/21 1937]  ?Enc Vitals Group  ?   BP (!) 147/82  ?   Pulse Rate 87  ?   Resp 19  ?   Temp 98.1 ?F (36.7 ?C)  ?   Temp src   ?   SpO2 93 %  ?   Weight 197 lb (89.4 kg)  ?   Height '5\' 9"'$  (1.753 m)  ?   Head Circumference   ?   Peak Flow   ?   Pain Score 0  ?   Pain Loc   ?   Pain Edu?   ?   Excl. in [place]?   ? ? ?Most recent vital signs: ?Vitals:  ? 06/02/21 1937 06/02/21 2243  ?BP: (!) 147/82 (!) 161/97  ?Pulse: 87 79  ?Resp: 19 18  ?Temp: 98.1 ?F (36.7 ?C)   ?SpO2: 93% 97%  ? ? ?General: Awake, no distress.  Pleasant and conversational, sitting up in the side of the bed and well-appearing. ?CV:  Good peripheral perfusion.  ?Resp:  Normal effort.  ?Abd:  No distention.  ?MSK:  No deformity noted.  ?Neuro:  No focal deficits appreciated. Cranial nerves II through XII intact ?5/5 strength and sensation in all 4 extremities ?Other:  Has some  dried blood to the occiput, after cleaning has a 1.5 cm vertically oriented laceration into the soft tissue of the scalp.  Hemostatic with direct pressure.  No associated bony step-offs.  No active bleeding. ? ? ?ED Results / Procedures / Treatments  ? ?Labs ?(all labs ordered are listed, but only abnormal results are displayed) ?Labs Reviewed  ?CBG MONITORING, ED - Abnormal; Notable for the following components:  ?    Result Value  ? Glucose-Capillary 42 (*)   ? All other components within normal limits  ?CBG MONITORING, ED  ? ? ?EKG ? ? ?RADIOLOGY ?CT head reviewed by me without evidence of acute intracranial pathology ?CT cervical spine reviewed by me without evidence of fracture or dislocation. ? ?Official radiology report(s): ?CT Head Wo Contrast ? ?Result Date: 06/02/2021 ?CLINICAL DATA:  [place], hit head EXAM: CT HEAD WITHOUT CONTRAST TECHNIQUE: Contiguous axial images were obtained from the base of the skull through the vertex without intravenous contrast. RADIATION DOSE REDUCTION: This exam was performed according to the departmental dose-optimization program which  includes automated exposure control, adjustment of the mA and/or kV according to patient size and/or use of iterative reconstruction technique. COMPARISON:  None Available. FINDINGS: Brain: No acute infarct or hemorrhage. Lateral ventricles and midline structures are unremarkable. No acute extra-axial fluid collections. No mass effect. Vascular: No hyperdense vessel or unexpected calcification. Skull: Normal. Negative for fracture or focal lesion. Sinuses/Orbits: Opacification of the left maxillary sinus, anterior left ethmoid air cells, left frontal sinus. Other: None. IMPRESSION: 1. No acute intracranial process. 2. Opacification of the left frontal, anterior left ethmoid, and left maxillary sinuses. Electronically Signed   By: Randa Ngo M.D.   On: 06/02/2021 20:08  ? ?CT Cervical Spine Wo Contrast ? ?Result Date: 06/02/2021 ?CLINICAL DATA:   [place], hit head EXAM: CT CERVICAL SPINE WITHOUT CONTRAST TECHNIQUE: Multidetector CT imaging of the cervical spine was performed without intravenous contrast. Multiplanar CT image reconstructions were also generated. RADIATION DOSE REDUCTION: This exam was performed according to the departmental dose-optimization program which includes automated exposure control, adjustment of the mA and/or kV according to patient size and/or use of iterative reconstruction technique. COMPARISON:  None Available. FINDINGS: Alignment: Alignment is grossly anatomic. Skull base and vertebrae: No acute fracture. No primary bone lesion or focal pathologic process. Soft tissues and spinal canal: No prevertebral fluid or swelling. No visible canal hematoma. Disc levels: Multilevel spondylosis most pronounced at C4-5 and C6-7. Mild diffuse facet hypertrophy. Symmetrical neural foraminal encroachment at C4-5 and C6-7. Upper chest: Airway is patent.  Lung apices are clear. Other: Reconstructed images demonstrate no additional findings. IMPRESSION: 1. No acute cervical spine fracture. 2. Multilevel spondylosis and facet hypertrophy greatest at C4-5 and C6-7. Electronically Signed   By: Randa Ngo M.D.   On: 06/02/2021 20:12   ? ?PROCEDURES and INTERVENTIONS: ? ?Marland Kitchen.Laceration Repair ? ?Date/Time: 06/02/2021 11:29 PM ?Performed by: Vladimir Crofts, MD ?Authorized by: Vladimir Crofts, MD  ? ?Consent:  ?  Consent obtained:  Verbal ?  Consent given by:  Patient ?  Risks, benefits, and alternatives were discussed: yes   ?Laceration details:  ?  Location:  Scalp ?  Scalp location:  Occipital ?  Length (cm):  2 ?Exploration:  ?  Hemostasis achieved with:  Direct pressure ?  Imaging obtained comment:  CT ?Treatment:  ?  Area cleansed with:  Chlorhexidine ?  Amount of cleaning:  Standard ?  Irrigation solution:  Sterile saline ?Skin repair:  ?  Repair method:  Staples ?  Number of staples:  2 ?Approximation:  ?  Approximation:  Close ?Repair type:  ?  Repair  type:  Simple ?Post-procedure details:  ?  Dressing:  Open (no dressing) ?  Procedure completion:  Tolerated well, no immediate complications ? ?Medications  ?Tdap (BOOSTRIX) injection 0.5 mL (0.5 mLs Intramuscular Given 06/02/21 2318)  ? ? ? ?IMPRESSION / MDM / ASSESSMENT AND PLAN / ED COURSE  ?I reviewed the triage vital signs and the nursing notes. ? ?76 year old male presents to the ED with occipital head injury after a fall that was likely mechanical in nature.  Has a small occipital laceration suitable for bedside repair with 2 staples, as above.  Well-tolerated.  No signs of skull fracture, ICH, CVA or cervical fracture/dislocation.  Wound cleaned and 2 staples administered by me.  Tdap updated.  No barriers to outpatient management.  We will discharge with return precautions.  Discussed wound care and staple removal prior to discharge. ? ?  ? ? ?FINAL CLINICAL IMPRESSION(S) / ED DIAGNOSES  ? ?Final  diagnoses:  ?Fall, initial encounter  ?Injury of head, initial encounter  ?Laceration of scalp, initial encounter  ? ? ? ?Rx / DC Orders  ? ?ED Discharge Orders   ? ? None  ? ?  ? ? ? ?Note:  This document was prepared using Dragon voice recognition software and may include unintentional dictation errors. ?  ?Vladimir Crofts, MD ?06/02/21 2330 ? ?

## 2021-06-02 NOTE — ED Triage Notes (Signed)
Pt arrive with c/o a fall and hitting his head on a truck bumper. Pt denies LOC, headache, or pain in his neck.  ?

## 2021-06-02 NOTE — ED Provider Triage Note (Signed)
Emergency Medicine Provider Triage Evaluation Note ? ?Cristian Martin , a 76 y.o. male  was evaluated in triage.  Pt complains of head injury/fall.  Patient presents to the ED after tripping and falling.  Patient was on a gravel driveway, tripped, fell and hit the posterior aspect of his head.  Patient did not lose consciousness.  He does have a laceration to the occipital skull.  Patient denies any headache, visual changes.  Patient is a diabetic, glucose checked in triage at 42.  We will give patient juice at this time.. ? ?Review of Systems  ?Positive: Fall, hit head ?Negative: Loss of consciousness, headache, visual changes, unilateral weakness ? ?Physical Exam  ?BP (!) 147/82 (BP Location: Right Arm)   Pulse 87   Temp 98.1 ?F (36.7 ?C)   Resp 19   Ht '5\' 9"'$  (1.753 m)   Wt 89.4 kg   SpO2 93%   BMI 29.09 kg/m?  ?Gen:   Awake, no distress   ?Resp:  Normal effort  ?MSK:   Moves extremities without difficulty  ?Other:  Visualization of the scalp reveals a laceration that is well approximated at this time.  Dried blood around area.  Palpation over the laceration itself is tender but no other tenderness to the skull or face.  No battle signs, raccoon eyes, serosanguineous fluid drainage from the ears or nares. ? ?Medical Decision Making  ?Medically screening exam initiated at 7:49 PM.  Appropriate orders placed.  Cristian Martin was informed that the remainder of the evaluation will be completed by another provider, this initial triage assessment does not replace that evaluation, and the importance of remaining in the ED until their evaluation is complete. ? ?Patient fell, hit his head.  No loss of consciousness.  Patient does have a laceration to the occipital scalp.  Tdap ordered.  Imaging ordered of the head and neck.  Patient was feeling weak, states that he has not had dinner and is a diabetic.  Glucose was 42 and patient was given juice.  States that his fall was not due to weakness, was mechanical ?   ?Darletta Moll, PA-C ?06/02/21 1952 ? ?

## 2021-06-02 NOTE — ED Notes (Signed)
Pt wife said that they are both diabetics and that she knows pt (husband) has not had anything to eat in several hours and that he does have falls due to low sugars.  ?Tech checked blood sugar and is 42. RN notified.  ?Roderic Palau PA advised to give some juice to pt.  ?Tech provided pt with 2 apple juices. Wife with a ginger ale.  ?

## 2021-06-04 ENCOUNTER — Encounter: Payer: Self-pay | Admitting: Student in an Organized Health Care Education/Training Program

## 2021-06-08 ENCOUNTER — Ambulatory Visit
Payer: No Typology Code available for payment source | Attending: Student in an Organized Health Care Education/Training Program | Admitting: Student in an Organized Health Care Education/Training Program

## 2021-06-08 DIAGNOSIS — R202 Paresthesia of skin: Secondary | ICD-10-CM | POA: Diagnosis not present

## 2021-06-08 DIAGNOSIS — E1140 Type 2 diabetes mellitus with diabetic neuropathy, unspecified: Secondary | ICD-10-CM

## 2021-06-08 DIAGNOSIS — G894 Chronic pain syndrome: Secondary | ICD-10-CM

## 2021-06-08 NOTE — Assessment & Plan Note (Signed)
Status post Qutenza treatment which the patient is finding benefit from.  He endorses less paresthesias in his feet and more sensation.  He would like to repeat the treatment.  We will do this 3 months from his last treatment. ?

## 2021-06-08 NOTE — Progress Notes (Signed)
Patient: Cristian Martin  Service Category: E/M  Provider: Gillis Santa, MD  ?DOB: 02/27/1945  DOS: 06/08/2021  Location: Office  ?MRN: [id number]  Setting: Ambulatory outpatient  Referring Provider: [place]  ?Type: Established Patient  Specialty: Interventional Pain Management  PCP: Omar  ?Location: Remote location  Delivery: TeleHealth    ? ?Virtual Encounter - Pain Management ?PROVIDER NOTE: Information contained herein reflects review and annotations entered in association with encounter. Interpretation of such information and data should be left to medically-trained personnel. Information provided to patient can be located elsewhere in the medical record under "Patient Instructions". Document created using STT-dictation technology, any transcriptional errors that may result from process are unintentional.  ?  ?Contact & Pharmacy ?Preferred: [phone] ?Home: [phone] (home) ?Mobile: [phone] (mobile) ?E-mail: No e-mail address on record  ?[name] [id number]?Phone: [phone]: [phone]?  ?Pre-screening  ?Mr. Cristian Rampoffered "in-person" vs "virtual" encounter. He indicated preferring virtual for this encounter.  ? ?Reason ?COVID-19*  Social distancing based on CDC and AMA recommendations.  ? ?I contacted Cristian Doughtyon 06/08/2021 via telephone.      I clearly identified myself as BGillis Santa MD. I verified that I was speaking with the correct person using two identifiers (Name: NNICKSON Martin and date of birth: 804-07-1945. ? ?Consent ?I sought verbal advanced consent from Cristian Doughtyfor virtual visit interactions. I informed Mr. JNevaresof possible security and privacy concerns, risks, and limitations associated with providing "not-in-person" medical evaluation and management services. I also informed [name] the availability of "in-person" appointments. Finally, I informed him that there  would be a charge for the virtual visit and that he could be  personally, fully or partially, financially responsible for it. Mr. JTiradoexpressed understanding and agreed to proceed.  ? ?Historic Elements   ?Mr. Cristian HIDROGOis a 76y.o. year old, male patient evaluated today after our last contact on 05/11/2021. Cristian Martin has a past medical history of Agent orange exposure, Coronary artery disease, Diabetic peripheral neuropathy ([place], Diastolic dysfunction, GERD (gastroesophageal reflux disease), Hyperlipidemia, Hyperplasia of prostate with lower urinary tract symptoms (LUTS), Hypertension, Prostate cancer ([facility] (urologist-- dr ottelin/  oncologist-- dr mTammi Klippel, Type 2 diabetes mellitus treated with insulin ([place], Wears dentures, and Wears glasses. He also  has a past surgical history that includes Prostate biopsy (02-21-2018  dr oKarsten Roin office); [place]; Radioactive seed implant (N/A, 07/14/2018); SPACE OAR INSTILLATION (N/A, 07/14/2018); Cystoscopy (N/A, 07/14/2018); LEFT HEART CATH AND CORONARY ANGIOGRAPHY (N/A, 04/27/2019); Coronary artery bypass graft (N/A, 04/30/2019); TEE without cardioversion (N/A, 04/30/2019); Colonoscopy with propofol (N/A, 08/16/2019); and Esophagogastroduodenoscopy (egd) with propofol (N/A, 08/16/2019). Mr. JArwoodhas a current medication list which includes the following prescription(s): acetaminophen, aspirin ec, calcium carbonate, carvedilol, clonidine, diclofenac sodium, doxazosin, duloxetine, escitalopram, famotidine, gabapentin, glimepiride, guaifenesin, novolog mix 70/30 flexpen, fusion plus, losartan, metformin, metoprolol tartrate, multiple vitamin, nitroglycerin, omeprazole, rosuvastatin, semaglutide, silodosin, simvastatin, and amlodipine. He  reports that he has never smoked. He has never used smokeless tobacco. He reports that he does not currently use alcohol. He reports that he does not use drugs. Mr. JKocsisis allergic to ace  inhibitors.  ? ?HPI  ?Today, he is being contacted for a post-procedure assessment. ? ? ?Post-procedure evaluation  ?  ?Procedure:           ?Qutenza for  PDN   ? ?1. Chronic painful diabetic neuropathy ([place])   ?2. Paresthesia of both feet   ?3. Spinal stenosis, lumbar region, with neurogenic claudication   ?4. Chronic pain syndrome   ? ?NAS-11 Pain score:  ? Pre-procedure: 6 /10  ? Post-procedure: 6 /10  ? ?  ?Effectiveness:  ?80 %pain relief ? ? ?Laboratory Chemistry Profile  ? ?Renal ?Lab Results  ?Component Value Date  ? BUN 22 05/23/2019  ? CREATININE 0.85 05/23/2019  ? BCR 26 (H) 05/23/2019  ? GFRAA 99 05/23/2019  ? GFRNONAA 86 05/23/2019  ?  Hepatic ?Lab Results  ?Component Value Date  ? AST 27 04/28/2019  ? ALT 33 04/28/2019  ? ALBUMIN 3.7 04/28/2019  ? ALKPHOS 61 04/28/2019  ?  ?Electrolytes ?Lab Results  ?Component Value Date  ? NA 140 05/23/2019  ? K 4.9 05/23/2019  ? CL 101 05/23/2019  ? CALCIUM 9.2 05/23/2019  ? MG 2.3 05/01/2019  ?  Bone ?No results found for: [place], [id number], [id number], OF7510CH8, 25OHVITD1, 25OHVITD2, 25OHVITD3, TESTOFREE, TESTOSTERONE  ?Inflammation (CRP: Acute Phase) (ESR: Chronic Phase) ?No results found for: CRP, ESRSEDRATE, LATICACIDVEN    ?  ? ?Note: Above Lab results reviewed. ? ?Imaging  ?CT Cervical Spine Wo Contrast ?CLINICAL DATA:  [place], hit head ? ?EXAM: ?CT CERVICAL SPINE WITHOUT CONTRAST ? ?TECHNIQUE: ?Multidetector CT imaging of the cervical spine was performed without ?intravenous contrast. Multiplanar CT image reconstructions were also ?generated. ? ?RADIATION DOSE REDUCTION: This exam was performed according to the ?departmental dose-optimization program which includes automated ?exposure control, adjustment of the mA and/or kV according to ?patient size and/or use of iterative reconstruction technique. ? ?COMPARISON:  None Available. ? ?FINDINGS: ?Alignment: Alignment is grossly anatomic. ? ?Skull base and vertebrae: No acute fracture. No primary bone lesion ?or  focal pathologic process. ? ?Soft tissues and spinal canal: No prevertebral fluid or swelling. No ?visible canal hematoma. ? ?Disc levels: Multilevel spondylosis most pronounced at C4-5 and ?C6-7. Mild diffuse facet hypertrophy. Symmetrical neural foraminal ?encroachment at C4-5 and C6-7. ? ?Upper chest: Airway is patent.  Lung apices are clear. ? ?Other: Reconstructed images demonstrate no additional findings. ? ?IMPRESSION: ?1. No acute cervical spine fracture. ?2. Multilevel spondylosis and facet hypertrophy greatest at C4-5 and ?C6-7. ? ?Electronically Signed ?  By: Randa Ngo M.D. ?  On: 06/02/2021 20:12 ?CT Head Wo Contrast ?CLINICAL DATA:  [place], hit head ? ?EXAM: ?CT HEAD WITHOUT CONTRAST ? ?TECHNIQUE: ?Contiguous axial images were obtained from the base of the skull ?through the vertex without intravenous contrast. ? ?RADIATION DOSE REDUCTION: This exam was performed according to the ?departmental dose-optimization program which includes automated ?exposure control, adjustment of the mA and/or kV according to ?patient size and/or use of iterative reconstruction technique. ? ?COMPARISON:  None Available. ? ?FINDINGS: ?Brain: No acute infarct or hemorrhage. Lateral ventricles and ?midline structures are unremarkable. No acute extra-axial fluid ?collections. No mass effect. ? ?Vascular: No hyperdense vessel or unexpected calcification. ? ?Skull: Normal. Negative for fracture or focal lesion. ? ?Sinuses/Orbits: Opacification of the left maxillary sinus, anterior ?left ethmoid air cells, left frontal sinus. ? ?Other: None. ? ?IMPRESSION: ?1. No acute intracranial process. ?2. Opacification of the left frontal, anterior left ethmoid, and ?left maxillary sinuses. ? ?Electronically Signed ?  By: Randa Ngo M.D. ?  On: 06/02/2021 20:08 ? ?Assessment  ?The primary encounter diagnosis was Chronic painful diabetic neuropathy ([place]). Diagnoses of Paresthesia of both feet and Chronic pain syndrome were also  pertinent to  this visit. ? ?Plan of Care  ?Problem-specific:  ?Chronic painful diabetic neuropathy ([place]) ?Status post Qutenza treatment which the patient is finding benefit from.  He endorses less paresthesias in

## 2021-06-10 ENCOUNTER — Encounter: Payer: Self-pay | Admitting: *Deleted

## 2021-06-10 ENCOUNTER — Emergency Department
Admission: EM | Admit: 2021-06-10 | Discharge: 2021-06-10 | Disposition: A | Discharge status: Home / Self Care | Payer: No Typology Code available for payment source | Attending: Emergency Medicine | Admitting: Emergency Medicine

## 2021-06-10 ENCOUNTER — Other Ambulatory Visit: Payer: Self-pay

## 2021-06-10 DIAGNOSIS — Z4802 Encounter for removal of sutures: Secondary | ICD-10-CM | POA: Insufficient documentation

## 2021-06-10 NOTE — ED Triage Notes (Signed)
Pt here for staple removal from back of head.  Pt has 2 staples in place.  Pt alert.  ?

## 2021-06-10 NOTE — ED Provider Notes (Signed)
? ?[facility] ?Provider Note ? ? ? Event Date/Time  ? First MD Initiated Contact with Patient 06/10/21 1606   ?  (approximate) ? ? ?History  ? ?Suture / Staple Removal ? ? ?HPI ? ?Cristian Martin is a 76 y.o. male presents emergency department for staple removal.  Patient states no difficulty with his staples.  No fever or chills.  No drainage from the area. ? ?  ? ? ?Physical Exam  ? ?Triage Vital Signs: ?ED Triage Vitals  ?Enc Vitals Group  ?   BP 06/10/21 1526 122/68  ?   Pulse Rate 06/10/21 1526 84  ?   Resp 06/10/21 1526 18  ?   Temp 06/10/21 1526 98.4 ?F (36.9 ?C)  ?   Temp Source 06/10/21 1526 Oral  ?   SpO2 06/10/21 1526 93 %  ?   Weight 06/10/21 1524 188 lb (85.3 kg)  ?   Height 06/10/21 1524 '5\' 11"'$  (1.803 m)  ?   Head Circumference --   ?   Peak Flow --   ?   Pain Score 06/10/21 1524 0  ?   Pain Loc --   ?   Pain Edu? --   ?   Excl. in [place]? --   ? ? ?Most recent vital signs: ?Vitals:  ? 06/10/21 1526  ?BP: 122/68  ?Pulse: 84  ?Resp: 18  ?Temp: 98.4 ?F (36.9 ?C)  ?SpO2: 93%  ? ? ? ?General: Awake, no distress.   ?CV:  Good peripheral perfusion. regular rate and  rhythm ?Resp:  Normal effort.  ?Abd:  No distention.   ?Other:  2 staples are intact, wound is well approximated, no signs of infection ? ? ?ED Results / Procedures / Treatments  ? ?Labs ?(all labs ordered are listed, but only abnormal results are displayed) ?Labs Reviewed - No data to display ? ? ?EKG ? ? ? ? ?RADIOLOGY ? ? ? ? ?PROCEDURES: ? ? ?Marland KitchenSuture Removal ? ?Date/Time: 06/10/2021 4:23 PM ?Performed by: Versie Starks, PA-C ?Authorized by: Versie Starks, PA-C  ? ?Consent:  ?  Consent obtained:  Verbal ?  Consent given by:  Patient ?  Risks, benefits, and alternatives were discussed: yes   ?  Risks discussed:  Bleeding, pain and wound separation ?  Alternatives discussed:  No treatment ?Universal protocol:  ?  Procedure explained and questions answered to patient or proxy's satisfaction: yes   ?  Immediately prior to  procedure, a time out was called: yes   ?  Patient identity confirmed:  Verbally with patient ?Location:  ?  Location:  Head/neck ?  Head/neck location:  Scalp ?Procedure details:  ?  Wound appearance:  No signs of infection ?  Number of staples removed:  2 ?Post-procedure details:  ?  Post-removal:  No dressing applied ?  Procedure completion:  Tolerated well, no immediate complications ? ? ?MEDICATIONS ORDERED IN ED: ?Medications - No data to display ? ? ?IMPRESSION / MDM / ASSESSMENT AND PLAN / ED COURSE  ?I reviewed the triage vital signs and the nursing notes. ?             ?               ? ?Differential diagnosis includes, but is not limited to, staple removal, wound dehiscence, cellulitis ? ?Patient's wound appears to be well approximated and no sign of infection.  2 staples removed.  See procedure note.  Was discharged stable condition. ? ? ? ? ?  ? ? ?  FINAL CLINICAL IMPRESSION(S) / ED DIAGNOSES  ? ?Final diagnoses:  ?Encounter for staple removal  ? ? ? ?Rx / DC Orders  ? ?ED Discharge Orders   ? ? None  ? ?  ? ? ? ?Note:  This document was prepared using Dragon voice recognition software and may include unintentional dictation errors. ? ?  ?Versie Starks, PA-C ?06/10/21 1624 ? ?  ?Rada Hay, MD ?06/10/21 1932 ? ?

## 2021-10-06 ENCOUNTER — Ambulatory Visit (INDEPENDENT_AMBULATORY_CARE_PROVIDER_SITE_OTHER): Payer: No Typology Code available for payment source | Admitting: Neurosurgery

## 2021-10-06 ENCOUNTER — Encounter: Payer: Self-pay | Admitting: Neurosurgery

## 2021-10-06 VITALS — BP 119/76 | HR 94 | Ht 71.0 in | Wt 204.8 lb

## 2021-10-06 DIAGNOSIS — M5116 Intervertebral disc disorders with radiculopathy, lumbar region: Secondary | ICD-10-CM

## 2021-10-06 DIAGNOSIS — M48062 Spinal stenosis, lumbar region with neurogenic claudication: Secondary | ICD-10-CM

## 2021-10-06 NOTE — Progress Notes (Signed)
Referring Physician:  [facility] [address] [place],  [place] [address]  Primary Physician:  [place]  History of Present Illness: 10/06/2021 Mr. Lawernce Earll is here today with a chief complaint of continued pain in his buttocks bilaterally.  He has trouble with walking.  He has been better about his sugars.  History of Present Illness: 04/30/2021 Mr. Jalil Lorusso is here today with a chief complaint of low back pain and bilateral leg pain along with weakness, numbness and tingling.   He has had progressive problems for the past 2 months with aching and nagging pain as bad as 10 out of 10 when he bends, climbs, walks, lifts, or kneels. He can only walk a short period at this point. Rest and laying down makes it better.  Bowel/Bladder Dysfunction: none  Conservative measures:  Physical therapy: has not participated in Multimodal medical therapy including regular antiinflammatories: tylenol, gabapentin, diclofenac gel Injections: has had epidural steroid injections at [facility] (didn't get any relief)  Past Surgery: denies  Markian Glockner has no symptoms of cervical myelopathy.  The symptoms are causing a significant impact on the patient's life.   Review of Systems:  A 10 point review of systems is negative, except for the pertinent positives and negatives detailed in the HPI.  Past Medical History: Past Medical History:  Diagnosis Date   Agent orange exposure    Coronary artery disease    a. 04/2019 Cath: LM 99, LAD 80p, D2 70, LCX 50ost/p, RCA 70p, EF 55-65%; b. 04/2019 CABG x 3 (LIMA to LAD, SVG to OM1, SVG to RCA).   Diabetic peripheral neuropathy (HCC)    Diastolic dysfunction    a 04/2019 Echo: EF 55-60%, mod conc LVH, Gr1 DD. Nl RV fxn. Mild Ao sclerosis w/o stenosis.   GERD (gastroesophageal reflux disease)    Hyperlipidemia    Hyperplasia of prostate with lower urinary tract symptoms (LUTS)    Hypertension    Prostate  cancer [facility]) urologist-- dr ottelin/  oncologist-- dr Tammi Klippel   dx 02-21-2018  -- Stage T1c, Gleason 3+4   Type 2 diabetes mellitus treated with insulin ([place])    followed by [facility] in [place]   Wears dentures    upper    Wears glasses    "wears to help double vision"    Past Surgical History: Past Surgical History:  Procedure Laterality Date   COLONOSCOPY WITH PROPOFOL N/A 08/16/2019   Procedure: COLONOSCOPY WITH PROPOFOL;  Surgeon: Lin Landsman, MD;  Location: [facility] ENDOSCOPY;  Service: Gastroenterology;  Laterality: N/A;   CORONARY ARTERY BYPASS GRAFT N/A 04/30/2019   Procedure: CORONARY ARTERY BYPASS GRAFTING (CABG) TIMES THREE USING LEFT INTERNAL MAMMARY AND RIGHT GREATER SAPHENOUS VEIN HARVESTED ENDOSCOPICALLY. MAMMARY ARTERY TO LAD, SAPHENOUS VEIN GRAFT TO  OM1, SAPHENOUS VEING GRAFT TO RIGHT. BIOPSY OF MEDIASTINAL TISSUE PLACEMENT OF RIGHT FEMORAL A-LINE;  Surgeon: Grace Isaac, MD;  Location: [place];  Service: Open Heart Surgery;  Laterality: N/A;   CYSTOSCOPY N/A 07/14/2018   Procedure: CYSTOSCOPY;  Surgeon: Kathie Rhodes, MD;  Location: [facility];  Service: Urology;  Laterality: N/A;  no seeds in bladder per Dr Karsten Ro   ESOPHAGOGASTRODUODENOSCOPY (EGD) WITH PROPOFOL N/A 08/16/2019   Procedure: ESOPHAGOGASTRODUODENOSCOPY (EGD) WITH PROPOFOL;  Surgeon: Lin Landsman, MD;  Location: [place];  Service: Gastroenterology;  Laterality: N/A;   LEFT HEART CATH AND CORONARY ANGIOGRAPHY N/A 04/27/2019   Procedure: LEFT HEART CATH AND CORONARY ANGIOGRAPHY;  Surgeon: Fletcher Anon,  Mertie Clause, MD;  Location: [place] CV LAB;  Service: Cardiovascular;  Laterality: N/A;   PROSTATE BIOPSY  02-21-2018  dr Karsten Ro in office   RADIOACTIVE SEED IMPLANT N/A 07/14/2018   Procedure: RADIOACTIVE SEED IMPLANT/BRACHYTHERAPY IMPLANT;  Surgeon: Kathie Rhodes, MD;  Location: [place];  Service: Urology;  Laterality: N/A;  77 seeds    SHOULDER ARTHROSCOPY Left  2018   in [place]   "remove spur"   SPACE OAR INSTILLATION N/A 07/14/2018   Procedure: SPACE OAR INSTILLATION;  Surgeon: Kathie Rhodes, MD;  Location: [facility];  Service: Urology;  Laterality: N/A;   TEE WITHOUT CARDIOVERSION N/A 04/30/2019   Procedure: TRANSESOPHAGEAL ECHOCARDIOGRAM (TEE);  Surgeon: Grace Isaac, MD;  Location: [facility];  Service: Open Heart Surgery;  Laterality: N/A;    Allergies: Allergies as of 10/06/2021 - Review Complete 06/10/2021  Allergen Reaction Noted   Ace inhibitors Other (See Comments) 08/01/2013    Medications: No outpatient medications have been marked as taking for the 10/06/21 encounter (Office Visit) with Meade Maw, MD.    Social History: Social History   Tobacco Use   Smoking status: Never   Smokeless tobacco: Never  Vaping Use   Vaping Use: Never used  Substance Use Topics   Alcohol use: Not Currently   Drug use: Never    Family Medical History: Family History  Problem Relation Age of Onset   Stroke Mother    Diabetes Father    Hypertension Father    Diabetes Sister    Cancer Neg Hx     Physical Examination: There were no vitals filed for this visit.  General: Patient is well developed, well nourished, calm, collected, and in no apparent distress. Attention to examination is appropriate.  Neck:   Supple.  Full range of motion.  Respiratory: Patient is breathing without any difficulty.   NEUROLOGICAL:     Awake, alert, oriented to person, place, and time.  Speech is clear and fluent. Fund of knowledge is appropriate.   Cranial Nerves: Pupils equal round and reactive to light.  Facial tone is symmetric.  Facial sensation is symmetric. Shoulder shrug is symmetric. Tongue protrusion is midline.  There is no pronator drift.  ROM of spine: full.    Strength: Side Biceps Triceps Deltoid Interossei Grip Wrist Ext. Wrist Flex.  R '5 5 5 5 5 5 5  '$ L '5 5 5 5 5 5 5   '$ Side Iliopsoas Quads Hamstring PF  DF EHL  R '5 5 5 5 5 5  '$ L '5 5 5 5 5 5   '$ Reflexes are 1+ and symmetric at the biceps, triceps, brachioradialis, patella and achilles.   Hoffman's is absent.  Clonus is not present.  Toes are down-going.  Bilateral upper and lower extremity sensation is intact to light touch.    No evidence of dysmetria noted.  Gait is abnormal and requires a cane.     Medical Decision Making  Imaging: MRI L spine 12/16/2020 IMPRESSION:  1. Lumbar spine spondylosis as described above most severe at L4-5.  2. At L4-5 there is a broad-based disc bulge with a broad central  disc protrusion. Mild bilateral facet arthropathy with ligamentum  flavum infolding. Severe spinal stenosis. Severe bilateral foraminal  stenosis.  3. No acute osseous injury of the lumbar spine.   Electronically Signed    By: Kathreen Devoid M.D.    On: 12/17/2020 10:08  I have personally reviewed the images and agree with the above  interpretation.  Assessment and Plan: Mr. Mccaster is a pleasant 76 y.o. 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  I think this is likely his symptomatic level, but he has had a change in symptoms recently.  We will repeat his MRI scan.  I will also send him for physical therapy.  After his imaging is performed, we will touch base to determine whether proceeding with L4-5 discectomy is appropriate.   I spent a total of 15 minutes in face-to-face and non-face-to-face activities related to this patient's care today.  Thank you for involving me in the care of this patient.      Edlin Ford K. Izora Ribas MD, [facility]

## 2021-10-23 ENCOUNTER — Ambulatory Visit
Admission: RE | Admit: 2021-10-23 | Discharge: 2021-10-23 | Disposition: A | Discharge status: Home / Self Care | Payer: No Typology Code available for payment source | Source: Ambulatory Visit | Attending: Neurosurgery | Admitting: Neurosurgery

## 2021-10-23 DIAGNOSIS — M5116 Intervertebral disc disorders with radiculopathy, lumbar region: Secondary | ICD-10-CM | POA: Insufficient documentation

## 2021-10-23 DIAGNOSIS — M48062 Spinal stenosis, lumbar region with neurogenic claudication: Secondary | ICD-10-CM | POA: Insufficient documentation

## 2021-10-30 ENCOUNTER — Telehealth: Payer: Self-pay

## 2021-10-30 NOTE — Telephone Encounter (Signed)
-----   Message from Peggyann Shoals sent at 10/30/2021  1:28 PM EDT ----- Regarding: MRI results Contact: [phone] Patient had his MRI on 9/22. He has not started PT yet because he thought [facility] wanted to review the MRI to determine if he needed PT or not. Patient is aware that it might be Tuesday of next week before he gets a call

## 2021-11-02 NOTE — Telephone Encounter (Signed)
He is going to reach out to [facility] and set up PT.

## 2021-11-18 ENCOUNTER — Encounter: Payer: Self-pay | Admitting: Physical Therapy

## 2021-11-18 ENCOUNTER — Ambulatory Visit: Payer: PPO | Attending: Neurosurgery | Admitting: Physical Therapy

## 2021-11-18 ENCOUNTER — Other Ambulatory Visit: Payer: Self-pay

## 2021-11-18 DIAGNOSIS — M5459 Other low back pain: Secondary | ICD-10-CM | POA: Diagnosis not present

## 2021-11-18 DIAGNOSIS — R262 Difficulty in walking, not elsewhere classified: Secondary | ICD-10-CM | POA: Diagnosis present

## 2021-11-18 DIAGNOSIS — M5116 Intervertebral disc disorders with radiculopathy, lumbar region: Secondary | ICD-10-CM | POA: Diagnosis not present

## 2021-11-18 DIAGNOSIS — M48062 Spinal stenosis, lumbar region with neurogenic claudication: Secondary | ICD-10-CM | POA: Diagnosis not present

## 2021-11-18 NOTE — Therapy (Signed)
OUTPATIENT PHYSICAL THERAPY THORACOLUMBAR EVALUATION   Patient Name: Cristian Martin MRN: [id number] DOB:03-05-1945, 76 y.o., male Today's Date: 11/18/2021   PT End of Session - 11/18/21 2039     Visit Number 1    Number of Visits 20    Date for PT Re-Evaluation 01/27/22    Authorization Time Period 11/18/21-01/27/22    Authorization - Visit Number 1    Authorization - Number of Visits 20    Progress Note Due on Visit 10    PT Start Time 2706    PT Stop Time 1100    PT Time Calculation (min) 45 min    Activity Tolerance Patient tolerated treatment well    Behavior During Therapy [facility] for tasks assessed/performed             Past Medical History:  Diagnosis Date   Agent orange exposure    Coronary artery disease    a. 04/2019 Cath: LM 99, LAD 80p, D2 70, LCX 50ost/p, RCA 70p, EF 55-65%; b. 04/2019 CABG x 3 (LIMA to LAD, SVG to OM1, SVG to RCA).   Diabetic peripheral neuropathy (HCC)    Diastolic dysfunction    a 04/2019 Echo: EF 55-60%, mod conc LVH, Gr1 DD. Nl RV fxn. Mild Ao sclerosis w/o stenosis.   GERD (gastroesophageal reflux disease)    Hyperlipidemia    Hyperplasia of prostate with lower urinary tract symptoms (LUTS)    Hypertension    Prostate cancer [facility]) urologist-- dr ottelin/  oncologist-- dr Tammi Klippel   dx 02-21-2018  -- Stage T1c, Gleason 3+4   Type 2 diabetes mellitus treated with insulin ([place])    followed by [place] [facility] in [place]   Wears dentures    upper    Wears glasses    "wears to help double vision"   Past Surgical History:  Procedure Laterality Date   COLONOSCOPY WITH PROPOFOL N/A 08/16/2019   Procedure: COLONOSCOPY WITH PROPOFOL;  Surgeon: Lin Landsman, MD;  Location: [place];  Service: Gastroenterology;  Laterality: N/A;   CORONARY ARTERY BYPASS GRAFT N/A 04/30/2019   Procedure: CORONARY ARTERY BYPASS GRAFTING (CABG) TIMES THREE USING LEFT INTERNAL MAMMARY AND RIGHT GREATER SAPHENOUS VEIN HARVESTED ENDOSCOPICALLY. MAMMARY ARTERY TO LAD,  SAPHENOUS VEIN GRAFT TO  OM1, SAPHENOUS VEING GRAFT TO RIGHT. BIOPSY OF MEDIASTINAL TISSUE PLACEMENT OF RIGHT FEMORAL A-LINE;  Surgeon: Grace Isaac, MD;  Location: [place];  Service: Open Heart Surgery;  Laterality: N/A;   CYSTOSCOPY N/A 07/14/2018   Procedure: CYSTOSCOPY;  Surgeon: Kathie Rhodes, MD;  Location: [facility];  Service: Urology;  Laterality: N/A;  no seeds in bladder per Dr Karsten Ro   ESOPHAGOGASTRODUODENOSCOPY (EGD) WITH PROPOFOL N/A 08/16/2019   Procedure: ESOPHAGOGASTRODUODENOSCOPY (EGD) WITH PROPOFOL;  Surgeon: Lin Landsman, MD;  Location: [place];  Service: Gastroenterology;  Laterality: N/A;   LEFT HEART CATH AND CORONARY ANGIOGRAPHY N/A 04/27/2019   Procedure: LEFT HEART CATH AND CORONARY ANGIOGRAPHY;  Surgeon: Wellington Hampshire, MD;  Location: [place] CV LAB;  Service: Cardiovascular;  Laterality: N/A;   PROSTATE BIOPSY  02-21-2018  dr Karsten Ro in office   RADIOACTIVE SEED IMPLANT N/A 07/14/2018   Procedure: RADIOACTIVE SEED IMPLANT/BRACHYTHERAPY IMPLANT;  Surgeon: Kathie Rhodes, MD;  Location: [place];  Service: Urology;  Laterality: N/A;  77 seeds    SHOULDER ARTHROSCOPY Left 2018   in [place]   "remove spur"   SPACE OAR INSTILLATION N/A 07/14/2018   Procedure: SPACE OAR INSTILLATION;  Surgeon: Kathie Rhodes, MD;  Location:  [place];  Service: Urology;  Laterality: N/A;   TEE WITHOUT CARDIOVERSION N/A 04/30/2019   Procedure: TRANSESOPHAGEAL ECHOCARDIOGRAM (TEE);  Surgeon: Grace Isaac, MD;  Location: [place];  Service: Open Heart Surgery;  Laterality: N/A;   Patient Active Problem List   Diagnosis Date Noted   Paresthesia of both feet 04/09/2021   Spinal stenosis, lumbar region, with neurogenic claudication 03/03/2021   Chronic radicular lumbar pain 03/03/2021   Lumbar degenerative disc disease 03/03/2021   Lumbar facet arthropathy 03/03/2021   Chronic pain syndrome 03/03/2021   Iron deficiency  anemia    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 Elayne Guerin   REFERRING DIAG:  [phone] (ICD-10-CM) - Neurogenic claudication due to lumbar spinal stenosis  M51.16 (ICD-10-CM) - Lumbar disc herniation with radiculopathy    Rationale for Evaluation and Treatment Rehabilitation  THERAPY DIAG:  Other low back pain  Difficulty in walking, not elsewhere classified  ONSET DATE: 11/18/2020  SUBJECTIVE:                                                                                                                                                                                           SUBJECTIVE STATEMENT: See pertinent history.  PERTINENT HISTORY:  Pt reports experiencing pain in his low back that spreads to his buttocks especially when standing up and walking. He is unable to walk for long periods of time because of back pain and he needs to take a break. He now uses a quad cane because of his balance. He describes a decline in his function after retiring at 76 years old. He to have a heart bypass.   PAIN:  Are you having pain? Yes: NPRS scale: 8/10 Pain location: L4-L5 low back pain Pain description: Aching  Aggravating factors: Walking for 30 min and needs to sit down  Relieving factors: Horse rub and other cream he gets from [place]. These do not stop the pain    PRECAUTIONS: None  WEIGHT BEARING RESTRICTIONS: No  FALLS:  Has patient fallen in last 6 months? Yes. Number of falls 2 and 4 over the past year  . Falls  happened mostly in the yard and happens when  losing his balance.   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    OBJECTIVE:               VITALS: BP 185/79 HR 79   DIAGNOSTIC FINDINGS:  CLINICAL DATA:  Low back pain, pain radiates down both legs. No known injury.   EXAM: MRI LUMBAR SPINE WITHOUT CONTRAST   TECHNIQUE: Multiplanar, multisequence MR imaging of the lumbar spine was performed. No intravenous contrast was administered.   COMPARISON:  12/16/2020   FINDINGS: Segmentation:  Standard.   Alignment:  Physiologic.   Vertebrae: No acute fracture, evidence of discitis, or aggressive bone lesion.   Conus medullaris and cauda equina: Conus extends to the L1 level. Conus and cauda equina appear normal.   Paraspinal and other soft tissues: No acute paraspinal abnormality.   Disc levels:   Disc spaces: Degenerative disease with disc height loss at L1-2, L2-3, L3-4, L4-5 and L5-S1.   T12-L1: No significant disc bulge. No neural foraminal stenosis. No central canal stenosis.   L1-L2: Mild broad-based disc bulge. Mild spinal stenosis. No foraminal stenosis.   L2-L3: Broad-based disc osteophyte complex flattening the ventral thecal sac. Mild spinal stenosis. No foraminal stenosis.   L3-L4: Broad-based disc osteophyte complex. Mild spinal stenosis. Mild bilateral facet arthropathy. No right foraminal stenosis. Mild left foraminal stenosis.   L4-L5: Broad-based disc osteophyte complex with a broad small central disc protrusion. Moderate bilateral facet arthropathy. Severe spinal stenosis. Severe left and  moderate right foraminal stenosis.   L5-S1: Broad-based disc osteophyte complex. Moderate right and mild left facet arthropathy. Prominence of the epidural fat deforming the thecal sac as can be seen with epidural lipomatosis. No spinal stenosis. Moderate bilateral foraminal stenosis.   IMPRESSION: 1. At L4-5 there is a broad-based disc osteophyte complex with a broad small central disc protrusion. Moderate bilateral facet arthropathy. Severe spinal stenosis. Severe left and moderate right foraminal stenosis. 2. At L5-S1 there is a broad-based disc osteophyte complex. Moderate right and mild left facet arthropathy. Prominence of the epidural fat deforming the thecal sac as can be seen with epidural lipomatosis. No spinal stenosis. Moderate bilateral foraminal stenosis. 3. Otherwise, lumbar spine spondylosis as described above. Overall, no significant interval change compared with the prior exam. 4.  No acute osseous injury of the lumbar spine.     Electronically Signed   By: Kathreen Devoid M.D.   On: 10/26/2021 14:42    PATIENT SURVEYS:  FOTO 54/100  SCREENING FOR RED FLAGS: Bowel or bladder incontinence: No Spinal tumors: No Cauda equina syndrome: No Compression fracture: No Abdominal aneurysm: No  COGNITION:  Overall cognitive status: Within functional limits for tasks assessed     SENSATION: Neuropathy in feet   MUSCLE LENGTH: Hamstrings: Right 70 deg; Left 70 deg (muscular restriction in hamstrings) Thomas test: Negative bilaterally   POSTURE: No Significant postural limitations  PALPATION: L4-L5 central spinous process   LUMBAR ROM:   AROM eval  Flexion 75%*  Extension 50%*  Right lateral flexion 100%  Left lateral flexion 100%  Right rotation 100%  Left rotation 100%   (Blank rows = not tested)  LOWER EXTREMITY ROM:  Active  Right 11/18/2021 Left 11/18/2021  Hip flexion 120 120  Hip extension 30 30  Hip abduction 45 45  Hip adduction 30  30  Hip internal rotation 45 45  Hip external rotation 45 45  Knee flexion 135 135  Knee extension 0 0  Ankle dorsiflexion 20 20  Ankle plantarflexion 50 50  Ankle inversion    Ankle eversion     (Blank rows = not tested)     LOWER EXTREMITY MMT:    MMT Right eval Left eval  Hip flexion 5 5  Hip extension 3+ 3+  Hip abduction 5 5  Hip adduction 3+ 3+  Hip internal rotation    Hip external rotation    Knee flexion 5 5  Knee extension 5 5  Ankle dorsiflexion 5 5  Ankle plantarflexion    Ankle inversion    Ankle eversion     (Blank rows = not tested)  LUMBAR SPECIAL TESTS:  Straight leg raise test: Negative, FABER test: NT, Thomas test: Positive, and FADIR: NT   FUNCTIONAL TESTS:  5 times sit to stand: NT 30 seconds chair stand test 6 minute walk test: Nt 10 meter walk test: nt Dynamic Gait Index: nt   GAIT: Distance walked: 50 ft  Assistive device utilized: Quad cane large base Level of assistance: Modified independence Comments: Decreased step length and stance time    TODAY'S TREATMENT:  [facility] Adult PT Treatment:                                                                                                                            DATE:11/18/21 Initial Lower Trunk Rotation 2 x 10 with 3 sec hold  Knee to Chest 2 x 10 with 3 sec hold  Prone Quad Stretch 3 x 60 sec    PATIENT EDUCATION:  Education details: form and technique and explanation about deficits  Person educated: Patient Education method: Explanation, Demonstration, Tactile cues, Verbal cues, and Handouts Education comprehension: verbalized understanding, returned demonstration, verbal cues required, and tactile cues required   HOME EXERCISE PROGRAM: Access Code: [email or web address] URL: [email or web address] Date: 11/18/2021 Prepared by: Bradly Chris  Exercises - Supine Lower Trunk Rotation  - 1 x daily - 3 sets - 10 reps - Supine Single Knee to Chest Stretch  - 1 x daily - 2  sets - 10 reps - 3 hold - Prone Quadriceps Stretch with Strap  - 1 x daily - 3 reps - 60 hold  ASSESSMENT:  CLINICAL IMPRESSION: Patient is a 76 y.o. white male who was seen today for physical therapy evaluation and treatment for chronic low back pain with signs and symptoms of lumbar stenosis with neurogenic claudication. He presents with deficits that include decreased hip strength, lumbar ROM, and hip mobility and difficulty walking long distances and increased pain. He shows signs and symptoms that place him in movement control classification with moderate pain and disability. Pt will benefit from further skilled PT to improve  aforementioned deficits to return to walking long distances without taking a rest like shopping for groceries or doing yard work.   OBJECTIVE IMPAIRMENTS: Abnormal gait, decreased balance, decreased endurance, decreased mobility, difficulty walking, decreased ROM, decreased strength, hypomobility, impaired flexibility, impaired sensation, and pain.   ACTIVITY LIMITATIONS: carrying, lifting, bending, standing, squatting, stairs, and locomotion level  PARTICIPATION LIMITATIONS: cleaning, shopping, community activity, and yard work  PERSONAL FACTORS: Age, Fitness, Time since onset of injury/illness/exacerbation, and 3+ comorbidities: h/o of prostate cancer, hypertension, T2DM  are also affecting patient's functional outcome.   REHAB POTENTIAL: Fair severity of symptoms and chronicity of condition   CLINICAL DECISION MAKING: Stable/uncomplicated  EVALUATION COMPLEXITY: Low   GOALS: Goals reviewed with patient? No  SHORT TERM GOALS: Target date: 12/02/2021  Pt will be independent with HEP in order to improve strength and balance in order to decrease fall risk and improve function at home and work.  Baseline: NT Goal status: INITIAL  2.  Pt will decrease 5TSTS by at least 3 seconds in order to demonstrate clinically significant improvement in LE  strengt Baseline: NT Goal status: INITIAL  3.  Pt will increase 6MWT by at least 58m(1619f in order to demonstrate clinically significant improvement in cardiopulmonary endurance and community ambulation Baseline: NT Goal status: INITIAL   LONG TERM GOALS: Target date: 01/27/2022  Patient will have improved function and activity level as evidenced by an increase in FOTO score by 10 points or more. 54/100 with target of 100 Baseline:  Goal status: INITIAL  2.  Patient will perform 5 x STS in  <12 sec to demonstrate improved LE strength and to decrease risk of falling.   Baseline: NT  Goal status: INITIAL  3.  Patient will perform >=11 reps on 30 sec chair stands to demonstrate improve LE endurance and to decrease risk of falling  Baseline: NT Goal status: INITIAL  4.  Patient will demonstrate reduced falls risk as evidenced by Dynamic Gait Index (DGI) >19/24. Baseline: NT Goal status: INITIAL  5.  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  PLAN FOR NEXT SESSION: 5 X STS, 30 sec chair stand, DGI, 28mW45mDaniBradly Chris DPT  11/18/2021, 8:49 PM

## 2021-11-23 ENCOUNTER — Encounter: Payer: Self-pay | Admitting: Physical Therapy

## 2021-11-23 ENCOUNTER — Ambulatory Visit: Payer: PPO | Admitting: Physical Therapy

## 2021-11-23 DIAGNOSIS — M5459 Other low back pain: Secondary | ICD-10-CM

## 2021-11-23 DIAGNOSIS — R262 Difficulty in walking, not elsewhere classified: Secondary | ICD-10-CM

## 2021-11-23 NOTE — Therapy (Signed)
OUTPATIENT PHYSICAL THERAPY TREATMENT NOTE   Patient Name: Cristian Martin MRN: [id number] DOB:September 05, 1945, 76 y.o., male Today's Date: 11/23/2021 [facility]CP:  [facility] not [facility]  REFERRING PROVIDER: Dr. Meade Maw  END OF SESSION:   PT End of Session - 11/23/21 0850     Visit Number 2    Number of Visits 20    Date for PT Re-Evaluation 01/27/22    Authorization Time Period 11/18/21-01/27/22    Authorization - Visit Number 2    Authorization - Number of Visits 20    Progress Note Due on Visit 10    PT Start Time 0845    PT Stop Time 0930    PT Time Calculation (min) 45 min    Activity Tolerance Patient tolerated treatment well    Behavior During Therapy [facility] for tasks assessed/performed             Past Medical History:  Diagnosis Date   Agent orange exposure    Coronary artery disease    a. 04/2019 Cath: LM 99, LAD 80p, D2 70, LCX 50ost/p, RCA 70p, EF 55-65%; b. 04/2019 CABG x 3 (LIMA to LAD, SVG to OM1, SVG to RCA).   Diabetic peripheral neuropathy (HCC)    Diastolic dysfunction    a 04/2019 Echo: EF 55-60%, mod conc LVH, Gr1 DD. Nl RV fxn. Mild Ao sclerosis w/o stenosis.   GERD (gastroesophageal reflux disease)    Hyperlipidemia    Hyperplasia of prostate with lower urinary tract symptoms (LUTS)    Hypertension    Prostate cancer [facility]) urologist-- dr ottelin/  oncologist-- dr Tammi Klippel   dx 02-21-2018  -- Stage T1c, Gleason 3+4   Type 2 diabetes mellitus treated with insulin ([place])    followed by [facility] in [place]   Wears dentures    upper    Wears glasses    "wears to help double vision"   Past Surgical History:  Procedure Laterality Date   COLONOSCOPY WITH PROPOFOL N/A 08/16/2019   Procedure: COLONOSCOPY WITH PROPOFOL;  Surgeon: Lin Landsman, MD;  Location: [place];  Service: Gastroenterology;  Laterality: N/A;   CORONARY ARTERY BYPASS GRAFT N/A 04/30/2019   Procedure: CORONARY ARTERY BYPASS GRAFTING (CABG) TIMES THREE USING LEFT INTERNAL MAMMARY AND  RIGHT GREATER SAPHENOUS VEIN HARVESTED ENDOSCOPICALLY. MAMMARY ARTERY TO LAD, SAPHENOUS VEIN GRAFT TO  OM1, SAPHENOUS VEING GRAFT TO RIGHT. BIOPSY OF MEDIASTINAL TISSUE PLACEMENT OF RIGHT FEMORAL A-LINE;  Surgeon: Grace Isaac, MD;  Location: [place];  Service: Open Heart Surgery;  Laterality: N/A;   CYSTOSCOPY N/A 07/14/2018   Procedure: CYSTOSCOPY;  Surgeon: Kathie Rhodes, MD;  Location: [facility];  Service: Urology;  Laterality: N/A;  no seeds in bladder per Dr Karsten Ro   ESOPHAGOGASTRODUODENOSCOPY (EGD) WITH PROPOFOL N/A 08/16/2019   Procedure: ESOPHAGOGASTRODUODENOSCOPY (EGD) WITH PROPOFOL;  Surgeon: Lin Landsman, MD;  Location: [place];  Service: Gastroenterology;  Laterality: N/A;   LEFT HEART CATH AND CORONARY ANGIOGRAPHY N/A 04/27/2019   Procedure: LEFT HEART CATH AND CORONARY ANGIOGRAPHY;  Surgeon: Wellington Hampshire, MD;  Location: [place] [facility];  Service: Cardiovascular;  Laterality: N/A;   PROSTATE BIOPSY  02-21-2018  dr Karsten Ro in office   RADIOACTIVE SEED IMPLANT N/A 07/14/2018   Procedure: RADIOACTIVE SEED IMPLANT/BRACHYTHERAPY IMPLANT;  Surgeon: Kathie Rhodes, MD;  Location: [place];  Service: Urology;  Laterality: N/A;  76 seeds    SHOULDER ARTHROSCOPY Left 2018   in [place]   "remove spur"   SPACE OAR INSTILLATION  N/A 07/14/2018   Procedure: SPACE OAR INSTILLATION;  Surgeon: Kathie Rhodes, MD;  Location: [facility];  Service: Urology;  Laterality: N/A;   TEE WITHOUT CARDIOVERSION N/A 04/30/2019   Procedure: TRANSESOPHAGEAL ECHOCARDIOGRAM (TEE);  Surgeon: Grace Isaac, MD;  Location: [place];  Service: Open Heart Surgery;  Laterality: N/A;   Patient Active Problem List   Diagnosis Date Noted   Paresthesia of both feet 04/09/2021   Spinal stenosis, lumbar region, with neurogenic claudication 03/03/2021   Chronic radicular lumbar pain 03/03/2021   Lumbar degenerative disc disease 03/03/2021   Lumbar facet  arthropathy 03/03/2021   Chronic pain syndrome 03/03/2021   Iron deficiency anemia    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   Coronary artery disease 04/30/2019   Unstable angina ([place]) 04/27/2019   Malignant neoplasm of prostate ([place]) 04/09/2018   GERD (gastroesophageal reflux disease) 04/07/2018   Hx of colonic polyps 04/07/2018   Benign essential hypertension 12/02/2015   Hyperlipidemia, mixed 05/02/2015   Chronic painful diabetic neuropathy ([place]) 03/13/2015   DM (diabetes mellitus) type II controlled with renal manifestation ([place]) 08/08/2013    REFERRING DIAG:  \K24.097 (ICD-10-CM) - Neurogenic claudication due to lumbar spinal stenosis M51.16 (ICD-10-CM) - Lumbar disc herniation with radiculopathy   THERAPY DIAG:  Other low back pain  Difficulty in walking, not elsewhere classified  Rationale for Evaluation and Treatment Rehabilitation  PERTINENT HISTORY: Pt reports experiencing pain in his low back that spreads to his buttocks especially when standing up and walking. He is unable to walk for long periods of time because of back pain and he needs to take a break. He now uses a quad cane because of his balance. He describes a decline in his function after retiring at 76 years old. He to have a heart bypass.   PRECAUTIONS: Falls   SUBJECTIVE:                                                                                                                                                                                      SUBJECTIVE STATEMENT:  Pt reports that he is still working on getting a mat for his exercises.    PAIN:  Are you having pain? No   OBJECTIVE: (objective measures completed at initial evaluation unless otherwise dated)              VITALS: BP 185/79 HR 79    DIAGNOSTIC FINDINGS:  CLINICAL DATA:  Low back pain, pain radiates down both legs. No known injury.   EXAM: MRI LUMBAR SPINE WITHOUT CONTRAST   TECHNIQUE: Multiplanar, multisequence MR imaging of the lumbar  spine was performed. No intravenous contrast was  administered.   COMPARISON:  12/16/2020   FINDINGS: Segmentation:  Standard.   Alignment:  Physiologic.   Vertebrae: No acute fracture, evidence of discitis, or aggressive bone lesion.   Conus medullaris and cauda equina: Conus extends to the L1 level. Conus and cauda equina appear normal.   Paraspinal and other soft tissues: No acute paraspinal abnormality.   Disc levels:   Disc spaces: Degenerative disease with disc height loss at L1-2, L2-3, L3-4, L4-5 and L5-S1.   T12-L1: No significant disc bulge. No neural foraminal stenosis. No central canal stenosis.   L1-L2: Mild broad-based disc bulge. Mild spinal stenosis. No foraminal stenosis.   L2-L3: Broad-based disc osteophyte complex flattening the ventral thecal sac. Mild spinal stenosis. No foraminal stenosis.   L3-L4: Broad-based disc osteophyte complex. Mild spinal stenosis. Mild bilateral facet arthropathy. No right foraminal stenosis. Mild left foraminal stenosis.   L4-L5: Broad-based disc osteophyte complex with a broad small central disc protrusion. Moderate bilateral facet arthropathy. Severe spinal stenosis. Severe left and moderate right foraminal stenosis.   L5-S1: Broad-based disc osteophyte complex. Moderate right and mild left facet arthropathy. Prominence of the epidural fat deforming the thecal sac as can be seen with epidural lipomatosis. No spinal stenosis. Moderate bilateral foraminal stenosis.   IMPRESSION: 1. At L4-5 there is a broad-based disc osteophyte complex with a broad small central disc protrusion. Moderate bilateral facet arthropathy. Severe spinal stenosis. Severe left and moderate right foraminal stenosis. 2. At L5-S1 there is a broad-based disc osteophyte complex. Moderate right and mild left facet arthropathy. Prominence of the epidural fat deforming the thecal sac as can be seen with epidural lipomatosis. No spinal stenosis.  Moderate bilateral foraminal stenosis. 3. Otherwise, lumbar spine spondylosis as described above. Overall, no significant interval change compared with the prior exam. 4.  No acute osseous injury of the lumbar spine.     Electronically Signed   By: Kathreen Devoid M.D.   On: 10/26/2021 14:42     PATIENT SURVEYS:  FOTO 54/100   SCREENING FOR RED FLAGS: Bowel or bladder incontinence: No Spinal tumors: No Cauda equina syndrome: No Compression fracture: No Abdominal aneurysm: No   COGNITION:           Overall cognitive status: Within functional limits for tasks assessed                          SENSATION: Neuropathy in feet    MUSCLE LENGTH: Hamstrings: Right 70 deg; Left 70 deg (muscular restriction in hamstrings) Thomas test: Negative bilaterally    POSTURE: No Significant postural limitations   PALPATION: L4-L5 central spinous process    LUMBAR ROM:    AROM eval  Flexion 75%*  Extension 50%*  Right lateral flexion 100%  Left lateral flexion 100%  Right rotation 100%  Left rotation 100%   (Blank rows = not tested)   LOWER EXTREMITY ROM:      Active  Right 11/18/2021 Left 11/18/2021  Hip flexion 120 120  Hip extension 30 30  Hip abduction 45 45  Hip adduction 30 30  Hip internal rotation 45 45  Hip external rotation 45 45  Knee flexion 135 135  Knee extension 0 0  Ankle dorsiflexion 20 20  Ankle plantarflexion 50 50  Ankle inversion      Ankle eversion       (Blank rows = not tested)        LOWER EXTREMITY MMT:     MMT Right  eval Left eval  Hip flexion 5 5  Hip extension 3+ 3+  Hip abduction 5 5  Hip adduction 3+ 3+  Hip internal rotation      Hip external rotation      Knee flexion 5 5  Knee extension 5 5  Ankle dorsiflexion 5 5  Ankle plantarflexion      Ankle inversion      Ankle eversion       (Blank rows = not tested)   LUMBAR SPECIAL TESTS:  Straight leg raise test: Negative, FABER test: NT, Thomas test: Positive, and FADIR: NT     FUNCTIONAL TESTS:  5 times sit to stand: NT 30 seconds chair stand test 6 minute walk test: 725 ft with quad cane  10 meter walk test: nt Dynamic Gait Index: nt    GAIT: Distance walked: 50 ft  Assistive device utilized: Quad cane large base Level of assistance: Modified independence Comments: Decreased step length and stance time       TODAY'S TREATMENT:  [facility] Adult PT Treatment:                                                                                                                             DATE:11/23/21                          61mT: 725 ft with use of quad cane                                               -HR 107, SpO2 96%                         5xSTS: 22 sec at 18 inch mat height                          30 sec: 7 reps                             75-77 <11 reps for men indicates risk of falling                          DGI: 14/24                           -Severe impairment for obstacles and changing direction                           -Moderate impairment for steps                            -  Mild impairment for head turns                          Sit to Stand with no UE support from 18 inch mat height 3 x 10                           -min VC to decrease eccentric speed and increase concentric speed                                                                                                                                                     DATE:11/18/21 Initial                         Lower Trunk Rotation 2 x 10 with 3 sec hold  Knee to Chest 2 x 10 with 3 sec hold  Prone Quad Stretch 3 x 60 sec      PATIENT EDUCATION:  Education details: form and technique and explanation about deficits  Person educated: Patient Education method: Explanation, Demonstration, Tactile cues, Verbal cues, and Handouts Education comprehension: verbalized understanding, returned demonstration, verbal cues required, and tactile cues required     HOME EXERCISE  PROGRAM: Access Code: [email or web address] URL: [email or web address] Date: 11/23/2021 Prepared by: Bradly Chris  Exercises - Supine Lower Trunk Rotation  - 1 x daily - 3 sets - 10 reps - Supine Single Knee to Chest Stretch  - 1 x daily - 2 sets - 10 reps - 3 hold - Prone Quadriceps Stretch with Strap  - 1 x daily - 3 reps - 60 hold - Sit to Stand with Arms Crossed  - 3 x weekly - 3 sets - 10 reps   ASSESSMENT:   CLINICAL IMPRESSION:  Pt presents for f/u treatment for chonic low back pain and falls. He shows decreased aerobic endurance due to an increase in his low back pain requiring him to take a standing rest break and decreased LE strength and endurance that place him at an increased risk for falls. He also shows decreased dynamic balance with severe impairments with stepping over obstacles and turning which place him at an increased risk for falls. Pt able to perform sit to stand exercise without UE support from 18 inch mat height without an increase in his low back pain. He will continue to benefit from further skilled PT to improve aforementioned deficits to return to walking long distances without taking a rest like shopping for groceries or doing yard work.    OBJECTIVE IMPAIRMENTS: Abnormal gait, decreased balance, decreased endurance, decreased mobility, difficulty walking, decreased ROM, decreased strength, hypomobility, impaired flexibility, impaired sensation, and pain.    ACTIVITY LIMITATIONS: carrying, lifting, bending, standing, squatting, stairs, and locomotion level   PARTICIPATION  LIMITATIONS: cleaning, shopping, community activity, and yard work   PERSONAL FACTORS: Age, Fitness, Time since onset of injury/illness/exacerbation, and 3+ comorbidities: h/o of prostate cancer, hypertension, T2DM  are also affecting patient's functional outcome.    REHAB POTENTIAL: Fair severity of symptoms and chronicity of condition    CLINICAL DECISION MAKING: Stable/uncomplicated    EVALUATION COMPLEXITY: Low     GOALS: Goals reviewed with patient? No   SHORT TERM GOALS: Target date: 12/02/2021   Pt will be independent with HEP in order to improve strength and balance in order to decrease fall risk and improve function at home and work.   Baseline: NT Goal status: Ongoing    2.  Pt will decrease 5TSTS by at least 3 seconds in order to demonstrate clinically significant improvement in LE strengt Baseline: NT Goal status: Ongoing    3.  Pt will increase 6MWT by at least 80m(1628f in order to demonstrate clinically significant improvement in cardiopulmonary endurance and community ambulation Baseline: 725 ft with use of quad cane  Goal status: Ongoing      LONG TERM GOALS: Target date: 01/27/2022   Patient will have improved function and activity level as evidenced by an increase in FOTO score by 10 points or more.  Baseline: 54/100 with target of 100 Goal status: Ongoing    2.  Patient will perform 5 x STS in  <12 sec to demonstrate improved LE strength and to decrease risk of falling.   Baseline: 22 sec  Goal status: Ongoing    3.  Patient will perform >=11 reps on 30 sec chair stands to demonstrate improve LE endurance and to decrease risk of falling  Baseline: 7 reps  Goal status: Ongoing    4.  Patient will demonstrate reduced falls risk as evidenced by Dynamic Gait Index (DGI) >19/24. Baseline: 14/24 Goal status: Ongoing    5.  Patient will ambulate >=1,000 ft during 55m88mto show improved aerobic endurance and ability to walk distance that signifies he is a comHydrographic surveyoraseline: 725 ft with use of quad cane  Goal status: Ongoing      PLAN: PT FREQUENCY: 1-2x/week   PT DURATION: 10 weeks   PLANNED INTERVENTIONS: Therapeutic exercises, Neuromuscular re-education, Balance training, Gait training, Patient/Family education, Self Care, Joint mobilization, Joint manipulation, Stair training, Vestibular training, DME instructions, Aquatic  Therapy, Dry Needling, Electrical stimulation, Spinal manipulation, Spinal mobilization, Cryotherapy, Moist heat, Traction, Manual therapy, and Re-evaluation.   PLAN FOR NEXT SESSION: Falls training, Dynamic balance exercises and continue LE strengthening   DanBradly Chris, DPT  11/23/2021, 8:51 AM

## 2021-11-26 ENCOUNTER — Encounter: Payer: Self-pay | Admitting: Physical Therapy

## 2021-11-26 ENCOUNTER — Ambulatory Visit: Payer: PPO | Admitting: Physical Therapy

## 2021-11-26 DIAGNOSIS — R262 Difficulty in walking, not elsewhere classified: Secondary | ICD-10-CM

## 2021-11-26 DIAGNOSIS — M5459 Other low back pain: Secondary | ICD-10-CM | POA: Diagnosis not present

## 2021-11-26 NOTE — Therapy (Signed)
OUTPATIENT PHYSICAL THERAPY TREATMENT NOTE   Patient Name: Cristian Martin MRN: [id number] DOB:10-26-1945, 76 y.o., male Today's Date: 11/26/2021 [facility]CP:  [facility] not [facility]  REFERRING PROVIDER: Dr. Meade Maw  END OF SESSION:   PT End of Session - 11/26/21 1019     Visit Number 3    Number of Visits 20    Date for PT Re-Evaluation 01/27/22    Authorization Time Period 11/18/21-01/27/22    Authorization - Visit Number 2    Authorization - Number of Visits 20    Progress Note Due on Visit 10    PT Start Time 4481    PT Stop Time 1100    PT Time Calculation (min) 45 min    Equipment Utilized During Treatment Gait belt    Activity Tolerance Patient tolerated treatment well    Behavior During Therapy WFL for tasks assessed/performed             Past Medical History:  Diagnosis Date   Agent orange exposure    Coronary artery disease    a. 04/2019 Cath: LM 99, LAD 80p, D2 70, LCX 50ost/p, RCA 70p, EF 55-65%; b. 04/2019 CABG x 3 (LIMA to LAD, SVG to OM1, SVG to RCA).   Diabetic peripheral neuropathy (HCC)    Diastolic dysfunction    a 04/2019 Echo: EF 55-60%, mod conc LVH, Gr1 DD. Nl RV fxn. Mild Ao sclerosis w/o stenosis.   GERD (gastroesophageal reflux disease)    Hyperlipidemia    Hyperplasia of prostate with lower urinary tract symptoms (LUTS)    Hypertension    Prostate cancer [facility]) urologist-- dr ottelin/  oncologist-- dr Tammi Klippel   dx 02-21-2018  -- Stage T1c, Gleason 3+4   Type 2 diabetes mellitus treated with insulin ([place])    followed by [facility] in [place]   Wears dentures    upper    Wears glasses    "wears to help double vision"   Past Surgical History:  Procedure Laterality Date   COLONOSCOPY WITH PROPOFOL N/A 08/16/2019   Procedure: COLONOSCOPY WITH PROPOFOL;  Surgeon: Lin Landsman, MD;  Location: [facility];  Service: Gastroenterology;  Laterality: N/A;   CORONARY ARTERY BYPASS GRAFT N/A 04/30/2019   Procedure: CORONARY ARTERY BYPASS GRAFTING  (CABG) TIMES THREE USING LEFT INTERNAL MAMMARY AND RIGHT GREATER SAPHENOUS VEIN HARVESTED ENDOSCOPICALLY. MAMMARY ARTERY TO LAD, SAPHENOUS VEIN GRAFT TO  OM1, SAPHENOUS VEING GRAFT TO RIGHT. BIOPSY OF MEDIASTINAL TISSUE PLACEMENT OF RIGHT FEMORAL A-LINE;  Surgeon: Grace Isaac, MD;  Location: [facility];  Service: Open Heart Surgery;  Laterality: N/A;   CYSTOSCOPY N/A 07/14/2018   Procedure: CYSTOSCOPY;  Surgeon: Kathie Rhodes, MD;  Location: [facility];  Service: Urology;  Laterality: N/A;  no seeds in bladder per Dr Karsten Ro   ESOPHAGOGASTRODUODENOSCOPY (EGD) WITH PROPOFOL N/A 08/16/2019   Procedure: ESOPHAGOGASTRODUODENOSCOPY (EGD) WITH PROPOFOL;  Surgeon: Lin Landsman, MD;  Location: [facility];  Service: Gastroenterology;  Laterality: N/A;   LEFT HEART CATH AND CORONARY ANGIOGRAPHY N/A 04/27/2019   Procedure: LEFT HEART CATH AND CORONARY ANGIOGRAPHY;  Surgeon: Wellington Hampshire, MD;  Location: [facility];  Service: Cardiovascular;  Laterality: N/A;   PROSTATE BIOPSY  02-21-2018  dr Karsten Ro in office   RADIOACTIVE SEED IMPLANT N/A 07/14/2018   Procedure: RADIOACTIVE SEED IMPLANT/BRACHYTHERAPY IMPLANT;  Surgeon: Kathie Rhodes, MD;  Location: [facility];  Service: Urology;  Laterality: N/A;  76 seeds    SHOULDER ARTHROSCOPY Left 2018   in [place]   "  remove spur"   SPACE OAR INSTILLATION N/A 07/14/2018   Procedure: SPACE OAR INSTILLATION;  Surgeon: Kathie Rhodes, MD;  Location: [facility];  Service: Urology;  Laterality: N/A;   TEE WITHOUT CARDIOVERSION N/A 04/30/2019   Procedure: TRANSESOPHAGEAL ECHOCARDIOGRAM (TEE);  Surgeon: Grace Isaac, MD;  Location: [facility];  Service: Open Heart Surgery;  Laterality: N/A;   Patient Active Problem List   Diagnosis Date Noted   Paresthesia of both feet 04/09/2021   Spinal stenosis, lumbar region, with neurogenic claudication 03/03/2021   Chronic radicular lumbar pain 03/03/2021   Lumbar  degenerative disc disease 03/03/2021   Lumbar facet arthropathy 03/03/2021   Chronic pain syndrome 03/03/2021   Iron deficiency anemia    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   Coronary artery disease 04/30/2019   Unstable angina ([place]) 04/27/2019   Malignant neoplasm of prostate ([place]) 04/09/2018   GERD (gastroesophageal reflux disease) 04/07/2018   Hx of colonic polyps 04/07/2018   Benign essential hypertension 12/02/2015   Hyperlipidemia, mixed 05/02/2015   Chronic painful diabetic neuropathy ([place]) 03/13/2015   DM (diabetes mellitus) type II controlled with renal manifestation ([place]) 08/08/2013    REFERRING DIAG:  \Y24.825 (ICD-10-CM) - Neurogenic claudication due to lumbar spinal stenosis M51.16 (ICD-10-CM) - Lumbar disc herniation with radiculopathy   THERAPY DIAG:  Other low back pain  Difficulty in walking, not elsewhere classified  Rationale for Evaluation and Treatment Rehabilitation  PERTINENT HISTORY: Pt reports experiencing pain in his low back that spreads to his buttocks especially when standing up and walking. He is unable to walk for long periods of time because of back pain and he needs to take a break. He now uses a quad cane because of his balance. He describes a decline in his function after retiring at 76 years old. He to have a heart bypass.   PRECAUTIONS: Falls   SUBJECTIVE:                                                                                                                                                                                      SUBJECTIVE STATEMENT:  Pt reports that he ordered from online and it should be arriving today. He has been feeling sore from exercises and he had to take a few days break.    PAIN:  Are you having pain? No   OBJECTIVE: (objective measures completed at initial evaluation unless otherwise dated)              VITALS: BP 185/79 HR 79    DIAGNOSTIC FINDINGS:  CLINICAL DATA:  Low back pain, pain radiates down both legs.  No known injury.   EXAM:  MRI LUMBAR SPINE WITHOUT CONTRAST   TECHNIQUE: Multiplanar, multisequence MR imaging of the lumbar spine was performed. No intravenous contrast was administered.   COMPARISON:  12/16/2020   FINDINGS: Segmentation:  Standard.   Alignment:  Physiologic.   Vertebrae: No acute fracture, evidence of discitis, or aggressive bone lesion.   Conus medullaris and cauda equina: Conus extends to the L1 level. Conus and cauda equina appear normal.   Paraspinal and other soft tissues: No acute paraspinal abnormality.   Disc levels:   Disc spaces: Degenerative disease with disc height loss at L1-2, L2-3, L3-4, L4-5 and L5-S1.   T12-L1: No significant disc bulge. No neural foraminal stenosis. No central canal stenosis.   L1-L2: Mild broad-based disc bulge. Mild spinal stenosis. No foraminal stenosis.   L2-L3: Broad-based disc osteophyte complex flattening the ventral thecal sac. Mild spinal stenosis. No foraminal stenosis.   L3-L4: Broad-based disc osteophyte complex. Mild spinal stenosis. Mild bilateral facet arthropathy. No right foraminal stenosis. Mild left foraminal stenosis.   L4-L5: Broad-based disc osteophyte complex with a broad small central disc protrusion. Moderate bilateral facet arthropathy. Severe spinal stenosis. Severe left and moderate right foraminal stenosis.   L5-S1: Broad-based disc osteophyte complex. Moderate right and mild left facet arthropathy. Prominence of the epidural fat deforming the thecal sac as can be seen with epidural lipomatosis. No spinal stenosis. Moderate bilateral foraminal stenosis.   IMPRESSION: 1. At L4-5 there is a broad-based disc osteophyte complex with a broad small central disc protrusion. Moderate bilateral facet arthropathy. Severe spinal stenosis. Severe left and moderate right foraminal stenosis. 2. At L5-S1 there is a broad-based disc osteophyte complex. Moderate right and mild left facet  arthropathy. Prominence of the epidural fat deforming the thecal sac as can be seen with epidural lipomatosis. No spinal stenosis. Moderate bilateral foraminal stenosis. 3. Otherwise, lumbar spine spondylosis as described above. Overall, no significant interval change compared with the prior exam. 4.  No acute osseous injury of the lumbar spine.     Electronically Signed   By: Kathreen Devoid M.D.   On: 10/26/2021 14:42     PATIENT SURVEYS:  FOTO 54/100   SCREENING FOR RED FLAGS: Bowel or bladder incontinence: No Spinal tumors: No Cauda equina syndrome: No Compression fracture: No Abdominal aneurysm: No   COGNITION:           Overall cognitive status: Within functional limits for tasks assessed                          SENSATION: Neuropathy in feet    MUSCLE LENGTH: Hamstrings: Right 70 deg; Left 70 deg (muscular restriction in hamstrings) Thomas test: Negative bilaterally    POSTURE: No Significant postural limitations   PALPATION: L4-L5 central spinous process    LUMBAR ROM:    AROM eval  Flexion 75%*  Extension 50%*  Right lateral flexion 100%  Left lateral flexion 100%  Right rotation 100%  Left rotation 100%   (Blank rows = not tested)   LOWER EXTREMITY ROM:      Active  Right 11/18/2021 Left 11/18/2021  Hip flexion 120 120  Hip extension 30 30  Hip abduction 45 45  Hip adduction 30 30  Hip internal rotation 45 45  Hip external rotation 45 45  Knee flexion 135 135  Knee extension 0 0  Ankle dorsiflexion 20 20  Ankle plantarflexion 50 50  Ankle inversion      Ankle eversion       (  Blank rows = not tested)        LOWER EXTREMITY MMT:     MMT Right eval Left eval  Hip flexion 5 5  Hip extension 3+ 3+  Hip abduction 5 5  Hip adduction 3+ 3+  Hip internal rotation      Hip external rotation      Knee flexion 5 5  Knee extension 5 5  Ankle dorsiflexion 5 5  Ankle plantarflexion      Ankle inversion      Ankle eversion       (Blank  rows = not tested)   LUMBAR SPECIAL TESTS:  Straight leg raise test: Negative, FABER test: NT, Thomas test: Positive, and FADIR: NT    FUNCTIONAL TESTS:  5 times sit to stand: 22 sec  30 seconds chair stand test 6 minute walk test: 725 ft with quad cane  10 meter walk test: nt Dynamic Gait Index: nt    GAIT: Distance walked: 50 ft  Assistive device utilized: Quad cane large base Level of assistance: Modified independence Comments: Decreased step length and stance time       TODAY'S TREATMENT:  [facility] Adult PT Treatment:                                                                                                                             DATE:11/26/21   THEREX   Falls training  -Sequence demonstration supine to side lying to full kneel to half knee with UE support to full stand  Sit to Stand 1 x 10 from 20 inch mat height   Sit to Stand 2 x 9 from 18 inch mat height    NMR   Figure 8 -10 ft x 30            Amb with horizontal head turns 10 m x 30             Amb with vertical head turns 10 m x 30                                                                                                                                      DATE:11/23/21                          74mT: 725 ft with use of quad cane                                               -  HR 107, SpO2 96%                         5xSTS: 22 sec at 18 inch mat height                          30 sec: 7 reps                             75-77 <11 reps for men indicates risk of falling                          DGI: 14/24                           -Severe impairment for obstacles and changing direction                           -Moderate impairment for steps                            -Mild impairment for head turns                          Sit to Stand with no UE support from 18 inch mat height 3 x 10                           -min VC to decrease eccentric speed and increase concentric speed                                                                                                                                                      DATE:11/18/21 Initial                         Lower Trunk Rotation 2 x 10 with 3 sec hold  Knee to Chest 2 x 10 with 3 sec hold  Prone Quad Stretch 3 x 60 sec      PATIENT EDUCATION:  Education details: form and technique and explanation about deficits  Person educated: Patient Education method: Explanation, Demonstration, Tactile cues, Verbal cues, and Handouts Education comprehension: verbalized understanding, returned demonstration, verbal cues required, and tactile cues required     HOME EXERCISE PROGRAM: Access Code: [email or web address] URL: [email or web address] Date: 11/26/2021 Prepared by: Bradly Chris  Exercises - Supine Lower Trunk Rotation  - 1 x daily - 3 sets - 10 reps - Supine Single Knee to Chest Stretch  - 1 x daily - 2 sets -  10 reps - 3 hold - Prone Quadriceps Stretch with Strap  - 1 x daily - 3 reps - 60 hold - Sit to Stand with Arms Crossed  - 3 x weekly - 3 sets - 10 reps - Figure-8 Walking Around [place]  - 1 x daily - 10 reps  ASSESSMENT:   CLINICAL IMPRESSION:  Pt presents for f/u treatment for chonic low back pain and falls. He exhibits an improvement in LE strength and dynamic balance with ability to perform an increased number of sit to stands from a decreased height and perform head turns and figure 8s with little to no lateral sway. He will continue to benefit from further skilled PT to improve aforementioned deficits to return to walking long distances without taking a rest like shopping for groceries or doing yard work.   OBJECTIVE IMPAIRMENTS: Abnormal gait, decreased balance, decreased endurance, decreased mobility, difficulty walking, decreased ROM, decreased strength, hypomobility, impaired flexibility, impaired sensation, and pain.    ACTIVITY LIMITATIONS: carrying, lifting, bending, standing, squatting, stairs, and  locomotion level   PARTICIPATION LIMITATIONS: cleaning, shopping, community activity, and yard work   PERSONAL FACTORS: Age, Fitness, Time since onset of injury/illness/exacerbation, and 3+ comorbidities: h/o of prostate cancer, hypertension, T2DM  are also affecting patient's functional outcome.    REHAB POTENTIAL: Fair severity of symptoms and chronicity of condition    CLINICAL DECISION MAKING: Stable/uncomplicated   EVALUATION COMPLEXITY: Low     GOALS: Goals reviewed with patient? No   SHORT TERM GOALS: Target date: 12/02/2021   Pt will be independent with HEP in order to improve strength and balance in order to decrease fall risk and improve function at home and work.   Baseline: NT Goal status: Ongoing    2.  Pt will decrease 5TSTS by at least 3 seconds in order to demonstrate clinically significant improvement in LE strengt Baseline: 22 sec  Goal status: Ongoing    3.  Pt will increase 6MWT by at least 846m(1636f in order to demonstrate clinically significant improvement in cardiopulmonary endurance and community ambulation Baseline: 725 ft with use of quad cane  Goal status: Ongoing      LONG TERM GOALS: Target date: 01/27/2022   Patient will have improved function and activity level as evidenced by an increase in FOTO score by 10 points or more.  Baseline: 54/100 with target of 100 Goal status: Ongoing    2.  Patient will perform 5 x STS in  <12 sec to demonstrate improved LE strength and to decrease risk of falling.   Baseline: 22 sec  Goal status: Ongoing    3.  Patient will perform >=11 reps on 30 sec chair stands to demonstrate improve LE endurance and to decrease risk of falling  Baseline: 7 reps  Goal status: Ongoing    4.  Patient will demonstrate reduced falls risk as evidenced by Dynamic Gait Index (DGI) >19/24. Baseline: 14/24 Goal status: Ongoing    5.  Patient will ambulate >=1,000 ft during 46m33mto show improved aerobic endurance and ability to  walk distance that signifies he is a comHydrographic surveyoraseline: 725 ft with use of quad cane  Goal status: Ongoing      PLAN: PT FREQUENCY: 1-2x/week   PT DURATION: 10 weeks   PLANNED INTERVENTIONS: Therapeutic exercises, Neuromuscular re-education, Balance training, Gait training, Patient/Family education, Self Care, Joint mobilization, Joint manipulation, Stair training, Vestibular training, DME instructions, Aquatic Therapy, Dry Needling, Electrical stimulation, Spinal manipulation, Spinal mobilization, Cryotherapy, Moist heat,  Traction, Manual therapy, and Re-evaluation.   PLAN FOR NEXT SESSION:  Dynamic balance exercises and continue LE strengthening. Give patient dietary information.   Bradly Chris PT, DPT  11/26/2021, 11:01 AM

## 2021-12-01 ENCOUNTER — Ambulatory Visit (INDEPENDENT_AMBULATORY_CARE_PROVIDER_SITE_OTHER): Payer: PPO | Admitting: Neurosurgery

## 2021-12-01 ENCOUNTER — Encounter: Payer: Self-pay | Admitting: Physical Therapy

## 2021-12-01 ENCOUNTER — Encounter: Payer: Self-pay | Admitting: Neurosurgery

## 2021-12-01 ENCOUNTER — Ambulatory Visit: Payer: PPO | Admitting: Physical Therapy

## 2021-12-01 VITALS — BP 138/82 | Ht 71.0 in | Wt 206.0 lb

## 2021-12-01 DIAGNOSIS — M48062 Spinal stenosis, lumbar region with neurogenic claudication: Secondary | ICD-10-CM

## 2021-12-01 DIAGNOSIS — M5459 Other low back pain: Secondary | ICD-10-CM | POA: Diagnosis not present

## 2021-12-01 DIAGNOSIS — R262 Difficulty in walking, not elsewhere classified: Secondary | ICD-10-CM

## 2021-12-01 NOTE — Progress Notes (Signed)
Referring Physician:  [facility] [address] [place],  [place] [address]  Primary Physician:  [facility]  History of Present Illness: 12/01/2021 Cristian Martin is doing somewhat better.  He is started physical therapy and feels that he is making progress.  10/06/2021 [name] is here today with a chief complaint of continued pain in his buttocks bilaterally.  He has trouble with walking.  He has been better about his sugars.  History of Present Illness: 04/30/2021 [name] is here today with a chief complaint of low back pain and bilateral leg pain along with weakness, numbness and tingling.   He has had progressive problems for the past 2 months with aching and nagging pain as bad as 10 out of 10 when he bends, climbs, walks, lifts, or kneels. He can only walk a short period at this point. Rest and laying down makes it better.  Bowel/Bladder Dysfunction: none  Conservative measures:  Physical therapy: has not participated in Multimodal medical therapy including regular antiinflammatories: tylenol, gabapentin, diclofenac gel Injections: has had epidural steroid injections at [facility] (didn't get any relief)  Past Surgery: denies  Cristian Martin has no symptoms of cervical myelopathy.  The symptoms are causing a significant impact on the patient's life.   Review of Systems:  A 10 point review of systems is negative, except for the pertinent positives and negatives detailed in the HPI.  Past Medical History: Past Medical History:  Diagnosis Date   Agent orange exposure    Coronary artery disease    a. 04/2019 Cath: LM 99, LAD 80p, D2 70, LCX 50ost/p, RCA 70p, EF 55-65%; b. 04/2019 CABG x 3 (LIMA to LAD, SVG to OM1, SVG to RCA).   Diabetic peripheral neuropathy (HCC)    Diastolic dysfunction    a 04/2019 Echo: EF 55-60%, mod conc LVH, Gr1 DD. Nl RV fxn. Mild Ao sclerosis w/o stenosis.   GERD (gastroesophageal reflux disease)     Hyperlipidemia    Hyperplasia of prostate with lower urinary tract symptoms (LUTS)    Hypertension    Prostate cancer [facility]) urologist-- dr ottelin/  oncologist-- dr Tammi Klippel   dx 02-21-2018  -- Stage T1c, Gleason 3+4   Type 2 diabetes mellitus treated with insulin ([place])    followed by [facility] in [place]   Wears dentures    upper    Wears glasses    "wears to help double vision"    Past Surgical History: Past Surgical History:  Procedure Laterality Date   COLONOSCOPY WITH PROPOFOL N/A 08/16/2019   Procedure: COLONOSCOPY WITH PROPOFOL;  Surgeon: Lin Landsman, MD;  Location: [facility] ENDOSCOPY;  Service: Gastroenterology;  Laterality: N/A;   CORONARY ARTERY BYPASS GRAFT N/A 04/30/2019   Procedure: CORONARY ARTERY BYPASS GRAFTING (CABG) TIMES THREE USING LEFT INTERNAL MAMMARY AND RIGHT GREATER SAPHENOUS VEIN HARVESTED ENDOSCOPICALLY. MAMMARY ARTERY TO LAD, SAPHENOUS VEIN GRAFT TO  OM1, SAPHENOUS VEING GRAFT TO RIGHT. BIOPSY OF MEDIASTINAL TISSUE PLACEMENT OF RIGHT FEMORAL A-LINE;  Surgeon: Grace Isaac, MD;  Location: [facility];  Service: Open Heart Surgery;  Laterality: N/A;   CYSTOSCOPY N/A 07/14/2018   Procedure: CYSTOSCOPY;  Surgeon: Kathie Rhodes, MD;  Location: [facility];  Service: Urology;  Laterality: N/A;  no seeds in bladder per Dr Karsten Ro   ESOPHAGOGASTRODUODENOSCOPY (EGD) WITH PROPOFOL N/A 08/16/2019   Procedure: ESOPHAGOGASTRODUODENOSCOPY (EGD) WITH PROPOFOL;  Surgeon: Lin Landsman, MD;  Location: [facility];  Service: Gastroenterology;  Laterality: N/A;  LEFT HEART CATH AND CORONARY ANGIOGRAPHY N/A 04/27/2019   Procedure: LEFT HEART CATH AND CORONARY ANGIOGRAPHY;  Surgeon: Wellington Hampshire, MD;  Location: [facility] [place] [facility];  Service: Cardiovascular;  Laterality: N/A;   PROSTATE BIOPSY  02-21-2018  dr Karsten Ro in office   RADIOACTIVE SEED IMPLANT N/A 07/14/2018   Procedure: RADIOACTIVE SEED IMPLANT/BRACHYTHERAPY IMPLANT;  Surgeon: Kathie Rhodes, MD;   Location: [facility];  Service: Urology;  Laterality: N/A;  77 seeds    SHOULDER ARTHROSCOPY Left 2018   in [place]   "remove spur"   SPACE OAR INSTILLATION N/A 07/14/2018   Procedure: SPACE OAR INSTILLATION;  Surgeon: Kathie Rhodes, MD;  Location: [facility];  Service: Urology;  Laterality: N/A;   TEE WITHOUT CARDIOVERSION N/A 04/30/2019   Procedure: TRANSESOPHAGEAL ECHOCARDIOGRAM (TEE);  Surgeon: Grace Isaac, MD;  Location: [facility];  Service: Open Heart Surgery;  Laterality: N/A;    Allergies: Allergies as of 12/01/2021 - Review Complete 12/01/2021  Allergen Reaction Noted   Ace inhibitors Other (See Comments) 08/01/2013    Medications: Current Meds  Medication Sig   aspirin EC 81 MG tablet Take 1 tablet (81 mg total) by mouth daily. Swallow whole.   carvedilol (COREG) 25 MG tablet Take 25 mg by mouth 2 (two) times daily with a meal.   diclofenac Sodium (VOLTAREN) 1 % GEL Apply topically 4 (four) times daily.   DULoxetine (CYMBALTA) 60 MG capsule Take 60 mg by mouth daily.   famotidine (PEPCID) 20 MG tablet Take 20 mg by mouth 2 (two) times daily.   gabapentin (NEURONTIN) 600 MG tablet Take 1 tablet by mouth at bedtime.   insulin aspart protamine - aspart (NOVOLOG MIX 70/30 FLEXPEN) (70-30) 100 UNIT/ML FlexPen Inject 0.2 mLs (20 Units total) into the skin 2 (two) times daily before a meal. As the patient and I discussed, start with a lower dose of Insulin as he has not been eating well. Titrate Insulin upward to dose taken prior to surgery (60 units bid) as tolerates. (Patient taking differently: Inject 60 Units into the skin 2 (two) times daily before a meal.)   isosorbide mononitrate (IMDUR) 30 MG 24 hr tablet Take 30 mg by mouth daily.   losartan (COZAAR) 100 MG tablet Take 100 mg by mouth daily.   melatonin 3 MG TABS tablet Take 3 mg by mouth at bedtime.   metFORMIN (GLUCOPHAGE-XR) 500 MG 24 hr tablet Take 1,000 mg by mouth in the morning and at  bedtime.   Multiple Vitamins-Minerals (MULTIVITAMIN ADULT PO) Take 1 tablet by mouth daily.    nitroGLYCERIN (NITROSTAT) 0.4 MG SL tablet    omeprazole (PRILOSEC) 20 MG capsule Take 20 mg by mouth 2 (two) times daily.    rosuvastatin (CRESTOR) 40 MG tablet Take 1 tablet (40 mg total) by mouth daily. (Patient taking differently: Take 20 mg by mouth daily.)   Semaglutide (OZEMPIC, 1 MG/DOSE, [place]) Inject 1 mg into the skin every Sunday.     Social History: Social History   Tobacco Use   Smoking status: Never   Smokeless tobacco: Never  Vaping Use   Vaping Use: Never used  Substance Use Topics   Alcohol use: Not Currently   Drug use: Never    Family Medical History: Family History  Problem Relation Age of Onset   Stroke Mother    Diabetes Father    Hypertension Father    Diabetes Sister    Cancer Neg Hx     Physical Examination:  Vitals:   12/01/21 0944  BP: 138/82    General: Patient is well developed, well nourished, calm, collected, and in no apparent distress. Attention to examination is appropriate.  Neck:   Supple.  Full range of motion.  Respiratory: Patient is breathing without any difficulty.   NEUROLOGICAL:     Awake, alert, oriented to person, place, and time.  Speech is clear and fluent. Fund of knowledge is appropriate.   Cranial Nerves: Pupils equal round and reactive to light.  Facial tone is symmetric.  Facial sensation is symmetric. Shoulder shrug is symmetric. Tongue protrusion is midline.  There is no pronator drift.  ROM of spine: full.    Strength: Side Biceps Triceps Deltoid Interossei Grip Wrist Ext. Wrist Flex.  R '5 5 5 5 5 5 5  '$ L '5 5 5 5 5 5 5   '$ Side Iliopsoas Quads Hamstring PF DF EHL  R '5 5 5 5 5 5  '$ L '5 5 5 5 5 5   '$ Reflexes are 1+ and symmetric at the biceps, triceps, brachioradialis, patella and achilles.   Hoffman's is absent.  Clonus is not present.  Toes are down-going.  Bilateral upper and lower extremity sensation is intact to  light touch.    No evidence of dysmetria noted.  Gait is abnormal and requires a cane.     Medical Decision Making  Imaging: MRI L spine 12/16/2020 IMPRESSION:  1. Lumbar spine spondylosis as described above most severe at L4-5.  2. At L4-5 there is a broad-based disc bulge with a broad central  disc protrusion. Mild bilateral facet arthropathy with ligamentum  flavum infolding. Severe spinal stenosis. Severe bilateral foraminal  stenosis.  3. No acute osseous injury of the lumbar spine.   Electronically Signed    By: Kathreen Devoid M.D.    On: 12/17/2020 10:08  MRI L spine 10/26/2021 IMPRESSION: 1. At L4-5 there is a broad-based disc osteophyte complex with a broad small central disc protrusion. Moderate bilateral facet arthropathy. Severe spinal stenosis. Severe left and moderate right foraminal stenosis. 2. At L5-S1 there is a broad-based disc osteophyte complex. Moderate right and mild left facet arthropathy. Prominence of the epidural fat deforming the thecal sac as can be seen with epidural lipomatosis. No spinal stenosis. Moderate bilateral foraminal stenosis. 3. Otherwise, lumbar spine spondylosis as described above. Overall, no significant interval change compared with the prior exam. 4.  No acute osseous injury of the lumbar spine.     Electronically Signed   By: Kathreen Devoid M.D.   On: 10/26/2021 14:42  I have personally reviewed the images and agree with the above interpretation.  Assessment and Plan: Cristian Martin is a pleasant 76 y.o. 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     He is doing somewhat better currently.  For now, we will continue with exercises and physical therapy.  We will see him back in follow-up in December.  I spent a total of 15 minutes in face-to-face and non-face-to-face activities related to this patient's care today.  Thank you for involving me in the care of this patient.      Elester Apodaca K. Izora Ribas MD,  [facility]

## 2021-12-01 NOTE — Therapy (Signed)
OUTPATIENT PHYSICAL THERAPY TREATMENT NOTE   Patient Name: Cristian Martin MRN: [id number] DOB:Aug 16, 1945, 76 y.o., male Today's Date: 12/01/2021 [facility]:  [facility] not [facility]  REFERRING PROVIDER: Dr. Meade Maw  END OF SESSION:   PT End of Session - 12/01/21 1542     Visit Number 4    Number of Visits 20    Date for PT Re-Evaluation 01/27/22    Authorization Time Period 11/18/21-01/27/22    Authorization - Visit Number 4    Authorization - Number of Visits 20    Progress Note Due on Visit 10    PT Start Time 5009    PT Stop Time 1630    PT Time Calculation (min) 45 min    Equipment Utilized During Treatment Gait belt    Activity Tolerance Patient tolerated treatment well    Behavior During Therapy WFL for tasks assessed/performed             Past Medical History:  Diagnosis Date   Agent orange exposure    Coronary artery disease    a. 04/2019 Cath: LM 99, LAD 80p, D2 70, LCX 50ost/p, RCA 70p, EF 55-65%; b. 04/2019 CABG x 3 (LIMA to LAD, SVG to OM1, SVG to RCA).   Diabetic peripheral neuropathy (HCC)    Diastolic dysfunction    a 04/2019 Echo: EF 55-60%, mod conc LVH, Gr1 DD. Nl RV fxn. Mild Ao sclerosis w/o stenosis.   GERD (gastroesophageal reflux disease)    Hyperlipidemia    Hyperplasia of prostate with lower urinary tract symptoms (LUTS)    Hypertension    Prostate cancer [facility]) urologist-- dr ottelin/  oncologist-- dr Tammi Klippel   dx 02-21-2018  -- Stage T1c, Gleason 3+4   Type 2 diabetes mellitus treated with insulin ([place])    followed by [facility] in [place]   Wears dentures    upper    Wears glasses    "wears to help double vision"   Past Surgical History:  Procedure Laterality Date   COLONOSCOPY WITH PROPOFOL N/A 08/16/2019   Procedure: COLONOSCOPY WITH PROPOFOL;  Surgeon: Lin Landsman, MD;  Location: [place];  Service: Gastroenterology;  Laterality: N/A;   CORONARY ARTERY BYPASS GRAFT N/A 04/30/2019   Procedure: CORONARY ARTERY BYPASS GRAFTING  (CABG) TIMES THREE USING LEFT INTERNAL MAMMARY AND RIGHT GREATER SAPHENOUS VEIN HARVESTED ENDOSCOPICALLY. MAMMARY ARTERY TO LAD, SAPHENOUS VEIN GRAFT TO  OM1, SAPHENOUS VEING GRAFT TO RIGHT. BIOPSY OF MEDIASTINAL TISSUE PLACEMENT OF RIGHT FEMORAL A-LINE;  Surgeon: Grace Isaac, MD;  Location: [place];  Service: Open Heart Surgery;  Laterality: N/A;   CYSTOSCOPY N/A 07/14/2018   Procedure: CYSTOSCOPY;  Surgeon: Kathie Rhodes, MD;  Location[facility];  Service: Urology;  Laterality: N/A;  no seeds in bladder per Dr Karsten Ro   ESOPHAGOGASTRODUODENOSCOPY (EGD) WITH PROPOFOL N/A 08/16/2019   Procedure: ESOPHAGOGASTRODUODENOSCOPY (EGD) WITH PROPOFOL;  Surgeon: Lin Landsman, MD;  Location: [facility];  Service: Gastroenterology;  Laterality: N/A;   LEFT HEART CATH AND CORONARY ANGIOGRAPHY N/A 04/27/2019   Procedure: LEFT HEART CATH AND CORONARY ANGIOGRAPHY;  Surgeon: Wellington Hampshire, MD;  Location: [facility];  Service: Cardiovascular;  Laterality: N/A;   PROSTATE BIOPSY  02-21-2018  dr Karsten Ro in office   RADIOACTIVE SEED IMPLANT N/A 07/14/2018   Procedure: RADIOACTIVE SEED IMPLANT/BRACHYTHERAPY IMPLANT;  Surgeon: Kathie Rhodes, MD;  Location: [place];  Service: Urology;  Laterality: N/A;  76 seeds    SHOULDER ARTHROSCOPY Left 2018   in [place]   "  remove spur"   SPACE OAR INSTILLATION N/A 07/14/2018   Procedure: SPACE OAR INSTILLATION;  Surgeon: Kathie Rhodes, MD;  Location: [facility];  Service: Urology;  Laterality: N/A;   TEE WITHOUT CARDIOVERSION N/A 04/30/2019   Procedure: TRANSESOPHAGEAL ECHOCARDIOGRAM (TEE);  Surgeon: Grace Isaac, MD;  Location: [place];  Service: Open Heart Surgery;  Laterality: N/A;   Patient Active Problem List   Diagnosis Date Noted   Paresthesia of both feet 04/09/2021   Spinal stenosis, lumbar region, with neurogenic claudication 03/03/2021   Chronic radicular lumbar pain 03/03/2021   Lumbar  degenerative disc disease 03/03/2021   Lumbar facet arthropathy 03/03/2021   Chronic pain syndrome 03/03/2021   Iron deficiency anemia    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   Coronary artery disease 04/30/2019   Unstable angina ([place]) 04/27/2019   Malignant neoplasm of prostate ([place]) 04/09/2018   GERD (gastroesophageal reflux disease) 04/07/2018   Hx of colonic polyps 04/07/2018   Benign essential hypertension 12/02/2015   Hyperlipidemia, mixed 05/02/2015   Chronic painful diabetic neuropathy ([place]) 03/13/2015   DM (diabetes mellitus) type II controlled with renal manifestation ([place]) 08/08/2013    REFERRING DIAG:  \H20.947 (ICD-10-CM) - Neurogenic claudication due to lumbar spinal stenosis M51.16 (ICD-10-CM) - Lumbar disc herniation with radiculopathy   THERAPY DIAG:  Other low back pain  Difficulty in walking, not elsewhere classified  Rationale for Evaluation and Treatment Rehabilitation  PERTINENT HISTORY: Pt reports experiencing pain in his low back that spreads to his buttocks especially when standing up and walking. He is unable to walk for long periods of time because of back pain and he needs to take a break. He now uses a quad cane because of his balance. He describes a decline in his function after retiring at 76 years old. He to have a heart bypass.   PRECAUTIONS: Falls   SUBJECTIVE:                                                                                                                                                                                      SUBJECTIVE STATEMENT:  Pt reports having a near fall episode while attempting to step onto ramp. He misjudged height of the ramp and almost tripped. Pt describes having double vision but glasses help with this problem.    PAIN:  Are you having pain? No   OBJECTIVE: (objective measures completed at initial evaluation unless otherwise dated)              VITALS: BP 185/79 HR 79    DIAGNOSTIC FINDINGS:  CLINICAL DATA:   Low back pain, pain radiates down both legs. No known  injury.   EXAM: MRI LUMBAR SPINE WITHOUT CONTRAST   TECHNIQUE: Multiplanar, multisequence MR imaging of the lumbar spine was performed. No intravenous contrast was administered.   COMPARISON:  12/16/2020   FINDINGS: Segmentation:  Standard.   Alignment:  Physiologic.   Vertebrae: No acute fracture, evidence of discitis, or aggressive bone lesion.   Conus medullaris and cauda equina: Conus extends to the L1 level. Conus and cauda equina appear normal.   Paraspinal and other soft tissues: No acute paraspinal abnormality.   Disc levels:   Disc spaces: Degenerative disease with disc height loss at L1-2, L2-3, L3-4, L4-5 and L5-S1.   T12-L1: No significant disc bulge. No neural foraminal stenosis. No central canal stenosis.   L1-L2: Mild broad-based disc bulge. Mild spinal stenosis. No foraminal stenosis.   L2-L3: Broad-based disc osteophyte complex flattening the ventral thecal sac. Mild spinal stenosis. No foraminal stenosis.   L3-L4: Broad-based disc osteophyte complex. Mild spinal stenosis. Mild bilateral facet arthropathy. No right foraminal stenosis. Mild left foraminal stenosis.   L4-L5: Broad-based disc osteophyte complex with a broad small central disc protrusion. Moderate bilateral facet arthropathy. Severe spinal stenosis. Severe left and moderate right foraminal stenosis.   L5-S1: Broad-based disc osteophyte complex. Moderate right and mild left facet arthropathy. Prominence of the epidural fat deforming the thecal sac as can be seen with epidural lipomatosis. No spinal stenosis. Moderate bilateral foraminal stenosis.   IMPRESSION: 1. At L4-5 there is a broad-based disc osteophyte complex with a broad small central disc protrusion. Moderate bilateral facet arthropathy. Severe spinal stenosis. Severe left and moderate right foraminal stenosis. 2. At L5-S1 there is a broad-based disc osteophyte  complex. Moderate right and mild left facet arthropathy. Prominence of the epidural fat deforming the thecal sac as can be seen with epidural lipomatosis. No spinal stenosis. Moderate bilateral foraminal stenosis. 3. Otherwise, lumbar spine spondylosis as described above. Overall, no significant interval change compared with the prior exam. 4.  No acute osseous injury of the lumbar spine.     Electronically Signed   By: Kathreen Devoid M.D.   On: 10/26/2021 14:42     PATIENT SURVEYS:  FOTO 54/100   SCREENING FOR RED FLAGS: Bowel or bladder incontinence: No Spinal tumors: No Cauda equina syndrome: No Compression fracture: No Abdominal aneurysm: No   COGNITION:           Overall cognitive status: Within functional limits for tasks assessed                          SENSATION: Neuropathy in feet    MUSCLE LENGTH: Hamstrings: Right 70 deg; Left 70 deg (muscular restriction in hamstrings) Thomas test: Negative bilaterally    POSTURE: No Significant postural limitations   PALPATION: L4-L5 central spinous process    LUMBAR ROM:    AROM eval  Flexion 75%*  Extension 50%*  Right lateral flexion 100%  Left lateral flexion 100%  Right rotation 100%  Left rotation 100%   (Blank rows = not tested)   LOWER EXTREMITY ROM:      Active  Right 11/18/2021 Left 11/18/2021  Hip flexion 120 120  Hip extension 30 30  Hip abduction 45 45  Hip adduction 30 30  Hip internal rotation 45 45  Hip external rotation 45 45  Knee flexion 135 135  Knee extension 0 0  Ankle dorsiflexion 20 20  Ankle plantarflexion 50 50  Ankle inversion      Ankle eversion       (  Blank rows = not tested)        LOWER EXTREMITY MMT:     MMT Right eval Left eval  Hip flexion 5 5  Hip extension 3+ 3+  Hip abduction 5 5  Hip adduction 3+ 3+  Hip internal rotation      Hip external rotation      Knee flexion 5 5  Knee extension 5 5  Ankle dorsiflexion 5 5  Ankle plantarflexion      Ankle  inversion      Ankle eversion       (Blank rows = not tested)   LUMBAR SPECIAL TESTS:  Straight leg raise test: Negative, FABER test: NT, Thomas test: Positive, and FADIR: NT    FUNCTIONAL TESTS:  5 times sit to stand: 22 sec  30 seconds chair stand test 6 minute walk test: 725 ft with quad cane  10 meter walk test: nt Dynamic Gait Index: 14/24   GAIT: Distance walked: 50 ft  Assistive device utilized: Quad cane large base Level of assistance: Modified independence Comments: Decreased step length and stance time       TODAY'S TREATMENT:  [facility] Adult PT Treatment:                                                                                                                             DATE:12/01/21  THEREX   Nu-Step level 5 for seat and arms and resistance at level 3 for resistance for 5 min  Sit to Stand from 18 inch mat height 1 x 10  Sit to Stand from 18 inch mat height 2 x 10 while holding water jug  Abdominal Crunches with arms crossed 3 x 10   NMR : Performed without AD   10 m x 2 walking normal pace  10 m x 6 step over 3 -6 inch obstacles  10 m x 6 right side step alternating with left side step  10 m x 6 backwards walk  -min VC to increase step length stepping backwards                                                                                                                               DATE:11/26/21   THEREX   Falls training  -Sequence demonstration supine to side lying to full kneel to half knee with UE support to full stand  Sit to Stand 1 x  10 from 20 inch mat height   Sit to Stand 2 x 9 from 18 inch mat height    NMR   Figure 8 -10 ft x 30            Amb with horizontal head turns 10 m x 30             Amb with vertical head turns 10 m x 30                                                                                                                                      DATE:11/23/21                          87mT: 725 ft with use of quad  cane                                               -HR 107, SpO2 96%                         5xSTS: 22 sec at 18 inch mat height                          30 sec: 7 reps                             75-77 <11 reps for men indicates risk of falling                          DGI: 14/24                           -Severe impairment for obstacles and changing direction                           -Moderate impairment for steps                            -Mild impairment for head turns                          Sit to Stand with no UE support from 18 inch mat height 3 x 10                           -min VC to decrease eccentric speed and increase concentric speed  DATE:11/18/21 Initial                         Lower Trunk Rotation 2 x 10 with 3 sec hold  Knee to Chest 2 x 10 with 3 sec hold  Prone Quad Stretch 3 x 60 sec      PATIENT EDUCATION:  Education details: form and technique and explanation about deficits  Person educated: Patient Education method: Explanation, Demonstration, Tactile cues, Verbal cues, and Handouts Education comprehension: verbalized understanding, returned demonstration, verbal cues required, and tactile cues required     HOME EXERCISE PROGRAM: Access Code: [email or web address] URL: [email or web address] Date: 12/01/2021 Prepared by: Bradly Chris  Exercises - Supine Lower Trunk Rotation  - 1 x daily - 3 sets - 10 reps - Supine Single Knee to Chest Stretch  - 1 x daily - 2 sets - 10 reps - 3 hold - Prone Quadriceps Stretch with Strap  - 1 x daily - 3 reps - 60 hold - Sit to Stand with Arms Crossed  - 3 x weekly - 3 sets - 10 reps - Figure-8 Walking Around [place]  - 1 x daily - 10 reps - Curl Up with Arms Crossed  - 3 x weekly - 3 sets - 10 reps  ASSESSMENT:   CLINICAL IMPRESSION:  Pt presents for f/u treatment for  chronic low back pain and falls. He shows an improvement in dynamic balance and LE strength with ability to perform sit to stands with increased resistance from a lower mat height and perform step overs with equal stride length. He will continue to benefit from further skilled PT to improve aforementioned deficits to return to walking long distances without taking a rest like shopping for groceries or doing yard work.  OBJECTIVE IMPAIRMENTS: Abnormal gait, decreased balance, decreased endurance, decreased mobility, difficulty walking, decreased ROM, decreased strength, hypomobility, impaired flexibility, impaired sensation, and pain.    ACTIVITY LIMITATIONS: carrying, lifting, bending, standing, squatting, stairs, and locomotion level   PARTICIPATION LIMITATIONS: cleaning, shopping, community activity, and yard work   PERSONAL FACTORS: Age, Fitness, Time since onset of injury/illness/exacerbation, and 3+ comorbidities: h/o of prostate cancer, hypertension, T2DM  are also affecting patient's functional outcome.    REHAB POTENTIAL: Fair severity of symptoms and chronicity of condition    CLINICAL DECISION MAKING: Stable/uncomplicated   EVALUATION COMPLEXITY: Low     GOALS: Goals reviewed with patient? No   SHORT TERM GOALS: Target date: 12/02/2021   Pt will be independent with HEP in order to improve strength and balance in order to decrease fall risk and improve function at home and work.   Baseline: NT Goal status: Ongoing    2.  Pt will decrease 5TSTS by at least 3 seconds in order to demonstrate clinically significant improvement in LE strengt Baseline: 22 sec  Goal status: Ongoing    3.  Pt will increase 6MWT by at least 40m(1646f in order to demonstrate clinically significant improvement in cardiopulmonary endurance and community ambulation Baseline: 725 ft with use of quad cane  Goal status: Ongoing      LONG TERM GOALS: Target date: 01/27/2022   Patient will have improved  function and activity level as evidenced by an increase in FOTO score by 10 points or more.  Baseline: 54/100 with target of 100 Goal status: Ongoing    2.  Patient will perform 5 x STS in  <12 sec to demonstrate improved LE strength and to decrease risk of falling.  Baseline: 22 sec  Goal status: Ongoing    3.  Patient will perform >=11 reps on 30 sec chair stands to demonstrate improve LE endurance and to decrease risk of falling  Baseline: 7 reps  Goal status: Ongoing    4.  Patient will demonstrate reduced falls risk as evidenced by Dynamic Gait Index (DGI) >19/24. Baseline: 14/24 Goal status: Ongoing    5.  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   PLAN FOR NEXT SESSION:  Tandem walk and walking with turns. Continue to progress LE strengthening. Give patient dietary information.   DBradly ChrisPT, DPT  12/01/2021, 3:43 PM

## 2021-12-03 ENCOUNTER — Ambulatory Visit: Payer: PPO | Attending: Neurosurgery | Admitting: Physical Therapy

## 2021-12-03 ENCOUNTER — Encounter: Payer: Self-pay | Admitting: Physical Therapy

## 2021-12-03 DIAGNOSIS — M5459 Other low back pain: Secondary | ICD-10-CM | POA: Insufficient documentation

## 2021-12-03 DIAGNOSIS — R262 Difficulty in walking, not elsewhere classified: Secondary | ICD-10-CM | POA: Insufficient documentation

## 2021-12-03 NOTE — Therapy (Signed)
OUTPATIENT PHYSICAL THERAPY TREATMENT NOTE   Patient Name: Cristian Martin MRN: [id number] DOB:11/19/1945, 76 y.o., male Today's Date: 12/03/2021 [facility]CP:  [facility] not [facility]  REFERRING PROVIDER: Dr. Meade Maw  END OF SESSION:   PT End of Session - 12/03/21 1634     Visit Number 5    Number of Visits 20    Date for PT Re-Evaluation 01/27/22    Authorization Time Period 11/18/21-01/27/22    Authorization - Visit Number 5    Authorization - Number of Visits 20    Progress Note Due on Visit 10    PT Start Time 1630    PT Stop Time 1715    PT Time Calculation (min) 45 min    Equipment Utilized During Treatment Gait belt    Activity Tolerance Patient tolerated treatment well    Behavior During Therapy WFL for tasks assessed/performed             Past Medical History:  Diagnosis Date   Agent orange exposure    Coronary artery disease    a. 04/2019 Cath: LM 99, LAD 80p, D2 70, LCX 50ost/p, RCA 70p, EF 55-65%; b. 04/2019 CABG x 3 (LIMA to LAD, SVG to OM1, SVG to RCA).   Diabetic peripheral neuropathy (HCC)    Diastolic dysfunction    a 04/2019 Echo: EF 55-60%, mod conc LVH, Gr1 DD. Nl RV fxn. Mild Ao sclerosis w/o stenosis.   GERD (gastroesophageal reflux disease)    Hyperlipidemia    Hyperplasia of prostate with lower urinary tract symptoms (LUTS)    Hypertension    Prostate cancer [facility]) urologist-- dr ottelin/  oncologist-- dr Tammi Klippel   dx 02-21-2018  -- Stage T1c, Gleason 3+4   Type 2 diabetes mellitus treated with insulin ([place])    followed by [facility] in [place]   Wears dentures    upper    Wears glasses    "wears to help double vision"   Past Surgical History:  Procedure Laterality Date   COLONOSCOPY WITH PROPOFOL N/A 08/16/2019   Procedure: COLONOSCOPY WITH PROPOFOL;  Surgeon: Lin Landsman, MD;  Location: [place];  Service: Gastroenterology;  Laterality: N/A;   CORONARY ARTERY BYPASS GRAFT N/A 04/30/2019   Procedure: CORONARY ARTERY BYPASS GRAFTING (CABG)  TIMES THREE USING LEFT INTERNAL MAMMARY AND RIGHT GREATER SAPHENOUS VEIN HARVESTED ENDOSCOPICALLY. MAMMARY ARTERY TO LAD, SAPHENOUS VEIN GRAFT TO  OM1, SAPHENOUS VEING GRAFT TO RIGHT. BIOPSY OF MEDIASTINAL TISSUE PLACEMENT OF RIGHT FEMORAL A-LINE;  Surgeon: Grace Isaac, MD;  Location: [place];  Service: Open Heart Surgery;  Laterality: N/A;   CYSTOSCOPY N/A 07/14/2018   Procedure: CYSTOSCOPY;  Surgeon: Kathie Rhodes, MD;  Location: [facility];  Service: Urology;  Laterality: N/A;  no seeds in bladder per Dr Karsten Ro   ESOPHAGOGASTRODUODENOSCOPY (EGD) WITH PROPOFOL N/A 08/16/2019   Procedure: ESOPHAGOGASTRODUODENOSCOPY (EGD) WITH PROPOFOL;  Surgeon: Lin Landsman, MD;  Location: [place];  Service: Gastroenterology;  Laterality: N/A;   LEFT HEART CATH AND CORONARY ANGIOGRAPHY N/A 04/27/2019   Procedure: LEFT HEART CATH AND CORONARY ANGIOGRAPHY;  Surgeon: Wellington Hampshire, MD;  Location: [place] [facility];  Service: Cardiovascular;  Laterality: N/A;   PROSTATE BIOPSY  02-21-2018  dr Karsten Ro in office   RADIOACTIVE SEED IMPLANT N/A 07/14/2018   Procedure: RADIOACTIVE SEED IMPLANT/BRACHYTHERAPY IMPLANT;  Surgeon: Kathie Rhodes, MD;  Location: [place];  Service: Urology;  Laterality: N/A;  76 seeds    SHOULDER ARTHROSCOPY Left 2018   in [place]   "  remove spur"   SPACE OAR INSTILLATION N/A 07/14/2018   Procedure: SPACE OAR INSTILLATION;  Surgeon: Kathie Rhodes, MD;  Location: [facility];  Service: Urology;  Laterality: N/A;   TEE WITHOUT CARDIOVERSION N/A 04/30/2019   Procedure: TRANSESOPHAGEAL ECHOCARDIOGRAM (TEE);  Surgeon: Grace Isaac, MD;  Location: [place];  Service: Open Heart Surgery;  Laterality: N/A;   Patient Active Problem List   Diagnosis Date Noted   Paresthesia of both feet 04/09/2021   Spinal stenosis, lumbar region, with neurogenic claudication 03/03/2021   Chronic radicular lumbar pain 03/03/2021   Lumbar  degenerative disc disease 03/03/2021   Lumbar facet arthropathy 03/03/2021   Chronic pain syndrome 03/03/2021   Iron deficiency anemia    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   Coronary artery disease 04/30/2019   Unstable angina ([place]) 04/27/2019   Malignant neoplasm of prostate ([place]) 04/09/2018   GERD (gastroesophageal reflux disease) 04/07/2018   Hx of colonic polyps 04/07/2018   Benign essential hypertension 12/02/2015   Hyperlipidemia, mixed 05/02/2015   Chronic painful diabetic neuropathy ([place]) 03/13/2015   DM (diabetes mellitus) type II controlled with renal manifestation ([place]) 08/08/2013    REFERRING DIAG:  \Z61.096 (ICD-10-CM) - Neurogenic claudication due to lumbar spinal stenosis M51.16 (ICD-10-CM) - Lumbar disc herniation with radiculopathy   THERAPY DIAG:  Other low back pain  Difficulty in walking, not elsewhere classified  Rationale for Evaluation and Treatment Rehabilitation  PERTINENT HISTORY: Pt reports experiencing pain in his low back that spreads to his buttocks especially when standing up and walking. He is unable to walk for long periods of time because of back pain and he needs to take a break. He now uses a quad cane because of his balance. He describes a decline in his function after retiring at 76 years old. He to have a heart bypass.   PRECAUTIONS: Falls   SUBJECTIVE:                                                                                                                                                                                      SUBJECTIVE STATEMENT:  Pt continues to feel increased muscle soreness because of last session, but otherwise back pain feels fine.    PAIN:  Are you having pain? No   OBJECTIVE: (objective measures completed at initial evaluation unless otherwise dated)              VITALS: BP 185/79 HR 79    DIAGNOSTIC FINDINGS:  CLINICAL DATA:  Low back pain, pain radiates down both legs. No known injury.   EXAM: MRI LUMBAR  SPINE WITHOUT CONTRAST   TECHNIQUE: Multiplanar, multisequence MR imaging  of the lumbar spine was performed. No intravenous contrast was administered.   COMPARISON:  12/16/2020   FINDINGS: Segmentation:  Standard.   Alignment:  Physiologic.   Vertebrae: No acute fracture, evidence of discitis, or aggressive bone lesion.   Conus medullaris and cauda equina: Conus extends to the L1 level. Conus and cauda equina appear normal.   Paraspinal and other soft tissues: No acute paraspinal abnormality.   Disc levels:   Disc spaces: Degenerative disease with disc height loss at L1-2, L2-3, L3-4, L4-5 and L5-S1.   T12-L1: No significant disc bulge. No neural foraminal stenosis. No central canal stenosis.   L1-L2: Mild broad-based disc bulge. Mild spinal stenosis. No foraminal stenosis.   L2-L3: Broad-based disc osteophyte complex flattening the ventral thecal sac. Mild spinal stenosis. No foraminal stenosis.   L3-L4: Broad-based disc osteophyte complex. Mild spinal stenosis. Mild bilateral facet arthropathy. No right foraminal stenosis. Mild left foraminal stenosis.   L4-L5: Broad-based disc osteophyte complex with a broad small central disc protrusion. Moderate bilateral facet arthropathy. Severe spinal stenosis. Severe left and moderate right foraminal stenosis.   L5-S1: Broad-based disc osteophyte complex. Moderate right and mild left facet arthropathy. Prominence of the epidural fat deforming the thecal sac as can be seen with epidural lipomatosis. No spinal stenosis. Moderate bilateral foraminal stenosis.   IMPRESSION: 1. At L4-5 there is a broad-based disc osteophyte complex with a broad small central disc protrusion. Moderate bilateral facet arthropathy. Severe spinal stenosis. Severe left and moderate right foraminal stenosis. 2. At L5-S1 there is a broad-based disc osteophyte complex. Moderate right and mild left facet arthropathy. Prominence of the epidural fat  deforming the thecal sac as can be seen with epidural lipomatosis. No spinal stenosis. Moderate bilateral foraminal stenosis. 3. Otherwise, lumbar spine spondylosis as described above. Overall, no significant interval change compared with the prior exam. 4.  No acute osseous injury of the lumbar spine.     Electronically Signed   By: Kathreen Devoid M.D.   On: 10/26/2021 14:42     PATIENT SURVEYS:  FOTO 54/100   SCREENING FOR RED FLAGS: Bowel or bladder incontinence: No Spinal tumors: No Cauda equina syndrome: No Compression fracture: No Abdominal aneurysm: No   COGNITION:           Overall cognitive status: Within functional limits for tasks assessed                          SENSATION: Neuropathy in feet    MUSCLE LENGTH: Hamstrings: Right 70 deg; Left 70 deg (muscular restriction in hamstrings) Thomas test: Negative bilaterally    POSTURE: No Significant postural limitations   PALPATION: L4-L5 central spinous process    LUMBAR ROM:    AROM eval  Flexion 75%*  Extension 50%*  Right lateral flexion 100%  Left lateral flexion 100%  Right rotation 100%  Left rotation 100%   (Blank rows = not tested)   LOWER EXTREMITY ROM:      Active  Right 11/18/2021 Left 11/18/2021  Hip flexion 120 120  Hip extension 30 30  Hip abduction 45 45  Hip adduction 30 30  Hip internal rotation 45 45  Hip external rotation 45 45  Knee flexion 135 135  Knee extension 0 0  Ankle dorsiflexion 20 20  Ankle plantarflexion 50 50  Ankle inversion      Ankle eversion       (Blank rows = not tested)  LOWER EXTREMITY MMT:     MMT Right eval Left eval  Hip flexion 5 5  Hip extension 3+ 3+  Hip abduction 5 5  Hip adduction 3+ 3+  Hip internal rotation      Hip external rotation      Knee flexion 5 5  Knee extension 5 5  Ankle dorsiflexion 5 5  Ankle plantarflexion      Ankle inversion      Ankle eversion       (Blank rows = not tested)   LUMBAR SPECIAL TESTS:   Straight leg raise test: Negative, FABER test: NT, Thomas test: Positive, and FADIR: NT    FUNCTIONAL TESTS:  5 times sit to stand: 22 sec  30 seconds chair stand test 6 minute walk test: 725 ft with quad cane  10 meter walk test: nt Dynamic Gait Index: 14/24   GAIT: Distance walked: 50 ft  Assistive device utilized: Quad cane large base Level of assistance: Modified independence Comments: Decreased step length and stance time       TODAY'S TREATMENT:  [facility] Adult PT Treatment:                                                                                                                             DATE:12/03/21   Nu-Step level 5 for seat and arms and resistance at level 3 for resistance for 5 min  Sit to Stand 1 x 10 with 2 x 8 lb DB  -Pt reports increased knee pain  OMEGA Leg Extension #65 1 x 10  OMEGA Leg Extension #75 2 x 10  Standing Hip Abduction with BUE support 3 x 10    NMR  10 m fast and slow x 10 reps  10 m 1 ft obstacles x 3 done for 10 reps  10 m 2 ft obstacles x 2 with fast and slow combo for 10 reps  10 m with change in direction turns for 10 reps  10 m side step x 6  10 m side step with yellow TB x 6                                                                                                                            DATE:12/01/21  THEREX   Nu-Step level 5 for seat and arms and resistance at level 3 for resistance for 5 min  Sit to Stand from 18 inch  mat height 1 x 10  Sit to Stand from 18 inch mat height 2 x 10 while holding water jug  Abdominal Crunches with arms crossed 3 x 10   NMR : Performed without AD   10 m x 2 walking normal pace  10 m x 6 step over 3 -6 inch obstacles  10 m x 6 right side step alternating with left side step  10 m x 6 backwards walk  -min VC to increase step length stepping backwards                                                                                                                                DATE:11/26/21   THEREX   Falls training  -Sequence demonstration supine to side lying to full kneel to half knee with UE support to full stand  Sit to Stand 1 x 10 from 20 inch mat height   Sit to Stand 2 x 9 from 18 inch mat height    NMR   Figure 8 -10 ft x 30            Amb with horizontal head turns 10 m x 30             Amb with vertical head turns 10 m x 30                                                                                                                                      DATE:11/23/21                          43mT: 725 ft with use of quad cane                                               -HR 107, SpO2 96%                         5xSTS: 22 sec at 18 inch mat height                          30 sec: 7 reps  75-77 <11 reps for men indicates risk of falling                          DGI: 14/24                           -Severe impairment for obstacles and changing direction                           -Moderate impairment for steps                            -Mild impairment for head turns                          Sit to Stand with no UE support from 18 inch mat height 3 x 10                           -min VC to decrease eccentric speed and increase concentric speed      PATIENT EDUCATION:  Education details: form and technique and explanation about deficits  Person educated: Patient Education method: Explanation, Demonstration, Tactile cues, Verbal cues, and Handouts Education comprehension: verbalized understanding, returned demonstration, verbal cues required, and tactile cues required     HOME EXERCISE PROGRAM: Access Code: [email or web address] URL: [email or web address] Date: 12/03/2021 Prepared by: Bradly Chris  Exercises - Supine Lower Trunk Rotation  - 1 x daily - 3 sets - 10 reps - Supine Single Knee to Chest Stretch  - 1 x daily - 2 sets - 10 reps - 3 hold - Prone Quadriceps Stretch with Strap  - 1 x daily - 3 reps -  60 hold - Sit to Stand with Arms Crossed  - 3 x weekly - 3 sets - 10 reps - Figure-8 Walking Around [place]es  - 1 x daily - 10 reps - Curl Up with Arms Crossed  - 3 x weekly - 3 sets - 10 reps - Standing Hip Abduction with Counter Support  - 3 x weekly - 3 sets - 10 reps ASSESSMENT:   CLINICAL IMPRESSION:  Pt presents for f/u treatment for chronic low back pain and falls. He continues to show improvement with dynamic balance and LE strength with ability to perform walking balance activities with increased speed and difficulty of task and performs sit to stand with increase resistance. Strengthening session limited by pt's knee pain and strengthening exercise modified to include leg press instead. He will continue to benefit from further skilled PT to improve aforementioned deficits to return to walking long distances without taking a rest like shopping for groceries or doing yard work.  OBJECTIVE IMPAIRMENTS: Abnormal gait, decreased balance, decreased endurance, decreased mobility, difficulty walking, decreased ROM, decreased strength, hypomobility, impaired flexibility, impaired sensation, and pain.    ACTIVITY LIMITATIONS: carrying, lifting, bending, standing, squatting, stairs, and locomotion level   PARTICIPATION LIMITATIONS: cleaning, shopping, community activity, and yard work   PERSONAL FACTORS: Age, Fitness, Time since onset of injury/illness/exacerbation, and 3+ comorbidities: h/o of prostate cancer, hypertension, T2DM  are also affecting patient's functional outcome.    REHAB POTENTIAL: Fair severity of symptoms and chronicity of condition    CLINICAL DECISION MAKING: Stable/uncomplicated   EVALUATION COMPLEXITY: Low     GOALS: Goals reviewed with patient?  No   SHORT TERM GOALS: Target date: 12/02/2021   Pt will be independent with HEP in order to improve strength and balance in order to decrease fall risk and improve function at home and work.   Baseline: NT Goal status:  Ongoing    2.  Pt will decrease 5TSTS by at least 3 seconds in order to demonstrate clinically significant improvement in LE strengt Baseline: 22 sec  Goal status: Ongoing    3.  Pt will increase 6MWT by at least 59m(164f in order to demonstrate clinically significant improvement in cardiopulmonary endurance and community ambulation Baseline: 725 ft with use of quad cane  Goal status: Ongoing      LONG TERM GOALS: Target date: 01/27/2022   Patient will have improved function and activity level as evidenced by an increase in FOTO score by 10 points or more.  Baseline: 54/100 with target of 100 Goal status: Ongoing    2.  Patient will perform 5 x STS in  <12 sec to demonstrate improved LE strength and to decrease risk of falling.   Baseline: 22 sec  Goal status: Ongoing    3.  Patient will perform >=11 reps on 30 sec chair stands to demonstrate improve LE endurance and to decrease risk of falling  Baseline: 7 reps  Goal status: Ongoing    4.  Patient will demonstrate reduced falls risk as evidenced by Dynamic Gait Index (DGI) >19/24. Baseline: 14/24 Goal status: Ongoing    5.  Patient will ambulate >=1,000 ft during 20m45mto show improved aerobic endurance and ability to walk distance that signifies he is a comHydrographic surveyoraseline: 725 ft with use of quad cane  Goal status: Ongoing      PLAN: PT FREQUENCY: 1-2x/week   PT DURATION: 10 weeks   PLANNED INTERVENTIONS: Therapeutic exercises, Neuromuscular re-education, Balance training, Gait training, Patient/Family education, Self Care, Joint mobilization, Joint manipulation, Stair training, Vestibular training, DME instructions, Aquatic Therapy, Dry Needling, Electrical stimulation, Spinal manipulation, Spinal mobilization, Cryotherapy, Moist heat, Traction, Manual therapy, and Re-evaluation.   PLAN FOR NEXT SESSION:  Head turns + change in speed + turns combo activity.Tandem walk. Continue to progress LE strengthening.  Give patient dietary information.   DanBradly Chris, DPT  12/03/2021, 4:35 PM

## 2021-12-07 ENCOUNTER — Ambulatory Visit: Payer: PPO

## 2021-12-07 DIAGNOSIS — M5459 Other low back pain: Secondary | ICD-10-CM

## 2021-12-07 DIAGNOSIS — R262 Difficulty in walking, not elsewhere classified: Secondary | ICD-10-CM

## 2021-12-07 NOTE — Therapy (Signed)
OUTPATIENT PHYSICAL THERAPY TREATMENT NOTE   Patient Name: Cristian Martin MRN: [id number] DOB:1945/12/13, 76 y.o., male Today's Date: 12/07/2021 [facility]CP:  [facility] not [facility]  REFERRING PROVIDER: Dr. Meade Maw  END OF SESSION:   PT End of Session - 12/07/21 0850     Visit Number 6    Number of Visits 20    Date for PT Re-Evaluation 01/27/22    Authorization Type [employer]    Authorization Time Period 11/18/21-01/27/22    Authorization - Visit Number 6    Authorization - Number of Visits 20    Progress Note Due on Visit 10    PT Start Time 0845    PT Stop Time 0925    PT Time Calculation (min) 40 min    Equipment Utilized During Treatment Gait belt;Back brace    Activity Tolerance Patient tolerated treatment well;No increased pain    Behavior During Therapy WFL for tasks assessed/performed             Past Medical History:  Diagnosis Date   Agent orange exposure    Coronary artery disease    a. 04/2019 Cath: LM 99, LAD 80p, D2 70, LCX 50ost/p, RCA 70p, EF 55-65%; b. 04/2019 CABG x 3 (LIMA to LAD, SVG to OM1, SVG to RCA).   Diabetic peripheral neuropathy (HCC)    Diastolic dysfunction    a 04/2019 Echo: EF 55-60%, mod conc LVH, Gr1 DD. Nl RV fxn. Mild Ao sclerosis w/o stenosis.   GERD (gastroesophageal reflux disease)    Hyperlipidemia    Hyperplasia of prostate with lower urinary tract symptoms (LUTS)    Hypertension    Prostate cancer [facility]) urologist-- dr ottelin/  oncologist-- dr Tammi Klippel   dx 02-21-2018  -- Stage T1c, Gleason 3+4   Type 2 diabetes mellitus treated with insulin ([place])    followed by [facility] in [place]   Wears dentures    upper    Wears glasses    "wears to help double vision"   Past Surgical History:  Procedure Laterality Date   COLONOSCOPY WITH PROPOFOL N/A 08/16/2019   Procedure: COLONOSCOPY WITH PROPOFOL;  Surgeon: Lin Landsman, MD;  Location: [place];  Service: Gastroenterology;  Laterality: N/A;   CORONARY ARTERY  BYPASS GRAFT N/A 04/30/2019   Procedure: CORONARY ARTERY BYPASS GRAFTING (CABG) TIMES THREE USING LEFT INTERNAL MAMMARY AND RIGHT GREATER SAPHENOUS VEIN HARVESTED ENDOSCOPICALLY. MAMMARY ARTERY TO LAD, SAPHENOUS VEIN GRAFT TO  OM1, SAPHENOUS VEING GRAFT TO RIGHT. BIOPSY OF MEDIASTINAL TISSUE PLACEMENT OF RIGHT FEMORAL A-LINE;  Surgeon: Grace Isaac, MD;  Location: [place];  Service: Open Heart Surgery;  Laterality: N/A;   CYSTOSCOPY N/A 07/14/2018   Procedure: CYSTOSCOPY;  Surgeon: Kathie Rhodes, MD;  Location: [facility];  Service: Urology;  Laterality: N/A;  no seeds in bladder per Dr Karsten Ro   ESOPHAGOGASTRODUODENOSCOPY (EGD) WITH PROPOFOL N/A 08/16/2019   Procedure: ESOPHAGOGASTRODUODENOSCOPY (EGD) WITH PROPOFOL;  Surgeon: Lin Landsman, MD;  Location: [place];  Service: Gastroenterology;  Laterality: N/A;   LEFT HEART CATH AND CORONARY ANGIOGRAPHY N/A 04/27/2019   Procedure: LEFT HEART CATH AND CORONARY ANGIOGRAPHY;  Surgeon: Wellington Hampshire, MD;  Location: [facility];  Service: Cardiovascular;  Laterality: N/A;   PROSTATE BIOPSY  02-21-2018  dr Karsten Ro in office   RADIOACTIVE SEED IMPLANT N/A 07/14/2018   Procedure: RADIOACTIVE SEED IMPLANT/BRACHYTHERAPY IMPLANT;  Surgeon: Kathie Rhodes, MD;  Location: [place];  Service: Urology;  Laterality: N/A;  76 seeds  SHOULDER ARTHROSCOPY Left 2018   in [place]   "remove spur"   SPACE OAR INSTILLATION N/A 07/14/2018   Procedure: SPACE OAR INSTILLATION;  Surgeon: Kathie Rhodes, MD;  Location: [facility];  Service: Urology;  Laterality: N/A;   TEE WITHOUT CARDIOVERSION N/A 04/30/2019   Procedure: TRANSESOPHAGEAL ECHOCARDIOGRAM (TEE);  Surgeon: Grace Isaac, MD;  Location: [place];  Service: Open Heart Surgery;  Laterality: N/A;   Patient Active Problem List   Diagnosis Date Noted   Paresthesia of both feet 04/09/2021   Spinal stenosis, lumbar region, with neurogenic  claudication 03/03/2021   Chronic radicular lumbar pain 03/03/2021   Lumbar degenerative disc disease 03/03/2021   Lumbar facet arthropathy 03/03/2021   Chronic pain syndrome 03/03/2021   Iron deficiency anemia    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   Coronary artery disease 04/30/2019   Unstable angina ([place]) 04/27/2019   Malignant neoplasm of prostate ([place]) 04/09/2018   GERD (gastroesophageal reflux disease) 04/07/2018   Hx of colonic polyps 04/07/2018   Benign essential hypertension 12/02/2015   Hyperlipidemia, mixed 05/02/2015   Chronic painful diabetic neuropathy ([place]) 03/13/2015   DM (diabetes mellitus) type II controlled with renal manifestation ([place]) 08/08/2013    REFERRING DIAG:  \C37.628 (ICD-10-CM) - Neurogenic claudication due to lumbar spinal stenosis M51.16 (ICD-10-CM) - Lumbar disc herniation with radiculopathy   THERAPY DIAG:  Other low back pain  Difficulty in walking, not elsewhere classified  Rationale for Evaluation and Treatment Rehabilitation  PERTINENT HISTORY: Pt reports experiencing pain in his low back that spreads to his buttocks especially when standing up and walking. He is unable to walk for long periods of time because of back pain and he needs to take a break. He now uses a quad cane because of his balance. He describes a decline in his function after retiring at 76 years old. He to have a heart bypass.   PRECAUTIONS: Falls   SUBJECTIVE:                                                                                                                                                                                      SUBJECTIVE STATEMENT: Pt still trying to acquire a camp cot for his floor based exercises, as he cannot get up from floor at home still. No falls or medication changes since last session. MD visit coming up in a few weeks.    PAIN:  Are you having pain? No   OBJECTIVE:   TODAY'S TREATMENT 12/07/21 -AA/ROM Nustep: seat 9, arms 9, Level 5 x 4  minutes   -Overground AMB c WBQC RUE x 862f, ModI level (requires 1 sit rest  break halfway, timer stopped); reports some soreness in bilat glutes after halfway point. 826f in 747ms (0.5444m   -10xSTS hands on knees (slight knee pain, gray chair)  -side stepping laps around elevated plinth, hands free 1x CW, 1x CCW, 1x CW, 1x CCW  -BP assessment: (has not taken meds yet due to no prepping his pills yet) 191/96 mmHg 91 BPM (education on importance of taking BP meds as scheduled and prior to PT sessions)   -airex pad standing chest press with QC x20 -airex stance alternate lateral reach 1x20 -airex stance overhead wall rebound pink physio ball x15 (minGuard assist due to 4 LOB requiring assist)    PATIENT EDUCATION:  Education details: form and technique and explanation about deficits  Person educated: Patient Education method: Explanation, Demonstration, Tactile cues, Verbal cues, and Handouts Education comprehension: verbalized understanding, returned demonstration, verbal cues required, and tactile cues required     HOME EXERCISE PROGRAM: Access Code: [email or web address] URL: [email or web address] Date: 12/03/2021 Prepared by: DanBradly Chrisxercises - Supine Lower Trunk Rotation  - 1 x daily - 3 sets - 10 reps - Supine Single Knee to Chest Stretch  - 1 x daily - 2 sets - 10 reps - 3 hold - Prone Quadriceps Stretch with Strap  - 1 x daily - 3 reps - 60 hold - Sit to Stand with Arms Crossed  - 3 x weekly - 3 sets - 10 reps - Figure-8 Walking Around Cones  - 1 x daily - 10 reps - Curl Up with Arms Crossed  - 3 x weekly - 3 sets - 10 reps - Standing Hip Abduction with Counter Support  - 3 x weekly - 3 sets - 10 reps   ASSESSMENT:   CLINICAL IMPRESSION: Continued with progression sustained aerobic activity, overground walking, and moderate resistance training. BP assessment mid session concerning, discussed with pt, educated on taking meds as scheduled and prior to PT  session. Finished session with static balance activity due to HTN. Pt will benefit from skilled PT to achieve goals of progress.     OBJECTIVE IMPAIRMENTS: Abnormal gait, decreased balance, decreased endurance, decreased mobility, difficulty walking, decreased ROM, decreased strength, hypomobility, impaired flexibility, impaired sensation, and pain.    ACTIVITY LIMITATIONS: carrying, lifting, bending, standing, squatting, stairs, and locomotion level   PARTICIPATION LIMITATIONS: cleaning, shopping, community activity, and yard work   PERSONAL FACTORS: Age, Fitness, Time since onset of injury/illness/exacerbation, and 3+ comorbidities: h/o of prostate cancer, hypertension, T2DM  are also affecting patient's functional outcome.    REHAB POTENTIAL: Fair severity of symptoms and chronicity of condition    CLINICAL DECISION MAKING: Stable/uncomplicated   EVALUATION COMPLEXITY: Low     GOALS: Goals reviewed with patient? No   SHORT TERM GOALS: Target date: 12/02/2021   Pt will be independent with HEP in order to improve strength and balance in order to decrease fall risk and improve function at home and work.   Baseline: NT Goal status: Ongoing    2.  Pt will decrease 5TSTS by at least 3 seconds in order to demonstrate clinically significant improvement in LE strengt Baseline: 22 sec  Goal status: Ongoing    3.  Pt will increase 6MWT by at least [id number] order to demonstrate clinically significant improvement in cardiopulmonary endurance and community ambulation Baseline: 725 ft with use of quad cane  Goal status: Ongoing      LONG TERM GOALS: Target date: 01/27/2022   Patient will have improved function and activity level  as evidenced by an increase in FOTO score by 10 points or more.  Baseline: 54/100 with target of 100 Goal status: Ongoing    2.  Patient will perform 5 x STS in  <12 sec to demonstrate improved LE strength and to decrease risk of falling.   Baseline: 22  sec  Goal status: Ongoing    3.  Patient will perform >=11 reps on 30 sec chair stands to demonstrate improve LE endurance and to decrease risk of falling  Baseline: 7 reps  Goal status: Ongoing    4.  Patient will demonstrate reduced falls risk as evidenced by Dynamic Gait Index (DGI) >19/24. Baseline: 14/24 Goal status: Ongoing    5.  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   PLAN FOR NEXT SESSION:  Head turns + change in speed + turns combo activity.Tandem walk. Continue to progress LE strengthening. Give patient dietary information.     8:54 AM, 12/07/21 AEtta Grandchild PT, DPT Physical Therapist - [phone](Office)  12/07/2021, 8:53 AM

## 2021-12-09 ENCOUNTER — Ambulatory Visit: Payer: PPO | Admitting: Physical Therapy

## 2021-12-09 ENCOUNTER — Encounter: Payer: Self-pay | Admitting: Physical Therapy

## 2021-12-09 DIAGNOSIS — R262 Difficulty in walking, not elsewhere classified: Secondary | ICD-10-CM

## 2021-12-09 DIAGNOSIS — M5459 Other low back pain: Secondary | ICD-10-CM | POA: Diagnosis not present

## 2021-12-09 NOTE — Therapy (Signed)
OUTPATIENT PHYSICAL THERAPY TREATMENT NOTE   Patient Name: Cristian Martin MRN: [id number] DOB:14-Feb-1945, 76 y.o., male Today's Date: 12/09/2021 [facility]CP:  [facility] not [facility]  REFERRING PROVIDER: Dr. Meade Maw  END OF SESSION:   PT End of Session - 12/09/21 0855     Visit Number 7    Number of Visits 20    Date for PT Re-Evaluation 01/27/22    Authorization Type [employer]    Authorization Time Period 11/18/21-01/27/22    Authorization - Visit Number 7    Authorization - Number of Visits 20    Progress Note Due on Visit 10    PT Start Time 0845    PT Stop Time 0930    PT Time Calculation (min) 45 min    Equipment Utilized During Treatment Gait belt;Back brace    Activity Tolerance Patient tolerated treatment well;No increased pain    Behavior During Therapy WFL for tasks assessed/performed             Past Medical History:  Diagnosis Date   Agent orange exposure    Coronary artery disease    a. 04/2019 Cath: LM 99, LAD 80p, D2 70, LCX 50ost/p, RCA 70p, EF 55-65%; b. 04/2019 CABG x 3 (LIMA to LAD, SVG to OM1, SVG to RCA).   Diabetic peripheral neuropathy (HCC)    Diastolic dysfunction    a 04/2019 Echo: EF 55-60%, mod conc LVH, Gr1 DD. Nl RV fxn. Mild Ao sclerosis w/o stenosis.   GERD (gastroesophageal reflux disease)    Hyperlipidemia    Hyperplasia of prostate with lower urinary tract symptoms (LUTS)    Hypertension    Prostate cancer [facility]) urologist-- dr ottelin/  oncologist-- dr Tammi Klippel   dx 02-21-2018  -- Stage T1c, Gleason 3+4   Type 2 diabetes mellitus treated with insulin ([place])    followed by [facility] in [place]   Wears dentures    upper    Wears glasses    "wears to help double vision"   Past Surgical History:  Procedure Laterality Date   COLONOSCOPY WITH PROPOFOL N/A 08/16/2019   Procedure: COLONOSCOPY WITH PROPOFOL;  Surgeon: Lin Landsman, MD;  Location: [place];  Service: Gastroenterology;  Laterality: N/A;   CORONARY ARTERY  BYPASS GRAFT N/A 04/30/2019   Procedure: CORONARY ARTERY BYPASS GRAFTING (CABG) TIMES THREE USING LEFT INTERNAL MAMMARY AND RIGHT GREATER SAPHENOUS VEIN HARVESTED ENDOSCOPICALLY. MAMMARY ARTERY TO LAD, SAPHENOUS VEIN GRAFT TO  OM1, SAPHENOUS VEING GRAFT TO RIGHT. BIOPSY OF MEDIASTINAL TISSUE PLACEMENT OF RIGHT FEMORAL A-LINE;  Surgeon: Grace Isaac, MD;  Location: [place];  Service: Open Heart Surgery;  Laterality: N/A;   CYSTOSCOPY N/A 07/14/2018   Procedure: CYSTOSCOPY;  Surgeon: Kathie Rhodes, MD;  Location: [facility];  Service: Urology;  Laterality: N/A;  no seeds in bladder per Dr Karsten Ro   ESOPHAGOGASTRODUODENOSCOPY (EGD) WITH PROPOFOL N/A 08/16/2019   Procedure: ESOPHAGOGASTRODUODENOSCOPY (EGD) WITH PROPOFOL;  Surgeon: Lin Landsman, MD;  Location: [place];  Service: Gastroenterology;  Laterality: N/A;   LEFT HEART CATH AND CORONARY ANGIOGRAPHY N/A 04/27/2019   Procedure: LEFT HEART CATH AND CORONARY ANGIOGRAPHY;  Surgeon: Wellington Hampshire, MD;  Location: [place] [facility];  Service: Cardiovascular;  Laterality: N/A;   PROSTATE BIOPSY  02-21-2018  dr Karsten Ro in office   RADIOACTIVE SEED IMPLANT N/A 07/14/2018   Procedure: RADIOACTIVE SEED IMPLANT/BRACHYTHERAPY IMPLANT;  Surgeon: Kathie Rhodes, MD;  Location: [place];  Service: Urology;  Laterality: N/A;  76 seeds  SHOULDER ARTHROSCOPY Left 2018   in [place]   "remove spur"   SPACE OAR INSTILLATION N/A 07/14/2018   Procedure: SPACE OAR INSTILLATION;  Surgeon: Kathie Rhodes, MD;  Location: [facility];  Service: Urology;  Laterality: N/A;   TEE WITHOUT CARDIOVERSION N/A 04/30/2019   Procedure: TRANSESOPHAGEAL ECHOCARDIOGRAM (TEE);  Surgeon: Grace Isaac, MD;  Location: [place];  Service: Open Heart Surgery;  Laterality: N/A;   Patient Active Problem List   Diagnosis Date Noted   Paresthesia of both feet 04/09/2021   Spinal stenosis, lumbar region, with neurogenic  claudication 03/03/2021   Chronic radicular lumbar pain 03/03/2021   Lumbar degenerative disc disease 03/03/2021   Lumbar facet arthropathy 03/03/2021   Chronic pain syndrome 03/03/2021   Iron deficiency anemia    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   Coronary artery disease 04/30/2019   Unstable angina ([place]) 04/27/2019   Malignant neoplasm of prostate ([place]) 04/09/2018   GERD (gastroesophageal reflux disease) 04/07/2018   Hx of colonic polyps 04/07/2018   Benign essential hypertension 12/02/2015   Hyperlipidemia, mixed 05/02/2015   Chronic painful diabetic neuropathy ([place]) 03/13/2015   DM (diabetes mellitus) type II controlled with renal manifestation ([place]) 08/08/2013    REFERRING DIAG:  \W54.627 (ICD-10-CM) - Neurogenic claudication due to lumbar spinal stenosis M51.16 (ICD-10-CM) - Lumbar disc herniation with radiculopathy   THERAPY DIAG:  Other low back pain  Difficulty in walking, not elsewhere classified  Rationale for Evaluation and Treatment Rehabilitation  PERTINENT HISTORY: Pt reports experiencing pain in his low back that spreads to his buttocks especially when standing up and walking. He is unable to walk for long periods of time because of back pain and he needs to take a break. He now uses a quad cane because of his balance. He describes a decline in his function after retiring at 76 years old. He to have a heart bypass.   PRECAUTIONS: Falls   SUBJECTIVE:                                                                                                                                                                                      SUBJECTIVE STATEMENT:  Pt continues to feel increased muscle soreness because of last session, but otherwise back pain feels fine.    PAIN:  Are you having pain? No   OBJECTIVE: (objective measures completed at initial evaluation unless otherwise dated)              VITALS: BP 185/79 HR 79    DIAGNOSTIC FINDINGS:  CLINICAL DATA:  Low back  pain, pain radiates down both legs. No known injury.   EXAM: MRI LUMBAR  SPINE WITHOUT CONTRAST   TECHNIQUE: Multiplanar, multisequence MR imaging of the lumbar spine was performed. No intravenous contrast was administered.   COMPARISON:  12/16/2020   FINDINGS: Segmentation:  Standard.   Alignment:  Physiologic.   Vertebrae: No acute fracture, evidence of discitis, or aggressive bone lesion.   Conus medullaris and cauda equina: Conus extends to the L1 level. Conus and cauda equina appear normal.   Paraspinal and other soft tissues: No acute paraspinal abnormality.   Disc levels:   Disc spaces: Degenerative disease with disc height loss at L1-2, L2-3, L3-4, L4-5 and L5-S1.   T12-L1: No significant disc bulge. No neural foraminal stenosis. No central canal stenosis.   L1-L2: Mild broad-based disc bulge. Mild spinal stenosis. No foraminal stenosis.   L2-L3: Broad-based disc osteophyte complex flattening the ventral thecal sac. Mild spinal stenosis. No foraminal stenosis.   L3-L4: Broad-based disc osteophyte complex. Mild spinal stenosis. Mild bilateral facet arthropathy. No right foraminal stenosis. Mild left foraminal stenosis.   L4-L5: Broad-based disc osteophyte complex with a broad small central disc protrusion. Moderate bilateral facet arthropathy. Severe spinal stenosis. Severe left and moderate right foraminal stenosis.   L5-S1: Broad-based disc osteophyte complex. Moderate right and mild left facet arthropathy. Prominence of the epidural fat deforming the thecal sac as can be seen with epidural lipomatosis. No spinal stenosis. Moderate bilateral foraminal stenosis.   IMPRESSION: 1. At L4-5 there is a broad-based disc osteophyte complex with a broad small central disc protrusion. Moderate bilateral facet arthropathy. Severe spinal stenosis. Severe left and moderate right foraminal stenosis. 2. At L5-S1 there is a broad-based disc osteophyte complex.  Moderate right and mild left facet arthropathy. Prominence of the epidural fat deforming the thecal sac as can be seen with epidural lipomatosis. No spinal stenosis. Moderate bilateral foraminal stenosis. 3. Otherwise, lumbar spine spondylosis as described above. Overall, no significant interval change compared with the prior exam. 4.  No acute osseous injury of the lumbar spine.     Electronically Signed   By: Kathreen Devoid M.D.   On: 10/26/2021 14:42     PATIENT SURVEYS:  FOTO 54/100   SCREENING FOR RED FLAGS: Bowel or bladder incontinence: No Spinal tumors: No Cauda equina syndrome: No Compression fracture: No Abdominal aneurysm: No   COGNITION:           Overall cognitive status: Within functional limits for tasks assessed                          SENSATION: Neuropathy in feet    MUSCLE LENGTH: Hamstrings: Right 70 deg; Left 70 deg (muscular restriction in hamstrings) Thomas test: Negative bilaterally    POSTURE: No Significant postural limitations   PALPATION: L4-L5 central spinous process    LUMBAR ROM:    AROM eval  Flexion 75%*  Extension 50%*  Right lateral flexion 100%  Left lateral flexion 100%  Right rotation 100%  Left rotation 100%   (Blank rows = not tested)   LOWER EXTREMITY ROM:      Active  Right 11/18/2021 Left 11/18/2021  Hip flexion 120 120  Hip extension 30 30  Hip abduction 45 45  Hip adduction 30 30  Hip internal rotation 45 45  Hip external rotation 45 45  Knee flexion 135 135  Knee extension 0 0  Ankle dorsiflexion 20 20  Ankle plantarflexion 50 50  Ankle inversion      Ankle eversion       (Blank  rows = not tested)        LOWER EXTREMITY MMT:     MMT Right eval Left eval  Hip flexion 5 5  Hip extension 3+ 3+  Hip abduction 5 5  Hip adduction 3+ 3+  Hip internal rotation      Hip external rotation      Knee flexion 5 5  Knee extension 5 5  Ankle dorsiflexion 5 5  Ankle plantarflexion      Ankle inversion       Ankle eversion       (Blank rows = not tested)   LUMBAR SPECIAL TESTS:  Straight leg raise test: Negative, FABER test: NT, Thomas test: Positive, and FADIR: NT    FUNCTIONAL TESTS:  5 times sit to stand: 22 sec  30 seconds chair stand test 6 minute walk test: 725 ft with quad cane  10 meter walk test: nt Dynamic Gait Index: 14/24   GAIT: Distance walked: 50 ft  Assistive device utilized: Quad cane large base Level of assistance: Modified independence Comments: Decreased step length and stance time       TODAY'S TREATMENT:  [facility] Adult PT Treatment:                                                                                                                             DATE:12/09/21  AA/ROM Nustep: seat 7, arms 7, Level 3 x 5 minutes VITALS: BP 184/84, no symptoms  Overground Walking 5 laps without use AD  VITALS: BP 183/88  5xSTS: 12 SEC  30 sec chair stands: 10 reps   Ongoing education about taking BP and following up with PCP                                                                                                                            DATE:12/07/21  -AA/ROM Nustep: seat 9, arms 9, Level 5 x 4 minutes    -Overground AMB c WBQC RUE x 861f, ModI level (requires 1 sit rest break halfway, timer stopped); reports some soreness in bilat glutes after halfway point. 8021fin 67m60m (0.9m41m   -10xSTS hands on knees (slight knee pain, gray chair)  -side stepping laps around elevated plinth, hands free 1x CW, 1x CCW, 1x CW, 1x CCW   -BP assessment: (has not taken meds yet due to no prepping his pills yet) 191/96 mmHg 91 BPM (education on importance of  taking BP meds as scheduled and prior to PT sessions)    -airex pad standing chest press with QC x20 -airex stance alternate lateral reach 1x20 -airex stance overhead wall rebound pink physio ball x15 (minGuard assist due to 4 LOB requiring assist)                                                                                                                              DATE:12/03/21   Nu-Step level 5 for seat and arms and resistance at level 3 for resistance for 5 min  Sit to Stand 1 x 10 with 2 x 8 lb DB  -Pt reports increased knee pain  OMEGA Leg Extension #65 1 x 10  OMEGA Leg Extension #75 2 x 10  Standing Hip Abduction with BUE support 3 x 10    NMR  10 m fast and slow x 10 reps  10 m 1 ft obstacles x 3 done for 10 reps  10 m 2 ft obstacles x 2 with fast and slow combo for 10 reps  10 m with change in direction turns for 10 reps  10 m side step x 6  10 m side step with yellow TB x 6                                                                                                                            DATE:12/01/21  THEREX   Nu-Step level 5 for seat and arms and resistance at level 3 for resistance for 5 min  Sit to Stand from 18 inch mat height 1 x 10  Sit to Stand from 18 inch mat height 2 x 10 while holding water jug  Abdominal Crunches with arms crossed 3 x 10   NMR : Performed without AD   10 m x 2 walking normal pace  10 m x 6 step over 3 -6 inch obstacles  10 m x 6 right side step alternating with left side step  10 m x 6 backwards walk  -min VC to increase step length stepping backwards  DATE:11/26/21   THEREX   Falls training  -Sequence demonstration supine to side lying to full kneel to half knee with UE support to full stand  Sit to Stand 1 x 10 from 20 inch mat height   Sit to Stand 2 x 9 from 18 inch mat height    NMR   Figure 8 -10 ft x 30            Amb with horizontal head turns 10 m x 30             Amb with vertical head turns 10 m x 30                                                                                                                                      DATE:11/23/21                          31mT: 725 ft with use  of quad cane                                               -HR 107, SpO2 96%                         5xSTS: 22 sec at 18 inch mat height                          30 sec: 7 reps                             75-77 <11 reps for men indicates risk of falling                          DGI: 14/24                           -Severe impairment for obstacles and changing direction                           -Moderate impairment for steps                            -Mild impairment for head turns                          Sit to Stand with no UE support from 18 inch mat height 3 x 10                           -  min VC to decrease eccentric speed and increase concentric speed      PATIENT EDUCATION:  Education details: form and technique and explanation about deficits  Person educated: Patient Education method: Explanation, Demonstration, Tactile cues, Verbal cues, and Handouts Education comprehension: verbalized understanding, returned demonstration, verbal cues required, and tactile cues required     HOME EXERCISE PROGRAM: Access Code: [email or web address] URL: [email or web address] Date: 12/09/2021 Prepared by: Bradly Chris  Exercises - Supine Lower Trunk Rotation  - 1 x daily - 3 sets - 10 reps - Supine Single Knee to Chest Stretch  - 1 x daily - 2 sets - 10 reps - 3 hold - Prone Quadriceps Stretch with Strap  - 1 x daily - 3 reps - 60 hold - Sit to Stand with Arms Crossed  - 3 x weekly - 3 sets - 15 reps - Figure-8 Walking Around [place]es  - 1 x daily - 10 reps - Curl Up with Arms Crossed  - 3 x weekly - 3 sets - 10 reps - Standing Hip Abduction with Counter Support  - 3 x weekly - 3 sets - 10 reps  ASSESSMENT:   CLINICAL IMPRESSION:  Pt presents for f/u treatment for chronic low back pain and falls. He shows an improvement in LE strength and endurance. He was limited during session because of hypertension. PT educated pt on taking BP several times throughout the time and to provide PCP  contact next session. His BP was elevated but it did not increase over the session. He will continue to benefit from further skilled PT to improve aforementioned deficits to return to walking long distances without taking a rest like shopping for groceries or doing yard work.  OBJECTIVE IMPAIRMENTS: Abnormal gait, decreased balance, decreased endurance, decreased mobility, difficulty walking, decreased ROM, decreased strength, hypomobility, impaired flexibility, impaired sensation, and pain.    ACTIVITY LIMITATIONS: carrying, lifting, bending, standing, squatting, stairs, and locomotion level   PARTICIPATION LIMITATIONS: cleaning, shopping, community activity, and yard work   PERSONAL FACTORS: Age, Fitness, Time since onset of injury/illness/exacerbation, and 3+ comorbidities: h/o of prostate cancer, hypertension, T2DM  are also affecting patient's functional outcome.    REHAB POTENTIAL: Fair severity of symptoms and chronicity of condition    CLINICAL DECISION MAKING: Stable/uncomplicated   EVALUATION COMPLEXITY: Low     GOALS: Goals reviewed with patient? No   SHORT TERM GOALS: Target date: 12/02/2021   Pt will be independent with HEP in order to improve strength and balance in order to decrease fall risk and improve function at home and work.   Baseline: Completing independently  Goal status: Ongoing    2.  Pt will decrease 5TSTS by at least 3 seconds in order to demonstrate clinically significant improvement in LE strengt Baseline: 22 sec   12/09/21: 12 sec  Goal status: Achieved    3.  Pt will increase 6MWT by at least 39m(166f in order to demonstrate clinically significant improvement in cardiopulmonary endurance and community ambulation Baseline: 725 ft with use of quad cane  Goal status: Ongoing      LONG TERM GOALS: Target date: 01/27/2022   Patient will have improved function and activity level as evidenced by an increase in FOTO score by 10 points or more.   Baseline: 54/100 with target of 100 Goal status: Ongoing    2.  Patient will perform 5 x STS in  <12 sec to demonstrate improved LE strength and to decrease risk of falling.   Baseline: 22 sec  12/09/21: 12 sec  Goal status: Partially Met    3.  Patient will perform >=11 reps on 30 sec chair stands to demonstrate improve LE endurance and to decrease risk of falling  Baseline: 7 reps   12/09/21: 10 reps  Goal status: Partially Met    4.  Patient will demonstrate reduced falls risk as evidenced by Dynamic Gait Index (DGI) >19/24. Baseline: 14/24 Goal status: Ongoing    5.  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   PLAN FOR NEXT SESSION: Take FOTO and do DGI. Head turns + change in speed + turns combo activity.Tandem walk. Continue to progress LE strengthening. Give patient dietary information.   DBradly ChrisPT, DPT  12/09/2021, 10:35 AM

## 2021-12-14 ENCOUNTER — Encounter: Payer: Self-pay | Admitting: Physical Therapy

## 2021-12-14 ENCOUNTER — Ambulatory Visit: Payer: PPO | Admitting: Physical Therapy

## 2021-12-14 DIAGNOSIS — M5459 Other low back pain: Secondary | ICD-10-CM | POA: Diagnosis not present

## 2021-12-14 DIAGNOSIS — R262 Difficulty in walking, not elsewhere classified: Secondary | ICD-10-CM

## 2021-12-14 NOTE — Therapy (Signed)
OUTPATIENT PHYSICAL THERAPY TREATMENT NOTE   Patient Name: Cristian Martin MRN: [id number] DOB:08/09/1945, 76 y.o., male Today's Date: 12/14/2021 [facility]CP:  [facility] not [facility]  REFERRING PROVIDER: Dr. Meade Maw  END OF SESSION:   PT End of Session - 12/14/21 0854     Visit Number 8    Number of Visits 20    Date for PT Re-Evaluation 01/27/22    Authorization Type [employer]    Authorization Time Period 11/18/21-01/27/22    Authorization - Visit Number 8    Authorization - Number of Visits 20    Progress Note Due on Visit 10    PT Start Time 0850    PT Stop Time 0930    PT Time Calculation (min) 40 min    Equipment Utilized During Treatment Gait belt;Back brace    Activity Tolerance Patient tolerated treatment well;Patient limited by pain    Behavior During Therapy WFL for tasks assessed/performed             Past Medical History:  Diagnosis Date   Agent orange exposure    Coronary artery disease    a. 04/2019 Cath: LM 99, LAD 80p, D2 70, LCX 50ost/p, RCA 70p, EF 55-65%; b. 04/2019 CABG x 3 (LIMA to LAD, SVG to OM1, SVG to RCA).   Diabetic peripheral neuropathy (HCC)    Diastolic dysfunction    a 04/2019 Echo: EF 55-60%, mod conc LVH, Gr1 DD. Nl RV fxn. Mild Ao sclerosis w/o stenosis.   GERD (gastroesophageal reflux disease)    Hyperlipidemia    Hyperplasia of prostate with lower urinary tract symptoms (LUTS)    Hypertension    Prostate cancer [facility]) urologist-- dr ottelin/  oncologist-- dr Tammi Klippel   dx 02-21-2018  -- Stage T1c, Gleason 3+4   Type 2 diabetes mellitus treated with insulin ([place])    followed by [facility] in [place]   Wears dentures    upper    Wears glasses    "wears to help double vision"   Past Surgical History:  Procedure Laterality Date   COLONOSCOPY WITH PROPOFOL N/A 08/16/2019   Procedure: COLONOSCOPY WITH PROPOFOL;  Surgeon: Lin Landsman, MD;  Location: [place];  Service: Gastroenterology;  Laterality: N/A;   CORONARY  ARTERY BYPASS GRAFT N/A 04/30/2019   Procedure: CORONARY ARTERY BYPASS GRAFTING (CABG) TIMES THREE USING LEFT INTERNAL MAMMARY AND RIGHT GREATER SAPHENOUS VEIN HARVESTED ENDOSCOPICALLY. MAMMARY ARTERY TO LAD, SAPHENOUS VEIN GRAFT TO  OM1, SAPHENOUS VEING GRAFT TO RIGHT. BIOPSY OF MEDIASTINAL TISSUE PLACEMENT OF RIGHT FEMORAL A-LINE;  Surgeon: Grace Isaac, MD;  Location: [place];  Service: Open Heart Surgery;  Laterality: N/A;   CYSTOSCOPY N/A 07/14/2018   Procedure: CYSTOSCOPY;  Surgeon: Kathie Rhodes, MD;  Location: [facility];  Service: Urology;  Laterality: N/A;  no seeds in bladder per Dr Karsten Ro   ESOPHAGOGASTRODUODENOSCOPY (EGD) WITH PROPOFOL N/A 08/16/2019   Procedure: ESOPHAGOGASTRODUODENOSCOPY (EGD) WITH PROPOFOL;  Surgeon: Lin Landsman, MD;  Location: [place];  Service: Gastroenterology;  Laterality: N/A;   LEFT HEART CATH AND CORONARY ANGIOGRAPHY N/A 04/27/2019   Procedure: LEFT HEART CATH AND CORONARY ANGIOGRAPHY;  Surgeon: Wellington Hampshire, MD;  Location: [place] [facility];  Service: Cardiovascular;  Laterality: N/A;   PROSTATE BIOPSY  02-21-2018  dr Karsten Ro in office   RADIOACTIVE SEED IMPLANT N/A 07/14/2018   Procedure: RADIOACTIVE SEED IMPLANT/BRACHYTHERAPY IMPLANT;  Surgeon: Kathie Rhodes, MD;  Location: [place];  Service: Urology;  Laterality: N/A;  68  seeds    SHOULDER ARTHROSCOPY Left 2018   in [place]   "remove spur"   SPACE OAR INSTILLATION N/A 07/14/2018   Procedure: SPACE OAR INSTILLATION;  Surgeon: Kathie Rhodes, MD;  [facility];  Service: Urology;  Laterality: N/A;   TEE WITHOUT CARDIOVERSION N/A 04/30/2019   Procedure: TRANSESOPHAGEAL ECHOCARDIOGRAM (TEE);  Surgeon: Grace Isaac, MD;  Location: [place];  Service: Open Heart Surgery;  Laterality: N/A;   Patient Active Problem List   Diagnosis Date Noted   Paresthesia of both feet 04/09/2021   Spinal stenosis, lumbar region, with neurogenic  claudication 03/03/2021   Chronic radicular lumbar pain 03/03/2021   Lumbar degenerative disc disease 03/03/2021   Lumbar facet arthropathy 03/03/2021   Chronic pain syndrome 03/03/2021   Iron deficiency anemia    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   Coronary artery disease 04/30/2019   Unstable angina ([place]) 04/27/2019   Malignant neoplasm of prostate ([place]) 04/09/2018   GERD (gastroesophageal reflux disease) 04/07/2018   Hx of colonic polyps 04/07/2018   Benign essential hypertension 12/02/2015   Hyperlipidemia, mixed 05/02/2015   Chronic painful diabetic neuropathy ([place]) 03/13/2015   DM (diabetes mellitus) type II controlled with renal manifestation ([place]) 08/08/2013    REFERRING DIAG:  \W29.937 (ICD-10-CM) - Neurogenic claudication due to lumbar spinal stenosis M51.16 (ICD-10-CM) - Lumbar disc herniation with radiculopathy   THERAPY DIAG:  Other low back pain  Difficulty in walking, not elsewhere classified  Rationale for Evaluation and Treatment Rehabilitation  PERTINENT HISTORY: Pt reports experiencing pain in his low back that spreads to his buttocks especially when standing up and walking. He is unable to walk for long periods of time because of back pain and he needs to take a break. He now uses a quad cane because of his balance. He describes a decline in his function after retiring at 76 years old. He to have a heart bypass.   PRECAUTIONS: Falls   SUBJECTIVE:                                                                                                                                                                                      SUBJECTIVE STATEMENT:  Pt reports increased pain in low back over past couple of days and he is thinking of getting back surgery to help with the pain.    PAIN:  Are you having pain? Yes: NPRS scale: 3/10 Pain location: L4 CPA  Pain description: Achy  Aggravating factors: Waking up in morning  Relieving factors: Wearing back brace     OBJECTIVE: (objective measures completed at initial evaluation unless otherwise dated)  VITALS: BP 185/79 HR 79    DIAGNOSTIC FINDINGS:  CLINICAL DATA:  Low back pain, pain radiates down both legs. No known injury.   EXAM: MRI LUMBAR SPINE WITHOUT CONTRAST   TECHNIQUE: Multiplanar, multisequence MR imaging of the lumbar spine was performed. No intravenous contrast was administered.   COMPARISON:  12/16/2020   FINDINGS: Segmentation:  Standard.   Alignment:  Physiologic.   Vertebrae: No acute fracture, evidence of discitis, or aggressive bone lesion.   Conus medullaris and cauda equina: Conus extends to the L1 level. Conus and cauda equina appear normal.   Paraspinal and other soft tissues: No acute paraspinal abnormality.   Disc levels:   Disc spaces: Degenerative disease with disc height loss at L1-2, L2-3, L3-4, L4-5 and L5-S1.   T12-L1: No significant disc bulge. No neural foraminal stenosis. No central canal stenosis.   L1-L2: Mild broad-based disc bulge. Mild spinal stenosis. No foraminal stenosis.   L2-L3: Broad-based disc osteophyte complex flattening the ventral thecal sac. Mild spinal stenosis. No foraminal stenosis.   L3-L4: Broad-based disc osteophyte complex. Mild spinal stenosis. Mild bilateral facet arthropathy. No right foraminal stenosis. Mild left foraminal stenosis.   L4-L5: Broad-based disc osteophyte complex with a broad small central disc protrusion. Moderate bilateral facet arthropathy. Severe spinal stenosis. Severe left and moderate right foraminal stenosis.   L5-S1: Broad-based disc osteophyte complex. Moderate right and mild left facet arthropathy. Prominence of the epidural fat deforming the thecal sac as can be seen with epidural lipomatosis. No spinal stenosis. Moderate bilateral foraminal stenosis.   IMPRESSION: 1. At L4-5 there is a broad-based disc osteophyte complex with a broad small central disc  protrusion. Moderate bilateral facet arthropathy. Severe spinal stenosis. Severe left and moderate right foraminal stenosis. 2. At L5-S1 there is a broad-based disc osteophyte complex. Moderate right and mild left facet arthropathy. Prominence of the epidural fat deforming the thecal sac as can be seen with epidural lipomatosis. No spinal stenosis. Moderate bilateral foraminal stenosis. 3. Otherwise, lumbar spine spondylosis as described above. Overall, no significant interval change compared with the prior exam. 4.  No acute osseous injury of the lumbar spine.     Electronically Signed   By: Kathreen Devoid M.D.   On: 10/26/2021 14:42     PATIENT SURVEYS:  FOTO 54/100   SCREENING FOR RED FLAGS: Bowel or bladder incontinence: No Spinal tumors: No Cauda equina syndrome: No Compression fracture: No Abdominal aneurysm: No   COGNITION:           Overall cognitive status: Within functional limits for tasks assessed                          SENSATION: Neuropathy in feet    MUSCLE LENGTH: Hamstrings: Right 70 deg; Left 70 deg (muscular restriction in hamstrings) Thomas test: Negative bilaterally    POSTURE: No Significant postural limitations   PALPATION: L4-L5 central spinous process    LUMBAR ROM:    AROM eval  Flexion 75%*  Extension 50%*  Right lateral flexion 100%  Left lateral flexion 100%  Right rotation 100%  Left rotation 100%   (Blank rows = not tested)   LOWER EXTREMITY ROM:      Active  Right 11/18/2021 Left 11/18/2021  Hip flexion 120 120  Hip extension 30 30  Hip abduction 45 45  Hip adduction 30 30  Hip internal rotation 45 45  Hip external rotation 45 45  Knee flexion 135 135  Knee  extension 0 0  Ankle dorsiflexion 20 20  Ankle plantarflexion 50 50  Ankle inversion      Ankle eversion       (Blank rows = not tested)        LOWER EXTREMITY MMT:     MMT Right eval Left eval  Hip flexion 5 5  Hip extension 3+ 3+  Hip abduction 5 5   Hip adduction 3+ 3+  Hip internal rotation      Hip external rotation      Knee flexion 5 5  Knee extension 5 5  Ankle dorsiflexion 5 5  Ankle plantarflexion      Ankle inversion      Ankle eversion       (Blank rows = not tested)   LUMBAR SPECIAL TESTS:  Straight leg raise test: Negative, FABER test: NT, Thomas test: Positive, and FADIR: NT    FUNCTIONAL TESTS:  5 times sit to stand: 22 sec  30 seconds chair stand test 6 minute walk test: 725 ft with quad cane  10 meter walk test: nt Dynamic Gait Index: 14/24   GAIT: Distance walked: 50 ft  Assistive device utilized: Quad cane large base Level of assistance: Modified independence Comments: Decreased step length and stance time       TODAY'S TREATMENT:  [facility] Adult PT Treatment:                                                                                                                             DATE:12/14/21              Nu Step 5 min with 4 resistance for 5 min                          DGI: 20/24              -Mild Impairment with horizontal head turns to left, obstacles, stairs, and stop and turn               FOTO:              Sit to Stand from 18 inch mat height 1 x 15    -min VC for quickly standing up and slowing eccentric portion              Sit to Stand from 18 inch mat height 1 x 10   -min VC for quickly standing up and slowing eccentric portion   Sit to Stand from 22 inch mat height 1 x 15    Standing Hip Adduction with BUE support 3 x 15  -min VC to sequence exercise  DATE:12/09/21  AA/ROM Nustep: seat 7, arms 7, Level 3 x 5 minutes VITALS: BP 184/84, no symptoms  Overground Walking 5 laps without use AD  VITALS: BP 183/88  5xSTS: 12 SEC  30 sec chair stands: 10 reps   Ongoing education about taking BP and following up with PCP                                                                                                                             DATE:12/07/21  -AA/ROM Nustep: seat 9, arms 9, Level 5 x 4 minutes    -Overground AMB c WBQC RUE x 860f, ModI level (requires 1 sit rest break halfway, timer stopped); reports some soreness in bilat glutes after halfway point. 8033fin 9m4m (0.62m66m   -10xSTS hands on knees (slight knee pain, gray chair)  -side stepping laps around elevated plinth, hands free 1x CW, 1x CCW, 1x CW, 1x CCW   -BP assessment: (has not taken meds yet due to no prepping his pills yet) 191/96 mmHg 91 BPM (education on importance of taking BP meds as scheduled and prior to PT sessions)    -airex pad standing chest press with QC x20 -airex stance alternate lateral reach 1x20 -airex stance overhead wall rebound pink physio ball x15 (minGuard assist due to 4 LOB requiring assist)                                                                                                                             DATE:12/03/21   Nu-Step level 5 for seat and arms and resistance at level 3 for resistance for 5 min  Sit to Stand 1 x 10 with 2 x 8 lb DB  -Pt reports increased knee pain  OMEGA Leg Extension #65 1 x 10  OMEGA Leg Extension #75 2 x 10  Standing Hip Abduction with BUE support 3 x 10    NMR  10 m fast and slow x 10 reps  10 m 1 ft obstacles x 3 done for 10 reps  10 m 2 ft obstacles x 2 with fast and slow combo for 10 reps  10 m with change in direction turns for 10 reps  10 m side step x 6  10 m side step with yellow TB x 6  DATE:12/01/21  THEREX   Nu-Step level 5 for seat and arms and resistance at level 3 for resistance for 5 min  Sit to Stand from 18 inch mat height 1 x 10  Sit to Stand from 18 inch mat height 2 x 10 while holding water jug  Abdominal Crunches with arms crossed 3 x 10   NMR : Performed  without AD   10 m x 2 walking normal pace  10 m x 6 step over 3 -6 inch obstacles  10 m x 6 right side step alternating with left side step  10 m x 6 backwards walk  -min VC to increase step length stepping backwards                                                                                                                               DATE:11/26/21   THEREX   Falls training  -Sequence demonstration supine to side lying to full kneel to half knee with UE support to full stand  Sit to Stand 1 x 10 from 20 inch mat height   Sit to Stand 2 x 9 from 18 inch mat height    NMR   Figure 8 -10 ft x 30            Amb with horizontal head turns 10 m x 30             Amb with vertical head turns 10 m x 30                                                                                                                                      DATE:11/23/21                          45mT: 725 ft with use of quad cane                                               -HR 107, SpO2 96%                         5xSTS: 22 sec at 18 inch mat height  30 sec: 7 reps                             75-77 <11 reps for men indicates risk of falling                          DGI: 14/24                           -Severe impairment for obstacles and changing direction                           -Moderate impairment for steps                            -Mild impairment for head turns                          Sit to Stand with no UE support from 18 inch mat height 3 x 10                           -min VC to decrease eccentric speed and increase concentric speed      PATIENT EDUCATION:  Education details: form and technique and explanation about deficits  Person educated: Patient Education method: Explanation, Demonstration, Tactile cues, Verbal cues, and Handouts Education comprehension: verbalized understanding, returned demonstration, verbal cues required, and tactile cues required      HOME EXERCISE PROGRAM: Access Code: [email or web address] URL: [email or web address] Date: 12/09/2021 Prepared by: Bradly Chris  Exercises - Supine Lower Trunk Rotation  - 1 x daily - 3 sets - 10 reps - Supine Single Knee to Chest Stretch  - 1 x daily - 2 sets - 10 reps - 3 hold - Prone Quadriceps Stretch with Strap  - 1 x daily - 3 reps - 60 hold - Sit to Stand with Arms Crossed  - 3 x weekly - 3 sets - 15 reps - Figure-8 Walking Around [place]  - 1 x daily - 10 reps - Curl Up with Arms Crossed  - 3 x weekly - 3 sets - 10 reps - Standing Hip Abduction with Counter Support  - 3 x weekly - 3 sets - 10 reps -Standing Hip Adduction with BUE support 3 x weekly- 3 sets- 10 reps   ASSESSMENT:   CLINICAL IMPRESSION:  Pt shows improved dynamic balance with improved ability to perform head turns and step over obstacles. He is now no longer at risk for falls from a dynamic balance perspective. He continues to show difficulty with LE endurance requiring elevation of seat to perform 15 reps. Pt also shows improved self perception of function as evidenced by an increase in his FOTO score. He will continue to benefit from further skilled PT to improve aforementioned deficits to return to walking long distances without taking a rest like shopping for groceries or doing yard work.  OBJECTIVE IMPAIRMENTS: Abnormal gait, decreased balance, decreased endurance, decreased mobility, difficulty walking, decreased ROM, decreased strength, hypomobility, impaired flexibility, impaired sensation, and pain.    ACTIVITY LIMITATIONS: carrying, lifting, bending, standing, squatting, stairs, and locomotion level   PARTICIPATION LIMITATIONS: cleaning, shopping, community activity, and yard work   PERSONAL FACTORS: Age, Fitness, Time since onset of injury/illness/exacerbation, and  3+ comorbidities: h/o of prostate cancer, hypertension, T2DM  are also affecting patient's functional outcome.    REHAB POTENTIAL: Fair  severity of symptoms and chronicity of condition    CLINICAL DECISION MAKING: Stable/uncomplicated   EVALUATION COMPLEXITY: Low     GOALS: Goals reviewed with patient? No   SHORT TERM GOALS: Target date: 12/02/2021   Pt will be independent with HEP in order to improve strength and balance in order to decrease fall risk and improve function at home and work.   Baseline: Completing independently  Goal status: Ongoing    2.  Pt will decrease 5TSTS by at least 3 seconds in order to demonstrate clinically significant improvement in LE strengt Baseline: 22 sec   12/09/21: 12 sec  Goal status: Achieved    3.  Pt will increase 6MWT by at least 4m(1610f in order to demonstrate clinically significant improvement in cardiopulmonary endurance and community ambulation Baseline: 725 ft with use of quad cane  Goal status: Ongoing      LONG TERM GOALS: Target date: 01/27/2022   Patient will have improved function and activity level as evidenced by an increase in FOTO score by 10 points or more.  Baseline: 54/100 with target of 100  12/14/21: 57/100 Goal status: Ongoing    2.  Patient will perform 5 x STS in  <12 sec to demonstrate improved LE strength and to decrease risk of falling.   Baseline: 22 sec   12/09/21: 12 sec  Goal status: Partially Met    3.  Patient will perform >=11 reps on 30 sec chair stands to demonstrate improve LE endurance and to decrease risk of falling  Baseline: 7 reps   12/09/21: 10 reps  Goal status: Partially Met    4.  Patient will demonstrate reduced falls risk as evidenced by Dynamic Gait Index (DGI) >19/24. Baseline: 14/24   12/14/21: 20/24 Goal status: Achieved    5.  Patient will ambulate >=1,000 ft during 49m35mto show improved aerobic endurance and ability to walk distance that signifies he is a comHydrographic surveyoraseline: 725 ft with use of quad cane  Goal status: Partially Met      PLAN: PT FREQUENCY: 1-2x/week   PT DURATION: 10 weeks    PLANNED INTERVENTIONS: Therapeutic exercises, Neuromuscular re-education, Balance training, Gait training, Patient/Family education, Self Care, Joint mobilization, Joint manipulation, Stair training, Vestibular training, DME instructions, Aquatic Therapy, Dry Needling, Electrical stimulation, Spinal manipulation, Spinal mobilization, Cryotherapy, Moist heat, Traction, Manual therapy, and Re-evaluation.   PLAN FOR NEXT SESSION: Med List in relation to balance.  Dynamic balance: horizontal head turns and Tandem walk. Continue to progress LE strengthening. Give patient dietary information.   DanBradly Chris, DPT  12/14/2021, 9:08 AM

## 2021-12-16 ENCOUNTER — Ambulatory Visit: Payer: PPO

## 2021-12-16 DIAGNOSIS — M5459 Other low back pain: Secondary | ICD-10-CM

## 2021-12-16 DIAGNOSIS — R262 Difficulty in walking, not elsewhere classified: Secondary | ICD-10-CM

## 2021-12-16 NOTE — Therapy (Signed)
OUTPATIENT PHYSICAL THERAPY TREATMENT NOTE   Patient Name: Cristian Martin MRN: [id number] DOB:[birth year], 76 y.o., male Today's Date: 12/16/2021 [facility]CP:  [facility] not [facility]  REFERRING PROVIDER: Dr. Meade Maw  END OF SESSION:   PT End of Session - 12/16/21 0848     Visit Number 9    Number of Visits 20    Date for PT Re-Evaluation 01/27/22    Authorization Type [employer]    Authorization Time Period 11/18/21-01/27/22    Authorization - Visit Number 9    Authorization - Number of Visits 20    Progress Note Due on Visit 10    PT Start Time 0845    PT Stop Time 0930    PT Time Calculation (min) 45 min    Equipment Utilized During Treatment Gait belt;Back brace    Activity Tolerance Patient tolerated treatment well;Patient limited by pain    Behavior During Therapy WFL for tasks assessed/performed             Past Medical History:  Diagnosis Date   Agent orange exposure    Coronary artery disease    a. 04/2019 Cath: LM 99, LAD 80p, D2 70, LCX 50ost/p, RCA 70p, EF 55-65%; b. 04/2019 CABG x 3 (LIMA to LAD, SVG to OM1, SVG to RCA).   Diabetic peripheral neuropathy (HCC)    Diastolic dysfunction    a 04/2019 Echo: EF 55-60%, mod conc LVH, Gr1 DD. Nl RV fxn. Mild Ao sclerosis w/o stenosis.   GERD (gastroesophageal reflux disease)    Hyperlipidemia    Hyperplasia of prostate with lower urinary tract symptoms (LUTS)    Hypertension    Prostate cancer [facility]) urologist-- dr ottelin/  oncologist-- dr Tammi Klippel   dx 02-21-2018  -- Stage T1c, Gleason 3+4   Type 2 diabetes mellitus treated with insulin ([place])    followed by [facility] in [place]   Wears dentures    upper    Wears glasses    "wears to help double vision"   Past Surgical History:  Procedure Laterality Date   COLONOSCOPY WITH PROPOFOL N/A 08/16/2019   Procedure: COLONOSCOPY WITH PROPOFOL;  Surgeon: Lin Landsman, MD;  Location: [place];  Service: Gastroenterology;  Laterality: N/A;   CORONARY  ARTERY BYPASS GRAFT N/A 04/30/2019   Procedure: CORONARY ARTERY BYPASS GRAFTING (CABG) TIMES THREE USING LEFT INTERNAL MAMMARY AND RIGHT GREATER SAPHENOUS VEIN HARVESTED ENDOSCOPICALLY. MAMMARY ARTERY TO LAD, SAPHENOUS VEIN GRAFT TO  OM1, SAPHENOUS VEING GRAFT TO RIGHT. BIOPSY OF MEDIASTINAL TISSUE PLACEMENT OF RIGHT FEMORAL A-LINE;  Surgeon: Grace Isaac, MD;  Location: [place];  Service: Open Heart Surgery;  Laterality: N/A;   CYSTOSCOPY N/A 07/14/2018   Procedure: CYSTOSCOPY;  Surgeon: Kathie Rhodes, MD;  Location: [facility];  Service: Urology;  Laterality: N/A;  no seeds in bladder per Dr Karsten Ro   ESOPHAGOGASTRODUODENOSCOPY (EGD) WITH PROPOFOL N/A 08/16/2019   Procedure: ESOPHAGOGASTRODUODENOSCOPY (EGD) WITH PROPOFOL;  Surgeon: Lin Landsman, MD;  Location: [place];  Service: Gastroenterology;  Laterality: N/A;   LEFT HEART CATH AND CORONARY ANGIOGRAPHY N/A 04/27/2019   Procedure: LEFT HEART CATH AND CORONARY ANGIOGRAPHY;  Surgeon: Wellington Hampshire, MD;  Location: [place] [facility];  Service: Cardiovascular;  Laterality: N/A;   PROSTATE BIOPSY  02-21-2018  dr Karsten Ro in office   RADIOACTIVE SEED IMPLANT N/A 07/14/2018   Procedure: RADIOACTIVE SEED IMPLANT/BRACHYTHERAPY IMPLANT;  Surgeon: Kathie Rhodes, MD;  Location: [place];  Service: Urology;  Laterality: N/A;  6  seeds    SHOULDER ARTHROSCOPY Left 2018   in [place]   "remove spur"   SPACE OAR INSTILLATION N/A 07/14/2018   Procedure: SPACE OAR INSTILLATION;  Surgeon: Kathie Rhodes, MD;  Location: [facility];  Service: Urology;  Laterality: N/A;   TEE WITHOUT CARDIOVERSION N/A 04/30/2019   Procedure: TRANSESOPHAGEAL ECHOCARDIOGRAM (TEE);  Surgeon: Grace Isaac, MD;  Location: [place];  Service: Open Heart Surgery;  Laterality: N/A;   Patient Active Problem List   Diagnosis Date Noted   Paresthesia of both feet 04/09/2021   Spinal stenosis, lumbar region, with neurogenic  claudication 03/03/2021   Chronic radicular lumbar pain 03/03/2021   Lumbar degenerative disc disease 03/03/2021   Lumbar facet arthropathy 03/03/2021   Chronic pain syndrome 03/03/2021   Iron deficiency anemia    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   Coronary artery disease 04/30/2019   Unstable angina ([place]) 04/27/2019   Malignant neoplasm of prostate ([place]) 04/09/2018   GERD (gastroesophageal reflux disease) 04/07/2018   Hx of colonic polyps 04/07/2018   Benign essential hypertension 12/02/2015   Hyperlipidemia, mixed 05/02/2015   Chronic painful diabetic neuropathy ([place]) 03/13/2015   DM (diabetes mellitus) type II controlled with renal manifestation ([place]) 08/08/2013    REFERRING DIAG:  \R42.706 (ICD-10-CM) - Neurogenic claudication due to lumbar spinal stenosis M51.16 (ICD-10-CM) - Lumbar disc herniation with radiculopathy   THERAPY DIAG:  Other low back pain  Difficulty in walking, not elsewhere classified  Rationale for Evaluation and Treatment Rehabilitation  PERTINENT HISTORY: Pt reports experiencing pain in his low back that spreads to his buttocks especially when standing up and walking. He is unable to walk for long periods of time because of back pain and he needs to take a break. He now uses a quad cane because of his balance. He describes a decline in his function after retiring at 76 years old. He to have a heart bypass.   PRECAUTIONS: Falls   SUBJECTIVE:                                                                                                                                                                                      SUBJECTIVE STATEMENT:  Pt reports being adamant on needing surgery for low back. Reports 8/10 LBP due to being early morning.   PAIN:  Are you having pain? Yes: NPRS scale: 8/10 Pain location: L4 CPA  Pain description: Achy  Aggravating factors: Waking up in morning  Relieving factors: Wearing back brace    OBJECTIVE: (objective measures  completed at initial evaluation unless otherwise dated)              VITALS:  BP 185/79 HR 79    DIAGNOSTIC FINDINGS:  CLINICAL DATA:  Low back pain, pain radiates down both legs. No known injury.   EXAM: MRI LUMBAR SPINE WITHOUT CONTRAST   TECHNIQUE: Multiplanar, multisequence MR imaging of the lumbar spine was performed. No intravenous contrast was administered.   COMPARISON:  12/16/2020   FINDINGS: Segmentation:  Standard.   Alignment:  Physiologic.   Vertebrae: No acute fracture, evidence of discitis, or aggressive bone lesion.   Conus medullaris and cauda equina: Conus extends to the L1 level. Conus and cauda equina appear normal.   Paraspinal and other soft tissues: No acute paraspinal abnormality.   Disc levels:   Disc spaces: Degenerative disease with disc height loss at L1-2, L2-3, L3-4, L4-5 and L5-S1.   T12-L1: No significant disc bulge. No neural foraminal stenosis. No central canal stenosis.   L1-L2: Mild broad-based disc bulge. Mild spinal stenosis. No foraminal stenosis.   L2-L3: Broad-based disc osteophyte complex flattening the ventral thecal sac. Mild spinal stenosis. No foraminal stenosis.   L3-L4: Broad-based disc osteophyte complex. Mild spinal stenosis. Mild bilateral facet arthropathy. No right foraminal stenosis. Mild left foraminal stenosis.   L4-L5: Broad-based disc osteophyte complex with a broad small central disc protrusion. Moderate bilateral facet arthropathy. Severe spinal stenosis. Severe left and moderate right foraminal stenosis.   L5-S1: Broad-based disc osteophyte complex. Moderate right and mild left facet arthropathy. Prominence of the epidural fat deforming the thecal sac as can be seen with epidural lipomatosis. No spinal stenosis. Moderate bilateral foraminal stenosis.   IMPRESSION: 1. At L4-5 there is a broad-based disc osteophyte complex with a broad small central disc protrusion. Moderate bilateral  facet arthropathy. Severe spinal stenosis. Severe left and moderate right foraminal stenosis. 2. At L5-S1 there is a broad-based disc osteophyte complex. Moderate right and mild left facet arthropathy. Prominence of the epidural fat deforming the thecal sac as can be seen with epidural lipomatosis. No spinal stenosis. Moderate bilateral foraminal stenosis. 3. Otherwise, lumbar spine spondylosis as described above. Overall, no significant interval change compared with the prior exam. 4.  No acute osseous injury of the lumbar spine.     Electronically Signed   By: Kathreen Devoid M.D.   On: 10/26/2021 14:42     PATIENT SURVEYS:  FOTO 54/100   SCREENING FOR RED FLAGS: Bowel or bladder incontinence: No Spinal tumors: No Cauda equina syndrome: No Compression fracture: No Abdominal aneurysm: No   COGNITION:           Overall cognitive status: Within functional limits for tasks assessed                          SENSATION: Neuropathy in feet    MUSCLE LENGTH: Hamstrings: Right 70 deg; Left 70 deg (muscular restriction in hamstrings) Thomas test: Negative bilaterally    POSTURE: No Significant postural limitations   PALPATION: L4-L5 central spinous process    LUMBAR ROM:    AROM eval  Flexion 75%*  Extension 50%*  Right lateral flexion 100%  Left lateral flexion 100%  Right rotation 100%  Left rotation 100%   (Blank rows = not tested)   LOWER EXTREMITY ROM:      Active  Right 11/18/2021 Left 11/18/2021  Hip flexion 120 120  Hip extension 30 30  Hip abduction 45 45  Hip adduction 30 30  Hip internal rotation 45 45  Hip external rotation 45 45  Knee flexion 135 135  Knee extension  0 0  Ankle dorsiflexion 20 20  Ankle plantarflexion 50 50  Ankle inversion      Ankle eversion       (Blank rows = not tested)        LOWER EXTREMITY MMT:     MMT Right eval Left eval  Hip flexion 5 5  Hip extension 3+ 3+  Hip abduction 5 5  Hip adduction 3+ 3+  Hip  internal rotation      Hip external rotation      Knee flexion 5 5  Knee extension 5 5  Ankle dorsiflexion 5 5  Ankle plantarflexion      Ankle inversion      Ankle eversion       (Blank rows = not tested)   LUMBAR SPECIAL TESTS:  Straight leg raise test: Negative, FABER test: NT, Thomas test: Positive, and FADIR: NT    FUNCTIONAL TESTS:  5 times sit to stand: 22 sec  30 seconds chair stand test 6 minute walk test: 725 ft with quad cane  10 meter walk test: nt Dynamic Gait Index: 14/24   GAIT: Distance walked: 50 ft  Assistive device utilized: Quad cane large base Level of assistance: Modified independence Comments: Decreased step length and stance time       TODAY'S TREATMENT:  [facility] Adult PT Treatment:                                                                                                                             DATE:12/16/21             There.ex: Nu Step 5 min with 4 resistance for 5 min  Circuit exercises:     STS: x10    300' gait with 2# AW's      Hip abduction: 2# AW's, 2x10    300' gait with 2# AW's  Hip extension: 2# AW's, 2x10    300' gait with 2# AW's     Hip flexion: 2# AW's, 2x10     300' gait with 2# AW's   Seated rest b/t circuits exercises for recovery. Intermittent VC's for improved foot clearance. Good carryover.  PATIENT EDUCATION:  Education details: form and technique and explanation about deficits  Person educated: Patient Education method: Explanation, Demonstration, Tactile cues, Verbal cues, and Handouts Education comprehension: verbalized understanding, returned demonstration, verbal cues required, and tactile cues required     HOME EXERCISE PROGRAM: Access Code: [email or web address] URL: [email or web address] Date: 12/09/2021 Prepared by: Bradly Chris  Exercises - Supine Lower Trunk Rotation  - 1 x daily - 3 sets - 10 reps - Supine Single Knee to Chest Stretch  - 1 x daily - 2 sets - 10 reps - 3 hold - Prone  Quadriceps Stretch with Strap  - 1 x daily - 3 reps - 60 hold - Sit to Stand with Arms Crossed  - 3 x weekly - 3 sets - 15 reps - Figure-8 Walking Around [employer]  -  1 x daily - 10 reps - Curl Up with Arms Crossed  - 3 x weekly - 3 sets - 10 reps - Standing Hip Abduction with Counter Support  - 3 x weekly - 3 sets - 10 reps -Standing Hip Adduction with BUE support 3 x weekly- 3 sets- 10 reps   ASSESSMENT:   CLINICAL IMPRESSION: Continuing PT POC with focus of circuity training to improve LE strength/endurance for improved standing and walking tolerance. Pt remains highly motivated and completes circuit without exacerbation of his LBP. Seated therapeutic rest breaks provided b/t circuit exercise for recovery due to SOB. Pt will continue to benefit from skilled PT services to progress strength, gait and balance and reduced pain to optimize function and reduced risk of falls.    OBJECTIVE IMPAIRMENTS: Abnormal gait, decreased balance, decreased endurance, decreased mobility, difficulty walking, decreased ROM, decreased strength, hypomobility, impaired flexibility, impaired sensation, and pain.    ACTIVITY LIMITATIONS: carrying, lifting, bending, standing, squatting, stairs, and locomotion level   PARTICIPATION LIMITATIONS: cleaning, shopping, community activity, and yard work   PERSONAL FACTORS: Age, Fitness, Time since onset of injury/illness/exacerbation, and 3+ comorbidities: h/o of prostate cancer, hypertension, T2DM  are also affecting patient's functional outcome.    REHAB POTENTIAL: Fair severity of symptoms and chronicity of condition    CLINICAL DECISION MAKING: Stable/uncomplicated   EVALUATION COMPLEXITY: Low     GOALS: Goals reviewed with patient? No   SHORT TERM GOALS: Target date: 12/02/2021   Pt will be independent with HEP in order to improve strength and balance in order to decrease fall risk and improve function at home and work.   Baseline: Completing independently   Goal status: Ongoing    2.  Pt will decrease 5TSTS by at least 3 seconds in order to demonstrate clinically significant improvement in LE strengt Baseline: 22 sec   12/09/21: 12 sec  Goal status: Achieved    3.  Pt will increase 6MWT by at least 69m([id number] in order to demonstrate clinically significant improvement in cardiopulmonary endurance and community ambulation Baseline: 725 ft with use of quad cane  Goal status: Ongoing      LONG TERM GOALS: Target date: 01/27/2022   Patient will have improved function and activity level as evidenced by an increase in FOTO score by 10 points or more.  Baseline: 54/100 with target of 100  12/14/21: 57/100 Goal status: Ongoing    2.  Patient will perform 5 x STS in  <12 sec to demonstrate improved LE strength and to decrease risk of falling.   Baseline: 22 sec   12/09/21: 12 sec  Goal status: Partially Met    3.  Patient will perform >=11 reps on 30 sec chair stands to demonstrate improve LE endurance and to decrease risk of falling  Baseline: 7 reps   12/09/21: 10 reps  Goal status: Partially Met    4.  Patient will demonstrate reduced falls risk as evidenced by Dynamic Gait Index (DGI) >19/24. Baseline: 14/24   12/14/21: 20/24 Goal status: Achieved    5.  Patient will ambulate >=1,000 ft during 99m24mto show improved aerobic endurance and ability to walk distance that signifies he is a comHydrographic surveyoraseline: 725 ft with use of quad cane  Goal status: Partially Met      PLAN: PT FREQUENCY: 1-2x/week   PT DURATION: 10 weeks   PLANNED INTERVENTIONS: Therapeutic exercises, Neuromuscular re-education, Balance training, Gait training, Patient/Family education, Self Care, Joint mobilization, Joint manipulation, Stair training, Vestibular  training, DME instructions, Aquatic Therapy, Dry Needling, Electrical stimulation, Spinal manipulation, Spinal mobilization, Cryotherapy, Moist heat, Traction, Manual therapy, and Re-evaluation.   PLAN  FOR NEXT SESSION: Med List in relation to balance.  Dynamic balance: horizontal head turns and Tandem walk. Continue to progress LE strengthening. Give patient dietary information.   Salem Caster. Fairly IV, PT, DPT Physical Therapist- [facility]  12/16/2021, 9:40 AM

## 2021-12-21 ENCOUNTER — Ambulatory Visit: Payer: PPO | Admitting: Physical Therapy

## 2021-12-21 ENCOUNTER — Encounter: Payer: Self-pay | Admitting: Physical Therapy

## 2021-12-21 DIAGNOSIS — M5459 Other low back pain: Secondary | ICD-10-CM | POA: Diagnosis not present

## 2021-12-21 DIAGNOSIS — R262 Difficulty in walking, not elsewhere classified: Secondary | ICD-10-CM

## 2021-12-21 NOTE — Therapy (Addendum)
OUTPATIENT PHYSICAL THERAPY PROGRESS NOTE   Patient Name: Cristian Martin MRN: [id number] DOB:[birth year], 76 y.o., male Today's Date: 12/21/2021 [facility]CP:  [facility] not [facility]  REFERRING PROVIDER: Dr. Meade Maw  END OF SESSION:   PT End of Session - 12/21/21 0850     Visit Number 10    Number of Visits 15    Date for PT Re-Evaluation 01/27/22    Authorization Type [employer]    Authorization Time Period 11/18/21-01/27/22    Authorization - Visit Number 10    Authorization - Number of Visits 15    Progress Note Due on Visit 10    PT Start Time 0845    PT Stop Time 0930    PT Time Calculation (min) 45 min    Equipment Utilized During Treatment Gait belt;Back brace    Activity Tolerance Patient tolerated treatment well;Patient limited by pain    Behavior During Therapy WFL for tasks assessed/performed             Past Medical History:  Diagnosis Date   Agent orange exposure    Coronary artery disease    a. 04/2019 Cath: LM 99, LAD 80p, D2 70, LCX 50ost/p, RCA 70p, EF 55-65%; b. 04/2019 CABG x 3 (LIMA to LAD, SVG to OM1, SVG to RCA).   Diabetic peripheral neuropathy (HCC)    Diastolic dysfunction    a 04/2019 Echo: EF 55-60%, mod conc LVH, Gr1 DD. Nl RV fxn. Mild Ao sclerosis w/o stenosis.   GERD (gastroesophageal reflux disease)    Hyperlipidemia    Hyperplasia of prostate with lower urinary tract symptoms (LUTS)    Hypertension    Prostate cancer [facility]) urologist-- dr ottelin/  oncologist-- dr Tammi Klippel   dx 02-21-2018  -- Stage T1c, Gleason 3+4   Type 2 diabetes mellitus treated with insulin ([place])    followed by [facility] in [place]   Wears dentures    upper    Wears glasses    "wears to help double vision"   Past Surgical History:  Procedure Laterality Date   COLONOSCOPY WITH PROPOFOL N/A 08/16/2019   Procedure: COLONOSCOPY WITH PROPOFOL;  Surgeon: Lin Landsman, MD;  Location: [place];  Service: Gastroenterology;  Laterality: N/A;   CORONARY  ARTERY BYPASS GRAFT N/A 04/30/2019   Procedure: CORONARY ARTERY BYPASS GRAFTING (CABG) TIMES THREE USING LEFT INTERNAL MAMMARY AND RIGHT GREATER SAPHENOUS VEIN HARVESTED ENDOSCOPICALLY. MAMMARY ARTERY TO LAD, SAPHENOUS VEIN GRAFT TO  OM1, SAPHENOUS VEING GRAFT TO RIGHT. BIOPSY OF MEDIASTINAL TISSUE PLACEMENT OF RIGHT FEMORAL A-LINE;  Surgeon: Grace Isaac, MD;  Location: [place];  Service: Open Heart Surgery;  Laterality: N/A;   CYSTOSCOPY N/A 07/14/2018   Procedure: CYSTOSCOPY;  Surgeon: Kathie Rhodes, MD;  Location: [facility];  Service: Urology;  Laterality: N/A;  no seeds in bladder per Dr Karsten Ro   ESOPHAGOGASTRODUODENOSCOPY (EGD) WITH PROPOFOL N/A 08/16/2019   Procedure: ESOPHAGOGASTRODUODENOSCOPY (EGD) WITH PROPOFOL;  Surgeon: Lin Landsman, MD;  Location: [place];  Service: Gastroenterology;  Laterality: N/A;   LEFT HEART CATH AND CORONARY ANGIOGRAPHY N/A 04/27/2019   Procedure: LEFT HEART CATH AND CORONARY ANGIOGRAPHY;  Surgeon: Wellington Hampshire, MD;  Location: [place] [facility];  Service: Cardiovascular;  Laterality: N/A;   PROSTATE BIOPSY  02-21-2018  dr Karsten Ro in office   RADIOACTIVE SEED IMPLANT N/A 07/14/2018   Procedure: RADIOACTIVE SEED IMPLANT/BRACHYTHERAPY IMPLANT;  Surgeon: Kathie Rhodes, MD;  Location: [place];  Service: Urology;  Laterality: N/A;  68  seeds    SHOULDER ARTHROSCOPY Left 2018   in [place]   "remove spur"   SPACE OAR INSTILLATION N/A 07/14/2018   Procedure: SPACE OAR INSTILLATION;  Surgeon: Kathie Rhodes, MD;  Location: [facility];  Service: Urology;  Laterality: N/A;   TEE WITHOUT CARDIOVERSION N/A 04/30/2019   Procedure: TRANSESOPHAGEAL ECHOCARDIOGRAM (TEE);  Surgeon: Grace Isaac, MD;  Location: [place];  Service: Open Heart Surgery;  Laterality: N/A;   Patient Active Problem List   Diagnosis Date Noted   Paresthesia of both feet 04/09/2021   Spinal stenosis, lumbar region, with neurogenic  claudication 03/03/2021   Chronic radicular lumbar pain 03/03/2021   Lumbar degenerative disc disease 03/03/2021   Lumbar facet arthropathy 03/03/2021   Chronic pain syndrome 03/03/2021   Iron deficiency anemia    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   Coronary artery disease 04/30/2019   Unstable angina ([place]) 04/27/2019   Malignant neoplasm of prostate ([place]) 04/09/2018   GERD (gastroesophageal reflux disease) 04/07/2018   Hx of colonic polyps 04/07/2018   Benign essential hypertension 12/02/2015   Hyperlipidemia, mixed 05/02/2015   Chronic painful diabetic neuropathy ([place]) 03/13/2015   DM (diabetes mellitus) type II controlled with renal manifestation ([place]) 08/08/2013    REFERRING DIAG:  \X65.537 (ICD-10-CM) - Neurogenic claudication due to lumbar spinal stenosis M51.16 (ICD-10-CM) - Lumbar disc herniation with radiculopathy   THERAPY DIAG:  Other low back pain  Difficulty in walking, not elsewhere classified  Rationale for Evaluation and Treatment Rehabilitation  PERTINENT HISTORY: Pt reports experiencing pain in his low back that spreads to his buttocks especially when standing up and walking. He is unable to walk for long periods of time because of back pain and he needs to take a break. He now uses a quad cane because of his balance. He describes a decline in his function after retiring at 76 years old. He to have a heart bypass.   PRECAUTIONS: Falls   SUBJECTIVE:                                                                                                                                                                                      SUBJECTIVE STATEMENT:  Pt reports that his low back pain continues to remain high despite functional improvements. He is going to see surgeon at the end of the month.   PAIN:  Are you having pain? Yes: NPRS scale: 7/10 Pain location: L4 CPA  Pain description: Achy  Aggravating factors: Waking up in morning  Relieving factors: Wearing back brace     OBJECTIVE: (objective measures completed at initial evaluation unless otherwise dated)  VITALS: BP 185/79 HR 79    DIAGNOSTIC FINDINGS:  CLINICAL DATA:  Low back pain, pain radiates down both legs. No known injury.   EXAM: MRI LUMBAR SPINE WITHOUT CONTRAST   TECHNIQUE: Multiplanar, multisequence MR imaging of the lumbar spine was performed. No intravenous contrast was administered.   COMPARISON:  12/16/2020   FINDINGS: Segmentation:  Standard.   Alignment:  Physiologic.   Vertebrae: No acute fracture, evidence of discitis, or aggressive bone lesion.   Conus medullaris and cauda equina: Conus extends to the L1 level. Conus and cauda equina appear normal.   Paraspinal and other soft tissues: No acute paraspinal abnormality.   Disc levels:   Disc spaces: Degenerative disease with disc height loss at L1-2, L2-3, L3-4, L4-5 and L5-S1.   T12-L1: No significant disc bulge. No neural foraminal stenosis. No central canal stenosis.   L1-L2: Mild broad-based disc bulge. Mild spinal stenosis. No foraminal stenosis.   L2-L3: Broad-based disc osteophyte complex flattening the ventral thecal sac. Mild spinal stenosis. No foraminal stenosis.   L3-L4: Broad-based disc osteophyte complex. Mild spinal stenosis. Mild bilateral facet arthropathy. No right foraminal stenosis. Mild left foraminal stenosis.   L4-L5: Broad-based disc osteophyte complex with a broad small central disc protrusion. Moderate bilateral facet arthropathy. Severe spinal stenosis. Severe left and moderate right foraminal stenosis.   L5-S1: Broad-based disc osteophyte complex. Moderate right and mild left facet arthropathy. Prominence of the epidural fat deforming the thecal sac as can be seen with epidural lipomatosis. No spinal stenosis. Moderate bilateral foraminal stenosis.   IMPRESSION: 1. At L4-5 there is a broad-based disc osteophyte complex with a broad small central disc  protrusion. Moderate bilateral facet arthropathy. Severe spinal stenosis. Severe left and moderate right foraminal stenosis. 2. At L5-S1 there is a broad-based disc osteophyte complex. Moderate right and mild left facet arthropathy. Prominence of the epidural fat deforming the thecal sac as can be seen with epidural lipomatosis. No spinal stenosis. Moderate bilateral foraminal stenosis. 3. Otherwise, lumbar spine spondylosis as described above. Overall, no significant interval change compared with the prior exam. 4.  No acute osseous injury of the lumbar spine.     Electronically Signed   By: Kathreen Devoid M.D.   On: 10/26/2021 14:42     PATIENT SURVEYS:  FOTO 54/100   SCREENING FOR RED FLAGS: Bowel or bladder incontinence: No Spinal tumors: No Cauda equina syndrome: No Compression fracture: No Abdominal aneurysm: No   COGNITION:           Overall cognitive status: Within functional limits for tasks assessed                          SENSATION: Neuropathy in feet    MUSCLE LENGTH: Hamstrings: Right 70 deg; Left 70 deg (muscular restriction in hamstrings) Thomas test: Negative bilaterally    POSTURE: No Significant postural limitations   PALPATION: L4-L5 central spinous process    LUMBAR ROM:    AROM eval  Flexion 75%*  Extension 50%*  Right lateral flexion 100%  Left lateral flexion 100%  Right rotation 100%  Left rotation 100%   (Blank rows = not tested)   LOWER EXTREMITY ROM:      Active  Right 11/18/2021 Left 11/18/2021  Hip flexion 120 120  Hip extension 30 30  Hip abduction 45 45  Hip adduction 30 30  Hip internal rotation 45 45  Hip external rotation 45 45  Knee flexion 135 135  Knee  extension 0 0  Ankle dorsiflexion 20 20  Ankle plantarflexion 50 50  Ankle inversion      Ankle eversion       (Blank rows = not tested)        LOWER EXTREMITY MMT:     MMT Right eval Left eval  Hip flexion 5 5  Hip extension 3+ 3+  Hip abduction 5 5   Hip adduction 3+ 3+  Hip internal rotation      Hip external rotation      Knee flexion 5 5  Knee extension 5 5  Ankle dorsiflexion 5 5  Ankle plantarflexion      Ankle inversion      Ankle eversion       (Blank rows = not tested)   LUMBAR SPECIAL TESTS:  Straight leg raise test: Negative, FABER test: NT, Thomas test: Positive, and FADIR: NT    FUNCTIONAL TESTS:  5 times sit to stand: 22 sec  30 seconds chair stand test 6 minute walk test: 725 ft with quad cane  10 meter walk test: nt Dynamic Gait Index: 14/24   GAIT: Distance walked: 50 ft  Assistive device utilized: Quad cane large base Level of assistance: Modified independence Comments: Decreased step length and stance time       TODAY'S TREATMENT:  [facility] Adult PT Treatment:   DATE:  12/21/21  Nu-Step with seat and arm level at 9 for 5 min  70mT:  750 ft -Do not use Quad cane  -NPS 7/10   FOTO Survey: 62  Sit to Stand with #8 DB 1 x 10 at 18 inch mat height   12/16/21             There.ex: Nu Step 5 min with 4 resistance for 5 min  Circuit exercises:     STS: x10    300' gait with 2# AW's      Hip abduction: 2# AW's, 2x10    300' gait with 2# AW's  Hip extension: 2# AW's, 2x10    300' gait with 2# AW's     Hip flexion: 2# AW's, 2x10     300' gait with 2# AW's   Seated rest b/t circuits exercises for recovery. Intermittent VC's for improved foot clearance. Good carryover.  PATIENT EDUCATION:  Education details: form and technique and explanation about deficits  Person educated: Patient Education method: Explanation, Demonstration, Tactile cues, Verbal cues, and Handouts Education comprehension: verbalized understanding, returned demonstration, verbal cues required, and tactile cues required     HOME EXERCISE PROGRAM: Access Code: [email or web address] URL: [email or web address] Date: 12/09/2021 Prepared by: DBradly Chris Exercises - Supine Lower Trunk Rotation  - 1 x daily - 3  sets - 10 reps - Supine Single Knee to Chest Stretch  - 1 x daily - 2 sets - 10 reps - 3 hold - Prone Quadriceps Stretch with Strap  - 1 x daily - 3 reps - 60 hold - Sit to Stand with Arms Crossed  - 3 x weekly - 3 sets - 15 reps - Figure-8 Walking Around [place]  - 1 x daily - 10 reps - Curl Up with Arms Crossed  - 3 x weekly - 3 sets - 10 reps - Standing Hip Abduction with Counter Support  - 3 x weekly - 3 sets - 10 reps -Standing Hip Adduction with BUE support 3 x weekly- 3 sets- 10 reps   ASSESSMENT:   CLINICAL IMPRESSION: Pt presents for progress note with an improvement  in self-perception of function without despite no improvement in aerobic endurance or LE strength or endurance. Five more visits added to plan of care to work towards remaining goals. Pt's pain level continues to remain the same despite improvement towards goals and he is considering surgical intervention. Pt will continue to benefit from skilled PT services to progress strength, gait and balance and reduced pain to optimize function and reduced risk of falls.    OBJECTIVE IMPAIRMENTS: Abnormal gait, decreased balance, decreased endurance, decreased mobility, difficulty walking, decreased ROM, decreased strength, hypomobility, impaired flexibility, impaired sensation, and pain.    ACTIVITY LIMITATIONS: carrying, lifting, bending, standing, squatting, stairs, and locomotion level   PARTICIPATION LIMITATIONS: cleaning, shopping, community activity, and yard work   PERSONAL FACTORS: Age, Fitness, Time since onset of injury/illness/exacerbation, and 3+ comorbidities: h/o of prostate cancer, hypertension, T2DM  are also affecting patient's functional outcome.    REHAB POTENTIAL: Fair severity of symptoms and chronicity of condition    CLINICAL DECISION MAKING: Stable/uncomplicated   EVALUATION COMPLEXITY: Low     GOALS: Goals reviewed with patient? No   SHORT TERM GOALS: Target date: 12/02/2021   Pt will be  independent with HEP in order to improve strength and balance in order to decrease fall risk and improve function at home and work.   Baseline: Completing independently  Goal status: Ongoing    2.  Pt will decrease 5TSTS by at least 3 seconds in order to demonstrate clinically significant improvement in LE strengt Baseline: 22 sec   12/09/21: 12 sec  Goal status: Achieved    3.  Pt will increase 6MWT by at least 69m(1665f in order to demonstrate clinically significant improvement in cardiopulmonary endurance and community ambulation Baseline: 725 ft with use of quad cane   12/21/21: 750 ft without quad cane  Goal status: Ongoing      LONG TERM GOALS: Target date: 01/27/2022   Patient will have improved function and activity level as evidenced by an increase in FOTO score by 10 points or more.  Baseline: 54/100 with target of 100  12/14/21: 57/100  12/21/21: 62/60 Goal status: Achieved    2.  Patient will perform 5 x STS in  <12 sec to demonstrate improved LE strength and to decrease risk of falling.   Baseline: 22 sec   12/09/21: 12 sec 12/21/21: 12 sec Goal status: Partially Met    3.  Patient will perform >=11 reps on 30 sec chair stands to demonstrate improve LE endurance and to decrease risk of falling  Baseline: 7 reps   12/09/21: 10 reps 12/21/21: 10 reps  Goal status: Partially Met    4.  Patient will demonstrate reduced falls risk as evidenced by Dynamic Gait Index (DGI) >19/24. Baseline: 14/24   12/14/21: 20/24 Goal status: Achieved    5.  Patient will ambulate >=1,000 ft during 78m68mto show improved aerobic endurance and ability to walk distance that signifies he is a comHydrographic surveyoraseline: 725 ft with use of quad cane 12/21/21: 750 ft  Goal status: Partially Met      PLAN: PT FREQUENCY: 1-2x/week   PT DURATION: 10 weeks   PLANNED INTERVENTIONS: Therapeutic exercises, Neuromuscular re-education, Balance training, Gait training, Patient/Family education, Self  Care, Joint mobilization, Joint manipulation, Stair training, Vestibular training, DME instructions, Aquatic Therapy, Dry Needling, Electrical stimulation, Spinal manipulation, Spinal mobilization, Cryotherapy, Moist heat, Traction, Manual therapy, and Re-evaluation.   PLAN FOR NEXT SESSION: Continue with strengthening and endurance circuits. Discuss pre-surgical questions  Bradly Chris PT, DPT  Physical Therapist- [facility]  12/21/2021, 9:43 AM

## 2021-12-23 ENCOUNTER — Encounter: Payer: Self-pay | Admitting: Physical Therapy

## 2021-12-23 ENCOUNTER — Ambulatory Visit: Payer: PPO | Admitting: Physical Therapy

## 2021-12-23 DIAGNOSIS — M5459 Other low back pain: Secondary | ICD-10-CM | POA: Diagnosis not present

## 2021-12-23 DIAGNOSIS — R262 Difficulty in walking, not elsewhere classified: Secondary | ICD-10-CM

## 2021-12-23 NOTE — Therapy (Signed)
OUTPATIENT PHYSICAL THERAPY PROGRESS NOTE   Patient Name: Cristian Martin MRN: [id number] DOB:[birth year], 76 y.o., male Today's Date: 12/23/2021 [facility]CP:  [facility] not [facility]  REFERRING PROVIDER: Dr. Meade Maw  END OF SESSION:   PT End of Session - 12/23/21 0933     Visit Number 11    Number of Visits 15    Date for PT Re-Evaluation 01/27/22    Authorization Type [employer]    Authorization Time Period 11/18/21-01/27/22    Authorization - Visit Number 11    Authorization - Number of Visits 15    Progress Note Due on Visit 10    Equipment Utilized During Treatment Gait belt;Back brace    Activity Tolerance Patient tolerated treatment well;Patient limited by pain    Behavior During Therapy [facility] for tasks assessed/performed              Past Medical History:  Diagnosis Date   Agent orange exposure    Coronary artery disease    a. 04/2019 Cath: LM 99, LAD 80p, D2 70, LCX 50ost/p, RCA 70p, EF 55-65%; b. 04/2019 CABG x 3 (LIMA to LAD, SVG to OM1, SVG to RCA).   Diabetic peripheral neuropathy (HCC)    Diastolic dysfunction    a 04/2019 Echo: EF 55-60%, mod conc LVH, Gr1 DD. Nl RV fxn. Mild Ao sclerosis w/o stenosis.   GERD (gastroesophageal reflux disease)    Hyperlipidemia    Hyperplasia of prostate with lower urinary tract symptoms (LUTS)    Hypertension    Prostate cancer [facility]) urologist-- dr ottelin/  oncologist-- dr Tammi Klippel   dx 02-21-2018  -- Stage T1c, Gleason 3+4   Type 2 diabetes mellitus treated with insulin ([facility])    followed by [facility] in [place]   Wears dentures    upper    Wears glasses    "wears to help double vision"   Past Surgical History:  Procedure Laterality Date   COLONOSCOPY WITH PROPOFOL N/A 08/16/2019   Procedure: COLONOSCOPY WITH PROPOFOL;  Surgeon: Lin Landsman, MD;  Location: [place];  Service: Gastroenterology;  Laterality: N/A;   CORONARY ARTERY BYPASS GRAFT N/A 04/30/2019   Procedure: CORONARY ARTERY BYPASS GRAFTING  (CABG) TIMES THREE USING LEFT INTERNAL MAMMARY AND RIGHT GREATER SAPHENOUS VEIN HARVESTED ENDOSCOPICALLY. MAMMARY ARTERY TO LAD, SAPHENOUS VEIN GRAFT TO  OM1, SAPHENOUS VEING GRAFT TO RIGHT. BIOPSY OF MEDIASTINAL TISSUE PLACEMENT OF RIGHT FEMORAL A-LINE;  Surgeon: Grace Isaac, MD;  Location: [place];  Service: Open Heart Surgery;  Laterality: N/A;   CYSTOSCOPY N/A 07/14/2018   Procedure: CYSTOSCOPY;  Surgeon: Kathie Rhodes, MD;  Location: [facility];  Service: Urology;  Laterality: N/A;  no seeds in bladder per Dr Karsten Ro   ESOPHAGOGASTRODUODENOSCOPY (EGD) WITH PROPOFOL N/A 08/16/2019   Procedure: ESOPHAGOGASTRODUODENOSCOPY (EGD) WITH PROPOFOL;  Surgeon: Lin Landsman, MD;  Location: [place];  Service: Gastroenterology;  Laterality: N/A;   LEFT HEART CATH AND CORONARY ANGIOGRAPHY N/A 04/27/2019   Procedure: LEFT HEART CATH AND CORONARY ANGIOGRAPHY;  Surgeon: Wellington Hampshire, MD;  Location: [place] [facility];  Service: Cardiovascular;  Laterality: N/A;   PROSTATE BIOPSY  02-21-2018  dr Karsten Ro in office   RADIOACTIVE SEED IMPLANT N/A 07/14/2018   Procedure: RADIOACTIVE SEED IMPLANT/BRACHYTHERAPY IMPLANT;  Surgeon: Kathie Rhodes, MD;  Location: [place];  Service: Urology;  Laterality: N/A;  76 seeds    SHOULDER ARTHROSCOPY Left 2018   in [place]   "remove spur"   SPACE OAR INSTILLATION N/A  07/14/2018   Procedure: SPACE OAR INSTILLATION;  Surgeon: Kathie Rhodes, MD;  Location: [facility];  Service: Urology;  Laterality: N/A;   TEE WITHOUT CARDIOVERSION N/A 04/30/2019   Procedure: TRANSESOPHAGEAL ECHOCARDIOGRAM (TEE);  Surgeon: Grace Isaac, MD;  Location: [place];  Service: Open Heart Surgery;  Laterality: N/A;   Patient Active Problem List   Diagnosis Date Noted   Paresthesia of both feet 04/09/2021   Spinal stenosis, lumbar region, with neurogenic claudication 03/03/2021   Chronic radicular lumbar pain 03/03/2021   Lumbar  degenerative disc disease 03/03/2021   Lumbar facet arthropathy 03/03/2021   Chronic pain syndrome 03/03/2021   Iron deficiency anemia    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   Coronary artery disease 04/30/2019   Unstable angina ([place]) 04/27/2019   Malignant neoplasm of prostate ([place]) 04/09/2018   GERD (gastroesophageal reflux disease) 04/07/2018   Hx of colonic polyps 04/07/2018   Benign essential hypertension 12/02/2015   Hyperlipidemia, mixed 05/02/2015   Chronic painful diabetic neuropathy ([place]) 03/13/2015   DM (diabetes mellitus) type II controlled with renal manifestation ([place]) 08/08/2013    REFERRING DIAG:  \J03.009 (ICD-10-CM) - Neurogenic claudication due to lumbar spinal stenosis M51.16 (ICD-10-CM) - Lumbar disc herniation with radiculopathy   THERAPY DIAG:  Other low back pain  Difficulty in walking, not elsewhere classified  Rationale for Evaluation and Treatment Rehabilitation  PERTINENT HISTORY: Pt reports experiencing pain in his low back that spreads to his buttocks especially when standing up and walking. He is unable to walk for long periods of time because of back pain and he needs to take a break. He now uses a quad cane because of his balance. He describes a decline in his function after retiring at 76 years old. He to have a heart bypass.   PRECAUTIONS: Falls   SUBJECTIVE:                                                                                                                                                                                      SUBJECTIVE STATEMENT:  Pt reports increased low back pain because of the weather. He continues to do exercises which he thinks are helping strengthen his legs.   PAIN:  Are you having pain? Yes: NPRS scale: 5/10 Pain location: L4 CPA  Pain description: Achy  Aggravating factors: Waking up in morning  Relieving factors: Wearing back brace    OBJECTIVE: (objective measures completed at initial evaluation unless  otherwise dated)              VITALS: BP 185/79 HR 79    DIAGNOSTIC FINDINGS:  CLINICAL DATA:  Low back pain, pain  radiates down both legs. No known injury.   EXAM: MRI LUMBAR SPINE WITHOUT CONTRAST   TECHNIQUE: Multiplanar, multisequence MR imaging of the lumbar spine was performed. No intravenous contrast was administered.   COMPARISON:  12/16/2020   FINDINGS: Segmentation:  Standard.   Alignment:  Physiologic.   Vertebrae: No acute fracture, evidence of discitis, or aggressive bone lesion.   Conus medullaris and cauda equina: Conus extends to the L1 level. Conus and cauda equina appear normal.   Paraspinal and other soft tissues: No acute paraspinal abnormality.   Disc levels:   Disc spaces: Degenerative disease with disc height loss at L1-2, L2-3, L3-4, L4-5 and L5-S1.   T12-L1: No significant disc bulge. No neural foraminal stenosis. No central canal stenosis.   L1-L2: Mild broad-based disc bulge. Mild spinal stenosis. No foraminal stenosis.   L2-L3: Broad-based disc osteophyte complex flattening the ventral thecal sac. Mild spinal stenosis. No foraminal stenosis.   L3-L4: Broad-based disc osteophyte complex. Mild spinal stenosis. Mild bilateral facet arthropathy. No right foraminal stenosis. Mild left foraminal stenosis.   L4-L5: Broad-based disc osteophyte complex with a broad small central disc protrusion. Moderate bilateral facet arthropathy. Severe spinal stenosis. Severe left and moderate right foraminal stenosis.   L5-S1: Broad-based disc osteophyte complex. Moderate right and mild left facet arthropathy. Prominence of the epidural fat deforming the thecal sac as can be seen with epidural lipomatosis. No spinal stenosis. Moderate bilateral foraminal stenosis.   IMPRESSION: 1. At L4-5 there is a broad-based disc osteophyte complex with a broad small central disc protrusion. Moderate bilateral facet arthropathy. Severe spinal stenosis. Severe  left and moderate right foraminal stenosis. 2. At L5-S1 there is a broad-based disc osteophyte complex. Moderate right and mild left facet arthropathy. Prominence of the epidural fat deforming the thecal sac as can be seen with epidural lipomatosis. No spinal stenosis. Moderate bilateral foraminal stenosis. 3. Otherwise, lumbar spine spondylosis as described above. Overall, no significant interval change compared with the prior exam. 4.  No acute osseous injury of the lumbar spine.     Electronically Signed   By: Kathreen Devoid M.D.   On: 10/26/2021 14:42     PATIENT SURVEYS:  FOTO 54/100   SCREENING FOR RED FLAGS: Bowel or bladder incontinence: No Spinal tumors: No Cauda equina syndrome: No Compression fracture: No Abdominal aneurysm: No   COGNITION:           Overall cognitive status: Within functional limits for tasks assessed                          SENSATION: Neuropathy in feet    MUSCLE LENGTH: Hamstrings: Right 70 deg; Left 70 deg (muscular restriction in hamstrings) Thomas test: Negative bilaterally    POSTURE: No Significant postural limitations   PALPATION: L4-L5 central spinous process    LUMBAR ROM:    AROM eval  Flexion 75%*  Extension 50%*  Right lateral flexion 100%  Left lateral flexion 100%  Right rotation 100%  Left rotation 100%   (Blank rows = not tested)   LOWER EXTREMITY ROM:      Active  Right 11/18/2021 Left 11/18/2021  Hip flexion 120 120  Hip extension 30 30  Hip abduction 45 45  Hip adduction 30 30  Hip internal rotation 45 45  Hip external rotation 45 45  Knee flexion 135 135  Knee extension 0 0  Ankle dorsiflexion 20 20  Ankle plantarflexion 50 50  Ankle inversion  Ankle eversion       (Blank rows = not tested)        LOWER EXTREMITY MMT:     MMT Right eval Left eval  Hip flexion 5 5  Hip extension 3+ 3+  Hip abduction 5 5  Hip adduction 3+ 3+  Hip internal rotation      Hip external rotation       Knee flexion 5 5  Knee extension 5 5  Ankle dorsiflexion 5 5  Ankle plantarflexion      Ankle inversion      Ankle eversion       (Blank rows = not tested)   LUMBAR SPECIAL TESTS:  Straight leg raise test: Negative, FABER test: NT, Thomas test: Positive, and FADIR: NT    FUNCTIONAL TESTS:  5 times sit to stand: 22 sec  30 seconds chair stand test 6 minute walk test: 725 ft with quad cane  10 meter walk test: nt Dynamic Gait Index: 14/24   GAIT: Distance walked: 50 ft  Assistive device utilized: Quad cane large base Level of assistance: Modified independence Comments: Decreased step length and stance time       TODAY'S TREATMENT:  [facility] Adult PT Treatment:   DATE:  12/23/21:  THEREX   Nu-Step with seat and arm level at 8 for 5 min  Forward walk with red  banded resistance 10 ft x 6  Monster walks with red band for resistance 10 ft x 6  Good Mornings 3 x 10 with water jug  -mod VC for sequencing of exercise and hip hinge   OMEGA Knee Ext #20 3 x 10   Reviewed pre-surgical questions with patient via the following link: [email or web address]   12/21/21  Nu-Step with seat and arm level at 9 for 5 min  84mT:  750 ft -Do not use Quad cane  -NPS 7/10   FOTO Survey: 62  Sit to Stand with #8 DB 1 x 10 at 18 inch mat height   12/16/21             There.ex: Nu Step 5 min with 4 resistance for 5 min  Circuit exercises:     STS: x10    300' gait with 2# AW's      Hip abduction: 2# AW's, 2x10    300' gait with 2# AW's  Hip extension: 2# AW's, 2x10    300' gait with 2# AW's     Hip flexion: 2# AW's, 2x10     300' gait with 2# AW's   Seated rest b/t circuits exercises for recovery. Intermittent VC's for improved foot clearance. Good carryover.  PATIENT EDUCATION:  Education details: form and technique and explanation about deficits  Person educated: Patient Education method:  Explanation, Demonstration, Tactile cues, Verbal cues, and Handouts Education comprehension: verbalized understanding, returned demonstration, verbal cues required, and tactile cues required     HOME EXERCISE PROGRAM: Access Code: [email or web address] URL: [email or web address] Date: 12/09/2021 Prepared by: DBradly Chris Exercises - Supine Lower Trunk Rotation  - 1 x daily - 3 sets - 10 reps - Supine Single Knee to Chest Stretch  - 1 x daily - 2 sets - 10 reps - 3 hold - Prone Quadriceps Stretch with Strap  - 1 x daily - 3 reps - 60 hold - Sit to Stand with Arms Crossed  - 3 x weekly - 3 sets - 15 reps - Figure-8 Walking Around [place]  - 1 x daily - 10 reps - Curl  Up with Arms Crossed  - 3 x weekly - 3 sets - 10 reps - Standing Hip Abduction with Counter Support  - 3 x weekly - 3 sets - 10 reps -Standing Hip Adduction with BUE support 3 x weekly- 3 sets- 10 reps   ASSESSMENT:   CLINICAL IMPRESSION: Pt continues to show progress with LE strengthening with ability to perform monster walks and good mornings. He was somewhat limited by low back pain during exercises with NPS increase of 2 pts. PT educated pt on pre-surgical questions to gather information to make informed decision. He was receptive to questions. Pt will continue to benefit from skilled PT services to progress strength, gait and balance and reduced pain to optimize function and reduced risk of falls.    OBJECTIVE IMPAIRMENTS: Abnormal gait, decreased balance, decreased endurance, decreased mobility, difficulty walking, decreased ROM, decreased strength, hypomobility, impaired flexibility, impaired sensation, and pain.    ACTIVITY LIMITATIONS: carrying, lifting, bending, standing, squatting, stairs, and locomotion level   PARTICIPATION LIMITATIONS: cleaning, shopping, community activity, and yard work   PERSONAL FACTORS: Age, Fitness, Time since onset of injury/illness/exacerbation, and 3+ comorbidities: h/o of prostate  cancer, hypertension, T2DM  are also affecting patient's functional outcome.    REHAB POTENTIAL: Fair severity of symptoms and chronicity of condition    CLINICAL DECISION MAKING: Stable/uncomplicated   EVALUATION COMPLEXITY: Low     GOALS: Goals reviewed with patient? No   SHORT TERM GOALS: Target date: 12/02/2021   Pt will be independent with HEP in order to improve strength and balance in order to decrease fall risk and improve function at home and work.   Baseline: Completing independently  Goal status: Ongoing    2.  Pt will decrease 5TSTS by at least 3 seconds in order to demonstrate clinically significant improvement in LE strengt Baseline: 22 sec   12/09/21: 12 sec  Goal status: Achieved    3.  Pt will increase 6MWT by at least 658m(1697f in order to demonstrate clinically significant improvement in cardiopulmonary endurance and community ambulation Baseline: 725 ft with use of quad cane   12/21/21: 750 ft without quad cane  Goal status: Ongoing      LONG TERM GOALS: Target date: 01/27/2022   Patient will have improved function and activity level as evidenced by an increase in FOTO score by 10 points or more.  Baseline: 54/100 with target of 100  12/14/21: 57/100  12/21/21: 62/60 Goal status: Achieved    2.  Patient will perform 5 x STS in  <12 sec to demonstrate improved LE strength and to decrease risk of falling.   Baseline: 22 sec   12/09/21: 12 sec 12/21/21: 12 sec Goal status: Partially Met    3.  Patient will perform >=11 reps on 30 sec chair stands to demonstrate improve LE endurance and to decrease risk of falling  Baseline: 7 reps   12/09/21: 10 reps 12/21/21: 10 reps  Goal status: Partially Met    4.  Patient will demonstrate reduced falls risk as evidenced by Dynamic Gait Index (DGI) >19/24. Baseline: 14/24   12/14/21: 20/24 Goal status: Achieved    5.  Patient will ambulate >=1,000 ft during 58m60mto show improved aerobic endurance and ability to walk  distance that signifies he is a comHydrographic surveyoraseline: 725 ft with use of quad cane 12/21/21: 750 ft  Goal status: Partially Met      PLAN: PT FREQUENCY: 1-2x/week   PT DURATION: 10 weeks   PLANNED  INTERVENTIONS: Therapeutic exercises, Neuromuscular re-education, Balance training, Gait training, Patient/Family education, Self Care, Joint mobilization, Joint manipulation, Stair training, Vestibular training, DME instructions, Aquatic Therapy, Dry Needling, Electrical stimulation, Spinal manipulation, Spinal mobilization, Cryotherapy, Moist heat, Traction, Manual therapy, and Re-evaluation.   PLAN FOR NEXT SESSION: Continue with strengthening and endurance circuits: focus on endurance with sit to stands as well as quad and glute strengthening and walking for distance. Introduce RPE scale for pt to reach appropriate intensity while engaging in cardio workout.    Bradly Chris PT, DPT  Physical Therapist- [facility]  12/23/2021, 9:33 AM

## 2021-12-28 ENCOUNTER — Ambulatory Visit: Payer: PPO

## 2021-12-28 DIAGNOSIS — M5459 Other low back pain: Secondary | ICD-10-CM | POA: Diagnosis not present

## 2021-12-28 DIAGNOSIS — R262 Difficulty in walking, not elsewhere classified: Secondary | ICD-10-CM

## 2021-12-28 NOTE — Therapy (Signed)
OUTPATIENT PHYSICAL THERAPY PROGRESS NOTE   Patient Name: Cristian Martin MRN: [id number] DOB:1945/10/10, 76 y.o., male Today's Date: 12/28/2021 [facility]CP:  [facility] not [facility]  REFERRING PROVIDER: Dr. Meade Maw  END OF SESSION:   PT End of Session - 12/28/21 0848     Visit Number 12    Number of Visits 15    Date for PT Re-Evaluation 01/27/22    Authorization Type [employer]    Authorization Time Period 11/18/21-01/27/22    Authorization - Number of Visits 15    Progress Note Due on Visit 10    PT Start Time 0846    PT Stop Time 0930    PT Time Calculation (min) 44 min    Equipment Utilized During Treatment Gait belt;Back brace    Activity Tolerance Patient tolerated treatment well;Patient limited by pain    Behavior During Therapy WFL for tasks assessed/performed              Past Medical History:  Diagnosis Date   Agent orange exposure    Coronary artery disease    a. 04/2019 Cath: LM 99, LAD 80p, D2 70, LCX 50ost/p, RCA 70p, EF 55-65%; b. 04/2019 CABG x 3 (LIMA to LAD, SVG to OM1, SVG to RCA).   Diabetic peripheral neuropathy (HCC)    Diastolic dysfunction    a 04/2019 Echo: EF 55-60%, mod conc LVH, Gr1 DD. Nl RV fxn. Mild Ao sclerosis w/o stenosis.   GERD (gastroesophageal reflux disease)    Hyperlipidemia    Hyperplasia of prostate with lower urinary tract symptoms (LUTS)    Hypertension    Prostate cancer [facility]) urologist-- dr ottelin/  oncologist-- dr Tammi Klippel   dx 02-21-2018  -- Stage T1c, Gleason 3+4   Type 2 diabetes mellitus treated with insulin ([place])    followed by [facility] in [place]   Wears dentures    upper    Wears glasses    "wears to help double vision"   Past Surgical History:  Procedure Laterality Date   COLONOSCOPY WITH PROPOFOL N/A 08/16/2019   Procedure: COLONOSCOPY WITH PROPOFOL;  Surgeon: Lin Landsman, MD;  Location: [place];  Service: Gastroenterology;  Laterality: N/A;   CORONARY ARTERY BYPASS GRAFT N/A 04/30/2019    Procedure: CORONARY ARTERY BYPASS GRAFTING (CABG) TIMES THREE USING LEFT INTERNAL MAMMARY AND RIGHT GREATER SAPHENOUS VEIN HARVESTED ENDOSCOPICALLY. MAMMARY ARTERY TO LAD, SAPHENOUS VEIN GRAFT TO  OM1, SAPHENOUS VEING GRAFT TO RIGHT. BIOPSY OF MEDIASTINAL TISSUE PLACEMENT OF RIGHT FEMORAL A-LINE;  Surgeon: Grace Isaac, MD;  Location: [place];  Service: Open Heart Surgery;  Laterality: N/A;   CYSTOSCOPY N/A 07/14/2018   Procedure: CYSTOSCOPY;  Surgeon: Kathie Rhodes, MD;  Location: [facility];  Service: Urology;  Laterality: N/A;  no seeds in bladder per Dr Karsten Ro   ESOPHAGOGASTRODUODENOSCOPY (EGD) WITH PROPOFOL N/A 08/16/2019   Procedure: ESOPHAGOGASTRODUODENOSCOPY (EGD) WITH PROPOFOL;  Surgeon: Lin Landsman, MD;  Location: [place];  Service: Gastroenterology;  Laterality: N/A;   LEFT HEART CATH AND CORONARY ANGIOGRAPHY N/A 04/27/2019   Procedure: LEFT HEART CATH AND CORONARY ANGIOGRAPHY;  Surgeon: Wellington Hampshire, MD;  Location: [place] [facility];  Service: Cardiovascular;  Laterality: N/A;   PROSTATE BIOPSY  02-21-2018  dr Karsten Ro in office   RADIOACTIVE SEED IMPLANT N/A 07/14/2018   Procedure: RADIOACTIVE SEED IMPLANT/BRACHYTHERAPY IMPLANT;  Surgeon: Kathie Rhodes, MD;  Location: [place];  Service: Urology;  Laterality: N/A;  76 seeds    SHOULDER ARTHROSCOPY Left  2018   in [place]   "remove spur"   SPACE OAR INSTILLATION N/A 07/14/2018   Procedure: SPACE OAR INSTILLATION;  Surgeon: Kathie Rhodes, MD;  Location: [facility];  Service: Urology;  Laterality: N/A;   TEE WITHOUT CARDIOVERSION N/A 04/30/2019   Procedure: TRANSESOPHAGEAL ECHOCARDIOGRAM (TEE);  Surgeon: Grace Isaac, MD;  Location: [place];  Service: Open Heart Surgery;  Laterality: N/A;   Patient Active Problem List   Diagnosis Date Noted   Paresthesia of both feet 04/09/2021   Spinal stenosis, lumbar region, with neurogenic claudication 03/03/2021   Chronic  radicular lumbar pain 03/03/2021   Lumbar degenerative disc disease 03/03/2021   Lumbar facet arthropathy 03/03/2021   Chronic pain syndrome 03/03/2021   Iron deficiency anemia    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   Coronary artery disease 04/30/2019   Unstable angina ([place]) 04/27/2019   Malignant neoplasm of prostate ([place]) 04/09/2018   GERD (gastroesophageal reflux disease) 04/07/2018   Hx of colonic polyps 04/07/2018   Benign essential hypertension 12/02/2015   Hyperlipidemia, mixed 05/02/2015   Chronic painful diabetic neuropathy ([place]) 03/13/2015   DM (diabetes mellitus) type II controlled with renal manifestation ([place]) 08/08/2013    REFERRING DIAG:  \Y85.027 (ICD-10-CM) - Neurogenic claudication due to lumbar spinal stenosis M51.16 (ICD-10-CM) - Lumbar disc herniation with radiculopathy   THERAPY DIAG:  Other low back pain  Difficulty in walking, not elsewhere classified  Rationale for Evaluation and Treatment Rehabilitation  PERTINENT HISTORY: Pt reports experiencing pain in his low back that spreads to his buttocks especially when standing up and walking. He is unable to walk for long periods of time because of back pain and he needs to take a break. He now uses a quad cane because of his balance. He describes a decline in his function after retiring at 76 years old. He to have a heart bypass.   PRECAUTIONS: Falls   SUBJECTIVE:                                                                                                                                                                                      SUBJECTIVE STATEMENT:  Pt reports 5/10 pain near upper buttocks bilaterally. Fell Wednesday backwards. Able to get up with support of the bed with no injuries. Sees surgeon end of the month.   PAIN:  Are you having pain? Yes: NPRS scale: 5/10 Pain location: L4 CPA  Pain description: Achy  Aggravating factors: Waking up in morning  Relieving factors: Wearing back brace     OBJECTIVE: (objective measures completed at initial evaluation unless otherwise dated)  VITALS: BP 185/79 HR 79    DIAGNOSTIC FINDINGS:  CLINICAL DATA:  Low back pain, pain radiates down both legs. No known injury.   EXAM: MRI LUMBAR SPINE WITHOUT CONTRAST   TECHNIQUE: Multiplanar, multisequence MR imaging of the lumbar spine was performed. No intravenous contrast was administered.   COMPARISON:  12/16/2020   FINDINGS: Segmentation:  Standard.   Alignment:  Physiologic.   Vertebrae: No acute fracture, evidence of discitis, or aggressive bone lesion.   Conus medullaris and cauda equina: Conus extends to the L1 level. Conus and cauda equina appear normal.   Paraspinal and other soft tissues: No acute paraspinal abnormality.   Disc levels:   Disc spaces: Degenerative disease with disc height loss at L1-2, L2-3, L3-4, L4-5 and L5-S1.   T12-L1: No significant disc bulge. No neural foraminal stenosis. No central canal stenosis.   L1-L2: Mild broad-based disc bulge. Mild spinal stenosis. No foraminal stenosis.   L2-L3: Broad-based disc osteophyte complex flattening the ventral thecal sac. Mild spinal stenosis. No foraminal stenosis.   L3-L4: Broad-based disc osteophyte complex. Mild spinal stenosis. Mild bilateral facet arthropathy. No right foraminal stenosis. Mild left foraminal stenosis.   L4-L5: Broad-based disc osteophyte complex with a broad small central disc protrusion. Moderate bilateral facet arthropathy. Severe spinal stenosis. Severe left and moderate right foraminal stenosis.   L5-S1: Broad-based disc osteophyte complex. Moderate right and mild left facet arthropathy. Prominence of the epidural fat deforming the thecal sac as can be seen with epidural lipomatosis. No spinal stenosis. Moderate bilateral foraminal stenosis.   IMPRESSION: 1. At L4-5 there is a broad-based disc osteophyte complex with a broad small central disc  protrusion. Moderate bilateral facet arthropathy. Severe spinal stenosis. Severe left and moderate right foraminal stenosis. 2. At L5-S1 there is a broad-based disc osteophyte complex. Moderate right and mild left facet arthropathy. Prominence of the epidural fat deforming the thecal sac as can be seen with epidural lipomatosis. No spinal stenosis. Moderate bilateral foraminal stenosis. 3. Otherwise, lumbar spine spondylosis as described above. Overall, no significant interval change compared with the prior exam. 4.  No acute osseous injury of the lumbar spine.     Electronically Signed   By: Kathreen Devoid M.D.   On: 10/26/2021 14:42     PATIENT SURVEYS:  FOTO 54/100   SCREENING FOR RED FLAGS: Bowel or bladder incontinence: No Spinal tumors: No Cauda equina syndrome: No Compression fracture: No Abdominal aneurysm: No   COGNITION:           Overall cognitive status: Within functional limits for tasks assessed                          SENSATION: Neuropathy in feet    MUSCLE LENGTH: Hamstrings: Right 70 deg; Left 70 deg (muscular restriction in hamstrings) Thomas test: Negative bilaterally    POSTURE: No Significant postural limitations   PALPATION: L4-L5 central spinous process    LUMBAR ROM:    AROM eval  Flexion 75%*  Extension 50%*  Right lateral flexion 100%  Left lateral flexion 100%  Right rotation 100%  Left rotation 100%   (Blank rows = not tested)   LOWER EXTREMITY ROM:      Active  Right 11/18/2021 Left 11/18/2021  Hip flexion 120 120  Hip extension 30 30  Hip abduction 45 45  Hip adduction 30 30  Hip internal rotation 45 45  Hip external rotation 45 45  Knee flexion 135 135  Knee  extension 0 0  Ankle dorsiflexion 20 20  Ankle plantarflexion 50 50  Ankle inversion      Ankle eversion       (Blank rows = not tested)        LOWER EXTREMITY MMT:     MMT Right eval Left eval  Hip flexion 5 5  Hip extension 3+ 3+  Hip abduction 5 5   Hip adduction 3+ 3+  Hip internal rotation      Hip external rotation      Knee flexion 5 5  Knee extension 5 5  Ankle dorsiflexion 5 5  Ankle plantarflexion      Ankle inversion      Ankle eversion       (Blank rows = not tested)   LUMBAR SPECIAL TESTS:  Straight leg raise test: Negative, FABER test: NT, Thomas test: Positive, and FADIR: NT    FUNCTIONAL TESTS:  5 times sit to stand: 22 sec  30 seconds chair stand test 6 minute walk test: 725 ft with quad cane  10 meter walk test: nt Dynamic Gait Index: 14/24   GAIT: Distance walked: 50 ft  Assistive device utilized: Quad cane large base Level of assistance: Modified independence Comments: Decreased step length and stance time       TODAY'S TREATMENT:  [facility] Adult PT Treatment:   DATE:  12/28/21:  THEREX   Nu-Step with seat and arm level at 8 for 5 min L3 resistance: Warm up and gentle lumbar mobility  STS with feet on airex pad: 3x8, CGA. Notable AP ankle strategy    Neuro Re-ED:   Obstacle course: amb over unstable mat surface --> step over 3 hurdles --> airex step up and over --> alternating walking cone taps (5 cones) with CGA. X4 laps.   STS with ball toss with 2 KG med ball to challenge postural correction strategy: 2x8, tossing ball outside BOS with chair behind pt for safety. Reports easy.    Standing on airex pad 2 KG med ball toss on rebounder: 2x8, CGA. Intermittent hip strategy to correct LOB. Seated rest b/t bouts.   PATIENT EDUCATION:  Education details: form and technique and explanation about deficits  Person educated: Patient Education method: Explanation, Demonstration, Tactile cues, Verbal cues, and Handouts Education comprehension: verbalized understanding, returned demonstration, verbal cues required, and tactile cues required     HOME EXERCISE PROGRAM: Access Code: [email or web address] URL: [email or web address] Date: 12/09/2021 Prepared by: Bradly Chris  Exercises - Supine  Lower Trunk Rotation  - 1 x daily - 3 sets - 10 reps - Supine Single Knee to Chest Stretch  - 1 x daily - 2 sets - 10 reps - 3 hold - Prone Quadriceps Stretch with Strap  - 1 x daily - 3 reps - 60 hold - Sit to Stand with Arms Crossed  - 3 x weekly - 3 sets - 15 reps - Figure-8 Walking Around Cones  - 1 x daily - 10 reps - Curl Up with Arms Crossed  - 3 x weekly - 3 sets - 10 reps - Standing Hip Abduction with Counter Support  - 3 x weekly - 3 sets - 10 reps -Standing Hip Adduction with BUE support 3 x weekly- 3 sets- 10 reps   ASSESSMENT:   CLINICAL IMPRESSION: Continuing PT POC with focus on dynamic balance and strengthening. Pt in agreement he needs additional balance therapy due to recent falls. Has a DGI goal but pt may warrant FGA for better understanding of  balance deficits. Notable difficulty with SLS tasks and unstable surfaces with frequent stepping strategy to perform. Pt will continue to benefit from skilled PT services to progress strength, gait and balance and reduced pain to optimize function and reduced risk of falls.    OBJECTIVE IMPAIRMENTS: Abnormal gait, decreased balance, decreased endurance, decreased mobility, difficulty walking, decreased ROM, decreased strength, hypomobility, impaired flexibility, impaired sensation, and pain.    ACTIVITY LIMITATIONS: carrying, lifting, bending, standing, squatting, stairs, and locomotion level   PARTICIPATION LIMITATIONS: cleaning, shopping, community activity, and yard work   PERSONAL FACTORS: Age, Fitness, Time since onset of injury/illness/exacerbation, and 3+ comorbidities: h/o of prostate cancer, hypertension, T2DM  are also affecting patient's functional outcome.    REHAB POTENTIAL: Fair severity of symptoms and chronicity of condition    CLINICAL DECISION MAKING: Stable/uncomplicated   EVALUATION COMPLEXITY: Low     GOALS: Goals reviewed with patient? No   SHORT TERM GOALS: Target date: 12/02/2021   Pt will be  independent with HEP in order to improve strength and balance in order to decrease fall risk and improve function at home and work.   Baseline: Completing independently  Goal status: Ongoing    2.  Pt will decrease 5TSTS by at least 3 seconds in order to demonstrate clinically significant improvement in LE strengt Baseline: 22 sec   12/09/21: 12 sec  Goal status: Achieved    3.  Pt will increase 6MWT by at least 19m(1677f in order to demonstrate clinically significant improvement in cardiopulmonary endurance and community ambulation Baseline: 725 ft with use of quad cane   12/21/21: 750 ft without quad cane  Goal status: Ongoing      LONG TERM GOALS: Target date: 01/27/2022   Patient will have improved function and activity level as evidenced by an increase in FOTO score by 10 points or more.  Baseline: 54/100 with target of 100  12/14/21: 57/100  12/21/21: 62/60 Goal status: Achieved    2.  Patient will perform 5 x STS in  <12 sec to demonstrate improved LE strength and to decrease risk of falling.   Baseline: 22 sec   12/09/21: 12 sec 12/21/21: 12 sec Goal status: Partially Met    3.  Patient will perform >=11 reps on 30 sec chair stands to demonstrate improve LE endurance and to decrease risk of falling  Baseline: 7 reps   12/09/21: 10 reps 12/21/21: 10 reps  Goal status: Partially Met    4.  Patient will demonstrate reduced falls risk as evidenced by Dynamic Gait Index (DGI) >19/24. Baseline: 14/24   12/14/21: 20/24 Goal status: Achieved    5.  Patient will ambulate >=1,000 ft during 32m21mto show improved aerobic endurance and ability to walk distance that signifies he is a comHydrographic surveyoraseline: 725 ft with use of quad cane 12/21/21: 750 ft  Goal status: Partially Met      PLAN: PT FREQUENCY: 1-2x/week   PT DURATION: 10 weeks   PLANNED INTERVENTIONS: Therapeutic exercises, Neuromuscular re-education, Balance training, Gait training, Patient/Family education, Self  Care, Joint mobilization, Joint manipulation, Stair training, Vestibular training, DME instructions, Aquatic Therapy, Dry Needling, Electrical stimulation, Spinal manipulation, Spinal mobilization, Cryotherapy, Moist heat, Traction, Manual therapy, and Re-evaluation.   PLAN FOR NEXT SESSION: Continue with strengthening and endurance circuits: focus on endurance with sit to stands as well as quad and glute strengthening and walking for distance. Introduce RPE scale for pt to reach appropriate intensity while engaging in cardio workout.  Salem Caster. Fairly IV, PT, DPT Physical Therapist- [facility]  12/28/2021, 9:55 AM

## 2021-12-30 ENCOUNTER — Ambulatory Visit: Payer: PPO

## 2021-12-30 DIAGNOSIS — R262 Difficulty in walking, not elsewhere classified: Secondary | ICD-10-CM

## 2021-12-30 DIAGNOSIS — M5459 Other low back pain: Secondary | ICD-10-CM | POA: Diagnosis not present

## 2021-12-30 NOTE — Therapy (Signed)
OUTPATIENT PHYSICAL THERAPY TREATMENT NOTE   Patient Name: Cristian Martin MRN: [id number] DOB:October 05, 1945, 76 y.o., male Today's Date: 12/30/2021 [facility]CP:  [facility] not [facility]  REFERRING PROVIDER: Dr. Meade Maw  END OF SESSION:   PT End of Session - 12/30/21 0938     Visit Number 13    Number of Visits 15    Date for PT Re-Evaluation 01/27/22    Authorization Type [employer]    Authorization Time Period 11/18/21-01/27/22    Authorization - Visit Number 13    Authorization - Number of Visits 15    Progress Note Due on Visit 10    PT Start Time 0847    PT Stop Time 0930    PT Time Calculation (min) 43 min    Equipment Utilized During Treatment Gait belt;Back brace    Activity Tolerance Patient tolerated treatment well    Behavior During Therapy WFL for tasks assessed/performed              Past Medical History:  Diagnosis Date   Agent orange exposure    Coronary artery disease    a. 04/2019 Cath: LM 99, LAD 80p, D2 70, LCX 50ost/p, RCA 70p, EF 55-65%; b. 04/2019 CABG x 3 (LIMA to LAD, SVG to OM1, SVG to RCA).   Diabetic peripheral neuropathy (HCC)    Diastolic dysfunction    a 04/2019 Echo: EF 55-60%, mod conc LVH, Gr1 DD. Nl RV fxn. Mild Ao sclerosis w/o stenosis.   GERD (gastroesophageal reflux disease)    Hyperlipidemia    Hyperplasia of prostate with lower urinary tract symptoms (LUTS)    Hypertension    Prostate cancer [facility]) urologist-- dr ottelin/  oncologist-- dr Tammi Klippel   dx 02-21-2018  -- Stage T1c, Gleason 3+4   Type 2 diabetes mellitus treated with insulin ([place])    followed by [facility] in [place]   Wears dentures    upper    Wears glasses    "wears to help double vision"   Past Surgical History:  Procedure Laterality Date   COLONOSCOPY WITH PROPOFOL N/A 08/16/2019   Procedure: COLONOSCOPY WITH PROPOFOL;  Surgeon: Lin Landsman, MD;  Location: [place];  Service: Gastroenterology;  Laterality: N/A;   CORONARY ARTERY BYPASS GRAFT  N/A 04/30/2019   Procedure: CORONARY ARTERY BYPASS GRAFTING (CABG) TIMES THREE USING LEFT INTERNAL MAMMARY AND RIGHT GREATER SAPHENOUS VEIN HARVESTED ENDOSCOPICALLY. MAMMARY ARTERY TO LAD, SAPHENOUS VEIN GRAFT TO  OM1, SAPHENOUS VEING GRAFT TO RIGHT. BIOPSY OF MEDIASTINAL TISSUE PLACEMENT OF RIGHT FEMORAL A-LINE;  Surgeon: Grace Isaac, MD;  Location: [place];  Service: Open Heart Surgery;  Laterality: N/A;   CYSTOSCOPY N/A 07/14/2018   Procedure: CYSTOSCOPY;  Surgeon: Kathie Rhodes, MD;  Location: [facility];  Service: Urology;  Laterality: N/A;  no seeds in bladder per Dr Karsten Ro   ESOPHAGOGASTRODUODENOSCOPY (EGD) WITH PROPOFOL N/A 08/16/2019   Procedure: ESOPHAGOGASTRODUODENOSCOPY (EGD) WITH PROPOFOL;  Surgeon: Lin Landsman, MD;  Location: [place];  Service: Gastroenterology;  Laterality: N/A;   LEFT HEART CATH AND CORONARY ANGIOGRAPHY N/A 04/27/2019   Procedure: LEFT HEART CATH AND CORONARY ANGIOGRAPHY;  Surgeon: Wellington Hampshire, MD;  Location: [facility];  Service: Cardiovascular;  Laterality: N/A;   PROSTATE BIOPSY  02-21-2018  dr Karsten Ro in office   RADIOACTIVE SEED IMPLANT N/A 07/14/2018   Procedure: RADIOACTIVE SEED IMPLANT/BRACHYTHERAPY IMPLANT;  Surgeon: Kathie Rhodes, MD;  Location: [place];  Service: Urology;  Laterality: N/A;  76 seeds  SHOULDER ARTHROSCOPY Left 2018   in [facility]   "remove spur"   SPACE OAR INSTILLATION N/A 07/14/2018   Procedure: SPACE OAR INSTILLATION;  Surgeon: Kathie Rhodes, MD;  Location: [facility];  Service: Urology;  Laterality: N/A;   TEE WITHOUT CARDIOVERSION N/A 04/30/2019   Procedure: TRANSESOPHAGEAL ECHOCARDIOGRAM (TEE);  Surgeon: Grace Isaac, MD;  Location: [place];  Service: Open Heart Surgery;  Laterality: N/A;   Patient Active Problem List   Diagnosis Date Noted   Paresthesia of both feet 04/09/2021   Spinal stenosis, lumbar region, with neurogenic claudication  03/03/2021   Chronic radicular lumbar pain 03/03/2021   Lumbar degenerative disc disease 03/03/2021   Lumbar facet arthropathy 03/03/2021   Chronic pain syndrome 03/03/2021   Iron deficiency anemia    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   Coronary artery disease 04/30/2019   Unstable angina ([place]) 04/27/2019   Malignant neoplasm of prostate ([place]) 04/09/2018   GERD (gastroesophageal reflux disease) 04/07/2018   Hx of colonic polyps 04/07/2018   Benign essential hypertension 12/02/2015   Hyperlipidemia, mixed 05/02/2015   Chronic painful diabetic neuropathy ([place]) 03/13/2015   DM (diabetes mellitus) type II controlled with renal manifestation ([place]) 08/08/2013    REFERRING DIAG:  \A12.878 (ICD-10-CM) - Neurogenic claudication due to lumbar spinal stenosis M51.16 (ICD-10-CM) - Lumbar disc herniation with radiculopathy   THERAPY DIAG:  Other low back pain  Difficulty in walking, not elsewhere classified  Rationale for Evaluation and Treatment Rehabilitation  PERTINENT HISTORY: Pt reports experiencing pain in his low back that spreads to his buttocks especially when standing up and walking. He is unable to walk for long periods of time because of back pain and he needs to take a break. He now uses a quad cane because of his balance. He describes a decline in his function after retiring at 76 years old. He to have a heart bypass.   PRECAUTIONS: Falls   SUBJECTIVE:                                                                                                                                                                                      SUBJECTIVE STATEMENT:  Pt reports no other complaints. Maintained standard level of LBP and stiffness due to cold weather. No falls.   PAIN:  Are you having pain? Yes: NPRS scale: 5/10 Pain location: L4 CPA  Pain description: Achy  Aggravating factors: Waking up in morning  Relieving factors: Wearing back brace    OBJECTIVE: (objective measures completed  at initial evaluation unless otherwise dated)              VITALS: BP 185/79 HR 79  DIAGNOSTIC FINDINGS:  CLINICAL DATA:  Low back pain, pain radiates down both legs. No known injury.   EXAM: MRI LUMBAR SPINE WITHOUT CONTRAST   TECHNIQUE: Multiplanar, multisequence MR imaging of the lumbar spine was performed. No intravenous contrast was administered.   COMPARISON:  12/16/2020   FINDINGS: Segmentation:  Standard.   Alignment:  Physiologic.   Vertebrae: No acute fracture, evidence of discitis, or aggressive bone lesion.   Conus medullaris and cauda equina: Conus extends to the L1 level. Conus and cauda equina appear normal.   Paraspinal and other soft tissues: No acute paraspinal abnormality.   Disc levels:   Disc spaces: Degenerative disease with disc height loss at L1-2, L2-3, L3-4, L4-5 and L5-S1.   T12-L1: No significant disc bulge. No neural foraminal stenosis. No central canal stenosis.   L1-L2: Mild broad-based disc bulge. Mild spinal stenosis. No foraminal stenosis.   L2-L3: Broad-based disc osteophyte complex flattening the ventral thecal sac. Mild spinal stenosis. No foraminal stenosis.   L3-L4: Broad-based disc osteophyte complex. Mild spinal stenosis. Mild bilateral facet arthropathy. No right foraminal stenosis. Mild left foraminal stenosis.   L4-L5: Broad-based disc osteophyte complex with a broad small central disc protrusion. Moderate bilateral facet arthropathy. Severe spinal stenosis. Severe left and moderate right foraminal stenosis.   L5-S1: Broad-based disc osteophyte complex. Moderate right and mild left facet arthropathy. Prominence of the epidural fat deforming the thecal sac as can be seen with epidural lipomatosis. No spinal stenosis. Moderate bilateral foraminal stenosis.   IMPRESSION: 1. At L4-5 there is a broad-based disc osteophyte complex with a broad small central disc protrusion. Moderate bilateral facet arthropathy.  Severe spinal stenosis. Severe left and moderate right foraminal stenosis. 2. At L5-S1 there is a broad-based disc osteophyte complex. Moderate right and mild left facet arthropathy. Prominence of the epidural fat deforming the thecal sac as can be seen with epidural lipomatosis. No spinal stenosis. Moderate bilateral foraminal stenosis. 3. Otherwise, lumbar spine spondylosis as described above. Overall, no significant interval change compared with the prior exam. 4.  No acute osseous injury of the lumbar spine.     Electronically Signed   By: Kathreen Devoid M.D.   On: 10/26/2021 14:42     PATIENT SURVEYS:  FOTO 54/100   SCREENING FOR RED FLAGS: Bowel or bladder incontinence: No Spinal tumors: No Cauda equina syndrome: No Compression fracture: No Abdominal aneurysm: No   COGNITION:           Overall cognitive status: Within functional limits for tasks assessed                          SENSATION: Neuropathy in feet    MUSCLE LENGTH: Hamstrings: Right 70 deg; Left 70 deg (muscular restriction in hamstrings) Thomas test: Negative bilaterally    POSTURE: No Significant postural limitations   PALPATION: L4-L5 central spinous process    LUMBAR ROM:    AROM eval  Flexion 75%*  Extension 50%*  Right lateral flexion 100%  Left lateral flexion 100%  Right rotation 100%  Left rotation 100%   (Blank rows = not tested)   LOWER EXTREMITY ROM:      Active  Right 11/18/2021 Left 11/18/2021  Hip flexion 120 120  Hip extension 30 30  Hip abduction 45 45  Hip adduction 30 30  Hip internal rotation 45 45  Hip external rotation 45 45  Knee flexion 135 135  Knee extension 0 0  Ankle dorsiflexion 20 20  Ankle plantarflexion 50 50  Ankle inversion      Ankle eversion       (Blank rows = not tested)        LOWER EXTREMITY MMT:     MMT Right eval Left eval  Hip flexion 5 5  Hip extension 3+ 3+  Hip abduction 5 5  Hip adduction 3+ 3+  Hip internal rotation       Hip external rotation      Knee flexion 5 5  Knee extension 5 5  Ankle dorsiflexion 5 5  Ankle plantarflexion      Ankle inversion      Ankle eversion       (Blank rows = not tested)   LUMBAR SPECIAL TESTS:  Straight leg raise test: Negative, FABER test: NT, Thomas test: Positive, and FADIR: NT    FUNCTIONAL TESTS:  5 times sit to stand: 22 sec  30 seconds chair stand test 6 minute walk test: 725 ft with quad cane  10 meter walk test: nt Dynamic Gait Index: 14/24   GAIT: Distance walked: 50 ft  Assistive device utilized: Quad cane large base Level of assistance: Modified independence Comments: Decreased step length and stance time       TODAY'S TREATMENT:  [facility] Adult PT Treatment:   DATE:  12/30/21:  Neuro Re-ED:  5 minutes on Nu -step L3 for lumbar and LE warm up and mobility.    Performance of FGA noted below to assess risk of falls    [facility] PT Assessment - 12/30/21 0905       Functional Gait  Assessment   Gait assessed  Yes    Gait Level Surface Walks 20 ft, slow speed, abnormal gait pattern, evidence for imbalance or deviates 10-15 in outside of the 12 in walkway width. Requires more than 7 sec to ambulate 20 ft.    Change in Gait Speed Able to change speed, demonstrates mild gait deviations, deviates 6-10 in outside of the 12 in walkway width, or no gait deviations, unable to achieve a major change in velocity, or uses a change in velocity, or uses an assistive device.    Gait with Horizontal Head Turns Performs head turns smoothly with slight change in gait velocity (eg, minor disruption to smooth gait path), deviates 6-10 in outside 12 in walkway width, or uses an assistive device.    Gait with Vertical Head Turns Performs task with slight change in gait velocity (eg, minor disruption to smooth gait path), deviates 6 - 10 in outside 12 in walkway width or uses assistive device    Gait and Pivot Turn Pivot turns safely in greater than 3 sec and stops with no  loss of balance, or pivot turns safely within 3 sec and stops with mild imbalance, requires small steps to catch balance.    Step Over Obstacle Is able to step over one shoe box (4.5 in total height) without changing gait speed. No evidence of imbalance.    Gait with Narrow Base of Support Ambulates less than 4 steps heel to toe or cannot perform without assistance.    Gait with Eyes Closed Walks 20 ft, slow speed, abnormal gait pattern, evidence for imbalance, deviates 10-15 in outside 12 in walkway width. Requires more than 9 sec to ambulate 20 ft.    Ambulating Backwards Walks 20 ft, uses assistive device, slower speed, mild gait deviations, deviates 6-10 in outside 12 in walkway width.    Steps Two feet to a stair,  must use rail.    Total Score 15             Long discussion on FGA score indicating increased risk for falls. Focus of POC on dynamic balance since pt reports feeling stronger to prevent serious injury as pt has had 6 falls.   Gait around gym with dual tasking of head turns randomly calling out horizontal (R/L) and vertical (superior inferior), changes in gait speed. ~500' with notable variable step lengths throughout primarily with transitions from vertical to horizontal.    Obstacle course x6 laps focusing on step overs: small 1/2 bolster --> shoe box --> large 1/2 bolster --> airex foam beam with items various distances apart to challenge proprioceptive understanding of obstacle navigation.    PATIENT EDUCATION:  Education details: form and technique and explanation about deficits  Person educated: Patient Education method: Explanation, Demonstration, Tactile cues, Verbal cues, and Handouts Education comprehension: verbalized understanding, returned demonstration, verbal cues required, and tactile cues required     HOME EXERCISE PROGRAM: Access Code: [email or web address] URL: [email or web address] Date: 12/09/2021 Prepared by: Bradly Chris  Exercises - Supine  Lower Trunk Rotation  - 1 x daily - 3 sets - 10 reps - Supine Single Knee to Chest Stretch  - 1 x daily - 2 sets - 10 reps - 3 hold - Prone Quadriceps Stretch with Strap  - 1 x daily - 3 reps - 60 hold - Sit to Stand with Arms Crossed  - 3 x weekly - 3 sets - 15 reps - Figure-8 Walking Around [place]  - 1 x daily - 10 reps - Curl Up with Arms Crossed  - 3 x weekly - 3 sets - 10 reps - Standing Hip Abduction with Counter Support  - 3 x weekly - 3 sets - 10 reps -Standing Hip Adduction with BUE support 3 x weekly- 3 sets- 10 reps   ASSESSMENT:   CLINICAL IMPRESSION: Focus of POC on further assessing pt's falls risk. Pt scoring a 15/30 on FGA indicating a high falls risk. Pt agreeable to future sessions focusing on dynamic balance due to increased falls frequency as pt states this is his biggest concern is preventing falls. Pt most notably scoring poorly on FGA with timed tasks, narrowed BOS, stairs, and head turns. Pt will continue to benefit from skilled PT services to progress strength, gait and balance and reduced pain to optimize function and reduced risk of falls.    OBJECTIVE IMPAIRMENTS: Abnormal gait, decreased balance, decreased endurance, decreased mobility, difficulty walking, decreased ROM, decreased strength, hypomobility, impaired flexibility, impaired sensation, and pain.    ACTIVITY LIMITATIONS: carrying, lifting, bending, standing, squatting, stairs, and locomotion level   PARTICIPATION LIMITATIONS: cleaning, shopping, community activity, and yard work   PERSONAL FACTORS: Age, Fitness, Time since onset of injury/illness/exacerbation, and 3+ comorbidities: h/o of prostate cancer, hypertension, T2DM  are also affecting patient's functional outcome.    REHAB POTENTIAL: Fair severity of symptoms and chronicity of condition    CLINICAL DECISION MAKING: Stable/uncomplicated   EVALUATION COMPLEXITY: Low     GOALS: Goals reviewed with patient? No   SHORT TERM GOALS: Target date:  12/02/2021   Pt will be independent with HEP in order to improve strength and balance in order to decrease fall risk and improve function at home and work.   Baseline: Completing independently  Goal status: Ongoing    2.  Pt will decrease 5TSTS by at least 3 seconds in order to demonstrate clinically significant improvement in LE strengt  Baseline: 22 sec   12/09/21: 12 sec  Goal status: Achieved    3.  Pt will increase 6MWT by at least 49m(1641f in order to demonstrate clinically significant improvement in cardiopulmonary endurance and community ambulation Baseline: 725 ft with use of quad cane   12/21/21: 750 ft without quad cane  Goal status: Ongoing      LONG TERM GOALS: Target date: 01/27/2022   Patient will have improved function and activity level as evidenced by an increase in FOTO score by 10 points or more.  Baseline: 54/100 with target of 100  12/14/21: 57/100  12/21/21: 62/60 Goal status: Achieved    2.  Patient will perform 5 x STS in  <12 sec to demonstrate improved LE strength and to decrease risk of falling.   Baseline: 22 sec   12/09/21: 12 sec 12/21/21: 12 sec Goal status: Partially Met    3.  Patient will perform >=11 reps on 30 sec chair stands to demonstrate improve LE endurance and to decrease risk of falling  Baseline: 7 reps   12/09/21: 10 reps 12/21/21: 10 reps  Goal status: Partially Met    4.  Patient will demonstrate reduced falls risk as evidenced by Dynamic Gait Index (DGI) >19/24. Baseline: 14/24   12/14/21: 20/24 Goal status: Achieved    5.  Patient will ambulate >=1,000 ft during 7m31mto show improved aerobic endurance and ability to walk distance that signifies he is a comHydrographic surveyoraseline: 725 ft with use of quad cane 12/21/21: 750 ft  Goal status: Partially Met      PLAN: PT FREQUENCY: 1-2x/week   PT DURATION: 10 weeks   PLANNED INTERVENTIONS: Therapeutic exercises, Neuromuscular re-education, Balance training, Gait training,  Patient/Family education, Self Care, Joint mobilization, Joint manipulation, Stair training, Vestibular training, DME instructions, Aquatic Therapy, Dry Needling, Electrical stimulation, Spinal manipulation, Spinal mobilization, Cryotherapy, Moist heat, Traction, Manual therapy, and Re-evaluation.   PLAN FOR NEXT SESSION: Dynamic balance, unstable surfaces, reduced seated breaks for endurance.    MilSalem Casterairly IV, PT, DPT Physical Therapist- [facility]1/29/2023, 9:55 AM

## 2022-01-04 ENCOUNTER — Ambulatory Visit: Payer: PPO | Attending: Neurosurgery

## 2022-01-04 DIAGNOSIS — M5459 Other low back pain: Secondary | ICD-10-CM | POA: Insufficient documentation

## 2022-01-04 DIAGNOSIS — R262 Difficulty in walking, not elsewhere classified: Secondary | ICD-10-CM | POA: Diagnosis present

## 2022-01-04 NOTE — Therapy (Signed)
OUTPATIENT PHYSICAL THERAPY TREATMENT NOTE   Patient Name: Cristian Martin MRN: [id number] DOB:1945-06-14, 76 y.o., male Today's Date: 01/04/2022 [facility]CP:  [facility] not [facility]  REFERRING PROVIDER: Dr. Meade Maw  END OF SESSION:   PT End of Session - 01/04/22 1326     Visit Number 14    Number of Visits 15    Date for PT Re-Evaluation 01/27/22    Authorization Type [employer]    Authorization Time Period 11/18/21-01/27/22    Authorization - Visit Number 14    Authorization - Number of Visits 15    Progress Note Due on Visit 10    PT Start Time 1329    PT Stop Time 1414    PT Time Calculation (min) 45 min    Equipment Utilized During Treatment Gait belt;Back brace    Activity Tolerance Patient tolerated treatment well    Behavior During Therapy WFL for tasks assessed/performed              Past Medical History:  Diagnosis Date   Agent orange exposure    Coronary artery disease    a. 04/2019 Cath: LM 99, LAD 80p, D2 70, LCX 50ost/p, RCA 70p, EF 55-65%; b. 04/2019 CABG x 3 (LIMA to LAD, SVG to OM1, SVG to RCA).   Diabetic peripheral neuropathy (HCC)    Diastolic dysfunction    a 04/2019 Echo: EF 55-60%, mod conc LVH, Gr1 DD. Nl RV fxn. Mild Ao sclerosis w/o stenosis.   GERD (gastroesophageal reflux disease)    Hyperlipidemia    Hyperplasia of prostate with lower urinary tract symptoms (LUTS)    Hypertension    Prostate cancer [facility]) urologist-- dr ottelin/  oncologist-- dr Tammi Klippel   dx 02-21-2018  -- Stage T1c, Gleason 3+4   Type 2 diabetes mellitus treated with insulin ([place])    followed by [place] in [place]   Wears dentures    upper    Wears glasses    "wears to help double vision"   Past Surgical History:  Procedure Laterality Date   COLONOSCOPY WITH PROPOFOL N/A 08/16/2019   Procedure: COLONOSCOPY WITH PROPOFOL;  Surgeon: Lin Landsman, MD;  Location: [place];  Service: Gastroenterology;  Laterality: N/A;   CORONARY ARTERY BYPASS GRAFT N/A  04/30/2019   Procedure: CORONARY ARTERY BYPASS GRAFTING (CABG) TIMES THREE USING LEFT INTERNAL MAMMARY AND RIGHT GREATER SAPHENOUS VEIN HARVESTED ENDOSCOPICALLY. MAMMARY ARTERY TO LAD, SAPHENOUS VEIN GRAFT TO  OM1, SAPHENOUS VEING GRAFT TO RIGHT. BIOPSY OF MEDIASTINAL TISSUE PLACEMENT OF RIGHT FEMORAL A-LINE;  Surgeon: Grace Isaac, MD;  Location: [place];  Service: Open Heart Surgery;  Laterality: N/A;   CYSTOSCOPY N/A 07/14/2018   Procedure: CYSTOSCOPY;  Surgeon: Kathie Rhodes, MD;  Location: [facility];  Service: Urology;  Laterality: N/A;  no seeds in bladder per Dr Karsten Ro   ESOPHAGOGASTRODUODENOSCOPY (EGD) WITH PROPOFOL N/A 08/16/2019   Procedure: ESOPHAGOGASTRODUODENOSCOPY (EGD) WITH PROPOFOL;  Surgeon: Lin Landsman, MD;  Location: [place];  Service: Gastroenterology;  Laterality: N/A;   LEFT HEART CATH AND CORONARY ANGIOGRAPHY N/A 04/27/2019   Procedure: LEFT HEART CATH AND CORONARY ANGIOGRAPHY;  Surgeon: Wellington Hampshire, MD;  Location: [place] [facility];  Service: Cardiovascular;  Laterality: N/A;   PROSTATE BIOPSY  02-21-2018  dr Karsten Ro in office   RADIOACTIVE SEED IMPLANT N/A 07/14/2018   Procedure: RADIOACTIVE SEED IMPLANT/BRACHYTHERAPY IMPLANT;  Surgeon: Kathie Rhodes, MD;  Location: [place];  Service: Urology;  Laterality: N/A;  76 seeds  SHOULDER ARTHROSCOPY Left 2018   in [place]   "remove spur"   SPACE OAR INSTILLATION N/A 07/14/2018   Procedure: SPACE OAR INSTILLATION;  Surgeon: Kathie Rhodes, MD;  Location: [facility];  Service: Urology;  Laterality: N/A;   TEE WITHOUT CARDIOVERSION N/A 04/30/2019   Procedure: TRANSESOPHAGEAL ECHOCARDIOGRAM (TEE);  Surgeon: Grace Isaac, MD;  Location: [place];  Service: Open Heart Surgery;  Laterality: N/A;   Patient Active Problem List   Diagnosis Date Noted   Paresthesia of both feet 04/09/2021   Spinal stenosis, lumbar region, with neurogenic claudication 03/03/2021    Chronic radicular lumbar pain 03/03/2021   Lumbar degenerative disc disease 03/03/2021   Lumbar facet arthropathy 03/03/2021   Chronic pain syndrome 03/03/2021   Iron deficiency anemia    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   Coronary artery disease 04/30/2019   Unstable angina ([place]) 04/27/2019   Malignant neoplasm of prostate ([place]) 04/09/2018   GERD (gastroesophageal reflux disease) 04/07/2018   Hx of colonic polyps 04/07/2018   Benign essential hypertension 12/02/2015   Hyperlipidemia, mixed 05/02/2015   Chronic painful diabetic neuropathy ([place]) 03/13/2015   DM (diabetes mellitus) type II controlled with renal manifestation ([place]) 08/08/2013    REFERRING DIAG:  \Y81.448 (ICD-10-CM) - Neurogenic claudication due to lumbar spinal stenosis M51.16 (ICD-10-CM) - Lumbar disc herniation with radiculopathy   THERAPY DIAG:  Other low back pain  Difficulty in walking, not elsewhere classified  Rationale for Evaluation and Treatment Rehabilitation  PERTINENT HISTORY: Pt reports experiencing pain in his low back that spreads to his buttocks especially when standing up and walking. He is unable to walk for long periods of time because of back pain and he needs to take a break. He now uses a quad cane because of his balance. He describes a decline in his function after retiring at 76 years old. He to have a heart bypass.   PRECAUTIONS: Falls   SUBJECTIVE:                                                                                                                                                                                      SUBJECTIVE STATEMENT:  Pt reports 7/10 NPS in low back due to toting trash can to street. No falls, had near fall stepping up onto curb but had QC to help assist.   PAIN:  Are you having pain? Yes: NPRS scale: 7/10 Pain location: L4 CPA  Pain description: Achy  Aggravating factors: Waking up in morning  Relieving factors: Wearing back brace    OBJECTIVE: (objective  measures completed at initial evaluation unless otherwise dated)  VITALS: BP 185/79 HR 79    DIAGNOSTIC FINDINGS:  CLINICAL DATA:  Low back pain, pain radiates down both legs. No known injury.   EXAM: MRI LUMBAR SPINE WITHOUT CONTRAST   TECHNIQUE: Multiplanar, multisequence MR imaging of the lumbar spine was performed. No intravenous contrast was administered.   COMPARISON:  12/16/2020   FINDINGS: Segmentation:  Standard.   Alignment:  Physiologic.   Vertebrae: No acute fracture, evidence of discitis, or aggressive bone lesion.   Conus medullaris and cauda equina: Conus extends to the L1 level. Conus and cauda equina appear normal.   Paraspinal and other soft tissues: No acute paraspinal abnormality.   Disc levels:   Disc spaces: Degenerative disease with disc height loss at L1-2, L2-3, L3-4, L4-5 and L5-S1.   T12-L1: No significant disc bulge. No neural foraminal stenosis. No central canal stenosis.   L1-L2: Mild broad-based disc bulge. Mild spinal stenosis. No foraminal stenosis.   L2-L3: Broad-based disc osteophyte complex flattening the ventral thecal sac. Mild spinal stenosis. No foraminal stenosis.   L3-L4: Broad-based disc osteophyte complex. Mild spinal stenosis. Mild bilateral facet arthropathy. No right foraminal stenosis. Mild left foraminal stenosis.   L4-L5: Broad-based disc osteophyte complex with a broad small central disc protrusion. Moderate bilateral facet arthropathy. Severe spinal stenosis. Severe left and moderate right foraminal stenosis.   L5-S1: Broad-based disc osteophyte complex. Moderate right and mild left facet arthropathy. Prominence of the epidural fat deforming the thecal sac as can be seen with epidural lipomatosis. No spinal stenosis. Moderate bilateral foraminal stenosis.   IMPRESSION: 1. At L4-5 there is a broad-based disc osteophyte complex with a broad small central disc protrusion. Moderate bilateral  facet arthropathy. Severe spinal stenosis. Severe left and moderate right foraminal stenosis. 2. At L5-S1 there is a broad-based disc osteophyte complex. Moderate right and mild left facet arthropathy. Prominence of the epidural fat deforming the thecal sac as can be seen with epidural lipomatosis. No spinal stenosis. Moderate bilateral foraminal stenosis. 3. Otherwise, lumbar spine spondylosis as described above. Overall, no significant interval change compared with the prior exam. 4.  No acute osseous injury of the lumbar spine.     Electronically Signed   By: Kathreen Devoid M.D.   On: 10/26/2021 14:42     PATIENT SURVEYS:  FOTO 54/100   SCREENING FOR RED FLAGS: Bowel or bladder incontinence: No Spinal tumors: No Cauda equina syndrome: No Compression fracture: No Abdominal aneurysm: No   COGNITION:           Overall cognitive status: Within functional limits for tasks assessed                          SENSATION: Neuropathy in feet    MUSCLE LENGTH: Hamstrings: Right 70 deg; Left 70 deg (muscular restriction in hamstrings) Thomas test: Negative bilaterally    POSTURE: No Significant postural limitations   PALPATION: L4-L5 central spinous process    LUMBAR ROM:    AROM eval  Flexion 75%*  Extension 50%*  Right lateral flexion 100%  Left lateral flexion 100%  Right rotation 100%  Left rotation 100%   (Blank rows = not tested)   LOWER EXTREMITY ROM:      Active  Right 11/18/2021 Left 11/18/2021  Hip flexion 120 120  Hip extension 30 30  Hip abduction 45 45  Hip adduction 30 30  Hip internal rotation 45 45  Hip external rotation 45 45  Knee flexion 135 135  Knee  extension 0 0  Ankle dorsiflexion 20 20  Ankle plantarflexion 50 50  Ankle inversion      Ankle eversion       (Blank rows = not tested)        LOWER EXTREMITY MMT:     MMT Right eval Left eval  Hip flexion 5 5  Hip extension 3+ 3+  Hip abduction 5 5  Hip adduction 3+ 3+  Hip  internal rotation      Hip external rotation      Knee flexion 5 5  Knee extension 5 5  Ankle dorsiflexion 5 5  Ankle plantarflexion      Ankle inversion      Ankle eversion       (Blank rows = not tested)   LUMBAR SPECIAL TESTS:  Straight leg raise test: Negative, FABER test: NT, Thomas test: Positive, and FADIR: NT    FUNCTIONAL TESTS:  5 times sit to stand: 22 sec  30 seconds chair stand test 6 minute walk test: 725 ft with quad cane  10 meter walk test: nt Dynamic Gait Index: 14/24   GAIT: Distance walked: 50 ft  Assistive device utilized: Quad cane large base Level of assistance: Modified independence Comments: Decreased step length and stance time       TODAY'S TREATMENT:  [facility] Adult PT Treatment:   DATE:  01/04/22:  There.ex:  Nu Step L4 for 5 minutes for UE/LE warm up and lumbar mobility   STS with airex pad under feet: 3x8, RUE support. CGA, min to mod TC's on pelvis and shoulders for upright posture.   Side stepping on airex beam: x10/direction. CGA. Moderate ankle and hip strategy to recover.    Neuro Re-Ed:  Outdoor gait for 5 min CGA. Asc/desc curbs, ambulating over mulched, hilled surfaces and grass surfaces. Notable decline in gait speed, step to pattern and kyphotic gait and dragging of LLE. Required seated rest on outside bench. Significant increase for falls. Encouraged pt's use of QC during outdoor ambulation.   Obstacle course x6 laps focusing on step overs: small 1/2 bolster --> shoe box --> large, blue bolster --> hurdle with items various distances apart to challenge proprioceptive understanding of obstacle navigation. X4 laps forwards, x2 laps side stepping. CGA.   Backwards gait step through pattern: 2 x 10 meters, CGA> Frequent VC's for increased LLE step lengths with good carryover.       PATIENT EDUCATION:  Education details: form and technique and explanation about deficits  Person educated: Patient Education method: Explanation,  Demonstration, Tactile cues, Verbal cues, and Handouts Education comprehension: verbalized understanding, returned demonstration, verbal cues required, and tactile cues required     HOME EXERCISE PROGRAM: Access Code: [email or web address] URL: [email or web address] Date: 12/09/2021 Prepared by: Bradly Chris  Exercises - Supine Lower Trunk Rotation  - 1 x daily - 3 sets - 10 reps - Supine Single Knee to Chest Stretch  - 1 x daily - 2 sets - 10 reps - 3 hold - Prone Quadriceps Stretch with Strap  - 1 x daily - 3 reps - 60 hold - Sit to Stand with Arms Crossed  - 3 x weekly - 3 sets - 15 reps - Figure-8 Walking Around [place]  - 1 x daily - 10 reps - Curl Up with Arms Crossed  - 3 x weekly - 3 sets - 10 reps - Standing Hip Abduction with Counter Support  - 3 x weekly - 3 sets - 10 reps -Standing Hip Adduction with  BUE support 3 x weekly- 3 sets- 10 reps   ASSESSMENT:   CLINICAL IMPRESSION: Assessment of outdoor gait and tolerance for standing and walking without AD. Pt presents with significant increased risk of falls with reduction in gait speed, kyphosis, step to pattern, and LLE pain and weakness leading to dragging of LLE within 5 minutes. Encouraged pt to utilize QC when outdoors due to falls concerns. Pt significantly improved with gait and balance on hard, level surfaces but maintains with difficulty with brief periods of SLS. Pt will continue to benefit from skilled PT services to progress strength, gait and balance and reduced pain to optimize function and reduced risk of falls.  OBJECTIVE IMPAIRMENTS: Abnormal gait, decreased balance, decreased endurance, decreased mobility, difficulty walking, decreased ROM, decreased strength, hypomobility, impaired flexibility, impaired sensation, and pain.    ACTIVITY LIMITATIONS: carrying, lifting, bending, standing, squatting, stairs, and locomotion level   PARTICIPATION LIMITATIONS: cleaning, shopping, community activity, and yard work    PERSONAL FACTORS: Age, Fitness, Time since onset of injury/illness/exacerbation, and 3+ comorbidities: h/o of prostate cancer, hypertension, T2DM  are also affecting patient's functional outcome.    REHAB POTENTIAL: Fair severity of symptoms and chronicity of condition    CLINICAL DECISION MAKING: Stable/uncomplicated   EVALUATION COMPLEXITY: Low     GOALS: Goals reviewed with patient? No   SHORT TERM GOALS: Target date: 12/02/2021   Pt will be independent with HEP in order to improve strength and balance in order to decrease fall risk and improve function at home and work.   Baseline: Completing independently  Goal status: Ongoing    2.  Pt will decrease 5TSTS by at least 3 seconds in order to demonstrate clinically significant improvement in LE strengt Baseline: 22 sec   12/09/21: 12 sec  Goal status: Achieved    3.  Pt will increase 6MWT by at least 10m(1618f in order to demonstrate clinically significant improvement in cardiopulmonary endurance and community ambulation Baseline: 725 ft with use of quad cane   12/21/21: 750 ft without quad cane  Goal status: Ongoing      LONG TERM GOALS: Target date: 01/27/2022   Patient will have improved function and activity level as evidenced by an increase in FOTO score by 10 points or more.  Baseline: 54/100 with target of 100  12/14/21: 57/100  12/21/21: 62/60 Goal status: Achieved    2.  Patient will perform 5 x STS in  <12 sec to demonstrate improved LE strength and to decrease risk of falling.   Baseline: 22 sec   12/09/21: 12 sec 12/21/21: 12 sec Goal status: Partially Met    3.  Patient will perform >=11 reps on 30 sec chair stands to demonstrate improve LE endurance and to decrease risk of falling  Baseline: 7 reps   12/09/21: 10 reps 12/21/21: 10 reps  Goal status: Partially Met    4.  Patient will demonstrate reduced falls risk as evidenced by Dynamic Gait Index (DGI) >19/24. Baseline: 14/24   12/14/21: 20/24 Goal status:  Achieved    5.  Patient will ambulate >=1,000 ft during 4m3mto show improved aerobic endurance and ability to walk distance that signifies he is a comHydrographic surveyoraseline: 725 ft with use of quad cane 12/21/21: 750 ft  Goal status: Partially Met      PLAN: PT FREQUENCY: 1-2x/week   PT DURATION: 10 weeks   PLANNED INTERVENTIONS: Therapeutic exercises, Neuromuscular re-education, Balance training, Gait training, Patient/Family education, Self Care, Joint mobilization, Joint manipulation,  Stair training, Vestibular training, DME instructions, Aquatic Therapy, Dry Needling, Electrical stimulation, Spinal manipulation, Spinal mobilization, Cryotherapy, Moist heat, Traction, Manual therapy, and Re-evaluation.   PLAN FOR NEXT SESSION: Dynamic balance, unstable surfaces, reduced seated breaks for endurance.    Salem Caster. Fairly IV, PT, DPT Physical Therapist- [facility]  01/04/2022, 2:24 PM

## 2022-01-06 ENCOUNTER — Ambulatory Visit: Payer: PPO

## 2022-01-06 ENCOUNTER — Encounter: Payer: Self-pay | Admitting: Physical Therapy

## 2022-01-06 DIAGNOSIS — R262 Difficulty in walking, not elsewhere classified: Secondary | ICD-10-CM

## 2022-01-06 DIAGNOSIS — M5459 Other low back pain: Secondary | ICD-10-CM

## 2022-01-06 NOTE — Therapy (Signed)
OUTPATIENT PHYSICAL THERAPY TREATMENT NOTE   Patient Name: Cristian Martin MRN: [id number] DOB:08-Aug-1945, 76 y.o., male Today's Date: 01/06/2022 [facility]CP:  [facility] not [facility]  REFERRING PROVIDER: Dr. Meade Maw  END OF SESSION:   PT End of Session - 01/06/22 0916     Visit Number 15    Number of Visits 21    Date for PT Re-Evaluation 01/27/22    Authorization Type [employer]    Authorization Time Period 11/18/21-01/27/22    Authorization - Visit Number 15    Authorization - Number of Visits 15    Progress Note Due on Visit 10    PT Start Time 0915    PT Stop Time 0956    PT Time Calculation (min) 41 min    Equipment Utilized During Treatment Gait belt;Back brace    Activity Tolerance Patient tolerated treatment well    Behavior During Therapy WFL for tasks assessed/performed              Past Medical History:  Diagnosis Date   Agent orange exposure    Coronary artery disease    a. 04/2019 Cath: LM 99, LAD 80p, D2 70, LCX 50ost/p, RCA 70p, EF 55-65%; b. 04/2019 CABG x 3 (LIMA to LAD, SVG to OM1, SVG to RCA).   Diabetic peripheral neuropathy (HCC)    Diastolic dysfunction    a 04/2019 Echo: EF 55-60%, mod conc LVH, Gr1 DD. Nl RV fxn. Mild Ao sclerosis w/o stenosis.   GERD (gastroesophageal reflux disease)    Hyperlipidemia    Hyperplasia of prostate with lower urinary tract symptoms (LUTS)    Hypertension    Prostate cancer [facility]) urologist-- dr ottelin/  oncologist-- dr Tammi Klippel   dx 02-21-2018  -- Stage T1c, Gleason 3+4   Type 2 diabetes mellitus treated with insulin ([place])    followed by [facility] in [place]   Wears dentures    upper    Wears glasses    "wears to help double vision"   Past Surgical History:  Procedure Laterality Date   COLONOSCOPY WITH PROPOFOL N/A 08/16/2019   Procedure: COLONOSCOPY WITH PROPOFOL;  Surgeon: Lin Landsman, MD;  Location: [place];  Service: Gastroenterology;  Laterality: N/A;   CORONARY ARTERY BYPASS GRAFT N/A  04/30/2019   Procedure: CORONARY ARTERY BYPASS GRAFTING (CABG) TIMES THREE USING LEFT INTERNAL MAMMARY AND RIGHT GREATER SAPHENOUS VEIN HARVESTED ENDOSCOPICALLY. MAMMARY ARTERY TO LAD, SAPHENOUS VEIN GRAFT TO  OM1, SAPHENOUS VEING GRAFT TO RIGHT. BIOPSY OF MEDIASTINAL TISSUE PLACEMENT OF RIGHT FEMORAL A-LINE;  Surgeon: Grace Isaac, MD;  Location: [place];  Service: Open Heart Surgery;  Laterality: N/A;   CYSTOSCOPY N/A 07/14/2018   Procedure: CYSTOSCOPY;  Surgeon: Kathie Rhodes, MD;  Location: [facility];  Service: Urology;  Laterality: N/A;  no seeds in bladder per Dr Karsten Ro   ESOPHAGOGASTRODUODENOSCOPY (EGD) WITH PROPOFOL N/A 08/16/2019   Procedure: ESOPHAGOGASTRODUODENOSCOPY (EGD) WITH PROPOFOL;  Surgeon: Lin Landsman, MD;  Location: [place];  Service: Gastroenterology;  Laterality: N/A;   LEFT HEART CATH AND CORONARY ANGIOGRAPHY N/A 04/27/2019   Procedure: LEFT HEART CATH AND CORONARY ANGIOGRAPHY;  Surgeon: Wellington Hampshire, MD;  Location: [place] [facility];  Service: Cardiovascular;  Laterality: N/A;   PROSTATE BIOPSY  02-21-2018  dr Karsten Ro in office   RADIOACTIVE SEED IMPLANT N/A 07/14/2018   Procedure: RADIOACTIVE SEED IMPLANT/BRACHYTHERAPY IMPLANT;  Surgeon: Kathie Rhodes, MD;  Location: [place];  Service: Urology;  Laterality: N/A;  76 seeds  SHOULDER ARTHROSCOPY Left 2018   in [place]   "remove spur"   SPACE OAR INSTILLATION N/A 07/14/2018   Procedure: SPACE OAR INSTILLATION;  Surgeon: Kathie Rhodes, MD;  Location: [facility];  Service: Urology;  Laterality: N/A;   TEE WITHOUT CARDIOVERSION N/A 04/30/2019   Procedure: TRANSESOPHAGEAL ECHOCARDIOGRAM (TEE);  Surgeon: Grace Isaac, MD;  Location: [place];  Service: Open Heart Surgery;  Laterality: N/A;   Patient Active Problem List   Diagnosis Date Noted   Paresthesia of both feet 04/09/2021   Spinal stenosis, lumbar region, with neurogenic claudication 03/03/2021    Chronic radicular lumbar pain 03/03/2021   Lumbar degenerative disc disease 03/03/2021   Lumbar facet arthropathy 03/03/2021   Chronic pain syndrome 03/03/2021   Iron deficiency anemia    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   Coronary artery disease 04/30/2019   Unstable angina ([place]) 04/27/2019   Malignant neoplasm of prostate ([place]) 04/09/2018   GERD (gastroesophageal reflux disease) 04/07/2018   Hx of colonic polyps 04/07/2018   Benign essential hypertension 12/02/2015   Hyperlipidemia, mixed 05/02/2015   Chronic painful diabetic neuropathy ([place]) 03/13/2015   DM (diabetes mellitus) type II controlled with renal manifestation ([place]) 08/08/2013    REFERRING DIAG:  \H85.277 (ICD-10-CM) - Neurogenic claudication due to lumbar spinal stenosis M51.16 (ICD-10-CM) - Lumbar disc herniation with radiculopathy   THERAPY DIAG:  Other low back pain  Difficulty in walking, not elsewhere classified  Rationale for Evaluation and Treatment Rehabilitation  PERTINENT HISTORY: Pt reports experiencing pain in his low back that spreads to his buttocks especially when standing up and walking. He is unable to walk for long periods of time because of back pain and he needs to take a break. He now uses a quad cane because of his balance. He describes a decline in his function after retiring at 76 years old. He to have a heart bypass.   PRECAUTIONS: Falls   SUBJECTIVE:                                                                                                                                                                                      SUBJECTIVE STATEMENT:  Pt reports BP meds reduced "in half" after MD visit yesterday. Reports his BP now running higher when monitoring it at home.   PAIN:  Are you having pain? Yes: NPRS scale: 4/10 Pain location: L4 CPA  Pain description: Achy  Aggravating factors: Waking up in morning  Relieving factors: Wearing back brace    OBJECTIVE: (objective measures  completed at initial evaluation unless otherwise dated)              VITALS:  BP 185/79 HR 79    DIAGNOSTIC FINDINGS:  CLINICAL DATA:  Low back pain, pain radiates down both legs. No known injury.   EXAM: MRI LUMBAR SPINE WITHOUT CONTRAST   TECHNIQUE: Multiplanar, multisequence MR imaging of the lumbar spine was performed. No intravenous contrast was administered.   COMPARISON:  12/16/2020   FINDINGS: Segmentation:  Standard.   Alignment:  Physiologic.   Vertebrae: No acute fracture, evidence of discitis, or aggressive bone lesion.   Conus medullaris and cauda equina: Conus extends to the L1 level. Conus and cauda equina appear normal.   Paraspinal and other soft tissues: No acute paraspinal abnormality.   Disc levels:   Disc spaces: Degenerative disease with disc height loss at L1-2, L2-3, L3-4, L4-5 and L5-S1.   T12-L1: No significant disc bulge. No neural foraminal stenosis. No central canal stenosis.   L1-L2: Mild broad-based disc bulge. Mild spinal stenosis. No foraminal stenosis.   L2-L3: Broad-based disc osteophyte complex flattening the ventral thecal sac. Mild spinal stenosis. No foraminal stenosis.   L3-L4: Broad-based disc osteophyte complex. Mild spinal stenosis. Mild bilateral facet arthropathy. No right foraminal stenosis. Mild left foraminal stenosis.   L4-L5: Broad-based disc osteophyte complex with a broad small central disc protrusion. Moderate bilateral facet arthropathy. Severe spinal stenosis. Severe left and moderate right foraminal stenosis.   L5-S1: Broad-based disc osteophyte complex. Moderate right and mild left facet arthropathy. Prominence of the epidural fat deforming the thecal sac as can be seen with epidural lipomatosis. No spinal stenosis. Moderate bilateral foraminal stenosis.   IMPRESSION: 1. At L4-5 there is a broad-based disc osteophyte complex with a broad small central disc protrusion. Moderate bilateral  facet arthropathy. Severe spinal stenosis. Severe left and moderate right foraminal stenosis. 2. At L5-S1 there is a broad-based disc osteophyte complex. Moderate right and mild left facet arthropathy. Prominence of the epidural fat deforming the thecal sac as can be seen with epidural lipomatosis. No spinal stenosis. Moderate bilateral foraminal stenosis. 3. Otherwise, lumbar spine spondylosis as described above. Overall, no significant interval change compared with the prior exam. 4.  No acute osseous injury of the lumbar spine.     Electronically Signed   By: Kathreen Devoid M.D.   On: 10/26/2021 14:42     PATIENT SURVEYS:  FOTO 54/100   SCREENING FOR RED FLAGS: Bowel or bladder incontinence: No Spinal tumors: No Cauda equina syndrome: No Compression fracture: No Abdominal aneurysm: No   COGNITION:           Overall cognitive status: Within functional limits for tasks assessed                          SENSATION: Neuropathy in feet    MUSCLE LENGTH: Hamstrings: Right 70 deg; Left 70 deg (muscular restriction in hamstrings) Thomas test: Negative bilaterally    POSTURE: No Significant postural limitations   PALPATION: L4-L5 central spinous process    LUMBAR ROM:    AROM eval  Flexion 75%*  Extension 50%*  Right lateral flexion 100%  Left lateral flexion 100%  Right rotation 100%  Left rotation 100%   (Blank rows = not tested)   LOWER EXTREMITY ROM:      Active  Right 11/18/2021 Left 11/18/2021  Hip flexion 120 120  Hip extension 30 30  Hip abduction 45 45  Hip adduction 30 30  Hip internal rotation 45 45  Hip external rotation 45 45  Knee flexion 135 135  Knee extension  0 0  Ankle dorsiflexion 20 20  Ankle plantarflexion 50 50  Ankle inversion      Ankle eversion       (Blank rows = not tested)        LOWER EXTREMITY MMT:     MMT Right eval Left eval  Hip flexion 5 5  Hip extension 3+ 3+  Hip abduction 5 5  Hip adduction 3+ 3+  Hip  internal rotation      Hip external rotation      Knee flexion 5 5  Knee extension 5 5  Ankle dorsiflexion 5 5  Ankle plantarflexion      Ankle inversion      Ankle eversion       (Blank rows = not tested)   LUMBAR SPECIAL TESTS:  Straight leg raise test: Negative, FABER test: NT, Thomas test: Positive, and FADIR: NT    FUNCTIONAL TESTS:  5 times sit to stand: 22 sec  30 seconds chair stand test 6 minute walk test: 725 ft with quad cane  10 meter walk test: nt Dynamic Gait Index: 14/24   GAIT: Distance walked: 50 ft  Assistive device utilized: Quad cane large base Level of assistance: Modified independence Comments: Decreased step length and stance time       TODAY'S TREATMENT:  [facility] Adult PT Treatment:   DATE:  01/06/22:  Vitals seated BP in LUE mechanical:  180/100 mm Hg   176/88 mm Hg    Neuro Re-Ed:  obstacle course forwards: amb over unstable surface (mat with porcupine ball underneath it) --> airex pad step up and overs x2 --> single LE on airex bema with other limb on flat surface: x4 CGA  obstacle course laterally R/L: amb over unstable surface (mat with porcupine ball underneath it) --> airex pad step up and overs x2 --> single LE on airex bema with other limb on flat surface: x4 CGA. Notable increase in hip and stepping strategy throughout.    Vitals seated LUE BP mechanical:  180/87 mm Hg 184/88 mm Hg  Standing on airex feet apart:  Eyes closed: 2 minutes total, CGA. Significant AP sway use of ankle strategy to correct. Pt able to self correct and improve static balance with time    Eyes open, horizontal and vertical head turns: 2 minutes/direction. Some AP sway noted but reduced compared to eyes closed. CGA.   Education provided on contacting MD that made adjustments BP meds to update on HTN readings at home and clinic. If unable to get in touch, encouraged pt to call PCP. Education provided on signs/symptoms of stroke (visual field changes, focal  weakness, facial droop, slurred speech, sudden memory changes), and BP readings that would indicate need to go to ED for medical treatment. Pt verbalized understanding.  PATIENT EDUCATION:  Education details: form and technique and explanation about deficits  Person educated: Patient Education method: Explanation, Demonstration, Tactile cues, Verbal cues, and Handouts Education comprehension: verbalized understanding, returned demonstration, verbal cues required, and tactile cues required     HOME EXERCISE PROGRAM: Access Code: [email or web address] URL: [email or web address] Date: 12/09/2021 Prepared by: Bradly Chris  Exercises - Supine Lower Trunk Rotation  - 1 x daily - 3 sets - 10 reps - Supine Single Knee to Chest Stretch  - 1 x daily - 2 sets - 10 reps - 3 hold - Prone Quadriceps Stretch with Strap  - 1 x daily - 3 reps - 60 hold - Sit to Stand with Arms Crossed  - 3  x weekly - 3 sets - 15 reps - Figure-8 Walking Around [employer]  - 1 x daily - 10 reps - Curl Up with Arms Crossed  - 3 x weekly - 3 sets - 10 reps - Standing Hip Abduction with Counter Support  - 3 x weekly - 3 sets - 10 reps -Standing Hip Adduction with BUE support 3 x weekly- 3 sets- 10 reps   ASSESSMENT:   CLINICAL IMPRESSION: Session limited due to elevated BP. Extensive time spent on monitoring BP at rest and with activity and signs/symptoms and BP readings that would warrant medical attention like signs/symptoms of stroke. Pt remains with difficulty with unstable surfaces, single leg tasks, and challenge to vestibular input with significant ankle and hip strategy to correct balance. Pt plans to contact MD office to discuss BP findings since BP med adjustment was made. Pt will continue to benefit from skilled PT services to progress strength, gait and balance and reduced pain to optimize function and reduced risk of falls.  OBJECTIVE IMPAIRMENTS: Abnormal gait, decreased balance, decreased endurance, decreased  mobility, difficulty walking, decreased ROM, decreased strength, hypomobility, impaired flexibility, impaired sensation, and pain.    ACTIVITY LIMITATIONS: carrying, lifting, bending, standing, squatting, stairs, and locomotion level   PARTICIPATION LIMITATIONS: cleaning, shopping, community activity, and yard work   PERSONAL FACTORS: Age, Fitness, Time since onset of injury/illness/exacerbation, and 3+ comorbidities: h/o of prostate cancer, hypertension, T2DM  are also affecting patient's functional outcome.    REHAB POTENTIAL: Fair severity of symptoms and chronicity of condition    CLINICAL DECISION MAKING: Stable/uncomplicated   EVALUATION COMPLEXITY: Low     GOALS: Goals reviewed with patient? No   SHORT TERM GOALS: Target date: 12/02/2021   Pt will be independent with HEP in order to improve strength and balance in order to decrease fall risk and improve function at home and work.   Baseline: Completing independently  Goal status: Ongoing    2.  Pt will decrease 5TSTS by at least 3 seconds in order to demonstrate clinically significant improvement in LE strengt Baseline: 22 sec   12/09/21: 12 sec  Goal status: Achieved    3.  Pt will increase 6MWT by at least 7m(1662f in order to demonstrate clinically significant improvement in cardiopulmonary endurance and community ambulation Baseline: 725 ft with use of quad cane   12/21/21: 750 ft without quad cane  Goal status: Ongoing      LONG TERM GOALS: Target date: 01/27/2022   Patient will have improved function and activity level as evidenced by an increase in FOTO score by 10 points or more.  Baseline: 54/100 with target of 100  12/14/21: 57/100  12/21/21: 62/60 Goal status: Achieved    2.  Patient will perform 5 x STS in  <12 sec to demonstrate improved LE strength and to decrease risk of falling.   Baseline: 22 sec   12/09/21: 12 sec 12/21/21: 12 sec Goal status: Partially Met    3.  Patient will perform >=11 reps on  30 sec chair stands to demonstrate improve LE endurance and to decrease risk of falling  Baseline: 7 reps   12/09/21: 10 reps 12/21/21: 10 reps  Goal status: Partially Met    4.  Patient will demonstrate reduced falls risk as evidenced by Dynamic Gait Index (DGI) >19/24. Baseline: 14/24   12/14/21: 20/24 Goal status: Achieved    5.  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 Baseline: 725 ft with use of quad cane 12/21/21: 750 ft  Goal status: Partially Met      PLAN: PT FREQUENCY: 1-2x/week   PT DURATION: 10 weeks   PLANNED INTERVENTIONS: Therapeutic exercises, Neuromuscular re-education, Balance training, Gait training, Patient/Family education, Self Care, Joint mobilization, Joint manipulation, Stair training, Vestibular training, DME instructions, Aquatic Therapy, Dry Needling, Electrical stimulation, Spinal manipulation, Spinal mobilization, Cryotherapy, Moist heat, Traction, Manual therapy, and Re-evaluation.   PLAN FOR NEXT SESSION: Check BP, Dynamic balance, unstable surfaces, reduced seated breaks for endurance.    Salem Caster. Fairly IV, PT, DPT Physical Therapist- [facility]  01/06/2022, 10:09 AM

## 2022-01-12 ENCOUNTER — Ambulatory Visit: Payer: PPO

## 2022-01-12 ENCOUNTER — Encounter: Payer: Self-pay | Admitting: Physical Therapy

## 2022-01-12 DIAGNOSIS — M5459 Other low back pain: Secondary | ICD-10-CM | POA: Diagnosis not present

## 2022-01-12 DIAGNOSIS — R262 Difficulty in walking, not elsewhere classified: Secondary | ICD-10-CM

## 2022-01-12 NOTE — Therapy (Signed)
OUTPATIENT PHYSICAL THERAPY TREATMENT NOTE   Patient Name: Cristian Martin MRN: [id number] DOB:1946/01/05, 76 y.o., male Today's Date: 01/12/2022 PCP:  [facility] not [facility]  REFERRING PROVIDER: Dr. Meade Maw  END OF SESSION:   PT End of Session - 01/12/22 1319     Visit Number 16    Number of Visits 21    Date for PT Re-Evaluation 01/27/22    Authorization Type [employer]    Authorization Time Period 11/18/21-01/27/22    Authorization - Visit Number 16    Authorization - Number of Visits 15    Progress Note Due on Visit 10    PT Start Time 1320    PT Stop Time 1405    PT Time Calculation (min) 45 min    Equipment Utilized During Treatment Gait belt;Back brace    Activity Tolerance Patient tolerated treatment well    Behavior During Therapy WFL for tasks assessed/performed              Past Medical History:  Diagnosis Date   Agent orange exposure    Coronary artery disease    a. 04/2019 Cath: LM 99, LAD 80p, D2 70, LCX 50ost/p, RCA 70p, EF 55-65%; b. 04/2019 CABG x 3 (LIMA to LAD, SVG to OM1, SVG to RCA).   Diabetic peripheral neuropathy (HCC)    Diastolic dysfunction    a 04/2019 Echo: EF 55-60%, mod conc LVH, Gr1 DD. Nl RV fxn. Mild Ao sclerosis w/o stenosis.   GERD (gastroesophageal reflux disease)    Hyperlipidemia    Hyperplasia of prostate with lower urinary tract symptoms (LUTS)    Hypertension    Prostate cancer [facility]) urologist-- dr ottelin/  oncologist-- dr Tammi Klippel   dx 02-21-2018  -- Stage T1c, Gleason 3+4   Type 2 diabetes mellitus treated with insulin ([place])    followed by [facility] in [place]   Wears dentures    upper    Wears glasses    "wears to help double vision"   Past Surgical History:  Procedure Laterality Date   COLONOSCOPY WITH PROPOFOL N/A 08/16/2019   Procedure: COLONOSCOPY WITH PROPOFOL;  Surgeon: Lin Landsman, MD;  Location: [place];  Service: Gastroenterology;  Laterality: N/A;   CORONARY ARTERY BYPASS GRAFT  N/A 04/30/2019   Procedure: CORONARY ARTERY BYPASS GRAFTING (CABG) TIMES THREE USING LEFT INTERNAL MAMMARY AND RIGHT GREATER SAPHENOUS VEIN HARVESTED ENDOSCOPICALLY. MAMMARY ARTERY TO LAD, SAPHENOUS VEIN GRAFT TO  OM1, SAPHENOUS VEING GRAFT TO RIGHT. BIOPSY OF MEDIASTINAL TISSUE PLACEMENT OF RIGHT FEMORAL A-LINE;  Surgeon: Grace Isaac, MD;  Location: [place];  Service: Open Heart Surgery;  Laterality: N/A;   CYSTOSCOPY N/A 07/14/2018   Procedure: CYSTOSCOPY;  Surgeon: Kathie Rhodes, MD;  Location: [facility];  Service: Urology;  Laterality: N/A;  no seeds in bladder per Dr Karsten Ro   ESOPHAGOGASTRODUODENOSCOPY (EGD) WITH PROPOFOL N/A 08/16/2019   Procedure: ESOPHAGOGASTRODUODENOSCOPY (EGD) WITH PROPOFOL;  Surgeon: Lin Landsman, MD;  Location: [place];  Service: Gastroenterology;  Laterality: N/A;   LEFT HEART CATH AND CORONARY ANGIOGRAPHY N/A 04/27/2019   Procedure: LEFT HEART CATH AND CORONARY ANGIOGRAPHY;  Surgeon: Wellington Hampshire, MD;  Location: [place] [facility];  Service: Cardiovascular;  Laterality: N/A;   PROSTATE BIOPSY  02-21-2018  dr Karsten Ro in office   RADIOACTIVE SEED IMPLANT N/A 07/14/2018   Procedure: RADIOACTIVE SEED IMPLANT/BRACHYTHERAPY IMPLANT;  Surgeon: Kathie Rhodes, MD;  Location: [place];  Service: Urology;  Laterality: N/A;  76 seeds  SHOULDER ARTHROSCOPY Left 2018   in [place]   "remove spur"   SPACE OAR INSTILLATION N/A 07/14/2018   Procedure: SPACE OAR INSTILLATION;  Surgeon: Kathie Rhodes, MD;  Location: [facility];  Service: Urology;  Laterality: N/A;   TEE WITHOUT CARDIOVERSION N/A 04/30/2019   Procedure: TRANSESOPHAGEAL ECHOCARDIOGRAM (TEE);  Surgeon: Grace Isaac, MD;  Location: [place];  Service: Open Heart Surgery;  Laterality: N/A;   Patient Active Problem List   Diagnosis Date Noted   Paresthesia of both feet 04/09/2021   Spinal stenosis, lumbar region, with neurogenic claudication  03/03/2021   Chronic radicular lumbar pain 03/03/2021   Lumbar degenerative disc disease 03/03/2021   Lumbar facet arthropathy 03/03/2021   Chronic pain syndrome 03/03/2021   Iron deficiency anemia    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   Coronary artery disease 04/30/2019   Unstable angina ([place]) 04/27/2019   Malignant neoplasm of prostate ([place]) 04/09/2018   GERD (gastroesophageal reflux disease) 04/07/2018   Hx of colonic polyps 04/07/2018   Benign essential hypertension 12/02/2015   Hyperlipidemia, mixed 05/02/2015   Chronic painful diabetic neuropathy ([place]) 03/13/2015   DM (diabetes mellitus) type II controlled with renal manifestation ([place]) 08/08/2013    REFERRING DIAG:  \B84.665 (ICD-10-CM) - Neurogenic claudication due to lumbar spinal stenosis M51.16 (ICD-10-CM) - Lumbar disc herniation with radiculopathy   THERAPY DIAG:  Other low back pain  Difficulty in walking, not elsewhere classified  Rationale for Evaluation and Treatment Rehabilitation  PERTINENT HISTORY: Pt reports experiencing pain in his low back that spreads to his buttocks especially when standing up and walking. He is unable to walk for long periods of time because of back pain and he needs to take a break. He now uses a quad cane because of his balance. He describes a decline in his function after retiring at 76 years old. He to have a heart bypass.   PRECAUTIONS: Falls   SUBJECTIVE:                                                                                                                                                                                      SUBJECTIVE STATEMENT:  Pt reports BP meds have been re-adjusted again. No stumbles or falls. BP has been monitored by his at home machine daily with pt having a written journal of BP readings. LBP at 3/10 NPS.  PAIN:  Are you having pain? Yes: NPRS scale: 3/10 Pain location: L4 CPA  Pain description: Achy  Aggravating factors: Waking up in morning   Relieving factors: Wearing back brace    OBJECTIVE: (objective measures completed at initial evaluation unless otherwise dated)  VITALS: BP 185/79 HR 79    DIAGNOSTIC FINDINGS:  CLINICAL DATA:  Low back pain, pain radiates down both legs. No known injury.   EXAM: MRI LUMBAR SPINE WITHOUT CONTRAST   TECHNIQUE: Multiplanar, multisequence MR imaging of the lumbar spine was performed. No intravenous contrast was administered.   COMPARISON:  12/16/2020   FINDINGS: Segmentation:  Standard.   Alignment:  Physiologic.   Vertebrae: No acute fracture, evidence of discitis, or aggressive bone lesion.   Conus medullaris and cauda equina: Conus extends to the L1 level. Conus and cauda equina appear normal.   Paraspinal and other soft tissues: No acute paraspinal abnormality.   Disc levels:   Disc spaces: Degenerative disease with disc height loss at L1-2, L2-3, L3-4, L4-5 and L5-S1.   T12-L1: No significant disc bulge. No neural foraminal stenosis. No central canal stenosis.   L1-L2: Mild broad-based disc bulge. Mild spinal stenosis. No foraminal stenosis.   L2-L3: Broad-based disc osteophyte complex flattening the ventral thecal sac. Mild spinal stenosis. No foraminal stenosis.   L3-L4: Broad-based disc osteophyte complex. Mild spinal stenosis. Mild bilateral facet arthropathy. No right foraminal stenosis. Mild left foraminal stenosis.   L4-L5: Broad-based disc osteophyte complex with a broad small central disc protrusion. Moderate bilateral facet arthropathy. Severe spinal stenosis. Severe left and moderate right foraminal stenosis.   L5-S1: Broad-based disc osteophyte complex. Moderate right and mild left facet arthropathy. Prominence of the epidural fat deforming the thecal sac as can be seen with epidural lipomatosis. No spinal stenosis. Moderate bilateral foraminal stenosis.   IMPRESSION: 1. At L4-5 there is a broad-based disc osteophyte complex  with a broad small central disc protrusion. Moderate bilateral facet arthropathy. Severe spinal stenosis. Severe left and moderate right foraminal stenosis. 2. At L5-S1 there is a broad-based disc osteophyte complex. Moderate right and mild left facet arthropathy. Prominence of the epidural fat deforming the thecal sac as can be seen with epidural lipomatosis. No spinal stenosis. Moderate bilateral foraminal stenosis. 3. Otherwise, lumbar spine spondylosis as described above. Overall, no significant interval change compared with the prior exam. 4.  No acute osseous injury of the lumbar spine.     Electronically Signed   By: Kathreen Devoid M.D.   On: 10/26/2021 14:42     PATIENT SURVEYS:  FOTO 54/100   SCREENING FOR RED FLAGS: Bowel or bladder incontinence: No Spinal tumors: No Cauda equina syndrome: No Compression fracture: No Abdominal aneurysm: No   COGNITION:           Overall cognitive status: Within functional limits for tasks assessed                          SENSATION: Neuropathy in feet    MUSCLE LENGTH: Hamstrings: Right 70 deg; Left 70 deg (muscular restriction in hamstrings) Thomas test: Negative bilaterally    POSTURE: No Significant postural limitations   PALPATION: L4-L5 central spinous process    LUMBAR ROM:    AROM eval  Flexion 75%*  Extension 50%*  Right lateral flexion 100%  Left lateral flexion 100%  Right rotation 100%  Left rotation 100%   (Blank rows = not tested)   LOWER EXTREMITY ROM:      Active  Right 11/18/2021 Left 11/18/2021  Hip flexion 120 120  Hip extension 30 30  Hip abduction 45 45  Hip adduction 30 30  Hip internal rotation 45 45  Hip external rotation 45 45  Knee flexion 135 135  Knee  extension 0 0  Ankle dorsiflexion 20 20  Ankle plantarflexion 50 50  Ankle inversion      Ankle eversion       (Blank rows = not tested)        LOWER EXTREMITY MMT:     MMT Right eval Left eval  Hip flexion 5 5  Hip  extension 3+ 3+  Hip abduction 5 5  Hip adduction 3+ 3+  Hip internal rotation      Hip external rotation      Knee flexion 5 5  Knee extension 5 5  Ankle dorsiflexion 5 5  Ankle plantarflexion      Ankle inversion      Ankle eversion       (Blank rows = not tested)   LUMBAR SPECIAL TESTS:  Straight leg raise test: Negative, FABER test: NT, Thomas test: Positive, and FADIR: NT    FUNCTIONAL TESTS:  5 times sit to stand: 22 sec  30 seconds chair stand test 6 minute walk test: 725 ft with quad cane  10 meter walk test: nt Dynamic Gait Index: 14/24   GAIT: Distance walked: 50 ft  Assistive device utilized: Quad cane large base Level of assistance: Modified independence Comments: Decreased step length and stance time       TODAY'S TREATMENT:  [facility] Adult PT Treatment:   DATE:  01/12/22:  Vitals seated BP in LUE mechanical:  153/72 mm Hg, HR: 88 BPM      There.ex:   STS with 2 KG med ball toss R/L outside BOS for ankle/hip strategy. 2x8. VC's for anterior weight shift to assist in standing.     Backwards gait for posterior hip strength and power production for gait speed: 4 x 10 meters, CGA. VC's for step through pattern. Then 2 x 10 meters, CGA after seated rest.     300' gait with 4# AW's for endurance training and LE strengthening. CGA. Min VC's for L foot clearance with fair to good carryover.     Neuro Re-Ed:  obstacle course forwards: amb over unstable surface (mat with porcupine ball underneath it)  stepping over 2 hurdles -->  6" step up and over -->  airex pad step up and over: x4 CGA. 4# AW's on LE's during. Intermittent stepping strategy to correct LOB.   obstacle course forwards: amb over unstable surface (mat with porcupine ball underneath it)  stepping over 2 hurdles --> single foot on 1/2 bolster on LLE to single foot on airex beam RLE to mimic ambulation over graded surfaces parallel --> large dynadisc step up and over: x4, 4# AW's on ankles. Stepping  stratgey to correct LOB frequently primarily on mat and dynadisc.   PATIENT EDUCATION:  Education details: form and technique and explanation about deficits  Person educated: Patient Education method: Explanation, Demonstration, Tactile cues, Verbal cues, and Handouts Education comprehension: verbalized understanding, returned demonstration, verbal cues required, and tactile cues required     HOME EXERCISE PROGRAM: Access Code: [email or web address] URL: [email or web address] Date: 12/09/2021 Prepared by: Bradly Chris  Exercises - Supine Lower Trunk Rotation  - 1 x daily - 3 sets - 10 reps - Supine Single Knee to Chest Stretch  - 1 x daily - 2 sets - 10 reps - 3 hold - Prone Quadriceps Stretch with Strap  - 1 x daily - 3 reps - 60 hold - Sit to Stand with Arms Crossed  - 3 x weekly - 3 sets - 15 reps - Figure-8 Walking Around [employer]  -  1 x daily - 10 reps - Curl Up with Arms Crossed  - 3 x weekly - 3 sets - 10 reps - Standing Hip Abduction with Counter Support  - 3 x weekly - 3 sets - 10 reps -Standing Hip Adduction with BUE support 3 x weekly- 3 sets- 10 reps   ASSESSMENT:   CLINICAL IMPRESSION: Continuing PT POC as BP in safer limits compared to prior session. Maintaining dynamic balance with single leg tasks and unstable surfaces and standing/gait tolerance using AW's and reduced sitting breaks. Progressed surface instability and height of step ups to challenge. Notable stepping strategy to correct LOB frequently with pt able to perform without external support from PT. Reports post session pain decreasing to 2/10 NPS. Pt will continue to benefit from skilled PT services to progress strength, gait and balance and reduced pain to optimize function and reduced risk of falls.  OBJECTIVE IMPAIRMENTS: Abnormal gait, decreased balance, decreased endurance, decreased mobility, difficulty walking, decreased ROM, decreased strength, hypomobility, impaired flexibility, impaired sensation,  and pain.    ACTIVITY LIMITATIONS: carrying, lifting, bending, standing, squatting, stairs, and locomotion level   PARTICIPATION LIMITATIONS: cleaning, shopping, community activity, and yard work   PERSONAL FACTORS: Age, Fitness, Time since onset of injury/illness/exacerbation, and 3+ comorbidities: h/o of prostate cancer, hypertension, T2DM  are also affecting patient's functional outcome.    REHAB POTENTIAL: Fair severity of symptoms and chronicity of condition    CLINICAL DECISION MAKING: Stable/uncomplicated   EVALUATION COMPLEXITY: Low     GOALS: Goals reviewed with patient? No   SHORT TERM GOALS: Target date: 12/02/2021   Pt will be independent with HEP in order to improve strength and balance in order to decrease fall risk and improve function at home and work.   Baseline: Completing independently  Goal status: Ongoing    2.  Pt will decrease 5TSTS by at least 3 seconds in order to demonstrate clinically significant improvement in LE strengt Baseline: 22 sec   12/09/21: 12 sec  Goal status: Achieved    3.  Pt will increase 6MWT by at least 59m(1669f in order to demonstrate clinically significant improvement in cardiopulmonary endurance and community ambulation Baseline: 725 ft with use of quad cane   12/21/21: 750 ft without quad cane  Goal status: Ongoing      LONG TERM GOALS: Target date: 01/27/2022   Patient will have improved function and activity level as evidenced by an increase in FOTO score by 10 points or more.  Baseline: 54/100 with target of 100  12/14/21: 57/100  12/21/21: 62/60 Goal status: Achieved    2.  Patient will perform 5 x STS in  <12 sec to demonstrate improved LE strength and to decrease risk of falling.   Baseline: 22 sec   12/09/21: 12 sec 12/21/21: 12 sec Goal status: Partially Met    3.  Patient will perform >=11 reps on 30 sec chair stands to demonstrate improve LE endurance and to decrease risk of falling  Baseline: 7 reps   12/09/21: 10  reps 12/21/21: 10 reps  Goal status: Partially Met    4.  Patient will demonstrate reduced falls risk as evidenced by Dynamic Gait Index (DGI) >19/24. Baseline: 14/24   12/14/21: 20/24 Goal status: Achieved    5.  Patient will ambulate >=1,000 ft during [id number] show improved aerobic endurance and ability to walk distance that signifies he is a comHydrographic surveyoraseline: 725 ft with use of quad cane 12/21/21: 750 ft  Goal  status: Partially Met      PLAN: PT FREQUENCY: 1-2x/week   PT DURATION: 10 weeks   PLANNED INTERVENTIONS: Therapeutic exercises, Neuromuscular re-education, Balance training, Gait training, Patient/Family education, Self Care, Joint mobilization, Joint manipulation, Stair training, Vestibular training, DME instructions, Aquatic Therapy, Dry Needling, Electrical stimulation, Spinal manipulation, Spinal mobilization, Cryotherapy, Moist heat, Traction, Manual therapy, and Re-evaluation.   PLAN FOR NEXT SESSION: Check BP, Dynamic balance, unstable surfaces, reduced seated breaks for endurance.    Salem Caster. Fairly IV, PT, DPT Physical Therapist- [facility]  01/12/2022, 2:19 PM

## 2022-01-14 ENCOUNTER — Ambulatory Visit: Payer: PPO

## 2022-01-14 ENCOUNTER — Encounter: Payer: Self-pay | Admitting: Physical Therapy

## 2022-01-14 DIAGNOSIS — R262 Difficulty in walking, not elsewhere classified: Secondary | ICD-10-CM

## 2022-01-14 DIAGNOSIS — M5459 Other low back pain: Secondary | ICD-10-CM | POA: Diagnosis not present

## 2022-01-14 NOTE — Therapy (Signed)
OUTPATIENT PHYSICAL THERAPY TREATMENT NOTE   Patient Name: Cristian Martin MRN: [id number] DOB:02/13/1945, 76 y.o., male Today's Date: 01/14/2022 [facility]CP:  [facility] not [facility]  REFERRING PROVIDER: Dr. Meade Maw  END OF SESSION:   PT End of Session - 01/14/22 0818     Visit Number 17    Number of Visits 21    Date for PT Re-Evaluation 01/27/22    Authorization Type [employer]    Authorization Time Period 11/18/21-01/27/22    Authorization - Number of Visits 15    Progress Note Due on Visit 10    PT Start Time 0759    PT Stop Time 0844    PT Time Calculation (min) 45 min    Equipment Utilized During Treatment Gait belt;Back brace    Activity Tolerance Patient tolerated treatment well    Behavior During Therapy WFL for tasks assessed/performed              Past Medical History:  Diagnosis Date   Agent orange exposure    Coronary artery disease    a. 04/2019 Cath: LM 99, LAD 80p, D2 70, LCX 50ost/p, RCA 70p, EF 55-65%; b. 04/2019 CABG x 3 (LIMA to LAD, SVG to OM1, SVG to RCA).   Diabetic peripheral neuropathy (HCC)    Diastolic dysfunction    a 04/2019 Echo: EF 55-60%, mod conc LVH, Gr1 DD. Nl RV fxn. Mild Ao sclerosis w/o stenosis.   GERD (gastroesophageal reflux disease)    Hyperlipidemia    Hyperplasia of prostate with lower urinary tract symptoms (LUTS)    Hypertension    Prostate cancer [facility]) urologist-- dr ottelin/  oncologist-- dr Tammi Klippel   dx 02-21-2018  -- Stage T1c, Gleason 3+4   Type 2 diabetes mellitus treated with insulin ([place])    followed by [facility] in [place]   Wears dentures    upper    Wears glasses    "wears to help double vision"   Past Surgical History:  Procedure Laterality Date   COLONOSCOPY WITH PROPOFOL N/A 08/16/2019   Procedure: COLONOSCOPY WITH PROPOFOL;  Surgeon: Lin Landsman, MD;  Location: [place];  Service: Gastroenterology;  Laterality: N/A;   CORONARY ARTERY BYPASS GRAFT N/A 04/30/2019   Procedure: CORONARY  ARTERY BYPASS GRAFTING (CABG) TIMES THREE USING LEFT INTERNAL MAMMARY AND RIGHT GREATER SAPHENOUS VEIN HARVESTED ENDOSCOPICALLY. MAMMARY ARTERY TO LAD, SAPHENOUS VEIN GRAFT TO  OM1, SAPHENOUS VEING GRAFT TO RIGHT. BIOPSY OF MEDIASTINAL TISSUE PLACEMENT OF RIGHT FEMORAL A-LINE;  Surgeon: Grace Isaac, MD;  Location: [place];  Service: Open Heart Surgery;  Laterality: N/A;   CYSTOSCOPY N/A 07/14/2018   Procedure: CYSTOSCOPY;  Surgeon: Kathie Rhodes, MD;  Location[facility];  Service: Urology;  Laterality: N/A;  no seeds in bladder per Dr Karsten Ro   ESOPHAGOGASTRODUODENOSCOPY (EGD) WITH PROPOFOL N/A 08/16/2019   Procedure: ESOPHAGOGASTRODUODENOSCOPY (EGD) WITH PROPOFOL;  Surgeon: Lin Landsman, MD;  Location: [place];  Service: Gastroenterology;  Laterality: N/A;   LEFT HEART CATH AND CORONARY ANGIOGRAPHY N/A 04/27/2019   Procedure: LEFT HEART CATH AND CORONARY ANGIOGRAPHY;  Surgeon: Wellington Hampshire, MD;  Location: [facility];  Service: Cardiovascular;  Laterality: N/A;   PROSTATE BIOPSY  02-21-2018  dr Karsten Ro in office   RADIOACTIVE SEED IMPLANT N/A 07/14/2018   Procedure: RADIOACTIVE SEED IMPLANT/BRACHYTHERAPY IMPLANT;  Surgeon: Kathie Rhodes, MD;  Location: [place];  Service: Urology;  Laterality: N/A;  76 seeds    SHOULDER ARTHROSCOPY Left 2018  in [place]   "remove spur"   SPACE OAR INSTILLATION N/A 07/14/2018   Procedure: SPACE OAR INSTILLATION;  Surgeon: Kathie Rhodes, MD;  Location: [facility];  Service: Urology;  Laterality: N/A;   TEE WITHOUT CARDIOVERSION N/A 04/30/2019   Procedure: TRANSESOPHAGEAL ECHOCARDIOGRAM (TEE);  Surgeon: Grace Isaac, MD;  Location: [place];  Service: Open Heart Surgery;  Laterality: N/A;   Patient Active Problem List   Diagnosis Date Noted   Paresthesia of both feet 04/09/2021   Spinal stenosis, lumbar region, with neurogenic claudication 03/03/2021   Chronic radicular lumbar pain  03/03/2021   Lumbar degenerative disc disease 03/03/2021   Lumbar facet arthropathy 03/03/2021   Chronic pain syndrome 03/03/2021   Iron deficiency anemia    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   Coronary artery disease 04/30/2019   Unstable angina ([place]) 04/27/2019   Malignant neoplasm of prostate ([place]) 04/09/2018   GERD (gastroesophageal reflux disease) 04/07/2018   Hx of colonic polyps 04/07/2018   Benign essential hypertension 12/02/2015   Hyperlipidemia, mixed 05/02/2015   Chronic painful diabetic neuropathy ([place]) 03/13/2015   DM (diabetes mellitus) type II controlled with renal manifestation ([place]) 08/08/2013    REFERRING DIAG:  \H85.277 (ICD-10-CM) - Neurogenic claudication due to lumbar spinal stenosis M51.16 (ICD-10-CM) - Lumbar disc herniation with radiculopathy   THERAPY DIAG:  Other low back pain  Difficulty in walking, not elsewhere classified  Rationale for Evaluation and Treatment Rehabilitation  PERTINENT HISTORY: Pt reports experiencing pain in his low back that spreads to his buttocks especially when standing up and walking. He is unable to walk for long periods of time because of back pain and he needs to take a break. He now uses a quad cane because of his balance. He describes a decline in his function after retiring at 76 years old. He to have a heart bypass.   PRECAUTIONS: Falls   SUBJECTIVE:                                                                                                                                                                                      SUBJECTIVE STATEMENT:  Pt reports BP has been fluctuating from low to high since BP meds adjusted. This a.m. pt reporting systolics in the 824'M with diastolics in mid 35'T. Pain a 2/10 NPS. Pain has consistently been lowered with less frequency of days with high pain levels.   PAIN:  Are you having pain? Yes: NPRS scale: 2/10 Pain location: L4 CPA  Pain description: Achy  Aggravating factors: Waking  up in morning  Relieving factors: Wearing back brace    OBJECTIVE: (objective measures completed at initial evaluation  unless otherwise dated)              VITALS: BP 185/79 HR 79    DIAGNOSTIC FINDINGS:  CLINICAL DATA:  Low back pain, pain radiates down both legs. No known injury.   EXAM: MRI LUMBAR SPINE WITHOUT CONTRAST   TECHNIQUE: Multiplanar, multisequence MR imaging of the lumbar spine was performed. No intravenous contrast was administered.   COMPARISON:  12/16/2020   FINDINGS: Segmentation:  Standard.   Alignment:  Physiologic.   Vertebrae: No acute fracture, evidence of discitis, or aggressive bone lesion.   Conus medullaris and cauda equina: Conus extends to the L1 level. Conus and cauda equina appear normal.   Paraspinal and other soft tissues: No acute paraspinal abnormality.   Disc levels:   Disc spaces: Degenerative disease with disc height loss at L1-2, L2-3, L3-4, L4-5 and L5-S1.   T12-L1: No significant disc bulge. No neural foraminal stenosis. No central canal stenosis.   L1-L2: Mild broad-based disc bulge. Mild spinal stenosis. No foraminal stenosis.   L2-L3: Broad-based disc osteophyte complex flattening the ventral thecal sac. Mild spinal stenosis. No foraminal stenosis.   L3-L4: Broad-based disc osteophyte complex. Mild spinal stenosis. Mild bilateral facet arthropathy. No right foraminal stenosis. Mild left foraminal stenosis.   L4-L5: Broad-based disc osteophyte complex with a broad small central disc protrusion. Moderate bilateral facet arthropathy. Severe spinal stenosis. Severe left and moderate right foraminal stenosis.   L5-S1: Broad-based disc osteophyte complex. Moderate right and mild left facet arthropathy. Prominence of the epidural fat deforming the thecal sac as can be seen with epidural lipomatosis. No spinal stenosis. Moderate bilateral foraminal stenosis.   IMPRESSION: 1. At L4-5 there is a broad-based disc  osteophyte complex with a broad small central disc protrusion. Moderate bilateral facet arthropathy. Severe spinal stenosis. Severe left and moderate right foraminal stenosis. 2. At L5-S1 there is a broad-based disc osteophyte complex. Moderate right and mild left facet arthropathy. Prominence of the epidural fat deforming the thecal sac as can be seen with epidural lipomatosis. No spinal stenosis. Moderate bilateral foraminal stenosis. 3. Otherwise, lumbar spine spondylosis as described above. Overall, no significant interval change compared with the prior exam. 4.  No acute osseous injury of the lumbar spine.     Electronically Signed   By: Kathreen Devoid M.D.   On: 10/26/2021 14:42     PATIENT SURVEYS:  FOTO 54/100   SCREENING FOR RED FLAGS: Bowel or bladder incontinence: No Spinal tumors: No Cauda equina syndrome: No Compression fracture: No Abdominal aneurysm: No   COGNITION:           Overall cognitive status: Within functional limits for tasks assessed                          SENSATION: Neuropathy in feet    MUSCLE LENGTH: Hamstrings: Right 70 deg; Left 70 deg (muscular restriction in hamstrings) Thomas test: Negative bilaterally    POSTURE: No Significant postural limitations   PALPATION: L4-L5 central spinous process    LUMBAR ROM:    AROM eval  Flexion 75%*  Extension 50%*  Right lateral flexion 100%  Left lateral flexion 100%  Right rotation 100%  Left rotation 100%   (Blank rows = not tested)   LOWER EXTREMITY ROM:      Active  Right 11/18/2021 Left 11/18/2021  Hip flexion 120 120  Hip extension 30 30  Hip abduction 45 45  Hip adduction 30 30  Hip internal  rotation 45 45  Hip external rotation 45 45  Knee flexion 135 135  Knee extension 0 0  Ankle dorsiflexion 20 20  Ankle plantarflexion 50 50  Ankle inversion      Ankle eversion       (Blank rows = not tested)        LOWER EXTREMITY MMT:     MMT Right eval Left eval  Hip  flexion 5 5  Hip extension 3+ 3+  Hip abduction 5 5  Hip adduction 3+ 3+  Hip internal rotation      Hip external rotation      Knee flexion 5 5  Knee extension 5 5  Ankle dorsiflexion 5 5  Ankle plantarflexion      Ankle inversion      Ankle eversion       (Blank rows = not tested)   LUMBAR SPECIAL TESTS:  Straight leg raise test: Negative, FABER test: NT, Thomas test: Positive, and FADIR: NT    FUNCTIONAL TESTS:  5 times sit to stand: 22 sec  30 seconds chair stand test 6 minute walk test: 725 ft with quad cane  10 meter walk test: nt Dynamic Gait Index: 14/24   GAIT: Distance walked: 50 ft  Assistive device utilized: Quad cane large base Level of assistance: Modified independence Comments: Decreased step length and stance time       TODAY'S TREATMENT:  [facility] Adult PT Treatment:   DATE: 01/14/22  Vitals seated BP in LUE mechanical prior to session:  187/85 mm Hg, HR: 82 BPM     Neuro Re-Ed:   Obstacle course: x2 airex pad step up and overs --> airex beam side steps --> x3 hurdle step through pattern step overs: x4 laps. CGA.    Vitals seated BP in LUE mechanical:   183/83 mm HG, HR: 85 BPM    Obstacle course forwards: amb over unstable surface (mat with porcupine balls underneath it)  stepping over 2 hurdles -->  6" step up and over -->  airex pad step up and over x2: x4 CGA. 4# AW's on LE's during. Intermittent stepping strategy to correct LOB.    300' gait with 4# AW's: performing horizontal and vertical head turns, gait speed changes, ambulating sideways and backwards upon PT request to work on reaction time and prevent pt from predictability of task.   Vitals seated BP in LUE mechanical:   180/83 mm Hg, HR; 86 BPM  100' gait with 4# AW's. Forwards --> R side steps --> backwards --> L side steps. CGA. Limited step through pattern with backwards gait despite VC's.     Educated pt on contacting PCP on elevated BP. Re-educated pt on signs/symptoms of  stroke and need for emergency services if symptoms occur. Pt verbalizing plan to call PCP.    PATIENT EDUCATION:  Education details: form and technique and explanation about deficits  Person educated: Patient Education method: Explanation, Demonstration, Tactile cues, Verbal cues, and Handouts Education comprehension: verbalized understanding, returned demonstration, verbal cues required, and tactile cues required     HOME EXERCISE PROGRAM: Access Code: [email or web address] URL: [email or web address] Date: 12/09/2021 Prepared by: Bradly Chris  Exercises - Supine Lower Trunk Rotation  - 1 x daily - 3 sets - 10 reps - Supine Single Knee to Chest Stretch  - 1 x daily - 2 sets - 10 reps - 3 hold - Prone Quadriceps Stretch with Strap  - 1 x daily - 3 reps - 60 hold - Sit to Stand  with Arms Crossed  - 3 x weekly - 3 sets - 15 reps - Figure-8 Walking Around [place]es  - 1 x daily - 10 reps - Curl Up with Arms Crossed  - 3 x weekly - 3 sets - 10 reps - Standing Hip Abduction with Counter Support  - 3 x weekly - 3 sets - 10 reps -Standing Hip Adduction with BUE support 3 x weekly- 3 sets- 10 reps   ASSESSMENT:   CLINICAL IMPRESSION: Session limited in neuro re-ed this day due to close monitoring of BP due to elevated BP readings. Pt improving in reaction time with ankle/hip strategies with frequent need for stepping strategy with unstable and firm surfaces involving side stepping. Encouraged pt on need to f/u with PCP on BP meds due to hypertensive readings. Pt understanding. Pt is subjectively reporting lowered pain levels consistently. Pt will continue to benefit from skilled PT services to progress strength, gait and balance and reduced pain to optimize function and reduced risk of falls.  OBJECTIVE IMPAIRMENTS: Abnormal gait, decreased balance, decreased endurance, decreased mobility, difficulty walking, decreased ROM, decreased strength, hypomobility, impaired flexibility, impaired  sensation, and pain.    ACTIVITY LIMITATIONS: carrying, lifting, bending, standing, squatting, stairs, and locomotion level   PARTICIPATION LIMITATIONS: cleaning, shopping, community activity, and yard work   PERSONAL FACTORS: Age, Fitness, Time since onset of injury/illness/exacerbation, and 3+ comorbidities: h/o of prostate cancer, hypertension, T2DM  are also affecting patient's functional outcome.    REHAB POTENTIAL: Fair severity of symptoms and chronicity of condition    CLINICAL DECISION MAKING: Stable/uncomplicated   EVALUATION COMPLEXITY: Low     GOALS: Goals reviewed with patient? No   SHORT TERM GOALS: Target date: 12/02/2021   Pt will be independent with HEP in order to improve strength and balance in order to decrease fall risk and improve function at home and work.   Baseline: Completing independently  Goal status: Ongoing    2.  Pt will decrease 5TSTS by at least 3 seconds in order to demonstrate clinically significant improvement in LE strengt Baseline: 22 sec   12/09/21: 12 sec  Goal status: Achieved    3.  Pt will increase 6MWT by at least 68m(1634f in order to demonstrate clinically significant improvement in cardiopulmonary endurance and community ambulation Baseline: 725 ft with use of quad cane   12/21/21: 750 ft without quad cane  Goal status: Ongoing      LONG TERM GOALS: Target date: 01/27/2022   Patient will have improved function and activity level as evidenced by an increase in FOTO score by 10 points or more.  Baseline: 54/100 with target of 100  12/14/21: 57/100  12/21/21: 62/60 Goal status: Achieved    2.  Patient will perform 5 x STS in  <12 sec to demonstrate improved LE strength and to decrease risk of falling.   Baseline: 22 sec   12/09/21: 12 sec 12/21/21: 12 sec Goal status: Partially Met    3.  Patient will perform >=11 reps on 30 sec chair stands to demonstrate improve LE endurance and to decrease risk of falling  Baseline: 7 reps    12/09/21: 10 reps 12/21/21: 10 reps  Goal status: Partially Met    4.  Patient will demonstrate reduced falls risk as evidenced by Dynamic Gait Index (DGI) >19/24. Baseline: 14/24   12/14/21: 20/24 Goal status: Achieved    5.  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 Baseline: 725 ft with use of quad cane 12/21/21: 750 ft  Goal status: Partially Met      PLAN: PT FREQUENCY: 1-2x/week   PT DURATION: 10 weeks   PLANNED INTERVENTIONS: Therapeutic exercises, Neuromuscular re-education, Balance training, Gait training, Patient/Family education, Self Care, Joint mobilization, Joint manipulation, Stair training, Vestibular training, DME instructions, Aquatic Therapy, Dry Needling, Electrical stimulation, Spinal manipulation, Spinal mobilization, Cryotherapy, Moist heat, Traction, Manual therapy, and Re-evaluation.   PLAN FOR NEXT SESSION: Check BP, Dynamic balance, unstable surfaces, reduced seated breaks for endurance.    Salem Caster. Fairly IV, PT, DPT Physical Therapist- [facility]  01/14/2022, 8:57 AM

## 2022-01-19 ENCOUNTER — Encounter: Payer: Self-pay | Admitting: Physical Therapy

## 2022-01-19 ENCOUNTER — Ambulatory Visit (INDEPENDENT_AMBULATORY_CARE_PROVIDER_SITE_OTHER): Payer: No Typology Code available for payment source | Admitting: Neurosurgery

## 2022-01-19 ENCOUNTER — Encounter: Payer: Self-pay | Admitting: Neurosurgery

## 2022-01-19 ENCOUNTER — Ambulatory Visit: Payer: PPO | Admitting: Physical Therapy

## 2022-01-19 VITALS — BP 136/80 | Ht 71.0 in | Wt 204.4 lb

## 2022-01-19 DIAGNOSIS — M5116 Intervertebral disc disorders with radiculopathy, lumbar region: Secondary | ICD-10-CM

## 2022-01-19 DIAGNOSIS — M5459 Other low back pain: Secondary | ICD-10-CM

## 2022-01-19 DIAGNOSIS — M48062 Spinal stenosis, lumbar region with neurogenic claudication: Secondary | ICD-10-CM

## 2022-01-19 DIAGNOSIS — R262 Difficulty in walking, not elsewhere classified: Secondary | ICD-10-CM

## 2022-01-19 NOTE — Progress Notes (Signed)
Referring Physician:  [facility] [address] [place],  [place] [address]  Primary Physician:  [place]  History of Present Illness: 01/19/22  [name] reports that he is continuing to make improvements in regards to his balance and bilateral leg pain however he continues to have significant back and buttock pain that are unrelieved by physical therapy.  He denies any new symptoms.  He states his balance has improved.  12/01/2021 [name] is doing somewhat better.  He is started physical therapy and feels that he is making progress.  10/06/2021 [name] is here today with a chief complaint of continued pain in his buttocks bilaterally.  He has trouble with walking.  He has been better about his sugars.  History of Present Illness: 04/30/2021 [name] is here today with a chief complaint of low back pain and bilateral leg pain along with weakness, numbness and tingling.   He has had progressive problems for the past 2 months with aching and nagging pain as bad as 10 out of 10 when he bends, climbs, walks, lifts, or kneels. He can only walk a short period at this point. Rest and laying down makes it better.  Bowel/Bladder Dysfunction: none  Conservative measures:  Physical therapy: has not participated in Multimodal medical therapy including regular antiinflammatories: tylenol, gabapentin, diclofenac gel Injections: has had epidural steroid injections at [facility] (didn't get any relief)  Past Surgery: denies  Cristian Martin has no symptoms of cervical myelopathy.  The symptoms are causing a significant impact on the patient's life.   Review of Systems:  A 10 point review of systems is negative, except for the pertinent positives and negatives detailed in the HPI.  Past Medical History: Past Medical History:  Diagnosis Date   Agent orange exposure    Coronary artery disease    a. 04/2019 Cath: LM 99, LAD 80p, D2 70,  LCX 50ost/p, RCA 70p, EF 55-65%; b. 04/2019 CABG x 3 (LIMA to LAD, SVG to OM1, SVG to RCA).   Diabetic peripheral neuropathy (HCC)    Diastolic dysfunction    a 04/2019 Echo: EF 55-60%, mod conc LVH, Gr1 DD. Nl RV fxn. Mild Ao sclerosis w/o stenosis.   GERD (gastroesophageal reflux disease)    Hyperlipidemia    Hyperplasia of prostate with lower urinary tract symptoms (LUTS)    Hypertension    Prostate cancer [facility]) urologist-- dr ottelin/  oncologist-- dr Tammi Klippel   dx 02-21-2018  -- Stage T1c, Gleason 3+4   Type 2 diabetes mellitus treated with insulin ([place])    followed by [facility] in [place]   Wears dentures    upper    Wears glasses    "wears to help double vision"    Past Surgical History: Past Surgical History:  Procedure Laterality Date   COLONOSCOPY WITH PROPOFOL N/A 08/16/2019   Procedure: COLONOSCOPY WITH PROPOFOL;  Surgeon: Lin Landsman, MD;  Location: [facility] ENDOSCOPY;  Service: Gastroenterology;  Laterality: N/A;   CORONARY ARTERY BYPASS GRAFT N/A 04/30/2019   Procedure: CORONARY ARTERY BYPASS GRAFTING (CABG) TIMES THREE USING LEFT INTERNAL MAMMARY AND RIGHT GREATER SAPHENOUS VEIN HARVESTED ENDOSCOPICALLY. MAMMARY ARTERY TO LAD, SAPHENOUS VEIN GRAFT TO  OM1, SAPHENOUS VEING GRAFT TO RIGHT. BIOPSY OF MEDIASTINAL TISSUE PLACEMENT OF RIGHT FEMORAL A-LINE;  Surgeon: Grace Isaac, MD;  Location: [facility];  Service: Open Heart Surgery;  Laterality: N/A;   CYSTOSCOPY N/A 07/14/2018   Procedure: CYSTOSCOPY;  Surgeon: Kathie Rhodes, MD;  Location: [place];  Service: Urology;  Laterality: N/A;  no seeds in bladder per Dr Karsten Ro   ESOPHAGOGASTRODUODENOSCOPY (EGD) WITH PROPOFOL N/A 08/16/2019   Procedure: ESOPHAGOGASTRODUODENOSCOPY (EGD) WITH PROPOFOL;  Surgeon: Lin Landsman, MD;  Location: [place];  Service: Gastroenterology;  Laterality: N/A;   LEFT HEART CATH AND CORONARY ANGIOGRAPHY N/A 04/27/2019   Procedure: LEFT HEART CATH AND CORONARY  ANGIOGRAPHY;  Surgeon: Wellington Hampshire, MD;  Location: [facility];  Service: Cardiovascular;  Laterality: N/A;   PROSTATE BIOPSY  02-21-2018  dr Karsten Ro in office   RADIOACTIVE SEED IMPLANT N/A 07/14/2018   Procedure: RADIOACTIVE SEED IMPLANT/BRACHYTHERAPY IMPLANT;  Surgeon: Kathie Rhodes, MD;  Location: [facility];  Service: Urology;  Laterality: N/A;  77 seeds    SHOULDER ARTHROSCOPY Left 2018   in [place]   "remove spur"   SPACE OAR INSTILLATION N/A 07/14/2018   Procedure: SPACE OAR INSTILLATION;  Surgeon: Kathie Rhodes, MD;  Location: [facility];  Service: Urology;  Laterality: N/A;   TEE WITHOUT CARDIOVERSION N/A 04/30/2019   Procedure: TRANSESOPHAGEAL ECHOCARDIOGRAM (TEE);  Surgeon: Grace Isaac, MD;  Location: [facility];  Service: Open Heart Surgery;  Laterality: N/A;    Allergies: Allergies as of 12/01/2021 - Review Complete 12/01/2021  Allergen Reaction Noted   Ace inhibitors Other (See Comments) 08/01/2013    Medications: Current Meds  Medication Sig   aspirin EC 81 MG tablet Take 1 tablet (81 mg total) by mouth daily. Swallow whole.   carvedilol (COREG) 25 MG tablet Take 25 mg by mouth 2 (two) times daily with a meal.   diclofenac Sodium (VOLTAREN) 1 % GEL Apply topically 4 (four) times daily.   DULoxetine (CYMBALTA) 60 MG capsule Take 60 mg by mouth daily.   famotidine (PEPCID) 20 MG tablet Take 20 mg by mouth 2 (two) times daily.   gabapentin (NEURONTIN) 600 MG tablet Take 1 tablet by mouth at bedtime.   insulin aspart protamine - aspart (NOVOLOG MIX 70/30 FLEXPEN) (70-30) 100 UNIT/ML FlexPen Inject 0.2 mLs (20 Units total) into the skin 2 (two) times daily before a meal. As the patient and I discussed, start with a lower dose of Insulin as he has not been eating well. Titrate Insulin upward to dose taken prior to surgery (60 units bid) as tolerates. (Patient taking differently: Inject 60 Units into the skin 2 (two) times daily before  a meal.)   isosorbide mononitrate (IMDUR) 30 MG 24 hr tablet Take 30 mg by mouth daily.   losartan (COZAAR) 100 MG tablet Take 100 mg by mouth daily.   melatonin 3 MG TABS tablet Take 3 mg by mouth at bedtime.   metFORMIN (GLUCOPHAGE-XR) 500 MG 24 hr tablet Take 1,000 mg by mouth in the morning and at bedtime.   Multiple Vitamins-Minerals (MULTIVITAMIN ADULT PO) Take 1 tablet by mouth daily.    nitroGLYCERIN (NITROSTAT) 0.4 MG SL tablet    omeprazole (PRILOSEC) 20 MG capsule Take 20 mg by mouth 2 (two) times daily.    rosuvastatin (CRESTOR) 40 MG tablet Take 1 tablet (40 mg total) by mouth daily. (Patient taking differently: Take 20 mg by mouth daily.)   Semaglutide (OZEMPIC, 1 MG/DOSE, [place]) Inject 1 mg into the skin every Sunday.     Social History: Social History   Tobacco Use   Smoking status: Never   Smokeless tobacco: Never  Vaping Use   Vaping Use: Never used  Substance Use Topics   Alcohol use:  Not Currently   Drug use: Never    Family Medical History: Family History  Problem Relation Age of Onset   Stroke Mother    Diabetes Father    Hypertension Father    Diabetes Sister    Cancer Neg Hx     Physical Examination: Vitals:   12/01/21 0944  BP: 138/82    General: Patient is well developed, well nourished, calm, collected, and in no apparent distress. Attention to examination is appropriate.  Respiratory: Patient is breathing without any difficulty.   NEUROLOGICAL:     Awake, alert, oriented to person, place, and time.  Speech is clear and fluent. Fund of knowledge is appropriate.   Cranial Nerves: Grossly intact ROM of spine: full.    Strength: Side Biceps Triceps Deltoid Interossei Grip Wrist Ext. Wrist Flex.  R '5 5 5 5 5 5 5  '$ L '5 5 5 5 5 5 5   '$ Side Iliopsoas Quads Hamstring PF DF EHL  R '5 5 5 5 5 5  '$ L '5 5 5 5 5 5   '$ Reflexes are 1+ and symmetric at the biceps, triceps, brachioradialis, patella and achilles.    Clonus is not present.  Toes are  down-going.  Bilateral upper and lower extremity sensation is intact to light touch.    No evidence of dysmetria noted.  Gait is abnormal and requires a 4-prong cane.     Medical Decision Making  Imaging: MRI L spine 12/16/2020 IMPRESSION:  1. Lumbar spine spondylosis as described above most severe at L4-5.  2. At L4-5 there is a broad-based disc bulge with a broad central  disc protrusion. Mild bilateral facet arthropathy with ligamentum  flavum infolding. Severe spinal stenosis. Severe bilateral foraminal  stenosis.  3. No acute osseous injury of the lumbar spine.   Electronically Signed    By: Kathreen Devoid M.D.    On: 12/17/2020 10:08  MRI L spine 10/26/2021 IMPRESSION: 1. At L4-5 there is a broad-based disc osteophyte complex with a broad small central disc protrusion. Moderate bilateral facet arthropathy. Severe spinal stenosis. Severe left and moderate right foraminal stenosis. 2. At L5-S1 there is a broad-based disc osteophyte complex. Moderate right and mild left facet arthropathy. Prominence of the epidural fat deforming the thecal sac as can be seen with epidural lipomatosis. No spinal stenosis. Moderate bilateral foraminal stenosis. 3. Otherwise, lumbar spine spondylosis as described above. Overall, no significant interval change compared with the prior exam. 4.  No acute osseous injury of the lumbar spine.     Electronically Signed   By: Kathreen Devoid M.D.   On: 10/26/2021 14:42  I have personally reviewed the images and agree with the above interpretation.  Assessment and Plan: [name] is a pleasant 76 y.o. 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  While he does have improvement of his radicular symptoms, he continues to have significant low back pain that is affecting his ability to do daily activities such as bring out his trash cans.  He would like to explore additional options.  We discussed potentially discussing surgery versus  injections.  He has had several neighbors undergo injections and would like to try this option.  I have placed a referral to Dr. Holley Raring for consideration of epidural versus facet injections.  I will see him back via telephone visit in 4 to 6 weeks to evaluate his progress with completion of physical therapy and injections.  He was encouraged to call the office in  the interim should he have any questions or concerns.  He expressed understanding was in agreement with this plan.  I spent a total of 22 minutes in both face-to-face and non-face-to-face activities for this visit on the date of this encounter including review of documentation, discussion of symptoms, discussion of plan of care options, documentation, and order placement.  Cooper Render PA-C Neurosurgery

## 2022-01-19 NOTE — Therapy (Addendum)
OUTPATIENT PHYSICAL THERAPY TREATMENT NOTE   Patient Name: Cristian Martin MRN: [id number] DOB:[birth year], 76 y.o., male Today's Date: 01/19/2022 [facility]CP:  [facility] not [facility]  REFERRING PROVIDER: Dr. Meade Maw  END OF SESSION:   PT End of Session - 01/19/22 1333     Visit Number 18    Number of Visits 21    Date for PT Re-Evaluation 01/27/22    Authorization Type [employer]    Authorization Time Period 11/18/21-01/27/22    Authorization - Visit Number 18    Authorization - Number of Visits 21    Progress Note Due on Visit 20    PT Start Time 1330    PT Stop Time 1415    PT Time Calculation (min) 45 min    Equipment Utilized During Treatment Gait belt;Back brace    Activity Tolerance Patient tolerated treatment well    Behavior During Therapy WFL for tasks assessed/performed              Past Medical History:  Diagnosis Date   Agent orange exposure    Coronary artery disease    a. 04/2019 Cath: LM 99, LAD 80p, D2 70, LCX 50ost/p, RCA 70p, EF 55-65%; b. 04/2019 CABG x 3 (LIMA to LAD, SVG to OM1, SVG to RCA).   Diabetic peripheral neuropathy (HCC)    Diastolic dysfunction    a 04/2019 Echo: EF 55-60%, mod conc LVH, Gr1 DD. Nl RV fxn. Mild Ao sclerosis w/o stenosis.   GERD (gastroesophageal reflux disease)    Hyperlipidemia    Hyperplasia of prostate with lower urinary tract symptoms (LUTS)    Hypertension    Prostate cancer [facility]) urologist-- dr ottelin/  oncologist-- dr Tammi Klippel   dx 02-21-2018  -- Stage T1c, Gleason 3+4   Type 2 diabetes mellitus treated with insulin ([place])    followed by [facility] in [place]   Wears dentures    upper    Wears glasses    "wears to help double vision"   Past Surgical History:  Procedure Laterality Date   COLONOSCOPY WITH PROPOFOL N/A 08/16/2019   Procedure: COLONOSCOPY WITH PROPOFOL;  Surgeon: Lin Landsman, MD;  Location: [place];  Service: Gastroenterology;  Laterality: N/A;   CORONARY ARTERY BYPASS GRAFT  N/A 04/30/2019   Procedure: CORONARY ARTERY BYPASS GRAFTING (CABG) TIMES THREE USING LEFT INTERNAL MAMMARY AND RIGHT GREATER SAPHENOUS VEIN HARVESTED ENDOSCOPICALLY. MAMMARY ARTERY TO LAD, SAPHENOUS VEIN GRAFT TO  OM1, SAPHENOUS VEING GRAFT TO RIGHT. BIOPSY OF MEDIASTINAL TISSUE PLACEMENT OF RIGHT FEMORAL A-LINE;  Surgeon: Grace Isaac, MD;  Location: [place];  Service: Open Heart Surgery;  Laterality: N/A;   CYSTOSCOPY N/A 07/14/2018   Procedure: CYSTOSCOPY;  Surgeon: Kathie Rhodes, MD;  Location: [facility];  Service: Urology;  Laterality: N/A;  no seeds in bladder per Dr Karsten Ro   ESOPHAGOGASTRODUODENOSCOPY (EGD) WITH PROPOFOL N/A 08/16/2019   Procedure: ESOPHAGOGASTRODUODENOSCOPY (EGD) WITH PROPOFOL;  Surgeon: Lin Landsman, MD;  Location: [place];  Service: Gastroenterology;  Laterality: N/A;   LEFT HEART CATH AND CORONARY ANGIOGRAPHY N/A 04/27/2019   Procedure: LEFT HEART CATH AND CORONARY ANGIOGRAPHY;  Surgeon: Wellington Hampshire, MD;  Location: [place] [facility];  Service: Cardiovascular;  Laterality: N/A;   PROSTATE BIOPSY  02-21-2018  dr Karsten Ro in office   RADIOACTIVE SEED IMPLANT N/A 07/14/2018   Procedure: RADIOACTIVE SEED IMPLANT/BRACHYTHERAPY IMPLANT;  Surgeon: Kathie Rhodes, MD;  Location: [place];  Service: Urology;  Laterality: N/A;  76 seeds  SHOULDER ARTHROSCOPY Left 2018   in [place]   "remove spur"   SPACE OAR INSTILLATION N/A 07/14/2018   Procedure: SPACE OAR INSTILLATION;  Surgeon: Kathie Rhodes, MD;  Location[facility];  Service: Urology;  Laterality: N/A;   TEE WITHOUT CARDIOVERSION N/A 04/30/2019   Procedure: TRANSESOPHAGEAL ECHOCARDIOGRAM (TEE);  Surgeon: Grace Isaac, MD;  Location: [place];  Service: Open Heart Surgery;  Laterality: N/A;   Patient Active Problem List   Diagnosis Date Noted   Paresthesia of both feet 04/09/2021   Spinal stenosis, lumbar region, with neurogenic claudication  03/03/2021   Chronic radicular lumbar pain 03/03/2021   Lumbar degenerative disc disease 03/03/2021   Lumbar facet arthropathy 03/03/2021   Chronic pain syndrome 03/03/2021   Iron deficiency anemia    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   Coronary artery disease 04/30/2019   Unstable angina ([place]) 04/27/2019   Malignant neoplasm of prostate ([place]) 04/09/2018   GERD (gastroesophageal reflux disease) 04/07/2018   Hx of colonic polyps 04/07/2018   Benign essential hypertension 12/02/2015   Hyperlipidemia, mixed 05/02/2015   Chronic painful diabetic neuropathy ([place]) 03/13/2015   DM (diabetes mellitus) type II controlled with renal manifestation ([place]) 08/08/2013    REFERRING DIAG:  \G38.756 (ICD-10-CM) - Neurogenic claudication due to lumbar spinal stenosis M51.16 (ICD-10-CM) - Lumbar disc herniation with radiculopathy   THERAPY DIAG:  Other low back pain  Difficulty in walking, not elsewhere classified  Rationale for Evaluation and Treatment Rehabilitation  PERTINENT HISTORY: Pt reports experiencing pain in his low back that spreads to his buttocks especially when standing up and walking. He is unable to walk for long periods of time because of back pain and he needs to take a break. He now uses a quad cane because of his balance. He describes a decline in his function after retiring at 76 years old. He to have a heart bypass.   PRECAUTIONS: Falls   SUBJECTIVE:                                                                                                                                                                                      SUBJECTIVE STATEMENT:  Pt reports seeing PA today for LBP and that he will likely be getting LBP injections.   PAIN:  Are you having pain? Yes: NPRS scale: 4/10 Pain location: L4 CPA  Pain description: Achy  Aggravating factors: Waking up in morning  Relieving factors: Wearing back brace    OBJECTIVE: (objective measures completed at initial evaluation  unless otherwise dated)              VITALS: BP 185/79 HR 79  DIAGNOSTIC FINDINGS:  CLINICAL DATA:  Low back pain, pain radiates down both legs. No known injury.   EXAM: MRI LUMBAR SPINE WITHOUT CONTRAST   TECHNIQUE: Multiplanar, multisequence MR imaging of the lumbar spine was performed. No intravenous contrast was administered.   COMPARISON:  12/16/2020   FINDINGS: Segmentation:  Standard.   Alignment:  Physiologic.   Vertebrae: No acute fracture, evidence of discitis, or aggressive bone lesion.   Conus medullaris and cauda equina: Conus extends to the L1 level. Conus and cauda equina appear normal.   Paraspinal and other soft tissues: No acute paraspinal abnormality.   Disc levels:   Disc spaces: Degenerative disease with disc height loss at L1-2, L2-3, L3-4, L4-5 and L5-S1.   T12-L1: No significant disc bulge. No neural foraminal stenosis. No central canal stenosis.   L1-L2: Mild broad-based disc bulge. Mild spinal stenosis. No foraminal stenosis.   L2-L3: Broad-based disc osteophyte complex flattening the ventral thecal sac. Mild spinal stenosis. No foraminal stenosis.   L3-L4: Broad-based disc osteophyte complex. Mild spinal stenosis. Mild bilateral facet arthropathy. No right foraminal stenosis. Mild left foraminal stenosis.   L4-L5: Broad-based disc osteophyte complex with a broad small central disc protrusion. Moderate bilateral facet arthropathy. Severe spinal stenosis. Severe left and moderate right foraminal stenosis.   L5-S1: Broad-based disc osteophyte complex. Moderate right and mild left facet arthropathy. Prominence of the epidural fat deforming the thecal sac as can be seen with epidural lipomatosis. No spinal stenosis. Moderate bilateral foraminal stenosis.   IMPRESSION: 1. At L4-5 there is a broad-based disc osteophyte complex with a broad small central disc protrusion. Moderate bilateral facet arthropathy. Severe spinal stenosis.  Severe left and moderate right foraminal stenosis. 2. At L5-S1 there is a broad-based disc osteophyte complex. Moderate right and mild left facet arthropathy. Prominence of the epidural fat deforming the thecal sac as can be seen with epidural lipomatosis. No spinal stenosis. Moderate bilateral foraminal stenosis. 3. Otherwise, lumbar spine spondylosis as described above. Overall, no significant interval change compared with the prior exam. 4.  No acute osseous injury of the lumbar spine.     Electronically Signed   By: Kathreen Devoid M.D.   On: 10/26/2021 14:42     PATIENT SURVEYS:  FOTO 54/100   SCREENING FOR RED FLAGS: Bowel or bladder incontinence: No Spinal tumors: No Cauda equina syndrome: No Compression fracture: No Abdominal aneurysm: No   COGNITION:           Overall cognitive status: Within functional limits for tasks assessed                          SENSATION: Neuropathy in feet    MUSCLE LENGTH: Hamstrings: Right 70 deg; Left 70 deg (muscular restriction in hamstrings) Thomas test: Negative bilaterally    POSTURE: No Significant postural limitations   PALPATION: L4-L5 central spinous process    LUMBAR ROM:    AROM eval  Flexion 75%*  Extension 50%*  Right lateral flexion 100%  Left lateral flexion 100%  Right rotation 100%  Left rotation 100%   (Blank rows = not tested)   LOWER EXTREMITY ROM:      Active  Right 11/18/2021 Left 11/18/2021  Hip flexion 120 120  Hip extension 30 30  Hip abduction 45 45  Hip adduction 30 30  Hip internal rotation 45 45  Hip external rotation 45 45  Knee flexion 135 135  Knee extension 0 0  Ankle dorsiflexion 20 20  Ankle plantarflexion 50 50  Ankle inversion      Ankle eversion       (Blank rows = not tested)        LOWER EXTREMITY MMT:     MMT Right eval Left eval  Hip flexion 5 5  Hip extension 3+ 3+  Hip abduction 5 5  Hip adduction 3+ 3+  Hip internal rotation      Hip external rotation       Knee flexion 5 5  Knee extension 5 5  Ankle dorsiflexion 5 5  Ankle plantarflexion      Ankle inversion      Ankle eversion       (Blank rows = not tested)   LUMBAR SPECIAL TESTS:  Straight leg raise test: Negative, FABER test: NT, Thomas test: Positive, and FADIR: NT    FUNCTIONAL TESTS:  5 times sit to stand: 22 sec  30 seconds chair stand test 6 minute walk test: 725 ft with quad cane  10 meter walk test: nt Dynamic Gait Index: 14/24   GAIT: Distance walked: 50 ft  Assistive device utilized: Quad cane large base Level of assistance: Modified independence Comments: Decreased step length and stance time       TODAY'S TREATMENT:  [facility] Adult PT Treatment:   DATE:   01/19/22:  BP 143/73 Nu-Step Level 3 with seat and arms at 9 for 5 min  Overground walking 500 ft  Sit to Stand without UE support 1 x 10  Overground walking 500 ft with three 1 ft obstacles  Sit to Stand without UE support 1 x 10  Lateral steps 50 ft x 2, backwards x 100 ft, forward x 100 ft for total 5x  Heel Raises x 15  Standing Marches 2 x 10  Fast walk on straight aways 150 ft x 2, walk slow on ends x 50 ft x 2 for total of 500 ft   01/14/22  Vitals seated BP in LUE mechanical prior to session:  187/85 mm Hg, HR: 82 BPM     Neuro Re-Ed:   Obstacle course: x2 airex pad step up and overs --> airex beam side steps --> x3 hurdle step through pattern step overs: x4 laps. CGA.    Vitals seated BP in LUE mechanical:   183/83 mm HG, HR: 85 BPM    Obstacle course forwards: amb over unstable surface (mat with porcupine balls underneath it)  stepping over 2 hurdles -->  6" step up and over -->  airex pad step up and over x2: x4 CGA. 4# AW's on LE's during. Intermittent stepping strategy to correct LOB.    300' gait with 4# AW's: performing horizontal and vertical head turns, gait speed changes, ambulating sideways and backwards upon PT request to work on reaction time and prevent pt from  predictability of task.   Vitals seated BP in LUE mechanical:   180/83 mm Hg, HR; 86 BPM  100' gait with 4# AW's. Forwards --> R side steps --> backwards --> L side steps. CGA. Limited step through pattern with backwards gait despite VC's.     Educated pt on contacting PCP on elevated BP. Re-educated pt on signs/symptoms of stroke and need for emergency services if symptoms occur. Pt verbalizing plan to call PCP.    PATIENT EDUCATION:  Education details: form and technique and explanation about deficits  Person educated: Patient Education method: Explanation, Demonstration, Tactile cues, Verbal cues, and Handouts Education comprehension: verbalized understanding, returned demonstration, verbal cues  required, and tactile cues required     HOME EXERCISE PROGRAM: Access Code: [email or web address] URL: [email or web address] Date: 12/09/2021 Prepared by: Bradly Chris  Exercises - Supine Lower Trunk Rotation  - 1 x daily - 3 sets - 10 reps - Supine Single Knee to Chest Stretch  - 1 x daily - 2 sets - 10 reps - 3 hold - Prone Quadriceps Stretch with Strap  - 1 x daily - 3 reps - 60 hold - Sit to Stand with Arms Crossed  - 3 x weekly - 3 sets - 15 reps - Figure-8 Walking Around [place]es  - 1 x daily - 10 reps - Curl Up with Arms Crossed  - 3 x weekly - 3 sets - 10 reps - Standing Hip Abduction with Counter Support  - 3 x weekly - 3 sets - 10 reps -Standing Hip Adduction with BUE support 3 x weekly- 3 sets- 10 reps   ASSESSMENT:   CLINICAL IMPRESSION: Pt shows improvement in vitals and  LE endurance with an ability to complete an increased volume of exercises with circuit training. He is nearing progress note period and he will be back tomorrow for another session in which his goals will be reassessed. Pt will continue to benefit from skilled PT services to progress strength, gait and balance and reduced pain to optimize function and reduced risk of falls.  OBJECTIVE IMPAIRMENTS:  Abnormal gait, decreased balance, decreased endurance, decreased mobility, difficulty walking, decreased ROM, decreased strength, hypomobility, impaired flexibility, impaired sensation, and pain.    ACTIVITY LIMITATIONS: carrying, lifting, bending, standing, squatting, stairs, and locomotion level   PARTICIPATION LIMITATIONS: cleaning, shopping, community activity, and yard work   PERSONAL FACTORS: Age, Fitness, Time since onset of injury/illness/exacerbation, and 3+ comorbidities: h/o of prostate cancer, hypertension, T2DM  are also affecting patient's functional outcome.    REHAB POTENTIAL: Fair severity of symptoms and chronicity of condition    CLINICAL DECISION MAKING: Stable/uncomplicated   EVALUATION COMPLEXITY: Low     GOALS: Goals reviewed with patient? No   SHORT TERM GOALS: Target date: 12/02/2021   Pt will be independent with HEP in order to improve strength and balance in order to decrease fall risk and improve function at home and work.   Baseline: Completing independently  Goal status: Ongoing    2.  Pt will decrease 5TSTS by at least 3 seconds in order to demonstrate clinically significant improvement in LE strengt Baseline: 22 sec   12/09/21: 12 sec  Goal status: Achieved    3.  Pt will increase 6MWT by at least 72m(1675f in order to demonstrate clinically significant improvement in cardiopulmonary endurance and community ambulation Baseline: 725 ft with use of quad cane   12/21/21: 750 ft without quad cane  Goal status: Ongoing      LONG TERM GOALS: Target date: 01/27/2022   Patient will have improved function and activity level as evidenced by an increase in FOTO score by 10 points or more.  Baseline: 54/100 with target of 100  12/14/21: 57/100  12/21/21: 62/60 Goal status: Achieved    2.  Patient will perform 5 x STS in  <12 sec to demonstrate improved LE strength and to decrease risk of falling.   Baseline: 22 sec   12/09/21: 12 sec 12/21/21: 12 sec Goal  status: Partially Met    3.  Patient will perform >=11 reps on 30 sec chair stands to demonstrate improve LE endurance and to decrease risk of falling  Baseline: 7 reps  12/09/21: 10 reps 12/21/21: 10 reps  Goal status: Partially Met    4.  Patient will demonstrate reduced falls risk as evidenced by Dynamic Gait Index (DGI) >19/24. Baseline: 14/24   12/14/21: 20/24 Goal status: Achieved    5.  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   PLAN FOR NEXT SESSION:  Continue with circuit training    DBradly ChrisPT, DPT Physical Therapist- [facility] 01/19/2022, 1:34 PM

## 2022-01-20 ENCOUNTER — Ambulatory Visit: Payer: PPO | Admitting: Physical Therapy

## 2022-01-20 ENCOUNTER — Encounter: Payer: Self-pay | Admitting: Physical Therapy

## 2022-01-20 DIAGNOSIS — R262 Difficulty in walking, not elsewhere classified: Secondary | ICD-10-CM

## 2022-01-20 DIAGNOSIS — M5459 Other low back pain: Secondary | ICD-10-CM

## 2022-01-20 NOTE — Therapy (Signed)
OUTPATIENT PHYSICAL THERAPY TREATMENT NOTE   Patient Name: Cristian Martin MRN: [id number] DOB:Apr 25, 1945, 76 y.o., male Today's Date: 01/20/2022 [facility]CP:  [facility] not [facility]  REFERRING PROVIDER: Dr. Meade Maw  END OF SESSION:   PT End of Session - 01/20/22 0919     Visit Number 19    Number of Visits 21    Date for PT Re-Evaluation 01/27/22    Authorization Type [employer]    Authorization Time Period 11/18/21-01/27/22    Authorization - Visit Number 25    Authorization - Number of Visits 21    Progress Note Due on Visit 20    PT Start Time 0930    PT Stop Time 1015    PT Time Calculation (min) 45 min    Equipment Utilized During Treatment Gait belt;Back brace    Activity Tolerance Patient tolerated treatment well    Behavior During Therapy WFL for tasks assessed/performed              Past Medical History:  Diagnosis Date   Agent orange exposure    Coronary artery disease    a. 04/2019 Cath: LM 99, LAD 80p, D2 70, LCX 50ost/p, RCA 70p, EF 55-65%; b. 04/2019 CABG x 3 (LIMA to LAD, SVG to OM1, SVG to RCA).   Diabetic peripheral neuropathy (HCC)    Diastolic dysfunction    a 04/2019 Echo: EF 55-60%, mod conc LVH, Gr1 DD. Nl RV fxn. Mild Ao sclerosis w/o stenosis.   GERD (gastroesophageal reflux disease)    Hyperlipidemia    Hyperplasia of prostate with lower urinary tract symptoms (LUTS)    Hypertension    Prostate cancer [facility]) urologist-- dr ottelin/  oncologist-- dr Tammi Klippel   dx 02-21-2018  -- Stage T1c, Gleason 3+4   Type 2 diabetes mellitus treated with insulin ([place])    followed by [facility] in [place]   Wears dentures    upper    Wears glasses    "wears to help double vision"   Past Surgical History:  Procedure Laterality Date   COLONOSCOPY WITH PROPOFOL N/A 08/16/2019   Procedure: COLONOSCOPY WITH PROPOFOL;  Surgeon: Lin Landsman, MD;  Location: [place];  Service: Gastroenterology;  Laterality: N/A;   CORONARY ARTERY BYPASS GRAFT  N/A 04/30/2019   Procedure: CORONARY ARTERY BYPASS GRAFTING (CABG) TIMES THREE USING LEFT INTERNAL MAMMARY AND RIGHT GREATER SAPHENOUS VEIN HARVESTED ENDOSCOPICALLY. MAMMARY ARTERY TO LAD, SAPHENOUS VEIN GRAFT TO  OM1, SAPHENOUS VEING GRAFT TO RIGHT. BIOPSY OF MEDIASTINAL TISSUE PLACEMENT OF RIGHT FEMORAL A-LINE;  Surgeon: Grace Isaac, MD;  Location: [place];  Service: Open Heart Surgery;  Laterality: N/A;   CYSTOSCOPY N/A 07/14/2018   Procedure: CYSTOSCOPY;  Surgeon: Kathie Rhodes, MD;  Location: [facility];  Service: Urology;  Laterality: N/A;  no seeds in bladder per Dr Karsten Ro   ESOPHAGOGASTRODUODENOSCOPY (EGD) WITH PROPOFOL N/A 08/16/2019   Procedure: ESOPHAGOGASTRODUODENOSCOPY (EGD) WITH PROPOFOL;  Surgeon: Lin Landsman, MD;  Location: [place];  Service: Gastroenterology;  Laterality: N/A;   LEFT HEART CATH AND CORONARY ANGIOGRAPHY N/A 04/27/2019   Procedure: LEFT HEART CATH AND CORONARY ANGIOGRAPHY;  Surgeon: Wellington Hampshire, MD;  Location: [place] [facility];  Service: Cardiovascular;  Laterality: N/A;   PROSTATE BIOPSY  02-21-2018  dr Karsten Ro in office   RADIOACTIVE SEED IMPLANT N/A 07/14/2018   Procedure: RADIOACTIVE SEED IMPLANT/BRACHYTHERAPY IMPLANT;  Surgeon: Kathie Rhodes, MD;  Location: [place];  Service: Urology;  Laterality: N/A;  76 seeds  SHOULDER ARTHROSCOPY Left 2018   in [place]   "remove spur"   SPACE OAR INSTILLATION N/A 07/14/2018   Procedure: SPACE OAR INSTILLATION;  Surgeon: Kathie Rhodes, MD;  Location: [facility];  Service: Urology;  Laterality: N/A;   TEE WITHOUT CARDIOVERSION N/A 04/30/2019   Procedure: TRANSESOPHAGEAL ECHOCARDIOGRAM (TEE);  Surgeon: Grace Isaac, MD;  Location: [place];  Service: Open Heart Surgery;  Laterality: N/A;   Patient Active Problem List   Diagnosis Date Noted   Paresthesia of both feet 04/09/2021   Spinal stenosis, lumbar region, with neurogenic claudication  03/03/2021   Chronic radicular lumbar pain 03/03/2021   Lumbar degenerative disc disease 03/03/2021   Lumbar facet arthropathy 03/03/2021   Chronic pain syndrome 03/03/2021   Iron deficiency anemia    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   Coronary artery disease 04/30/2019   Unstable angina ([place]) 04/27/2019   Malignant neoplasm of prostate ([place]) 04/09/2018   GERD (gastroesophageal reflux disease) 04/07/2018   Hx of colonic polyps 04/07/2018   Benign essential hypertension 12/02/2015   Hyperlipidemia, mixed 05/02/2015   Chronic painful diabetic neuropathy ([place]) 03/13/2015   DM (diabetes mellitus) type II controlled with renal manifestation ([place]) 08/08/2013    REFERRING DIAG:  \P95.093 (ICD-10-CM) - Neurogenic claudication due to lumbar spinal stenosis M51.16 (ICD-10-CM) - Lumbar disc herniation with radiculopathy   THERAPY DIAG:  Other low back pain  Difficulty in walking, not elsewhere classified  Rationale for Evaluation and Treatment Rehabilitation  PERTINENT HISTORY: Pt reports experiencing pain in his low back that spreads to his buttocks especially when standing up and walking. He is unable to walk for long periods of time because of back pain and he needs to take a break. He now uses a quad cane because of his balance. He describes a decline in his function after retiring at 76 years old. He to have a heart bypass.   PRECAUTIONS: Falls   SUBJECTIVE:                                                                                                                                                                                      SUBJECTIVE STATEMENT:  Pt reports increased soreness after last session.  PAIN:  Are you having pain? Yes: NPRS scale: 4/10 Pain location: L4 CPA  Pain description: Achy  Aggravating factors: Waking up in morning  Relieving factors: Wearing back brace    OBJECTIVE: (objective measures completed at initial evaluation unless otherwise dated)               VITALS: BP 185/79 HR 79    DIAGNOSTIC FINDINGS:  CLINICAL DATA:  Low back pain,  pain radiates down both legs. No known injury.   EXAM: MRI LUMBAR SPINE WITHOUT CONTRAST   TECHNIQUE: Multiplanar, multisequence MR imaging of the lumbar spine was performed. No intravenous contrast was administered.   COMPARISON:  12/16/2020   FINDINGS: Segmentation:  Standard.   Alignment:  Physiologic.   Vertebrae: No acute fracture, evidence of discitis, or aggressive bone lesion.   Conus medullaris and cauda equina: Conus extends to the L1 level. Conus and cauda equina appear normal.   Paraspinal and other soft tissues: No acute paraspinal abnormality.   Disc levels:   Disc spaces: Degenerative disease with disc height loss at L1-2, L2-3, L3-4, L4-5 and L5-S1.   T12-L1: No significant disc bulge. No neural foraminal stenosis. No central canal stenosis.   L1-L2: Mild broad-based disc bulge. Mild spinal stenosis. No foraminal stenosis.   L2-L3: Broad-based disc osteophyte complex flattening the ventral thecal sac. Mild spinal stenosis. No foraminal stenosis.   L3-L4: Broad-based disc osteophyte complex. Mild spinal stenosis. Mild bilateral facet arthropathy. No right foraminal stenosis. Mild left foraminal stenosis.   L4-L5: Broad-based disc osteophyte complex with a broad small central disc protrusion. Moderate bilateral facet arthropathy. Severe spinal stenosis. Severe left and moderate right foraminal stenosis.   L5-S1: Broad-based disc osteophyte complex. Moderate right and mild left facet arthropathy. Prominence of the epidural fat deforming the thecal sac as can be seen with epidural lipomatosis. No spinal stenosis. Moderate bilateral foraminal stenosis.   IMPRESSION: 1. At L4-5 there is a broad-based disc osteophyte complex with a broad small central disc protrusion. Moderate bilateral facet arthropathy. Severe spinal stenosis. Severe left and moderate right foraminal  stenosis. 2. At L5-S1 there is a broad-based disc osteophyte complex. Moderate right and mild left facet arthropathy. Prominence of the epidural fat deforming the thecal sac as can be seen with epidural lipomatosis. No spinal stenosis. Moderate bilateral foraminal stenosis. 3. Otherwise, lumbar spine spondylosis as described above. Overall, no significant interval change compared with the prior exam. 4.  No acute osseous injury of the lumbar spine.     Electronically Signed   By: Kathreen Devoid M.D.   On: 10/26/2021 14:42     PATIENT SURVEYS:  FOTO 54/100   SCREENING FOR RED FLAGS: Bowel or bladder incontinence: No Spinal tumors: No Cauda equina syndrome: No Compression fracture: No Abdominal aneurysm: No   COGNITION:           Overall cognitive status: Within functional limits for tasks assessed                          SENSATION: Neuropathy in feet    MUSCLE LENGTH: Hamstrings: Right 70 deg; Left 70 deg (muscular restriction in hamstrings) Thomas test: Negative bilaterally    POSTURE: No Significant postural limitations   PALPATION: L4-L5 central spinous process    LUMBAR ROM:    AROM eval  Flexion 75%*  Extension 50%*  Right lateral flexion 100%  Left lateral flexion 100%  Right rotation 100%  Left rotation 100%   (Blank rows = not tested)   LOWER EXTREMITY ROM:      Active  Right 11/18/2021 Left 11/18/2021  Hip flexion 120 120  Hip extension 30 30  Hip abduction 45 45  Hip adduction 30 30  Hip internal rotation 45 45  Hip external rotation 45 45  Knee flexion 135 135  Knee extension 0 0  Ankle dorsiflexion 20 20  Ankle plantarflexion 50 50  Ankle inversion  Ankle eversion       (Blank rows = not tested)        LOWER EXTREMITY MMT:     MMT Right eval Left eval  Hip flexion 5 5  Hip extension 3+ 3+  Hip abduction 5 5  Hip adduction 3+ 3+  Hip internal rotation      Hip external rotation      Knee flexion 5 5  Knee extension 5 5   Ankle dorsiflexion 5 5  Ankle plantarflexion      Ankle inversion      Ankle eversion       (Blank rows = not tested)   LUMBAR SPECIAL TESTS:  Straight leg raise test: Negative, FABER test: NT, Thomas test: Positive, and FADIR: NT    FUNCTIONAL TESTS:  5 times sit to stand: 22 sec  30 seconds chair stand test 6 minute walk test: 725 ft with quad cane  10 meter walk test: nt Dynamic Gait Index: 14/24   GAIT: Distance walked: 50 ft  Assistive device utilized: Quad cane large base Level of assistance: Modified independence Comments: Decreased step length and stance time       TODAY'S TREATMENT:  [facility] Adult PT Treatment:   DATE:   01/20/22: Nu-Step Level 3 with seat and arms at 9 for 5 min  51mT: 1050  -RPE: 8/10  -HR 98  OMEGA Leg Press #65 2 x 10  OMEGA Leg Press #75 1 x 10    01/19/22:  BP 143/73 Nu-Step Level 3 with seat and arms at 9 for 5 min  Overground walking 500 ft  Sit to Stand without UE support 1 x 10  Overground walking 500 ft with three 1 ft obstacles  Sit to Stand without UE support 1 x 10  Lateral steps 50 ft x 2, backwards x 100 ft, forward x 100 ft for total 5x  Heel Raises x 15  Standing Marches 2 x 10  Fast walk on straight aways 150 ft x 2, walk slow on ends x 50 ft x 2 for total of 500 ft   01/14/22  Vitals seated BP in LUE mechanical prior to session:  187/85 mm Hg, HR: 82 BPM     Neuro Re-Ed:   Obstacle course: x2 airex pad step up and overs --> airex beam side steps --> x3 hurdle step through pattern step overs: x4 laps. CGA.    Vitals seated BP in LUE mechanical:   183/83 mm HG, HR: 85 BPM    Obstacle course forwards: amb over unstable surface (mat with porcupine balls underneath it)  stepping over 2 hurdles -->  6" step up and over -->  airex pad step up and over x2: x4 CGA. 4# AW's on LE's during. Intermittent stepping strategy to correct LOB.    300' gait with 4# AW's: performing horizontal and vertical head turns, gait  speed changes, ambulating sideways and backwards upon PT request to work on reaction time and prevent pt from predictability of task.   Vitals seated BP in LUE mechanical:   180/83 mm Hg, HR; 86 BPM  100' gait with 4# AW's. Forwards --> R side steps --> backwards --> L side steps. CGA. Limited step through pattern with backwards gait despite VC's.     Educated pt on contacting PCP on elevated BP. Re-educated pt on signs/symptoms of stroke and need for emergency services if symptoms occur. Pt verbalizing plan to call PCP.    PATIENT EDUCATION:  Education details:  form and technique and explanation about deficits  Person educated: Patient Education method: Explanation, Demonstration, Tactile cues, Verbal cues, and Handouts Education comprehension: verbalized understanding, returned demonstration, verbal cues required, and tactile cues required     HOME EXERCISE PROGRAM: Access Code: [email or web address] URL: [email or web address] Date: 01/20/2022 Prepared by: Bradly Chris  Exercises - Supine Lower Trunk Rotation  - 1 x daily - 3 sets - 10 reps - Supine Single Knee to Chest Stretch  - 1 x daily - 2 sets - 10 reps - 3 hold - Prone Quadriceps Stretch with Strap  - 1 x daily - 3 reps - 60 hold - Sit to Stand with Arms Crossed  - 3 x weekly - 3 sets - 15 reps - Curl Up with Arms Crossed  - 3 x weekly - 3 sets - 10 reps - Standing Hip Abduction with Counter Support  - 3 x weekly - 3 sets - 10 reps - Full Leg Press  - 3 x weekly - 3 sets - 10 reps  ASSESSMENT:   CLINICAL IMPRESSION: Pt exhibits an improvement in aerobic endurance with increase in distance on 52mT. His LE strength and endurance continue to remain same despite ongoing PT. Large amount of session devoted to discussing exercise options outside of PT to have pt successfully transition to exercising independently. [facility]. Next session to focus on LE endurance and strength  exercises with transition away from aerobic endurance. He will continue to benefit from skilled PT services to progress strength, gait and balance and reduced pain to optimize function and reduced risk of falls.  OBJECTIVE IMPAIRMENTS: Abnormal gait, decreased balance, decreased endurance, decreased mobility, difficulty walking, decreased ROM, decreased strength, hypomobility, impaired flexibility, impaired sensation, and pain.    ACTIVITY LIMITATIONS: carrying, lifting, bending, standing, squatting, stairs, and locomotion level   PARTICIPATION LIMITATIONS: cleaning, shopping, community activity, and yard work   PERSONAL FACTORS: Age, Fitness, Time since onset of injury/illness/exacerbation, and 3+ comorbidities: h/o of prostate cancer, hypertension, T2DM  are also affecting patient's functional outcome.    REHAB POTENTIAL: Fair severity of symptoms and chronicity of condition    CLINICAL DECISION MAKING: Stable/uncomplicated   EVALUATION COMPLEXITY: Low     GOALS: Goals reviewed with patient? No   SHORT TERM GOALS: Target date: 12/02/2021   Pt will be independent with HEP in order to improve strength and balance in order to decrease fall risk and improve function at home and work.   Baseline: Completing independently  Goal status: Ongoing    2.  Pt will decrease 5TSTS by at least 3 seconds in order to demonstrate clinically significant improvement in LE strengt Baseline: 22 sec   12/09/21: 12 sec  Goal status: Achieved    3.  Pt will increase 6MWT by at least [id number] order to demonstrate clinically significant improvement in cardiopulmonary endurance and community ambulation Baseline: 725 ft with use of quad cane   12/21/21: 750 ft without quad cane 01/20/22: 1050 ft  Goal status: Achieved      LONG TERM GOALS: Target date: 01/27/2022   Patient will have improved function and activity level as evidenced by an increase in FOTO score by 10 points or more.  Baseline: 54/100  with target of 100  12/14/21: 57/100  12/21/21: 62/60 Goal status: Achieved    2.  Patient will perform 5 x STS in  <12 sec to demonstrate improved LE strength and to decrease risk of falling.  Baseline: 22 sec   12/09/21: 12 sec 12/21/21: 12 sec 01/23/22: 12 sec  Goal status: Partially Met    3.  Patient will perform >=11 reps on 30 sec chair stands to demonstrate improve LE endurance and to decrease risk of falling  Baseline: 7 reps   12/09/21: 10 reps 12/21/21: 10 reps 01/20/22: 10 reps  Goal status: Partially Met    4.  Patient will demonstrate reduced falls risk as evidenced by Dynamic Gait Index (DGI) >19/24. Baseline: 14/24   12/14/21: 20/24 Goal status: Achieved    5.  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   PLAN FOR NEXT SESSION:  Progress LE strengthening and endurance exercises. Continue with OMEGA machines. Progress note next session.    DBradly ChrisPT, DPT Physical Therapist- [facility] 01/20/2022, 9:20 AM

## 2022-01-21 ENCOUNTER — Encounter: Payer: No Typology Code available for payment source | Admitting: Physical Therapy

## 2022-01-27 ENCOUNTER — Ambulatory Visit: Payer: PPO | Admitting: Physical Therapy

## 2022-01-27 ENCOUNTER — Encounter: Payer: Self-pay | Admitting: Physical Therapy

## 2022-01-27 DIAGNOSIS — R262 Difficulty in walking, not elsewhere classified: Secondary | ICD-10-CM

## 2022-01-27 DIAGNOSIS — M5459 Other low back pain: Secondary | ICD-10-CM

## 2022-01-27 NOTE — Therapy (Signed)
OUTPATIENT PHYSICAL THERAPY PROGRESS NOTE AND DISCHARGE SUMMARY   Patient Name: Cristian Martin MRN: [id number] DOB:Dec 18, 1945, 76 y.o., male Today's Date: 01/27/2022 PCP:  [facility] not listed  REFERRING PROVIDER: Dr. Meade Maw  END OF SESSION:   PT End of Session - 01/27/22 1015     Visit Number 20    Number of Visits 21    Date for PT Re-Evaluation 01/27/22    Authorization Type [employer]    Authorization Time Period 11/18/21-01/27/22    Authorization - Visit Number 31    Authorization - Number of Visits 21    Progress Note Due on Visit 20    PT Start Time 1015    PT Stop Time 1100    PT Time Calculation (min) 45 min    Equipment Utilized During Treatment Gait belt;Back brace    Activity Tolerance Patient tolerated treatment well    Behavior During Therapy WFL for tasks assessed/performed               Past Medical History:  Diagnosis Date   Agent orange exposure    Coronary artery disease    a. 04/2019 Cath: LM 99, LAD 80p, D2 70, LCX 50ost/p, RCA 70p, EF 55-65%; b. 04/2019 CABG x 3 (LIMA to LAD, SVG to OM1, SVG to RCA).   Diabetic peripheral neuropathy (HCC)    Diastolic dysfunction    a 04/2019 Echo: EF 55-60%, mod conc LVH, Gr1 DD. Nl RV fxn. Mild Ao sclerosis w/o stenosis.   GERD (gastroesophageal reflux disease)    Hyperlipidemia    Hyperplasia of prostate with lower urinary tract symptoms (LUTS)    Hypertension    Prostate cancer [facility]) urologist-- dr ottelin/  oncologist-- dr Tammi Klippel   dx 02-21-2018  -- Stage T1c, Gleason 3+4   Type 2 diabetes mellitus treated with insulin ([place])    followed by [facility] in [place]   Wears dentures    upper    Wears glasses    "wears to help double vision"   Past Surgical History:  Procedure Laterality Date   COLONOSCOPY WITH PROPOFOL N/A 08/16/2019   Procedure: COLONOSCOPY WITH PROPOFOL;  Surgeon: Lin Landsman, MD;  Location: [place];  Service: Gastroenterology;  Laterality: N/A;   CORONARY  ARTERY BYPASS GRAFT N/A 04/30/2019   Procedure: CORONARY ARTERY BYPASS GRAFTING (CABG) TIMES THREE USING LEFT INTERNAL MAMMARY AND RIGHT GREATER SAPHENOUS VEIN HARVESTED ENDOSCOPICALLY. MAMMARY ARTERY TO LAD, SAPHENOUS VEIN GRAFT TO  OM1, SAPHENOUS VEING GRAFT TO RIGHT. BIOPSY OF MEDIASTINAL TISSUE PLACEMENT OF RIGHT FEMORAL A-LINE;  Surgeon: Grace Isaac, MD;  Location: [facility] [place];  Service: Open Heart Surgery;  Laterality: N/A;   CYSTOSCOPY N/A 07/14/2018   Procedure: CYSTOSCOPY;  Surgeon: Kathie Rhodes, MD;  Location: [facility];  Service: Urology;  Laterality: N/A;  no seeds in bladder per Dr Karsten Ro   ESOPHAGOGASTRODUODENOSCOPY (EGD) WITH PROPOFOL N/A 08/16/2019   Procedure: ESOPHAGOGASTRODUODENOSCOPY (EGD) WITH PROPOFOL;  Surgeon: Lin Landsman, MD;  Location: [facility];  Service: Gastroenterology;  Laterality: N/A;   LEFT HEART CATH AND CORONARY ANGIOGRAPHY N/A 04/27/2019   Procedure: LEFT HEART CATH AND CORONARY ANGIOGRAPHY;  Surgeon: Wellington Hampshire, MD;  Location: [facility];  Service: Cardiovascular;  Laterality: N/A;   PROSTATE BIOPSY  02-21-2018  dr Karsten Ro in office   RADIOACTIVE SEED IMPLANT N/A 07/14/2018   Procedure: RADIOACTIVE SEED IMPLANT/BRACHYTHERAPY IMPLANT;  Surgeon: Kathie Rhodes, MD;  Location: [facility];  Service: Urology;  Laterality: N/A;  76 seeds    SHOULDER ARTHROSCOPY Left 2018   in [place]   "remove spur"   SPACE OAR INSTILLATION N/A 07/14/2018   Procedure: SPACE OAR INSTILLATION;  Surgeon: Kathie Rhodes, MD;  Location: [facility];  Service: Urology;  Laterality: N/A;   TEE WITHOUT CARDIOVERSION N/A 04/30/2019   Procedure: TRANSESOPHAGEAL ECHOCARDIOGRAM (TEE);  Surgeon: Grace Isaac, MD;  Location: [facility];  Service: Open Heart Surgery;  Laterality: N/A;   Patient Active Problem List   Diagnosis Date Noted   Paresthesia of both feet 04/09/2021   Spinal stenosis, lumbar region, with neurogenic  claudication 03/03/2021   Chronic radicular lumbar pain 03/03/2021   Lumbar degenerative disc disease 03/03/2021   Lumbar facet arthropathy 03/03/2021   Chronic pain syndrome 03/03/2021   Iron deficiency anemia    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   Coronary artery disease 04/30/2019   Unstable angina ([place]) 04/27/2019   Malignant neoplasm of prostate ([place]) 04/09/2018   GERD (gastroesophageal reflux disease) 04/07/2018   Hx of colonic polyps 04/07/2018   Benign essential hypertension 12/02/2015   Hyperlipidemia, mixed 05/02/2015   Chronic painful diabetic neuropathy ([place]) 03/13/2015   DM (diabetes mellitus) type II controlled with renal manifestation ([place]) 08/08/2013    REFERRING DIAG:  \J82.505 (ICD-10-CM) - Neurogenic claudication due to lumbar spinal stenosis M51.16 (ICD-10-CM) - Lumbar disc herniation with radiculopathy   THERAPY DIAG:  Other low back pain  Difficulty in walking, not elsewhere classified  Rationale for Evaluation and Treatment Rehabilitation  PERTINENT HISTORY: Pt reports experiencing pain in his low back that spreads to his buttocks especially when standing up and walking. He is unable to walk for long periods of time because of back pain and he needs to take a break. He now uses a quad cane because of his balance. He describes a decline in his function after retiring at 76 years old. He to have a heart bypass.   PRECAUTIONS: Falls   SUBJECTIVE:                                                                                                                                                                                      SUBJECTIVE STATEMENT:  Pt states that he has not been using cane except for longer walks and when walking over uneven surfaces.  PAIN:  Are you having pain? Yes: NPRS scale: 4/10 Pain location: L4 CPA  Pain description: Achy  Aggravating factors: Waking up in morning  Relieving factors: Wearing back brace    OBJECTIVE: (objective measures  completed at initial evaluation unless otherwise dated)  VITALS: BP 185/79 HR 79    DIAGNOSTIC FINDINGS:  CLINICAL DATA:  Low back pain, pain radiates down both legs. No known injury.   EXAM: MRI LUMBAR SPINE WITHOUT CONTRAST   TECHNIQUE: Multiplanar, multisequence MR imaging of the lumbar spine was performed. No intravenous contrast was administered.   COMPARISON:  12/16/2020   FINDINGS: Segmentation:  Standard.   Alignment:  Physiologic.   Vertebrae: No acute fracture, evidence of discitis, or aggressive bone lesion.   Conus medullaris and cauda equina: Conus extends to the L1 level. Conus and cauda equina appear normal.   Paraspinal and other soft tissues: No acute paraspinal abnormality.   Disc levels:   Disc spaces: Degenerative disease with disc height loss at L1-2, L2-3, L3-4, L4-5 and L5-S1.   T12-L1: No significant disc bulge. No neural foraminal stenosis. No central canal stenosis.   L1-L2: Mild broad-based disc bulge. Mild spinal stenosis. No foraminal stenosis.   L2-L3: Broad-based disc osteophyte complex flattening the ventral thecal sac. Mild spinal stenosis. No foraminal stenosis.   L3-L4: Broad-based disc osteophyte complex. Mild spinal stenosis. Mild bilateral facet arthropathy. No right foraminal stenosis. Mild left foraminal stenosis.   L4-L5: Broad-based disc osteophyte complex with a broad small central disc protrusion. Moderate bilateral facet arthropathy. Severe spinal stenosis. Severe left and moderate right foraminal stenosis.   L5-S1: Broad-based disc osteophyte complex. Moderate right and mild left facet arthropathy. Prominence of the epidural fat deforming the thecal sac as can be seen with epidural lipomatosis. No spinal stenosis. Moderate bilateral foraminal stenosis.   IMPRESSION: 1. At L4-5 there is a broad-based disc osteophyte complex with a broad small central disc protrusion. Moderate bilateral  facet arthropathy. Severe spinal stenosis. Severe left and moderate right foraminal stenosis. 2. At L5-S1 there is a broad-based disc osteophyte complex. Moderate right and mild left facet arthropathy. Prominence of the epidural fat deforming the thecal sac as can be seen with epidural lipomatosis. No spinal stenosis. Moderate bilateral foraminal stenosis. 3. Otherwise, lumbar spine spondylosis as described above. Overall, no significant interval change compared with the prior exam. 4.  No acute osseous injury of the lumbar spine.     Electronically Signed   By: Kathreen Devoid M.D.   On: 10/26/2021 14:42     PATIENT SURVEYS:  FOTO 54/100   SCREENING FOR RED FLAGS: Bowel or bladder incontinence: No Spinal tumors: No Cauda equina syndrome: No Compression fracture: No Abdominal aneurysm: No   COGNITION:           Overall cognitive status: Within functional limits for tasks assessed                          SENSATION: Neuropathy in feet    MUSCLE LENGTH: Hamstrings: Right 70 deg; Left 70 deg (muscular restriction in hamstrings) Thomas test: Negative bilaterally    POSTURE: No Significant postural limitations   PALPATION: L4-L5 central spinous process    LUMBAR ROM:    AROM eval  Flexion 75%*  Extension 50%*  Right lateral flexion 100%  Left lateral flexion 100%  Right rotation 100%  Left rotation 100%   (Blank rows = not tested)   LOWER EXTREMITY ROM:      Active  Right 11/18/2021 Left 11/18/2021  Hip flexion 120 120  Hip extension 30 30  Hip abduction 45 45  Hip adduction 30 30  Hip internal rotation 45 45  Hip external rotation 45 45  Knee flexion 135 135  Knee  extension 0 0  Ankle dorsiflexion 20 20  Ankle plantarflexion 50 50  Ankle inversion      Ankle eversion       (Blank rows = not tested)        LOWER EXTREMITY MMT:     MMT Right eval Left eval  Hip flexion 5 5  Hip extension 3+ 3+  Hip abduction 5 5  Hip adduction 3+ 3+  Hip  internal rotation      Hip external rotation      Knee flexion 5 5  Knee extension 5 5  Ankle dorsiflexion 5 5  Ankle plantarflexion      Ankle inversion      Ankle eversion       (Blank rows = not tested)   LUMBAR SPECIAL TESTS:  Straight leg raise test: Negative, FABER test: NT, Thomas test: Positive, and FADIR: NT    FUNCTIONAL TESTS:  5 times sit to stand: 22 sec  30 seconds chair stand test 6 minute walk test: 725 ft with quad cane  10 meter walk test: nt Dynamic Gait Index: 14/24   GAIT: Distance walked: 50 ft  Assistive device utilized: Quad cane large base Level of assistance: Modified independence Comments: Decreased step length and stance time       TODAY'S TREATMENT:  [facility] Adult PT Treatment:   DATE:   01/27/22: Nu-Step Level 2 with seat and arms at 9 for 5 min              OMEGA Knee Ext #20 1 x 10             OMEGA Knee Ext #25 1 x 10               OMEGA HS Curl #25 1 x 10                            Discussed exercise plan along with walking program so that patient has a clear idea of how he will continue to maintain strength and mobility gains.   01/20/22: Nu-Step Level 3 with seat and arms at 9 for 5 min  63mT: 1050  -RPE: 8/10  -HR 98  OMEGA Leg Press #65 2 x 10  OMEGA Leg Press #75 1 x 10    01/19/22:  BP 143/73 Nu-Step Level 3 with seat and arms at 9 for 5 min  Overground walking 500 ft  Sit to Stand without UE support 1 x 10  Overground walking 500 ft with three 1 ft obstacles  Sit to Stand without UE support 1 x 10  Lateral steps 50 ft x 2, backwards x 100 ft, forward x 100 ft for total 5x  Heel Raises x 15  Standing Marches 2 x 10  Fast walk on straight aways 150 ft x 2, walk slow on ends x 50 ft x 2 for total of 500 ft   01/14/22  Vitals seated BP in LUE mechanical prior to session:  187/85 mm Hg, HR: 82 BPM     Neuro Re-Ed:   Obstacle course: x2 airex pad step up and overs --> airex beam side steps --> x3 hurdle step  through pattern step overs: x4 laps. CGA.    Vitals seated BP in LUE mechanical:   183/83 mm HG, HR: 85 BPM    Obstacle course forwards: amb over unstable surface (mat with porcupine balls underneath it)  stepping over 2 hurdles -->  6" step up and over -->  airex pad step up and over x2: x4 CGA. 4# AW's on LE's during. Intermittent stepping strategy to correct LOB.    300' gait with 4# AW's: performing horizontal and vertical head turns, gait speed changes, ambulating sideways and backwards upon PT request to work on reaction time and prevent pt from predictability of task.   Vitals seated BP in LUE mechanical:   180/83 mm Hg, HR; 86 BPM  100' gait with 4# AW's. Forwards --> R side steps --> backwards --> L side steps. CGA. Limited step through pattern with backwards gait despite VC's.     Educated pt on contacting PCP on elevated BP. Re-educated pt on signs/symptoms of stroke and need for emergency services if symptoms occur. Pt verbalizing plan to call PCP.    PATIENT EDUCATION:  Education details: form and technique and explanation about deficits  Person educated: Patient Education method: Explanation, Demonstration, Tactile cues, Verbal cues, and Handouts Education comprehension: verbalized understanding, returned demonstration, verbal cues required, and tactile cues required     HOME EXERCISE PROGRAM: Access Code: [email or web address] URL: [email or web address] Date: 01/27/2022 Prepared by: Bradly Chris  Exercises - Supine Lower Trunk Rotation  - 1 x daily - 3 sets - 10 reps - Supine Single Knee to Chest Stretch  - 1 x daily - 2 sets - 10 reps - 3 hold - Prone Quadriceps Stretch with Strap  - 1 x daily - 3 reps - 60 hold - Sit to Stand with Arms Crossed  - 3 x weekly - 3 sets - 15 reps - Curl Up with Arms Crossed  - 3 x weekly - 3 sets - 10 reps - Standing Hip Abduction with Counter Support  - 3 x weekly - 3 sets - 10 reps - Full Leg Press  - 3 x weekly - 3 sets -  10 reps - Knee Extension with Weight Machine  - 3 x weekly - 3 sets - 10 reps - Hamstring Curl with Weight Machine  - 3 x weekly - 3 sets - 10 reps  ASSESSMENT:   CLINICAL IMPRESSION: Pt has nearly met all his goals with exception of LE strength and endurance where he has continued to plateau with his progress. Given progress towards goals and lack of ongoing progress, pt is ready for discharge to perform HEP independently to maintain functional gains. He will seek out additional medical eval and treatment to address ongoing neurogenic claudication, which has not resolved with PT and he has an apt scheduled with neurosurgeon in upcoming new year.    OBJECTIVE IMPAIRMENTS: Abnormal gait, decreased balance, decreased endurance, decreased mobility, difficulty walking, decreased ROM, decreased strength, hypomobility, impaired flexibility, impaired sensation, and pain.    ACTIVITY LIMITATIONS: carrying, lifting, bending, standing, squatting, stairs, and locomotion level   PARTICIPATION LIMITATIONS: cleaning, shopping, community activity, and yard work   PERSONAL FACTORS: Age, Fitness, Time since onset of injury/illness/exacerbation, and 3+ comorbidities: h/o of prostate cancer, hypertension, T2DM  are also affecting patient's functional outcome.    REHAB POTENTIAL: Fair severity of symptoms and chronicity of condition    CLINICAL DECISION MAKING: Stable/uncomplicated   EVALUATION COMPLEXITY: Low     GOALS: Goals reviewed with patient? No   SHORT TERM GOALS: Target date: 12/02/2021   Pt will be independent with HEP in order to improve strength and balance in order to decrease fall risk and improve function at home and work.   Baseline: Completing independently  Goal status: Ongoing  2.  Pt will decrease 5TSTS by at least 3 seconds in order to demonstrate clinically significant improvement in LE strengt Baseline: 22 sec   12/09/21: 12 sec  Goal status: Achieved    3.  Pt will increase  6MWT by at least 48m(16107f in order to demonstrate clinically significant improvement in cardiopulmonary endurance and community ambulation Baseline: 725 ft with use of quad cane   12/21/21: 750 ft without quad cane 01/20/22: 1050 ft  Goal status: Achieved      LONG TERM GOALS: Target date: 01/27/2022   Patient will have improved function and activity level as evidenced by an increase in FOTO score by 10 points or more.  Baseline: 54/100 with target of 100  12/14/21: 57/100  12/21/21: 62/60 Goal status: Achieved    2.  Patient will perform 5 x STS in  <12 sec to demonstrate improved LE strength and to decrease risk of falling.   Baseline: 22 sec   12/09/21: 12 sec 12/21/21: 12 sec 01/23/22: 12 sec  01/27/22: 14 sec  Goal status: Partially Met    3.  Patient will perform >=11 reps on 30 sec chair stands to demonstrate improve LE endurance and to decrease risk of falling  Baseline: 7 reps   12/09/21: 10 reps 12/21/21: 10 reps 01/20/22: 10 reps 01/27/22: 10 reps  Goal status: Partially Met    4.  Patient will demonstrate reduced falls risk as evidenced by Dynamic Gait Index (DGI) >19/24. Baseline: 14/24   12/14/21: 20/24 Goal status: Achieved    5.  Patient will ambulate >=1,000 ft during 70m51mto show improved aerobic endurance and ability to walk distance that signifies he is a comHydrographic surveyoraseline: 725 ft with use of quad cane 12/21/21: 750 ft 01/20/22: 1,050 ft  Goal status: Achieved      PLAN: PT FREQUENCY: 1-2x/week   PT DURATION: 10 weeks   PLANNED INTERVENTIONS: Therapeutic exercises, Neuromuscular re-education, Balance training, Gait training, Patient/Family education, Self Care, Joint mobilization, Joint manipulation, Stair training, Vestibular training, DME instructions, Aquatic Therapy, Dry Needling, Electrical stimulation, Spinal manipulation, Spinal mobilization, Cryotherapy, Moist heat, Traction, Manual therapy, and Re-evaluation.   PLAN FOR NEXT SESSION:   Discharge from PT [place], DPT Physical Therapist- [facility]/27/2023, 10:16 AM

## 2022-01-27 NOTE — Therapy (Signed)
[facility] [address], [place], [address] Phone: [phone]   Fax:  [phone]  Patient Details  Name: Cristian Martin MRN: [id number] Date of Birth: 11-16-1945 Referring Provider:  Meade Maw, MD  Encounter Date: 01/27/2022   Daneil Dan, PT 01/27/2022, 11:00 AM  [facility] [address] [place], [address] Phone: [phone]   Fax:  [phone]

## 2022-02-12 ENCOUNTER — Ambulatory Visit (INDEPENDENT_AMBULATORY_CARE_PROVIDER_SITE_OTHER): Payer: PPO | Admitting: Neurosurgery

## 2022-02-12 DIAGNOSIS — M48062 Spinal stenosis, lumbar region with neurogenic claudication: Secondary | ICD-10-CM

## 2022-02-12 NOTE — Progress Notes (Signed)
I spoke with Mr. Grieser this morning via telephone visit to review how he has been since his 12/19 visit. Unfortunately he has not been contacted by Dr. Elwyn Lade office for an injection but continues to have back and bilateral buttock pain. He is still interested in this procedure. I will reach out to Dr. Holley Raring to help expedite. He will need approval from community care as he is a [place] patient. His representative's information is Joelene Millin with community care program [phone]. I will see him back in 3-4 weeks via telephone call to discuss how he has done with his injection.

## 2022-03-02 ENCOUNTER — Ambulatory Visit (INDEPENDENT_AMBULATORY_CARE_PROVIDER_SITE_OTHER): Payer: PPO | Admitting: Neurosurgery

## 2022-03-02 DIAGNOSIS — M48062 Spinal stenosis, lumbar region with neurogenic claudication: Secondary | ICD-10-CM

## 2022-03-02 NOTE — Progress Notes (Signed)
I spoke with Mr. Lofaro today  via telephone visit. Unfortunately he has still been unable to get ESIs due to awaiting VA approval for outside services. Chong Sicilian has looked into this and spoken with Junie Panning from the [place]. The request was signed by the [facility] physician for the approval for outside services yesterday morning. the approval was sent to the pain clinic department at the [facility], they will call him to see where he wants to go. He will keep out office updated. His approval for our office goes through 2/18. I will set up another telephone visit with him in a couple of weeks to touch base about where we are in the process. He was encouraged to keep our office updated with any changes or concerns. Cooper Render PA-C

## 2022-03-04 ENCOUNTER — Telehealth: Payer: PPO | Admitting: Neurosurgery

## 2022-03-10 ENCOUNTER — Telehealth: Payer: Self-pay

## 2022-03-10 NOTE — Telephone Encounter (Signed)
Danielle added him on for a telephone visit tomorrow

## 2022-03-10 NOTE — Telephone Encounter (Signed)
-----   Message from [place] sent at 03/10/2022  1:28 PM EST ----- Regarding: Continued pain Complaints of pain has decided he needs to have surgery. Says he is sure that it is his L4-L5. He wants to speak with Andee Poles to discuss his pain and discuss surgical options so he can go to the [place] and ask for an extension on the authorization.

## 2022-03-11 ENCOUNTER — Other Ambulatory Visit
Admission: RE | Admit: 2022-03-11 | Discharge: 2022-03-11 | Disposition: A | Discharge status: Home / Self Care | Payer: No Typology Code available for payment source | Attending: Neurosurgery | Admitting: Neurosurgery

## 2022-03-11 ENCOUNTER — Ambulatory Visit (INDEPENDENT_AMBULATORY_CARE_PROVIDER_SITE_OTHER): Payer: PPO | Admitting: Neurosurgery

## 2022-03-11 DIAGNOSIS — M48062 Spinal stenosis, lumbar region with neurogenic claudication: Secondary | ICD-10-CM

## 2022-03-11 DIAGNOSIS — E119 Type 2 diabetes mellitus without complications: Secondary | ICD-10-CM

## 2022-03-11 LAB — HEMOGLOBIN A1C
Hgb A1c MFr Bld: 9.5 % — ABNORMAL HIGH (ref 4.8–5.6)
Mean Plasma Glucose: 225.95 mg/dL

## 2022-03-11 NOTE — Progress Notes (Signed)
I spoke with Cristian Martin today regarding next steps. He has yet to hear from the [place] regarding his injections and feels like his symptoms are progressing. He would like to move forward with surgical options. He is a type II diabetic and states his last A1c was 7 but it has been several months. I have ordered an updated lab. He is to have this done prior to his appointment with Dr. Izora Ribas to discuss surgery. He was made aware that his A1c will have to be 7 or less to consider surgery. Cooper Render PA-C

## 2022-03-18 ENCOUNTER — Ambulatory Visit (INDEPENDENT_AMBULATORY_CARE_PROVIDER_SITE_OTHER): Payer: PPO | Admitting: Neurosurgery

## 2022-03-18 DIAGNOSIS — E119 Type 2 diabetes mellitus without complications: Secondary | ICD-10-CM

## 2022-03-18 DIAGNOSIS — M48062 Spinal stenosis, lumbar region with neurogenic claudication: Secondary | ICD-10-CM

## 2022-03-18 NOTE — Progress Notes (Signed)
I spoke with Mr. Hayden today regarding his recent A1c. Unfortunately it is 9.5 as of last week. I expressed that this would be too high to consider elective surgery given the increased risk. He is going to work to get his blood sugars under better control. When his A1c is 7.5 or less we will get him schedule to see Dr. Izora Ribas to talk about surgical options. He expressed understanding and was in agreement with this plan. Cooper Render PA-C

## 2022-03-25 ENCOUNTER — Ambulatory Visit: Payer: PPO | Admitting: Neurosurgery

## 2022-08-26 ENCOUNTER — Telehealth: Payer: Self-pay | Admitting: Neurosurgery

## 2022-08-26 NOTE — Telephone Encounter (Signed)
Patient came to the window. His A1c on Monday 7/22 was at 6.8 at the [facility], he will have them fax [place] a copy. He is ready for surgery. Do you need to see him in the office again?

## 2022-08-26 NOTE — Telephone Encounter (Signed)
He needs an appointment with Dr Myer Haff once we receive proof of his A1c result (and a VA referral if one is needed). You can use a new patient slot since he hasn't seen Dr Jeannie Fend since October and it is a surgery discussion.

## 2022-08-27 NOTE — Telephone Encounter (Signed)
A1c results have been requested from the [place]. Patient is aware once the results are in we will schedule with Dr.Yarbrough.

## 2022-08-27 NOTE — Telephone Encounter (Signed)
A1c results scanned and appt made for 09/14/2022. Pending VA auth.

## 2022-09-01 NOTE — Telephone Encounter (Signed)
He is scheduled for 8/13, when I made the appt I don't think it was blocked. Do you need me to reschedule him?

## 2022-09-13 NOTE — H&P (View-Only) (Signed)
Referring Physician:  No referring provider defined for this encounter.  Primary Physician:  [facility]  History of Present Illness: 09/14/2022 Cristian Martin returns to see me.  He has gotten his sugars under control.  He has lost some weight.  He continues to have back and leg pain.  He has trouble with walking.  12/01/2021 Cristian Martin is doing somewhat better.  He is started physical therapy and feels that he is making progress.  10/06/2021 Cristian Martin is here today with a chief complaint of continued pain in his buttocks bilaterally.  He has trouble with walking.  He has been better about his sugars.  History of Present Illness: 04/30/2021 Cristian Martin is here today with a chief complaint of low back pain and bilateral leg pain along with weakness, numbness and tingling.   He has had progressive problems for the past 2 months with aching and nagging pain as bad as 10 out of 10 when he bends, climbs, walks, lifts, or kneels. He can only walk a short period at this point. Rest and laying down makes it better.  Bowel/Bladder Dysfunction: none  Conservative measures:  Physical therapy: has not participated in Multimodal medical therapy including regular antiinflammatories: tylenol, gabapentin, diclofenac gel Injections: has had epidural steroid injections at [facility] (didn't get any relief)  Past Surgery: denies  Cristian Martin has no symptoms of cervical myelopathy.  The symptoms are causing a significant impact on the patient's life.   Review of Systems:  A 10 point review of systems is negative, except for the pertinent positives and negatives detailed in the HPI.  Past Medical History: Past Medical History:  Diagnosis Date   Agent orange exposure    Coronary artery disease    a. 04/2019 Cath: LM 99, LAD 80p, D2 70, LCX 50ost/p, RCA 70p, EF 55-65%; b. 04/2019 CABG x 3 (LIMA to LAD, SVG to OM1, SVG to RCA).   Diabetic peripheral neuropathy  (HCC)    Diastolic dysfunction    a 04/2019 Echo: EF 55-60%, mod conc LVH, Gr1 DD. Nl RV fxn. Mild Ao sclerosis w/o stenosis.   GERD (gastroesophageal reflux disease)    Hyperlipidemia    Hyperplasia of prostate with lower urinary tract symptoms (LUTS)    Hypertension    Prostate cancer [facility]) urologist-- dr ottelin/  oncologist-- dr Kathrynn Running   dx 02-21-2018  -- Stage T1c, Gleason 3+4   Type 2 diabetes mellitus treated with insulin (HCC)    [facility] [place]   Wears dentures    upper    Wears glasses    "wears to help double vision"    Past Surgical History: Past Surgical History:  Procedure Laterality Date   COLONOSCOPY WITH PROPOFOL N/A 08/16/2019   Procedure: COLONOSCOPY WITH PROPOFOL;  Surgeon: Toney Reil, MD;  Location: [facility] ENDOSCOPY;  Service: Gastroenterology;  Laterality: N/A;   CORONARY ARTERY BYPASS GRAFT N/A 04/30/2019   Procedure: CORONARY ARTERY BYPASS GRAFTING (CABG) TIMES THREE USING LEFT INTERNAL MAMMARY AND RIGHT GREATER SAPHENOUS VEIN HARVESTED ENDOSCOPICALLY. MAMMARY ARTERY TO LAD, SAPHENOUS VEIN GRAFT TO  OM1, SAPHENOUS VEING GRAFT TO RIGHT. BIOPSY OF MEDIASTINAL TISSUE PLACEMENT OF RIGHT FEMORAL A-LINE;  Surgeon: Delight Ovens, MD;  Location[facility] OR;  Service: Open Heart Surgery;  Laterality: N/A;   CYSTOSCOPY N/A 07/14/2018   Procedure: CYSTOSCOPY;  Surgeon: Ihor Gully, MD;  Location: [facility];  Service: Urology;  Laterality: N/A;  no seeds in bladder per  Dr Vernie Ammons   ESOPHAGOGASTRODUODENOSCOPY (EGD) WITH PROPOFOL N/A 08/16/2019   Procedure: ESOPHAGOGASTRODUODENOSCOPY (EGD) WITH PROPOFOL;  Surgeon: Toney Reil, MD;  Location: [facility] ENDOSCOPY;  Service: Gastroenterology;  Laterality: N/A;   LEFT HEART CATH AND CORONARY ANGIOGRAPHY N/A 04/27/2019   Procedure: LEFT HEART CATH AND CORONARY ANGIOGRAPHY;  Surgeon: Iran Ouch, MD;  Location: [facility] INVASIVE CV LAB;  Service: Cardiovascular;  Laterality: N/A;   PROSTATE  BIOPSY  02-21-2018  dr Vernie Ammons in office   RADIOACTIVE SEED IMPLANT N/A 07/14/2018   Procedure: RADIOACTIVE SEED IMPLANT/BRACHYTHERAPY IMPLANT;  Surgeon: Ihor Gully, MD;  Location: [facility];  Service: Urology;  Laterality: N/A;  77 seeds    SHOULDER ARTHROSCOPY Left 2018   in [place]   "remove spur"   SPACE OAR INSTILLATION N/A 07/14/2018   Procedure: SPACE OAR INSTILLATION;  Surgeon: Ihor Gully, MD;  Location: [facility];  Service: Urology;  Laterality: N/A;   TEE WITHOUT CARDIOVERSION N/A 04/30/2019   Procedure: TRANSESOPHAGEAL ECHOCARDIOGRAM (TEE);  Surgeon: Delight Ovens, MD;  Location: [facility] OR;  Service: Open Heart Surgery;  Laterality: N/A;    Allergies: Allergies as of 09/14/2022 - Review Complete 01/19/2022  Allergen Reaction Noted   Ace inhibitors Other (See Comments) 08/01/2013    Medications: No outpatient medications have been marked as taking for the 09/14/22 encounter (Appointment) with Venetia Night, MD.    Social History: Social History   Tobacco Use   Smoking status: Never   Smokeless tobacco: Never  Vaping Use   Vaping status: Never Used  Substance Use Topics   Alcohol use: Not Currently   Drug use: Never    Family Medical History: Family History  Problem Relation Age of Onset   Stroke Mother    Diabetes Father    Hypertension Father    Diabetes Sister    Cancer Neg Hx     Physical Examination: There were no vitals filed for this visit.   General: Patient is well developed, well nourished, calm, collected, and in no apparent distress. Attention to examination is appropriate.  Neck:   Supple.  Full range of motion.  Respiratory: Patient is breathing without any difficulty.   NEUROLOGICAL:     Awake, alert, oriented to person, place, and time.  Speech is clear and fluent. Fund of knowledge is appropriate.   Cranial Nerves: Pupils equal round and reactive to light.  Facial tone is symmetric.   Facial sensation is symmetric. Shoulder shrug is symmetric. Tongue protrusion is midline.  There is no pronator drift.  ROM of spine: full.    Strength: Side Biceps Triceps Deltoid Interossei Grip Wrist Ext. Wrist Flex.  R 5 5 5 5 5 5 5   L 5 5 5 5 5 5 5    Side Iliopsoas Quads Hamstring PF DF EHL  R 5 5 5 5 5 5   L 5 5 5 5 5 5    Reflexes are 1+ and symmetric at the biceps, triceps, brachioradialis, patella and achilles.   Hoffman's is absent.  Clonus is not present.  Toes are down-going.  Bilateral upper and lower extremity sensation is intact to light touch.    No evidence of dysmetria noted.  Gait is abnormal and requires a cane.     Medical Decision Making  Imaging: MRI L spine 12/16/2020 IMPRESSION:  1. Lumbar spine spondylosis as described above most severe at L4-5.  2. At L4-5 there is a broad-based disc bulge with a broad central  disc protrusion. Mild bilateral  facet arthropathy with ligamentum  flavum infolding. Severe spinal stenosis. Severe bilateral foraminal  stenosis.  3. No acute osseous injury of the lumbar spine.   Electronically Signed    By: Elige Ko M.D.    On: 12/17/2020 10:08  MRI L spine 10/26/2021 IMPRESSION: 1. At L4-5 there is a broad-based disc osteophyte complex with a broad small central disc protrusion. Moderate bilateral facet arthropathy. Severe spinal stenosis. Severe left and moderate right foraminal stenosis. 2. At L5-S1 there is a broad-based disc osteophyte complex. Moderate right and mild left facet arthropathy. Prominence of the epidural fat deforming the thecal sac as can be seen with epidural lipomatosis. No spinal stenosis. Moderate bilateral foraminal stenosis. 3. Otherwise, lumbar spine spondylosis as described above. Overall, no significant interval change compared with the prior exam. 4.  No acute osseous injury of the lumbar spine.     Electronically Signed   By: Elige Ko M.D.   On: 10/26/2021 14:42  I have  personally reviewed the images and agree with the above interpretation.  Assessment and Plan: Cristian Martin is a pleasant 77 y.o. 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  He has severe stenosis at L4-5.  I think he is suffering from neurogenic claudication due to this.  He has tried and failed conservative management.  We discussed that he has some back pain from spondylosis, but also has symptoms of neurogenic claudication.  As such, I think it is reasonable to consider L4-5 decompression but I am not certain that it will improve his back pain.  I think it may help with his walking and functionality.  He would like to move forward with surgical intervention with L4-5 decompression.  I discussed the planned procedure at length with the patient, including the risks, benefits, alternatives, and indications. The risks discussed include but are not limited to bleeding, infection, need for reoperation, spinal fluid leak, stroke, vision loss, anesthetic complication, coma, paralysis, and even death. I also described in detail that improvement was not guaranteed.  The patient expressed understanding of these risks, and asked that we proceed with surgery. I described the surgery in layman's terms, and gave ample opportunity for questions, which were answered to the best of my ability.  I spent a total of 30 minutes in face-to-face and non-face-to-face activities related to this patient's care today.  Thank you for involving me in the care of this patient.      Jaxsyn Catalfamo K. Myer Haff MD, [facility] Neurosurgery

## 2022-09-14 ENCOUNTER — Encounter: Payer: Self-pay | Admitting: Neurosurgery

## 2022-09-14 ENCOUNTER — Ambulatory Visit (INDEPENDENT_AMBULATORY_CARE_PROVIDER_SITE_OTHER): Payer: No Typology Code available for payment source | Admitting: Neurosurgery

## 2022-09-14 VITALS — BP 100/60 | Ht 70.0 in | Wt 197.0 lb

## 2022-09-14 DIAGNOSIS — M5186 Other intervertebral disc disorders, lumbar region: Secondary | ICD-10-CM | POA: Diagnosis not present

## 2022-09-14 DIAGNOSIS — M48062 Spinal stenosis, lumbar region with neurogenic claudication: Secondary | ICD-10-CM

## 2022-09-14 NOTE — Addendum Note (Signed)
Addended by: Ernie Hew on: 09/14/2022 11:10 AM   Modules accepted: Orders

## 2022-09-14 NOTE — Patient Instructions (Signed)
Please see below for information in regards to your upcoming surgery:   Planned surgery: L4-5 decompression   Surgery date: 10/13/22 at [facility]: [address] [place], [place] [address]) - you will find out your arrival time the business day before your surgery.   Pre-op appointment at [facility] Pre-admit Testing: we will call you with a date/time for this. If you are scheduled for an in person appointment, Pre-admit Testing is located on the first floor of the [facility] building, [address], [address]. Please bring all prescriptions in the original prescription bottles to your appointment, even if you have reviewed medications by phone with a pharmacy representative. During this appointment, they will advise you which medications you can take the morning of surgery, and which medications you will need to hold for surgery. Pre-op labs may be done at your pre-op appointment. You are not required to fast for these labs. Should you need to change your pre-op appointment, please call Pre-admit testing at [phone].     Blood thinners:   Aspirin:  ok to stay on aspirin 81mg     Diabetes medications:  Semaglutide (Ozempic): hold for 7 days prior to surgery  Metformin: hold for 2 days prior to surgery     Surgical clearance: we will send a clearance form to Dr Newt Lukes at the [facility]     Common restrictions after surgery: No bending, lifting, or twisting ("BLT"). Avoid lifting objects heavier than 10 pounds for the first 6 weeks after surgery. Where possible, avoid household activities that involve lifting, bending, reaching, pushing, or pulling such as laundry, vacuuming, grocery shopping, and childcare. Try to arrange for help from friends and family for these activities while you heal. Do not drive while taking prescription pain medication. Weeks 6 through 12 after surgery: avoid lifting more than 25 pounds.      How to contact us:  If you have any questions/concerns before or after surgery, you can reach [place] at [phone], or you can send a mychart message. We can be reached by phone or mychart 8am-4pm, Monday-Friday.  *Please note: Calls after 4pm are forwarded to a third party answering service. Mychart messages are not routinely monitored during evenings, weekends, and holidays. Please call our office to contact the answering service for urgent concerns during non-business hours.     Appointments/FMLA & disability paperwork: Patty & Cristin  Nurse: Royston Cowper  Medical assistants: Laurann Montana, & Lyla Son Physician Assistants: Manning Charity & Drake Leach Surgeons: Venetia Night, MD & Ernestine Mcmurray, MD

## 2022-09-17 ENCOUNTER — Other Ambulatory Visit: Payer: Self-pay

## 2022-09-17 DIAGNOSIS — Z01818 Encounter for other preprocedural examination: Secondary | ICD-10-CM

## 2022-10-01 ENCOUNTER — Telehealth: Payer: Self-pay

## 2022-10-01 ENCOUNTER — Telehealth: Payer: Self-pay | Admitting: *Deleted

## 2022-10-01 ENCOUNTER — Encounter: Payer: Self-pay | Admitting: Neurosurgery

## 2022-10-01 ENCOUNTER — Encounter
Admission: RE | Admit: 2022-10-01 | Discharge: 2022-10-01 | Disposition: A | Discharge status: Home / Self Care | Payer: No Typology Code available for payment source | Source: Ambulatory Visit | Attending: Neurosurgery | Admitting: Neurosurgery

## 2022-10-01 ENCOUNTER — Other Ambulatory Visit: Payer: Self-pay

## 2022-10-01 VITALS — BP 110/68 | HR 78 | Temp 98.1°F | Ht 70.0 in | Wt 197.6 lb

## 2022-10-01 DIAGNOSIS — E782 Mixed hyperlipidemia: Secondary | ICD-10-CM | POA: Diagnosis not present

## 2022-10-01 DIAGNOSIS — E1121 Type 2 diabetes mellitus with diabetic nephropathy: Secondary | ICD-10-CM

## 2022-10-01 DIAGNOSIS — I10 Essential (primary) hypertension: Secondary | ICD-10-CM | POA: Diagnosis not present

## 2022-10-01 DIAGNOSIS — Z01818 Encounter for other preprocedural examination: Secondary | ICD-10-CM

## 2022-10-01 DIAGNOSIS — Z01812 Encounter for preprocedural laboratory examination: Secondary | ICD-10-CM

## 2022-10-01 HISTORY — DX: Unstable angina: I20.0

## 2022-10-01 HISTORY — DX: Personal history of colon polyps, unspecified: Z86.0100

## 2022-10-01 HISTORY — DX: Spondylosis without myelopathy or radiculopathy, lumbar region: M47.816

## 2022-10-01 HISTORY — DX: Type 2 diabetes mellitus with other diabetic kidney complication: E11.29

## 2022-10-01 HISTORY — DX: Spinal stenosis, lumbar region with neurogenic claudication: M48.062

## 2022-10-01 HISTORY — DX: Paresthesia of skin: R20.2

## 2022-10-01 HISTORY — DX: Other intervertebral disc degeneration, lumbar region without mention of lumbar back pain or lower extremity pain: M51.369

## 2022-10-01 HISTORY — DX: Other chronic pain: G89.29

## 2022-10-01 HISTORY — DX: Personal history of colonic polyps: Z86.010

## 2022-10-01 HISTORY — DX: Type 2 diabetes mellitus with diabetic neuropathy, unspecified: E11.40

## 2022-10-01 HISTORY — DX: Other intervertebral disc degeneration, lumbar region: M51.36

## 2022-10-01 HISTORY — DX: Iron deficiency anemia, unspecified: D50.9

## 2022-10-01 LAB — CBC
HCT: 44.9 % (ref 39.0–52.0)
Hemoglobin: 14.3 g/dL (ref 13.0–17.0)
MCH: 28.1 pg (ref 26.0–34.0)
MCHC: 31.8 g/dL (ref 30.0–36.0)
MCV: 88.2 fL (ref 80.0–100.0)
Platelets: 229 10*3/uL (ref 150–400)
RBC: 5.09 MIL/uL (ref 4.22–5.81)
RDW: 15 % (ref 11.5–15.5)
WBC: 7.9 10*3/uL (ref 4.0–10.5)
nRBC: 0 % (ref 0.0–0.2)

## 2022-10-01 LAB — TYPE AND SCREEN
ABO/RH(D): O POS
Antibody Screen: NEGATIVE

## 2022-10-01 LAB — URINALYSIS, COMPLETE (UACMP) WITH MICROSCOPIC
Bacteria, UA: NONE SEEN
Bilirubin Urine: NEGATIVE
Glucose, UA: >=500 mg/dL — AB
Hgb urine dipstick: NEGATIVE
Ketones, ur: NEGATIVE mg/dL
Leukocytes,Ua: NEGATIVE
Nitrite: NEGATIVE
Protein, ur: 100 mg/dL — AB
Specific Gravity, Urine: 1.024 (ref 1.005–1.030)
Squamous Epithelial / HPF: NONE SEEN /HPF (ref 0–5)
pH: 5 (ref 5.0–8.0)

## 2022-10-01 LAB — BASIC METABOLIC PANEL
Anion gap: 11 (ref 5–15)
BUN: 19 mg/dL (ref 8–23)
CO2: 24 mmol/L (ref 22–32)
Calcium: 9.3 mg/dL (ref 8.9–10.3)
Chloride: 106 mmol/L (ref 98–111)
Creatinine, Ser: 1.07 mg/dL (ref 0.61–1.24)
GFR, Estimated: >60 mL/min (ref >60)
Glucose, Bld: 87 mg/dL (ref 70–99)
Potassium: 4.3 mmol/L (ref 3.5–5.1)
Sodium: 141 mmol/L (ref 135–145)

## 2022-10-01 LAB — SURGICAL PCR SCREEN
MRSA, PCR: NEGATIVE
Staphylococcus aureus: NEGATIVE

## 2022-10-01 NOTE — Patient Instructions (Addendum)
Your procedure is scheduled on:  Wednesday September 11  Report to the Registration Desk on the 1st floor of the [facility] To find out your arrival time, please call [phone] between 1PM - 3PM on:  Tuesday September 10  If your arrival time is 6:00 am, do not arrive before that time as the [facility] entrance doors do not open until 6:00 am.  REMEMBER: Instructions that are not followed completely may result in serious medical risk, up to and including death; or upon the discretion of your surgeon and anesthesiologist your surgery may need to be rescheduled.  Do not eat food after midnight the night before surgery.  No gum chewing or hard candies.  You may however, drink WATER up to 2 hours before you are scheduled to arrive for your surgery. Do not drink anything within 2 hours of your scheduled arrival time.  One week prior to surgery: Stop Anti-inflammatories (NSAIDS) such as Advil, Aleve, Ibuprofen, Motrin, Naproxen, Naprosyn and Aspirin based products such as Excedrin, Goody's Powder, BC Powder.  Stop ANY OVER THE COUNTER supplements until after surgery. melatonin  Multiple Vitamins-Minerals  You may however, continue to take Tylenol if needed for pain up until the day of surgery.  Continue taking all prescribed medications with the exception of the following: losartan (COZAAR) hold the day of surgery metFORMIN (GLUCOPHAGE-XR) hold 2 days prior to surgery last dose Sunday September 8  Semaglutide [facility]) hold 7 days prior to surgery last dose Saturday September 3   Follow recommendations from Cardiologist or PCP regarding stopping blood thinners. Dr. Myer Haff said to continue your aspirin EC   TAKE ONLY THESE MEDICATIONS THE MORNING OF SURGERY WITH A SIP OF WATER:   isosorbide mononitrate (IMDUR)  DULoxetine (CYMBALTA)  omeprazole (PRILOSEC)  rosuvastatin (CRESTOR   No Alcohol for 24 hours before or after surgery.  No Smoking including e-cigarettes for 24  hours before surgery.  No chewable tobacco products for at least 6 hours before surgery.  No nicotine patches on the day of surgery.  Do not use any "recreational" drugs for at least a week (preferably 2 weeks) before your surgery.  Please be advised that the combination of cocaine and anesthesia may have negative outcomes, up to and including death. If you test positive for cocaine, your surgery will be cancelled.  On the morning of surgery brush your teeth with toothpaste and water, you may rinse your mouth with mouthwash if you wish. Do not swallow any toothpaste or mouthwash.  Use CHG Soap or wipes as directed on instruction sheet.  Do not wear jewelry, make-up, hairpins, clips or nail polish.  Do not wear lotions, powders, or perfumes.   Do not shave body hair from the neck down 48 hours before surgery.  Contact lenses, hearing aids and dentures may not be worn into surgery.  Do not bring valuables to the hospital. [facility] is not responsible for any missing/lost belongings or valuables.   Notify your doctor if there is any change in your medical condition (cold, fever, infection).  Wear comfortable clothing (specific to your surgery type) to the hospital.  After surgery, you can help prevent lung complications by doing breathing exercises.  Take deep breaths and cough every 1-2 hours.   If you are being admitted to the hospital overnight, leave your suitcase in the car. After surgery it may be brought to your room.  In case of increased patient census, it may be necessary for you, the patient, to continue  your postoperative care in the Same Day Surgery department.  If you are being discharged the day of surgery, you will not be allowed to drive home. You will need a responsible individual to drive you home and stay with you for 24 hours after surgery.   If you are taking public transportation, you will need to have a responsible individual with you.  Please call the  Pre-admissions Testing Dept. at [phone] if you have any questions about these instructions.  Surgery Visitation Policy:  Patients having surgery or a procedure may have two visitors.  Children under the age of 61 must have an adult with them who is not the patient.  Inpatient Visitation:    Visiting hours are 7 a.m. to 8 p.m. Up to four visitors are allowed at one time in a patient room. The visitors may rotate out with other people during the day.  One visitor age 67 or older may stay with the patient overnight and must be in the room by 8 p.m.         Pre-operative 5 CHG Bath Instructions   You can play a key role in reducing the risk of infection after surgery. Your skin needs to be as free of germs as possible. You can reduce the number of germs on your skin by washing with CHG (chlorhexidine gluconate) soap before surgery. CHG is an antiseptic soap that kills germs and continues to kill germs even after washing.   DO NOT use if you have an allergy to chlorhexidine/CHG or antibacterial soaps. If your skin becomes reddened or irritated, stop using the CHG and notify one of our RNs at [phone].   Please shower with the CHG soap starting 4 days before surgery using the following schedule:     Please keep in mind the following:  DO NOT shave, including legs and underarms, starting the day of your first shower.   You may shave your face at any point before/day of surgery.  Place clean sheets on your bed the day you start using CHG soap. Use a clean washcloth (not used since being washed) for each shower. DO NOT sleep with pets once you start using the CHG.   CHG Shower Instructions:  If you choose to wash your hair and private area, wash first with your normal shampoo/soap.  After you use shampoo/soap, rinse your hair and body thoroughly to remove shampoo/soap residue.  Turn the water OFF and apply about 3 tablespoons (45 ml) of CHG soap to a CLEAN washcloth.  Apply  CHG soap ONLY FROM YOUR NECK DOWN TO YOUR TOES (washing for 3-5 minutes)  DO NOT use CHG soap on face, private areas, open wounds, or sores.  Pay special attention to the area where your surgery is being performed.  If you are having back surgery, having someone wash your back for you may be helpful. Wait 2 minutes after CHG soap is applied, then you may rinse off the CHG soap.  Pat dry with a clean towel  Put on clean clothes/pajamas   If you choose to wear lotion, please use ONLY the CHG-compatible lotions on the back of this paper.     Additional instructions for the day of surgery: DO NOT APPLY any lotions, deodorants, cologne, or perfumes.   Put on clean/comfortable clothes.  Brush your teeth.  Ask your nurse before applying any prescription medications to the skin.      CHG Compatible Lotions   Aveeno Moisturizing lotion  Cetaphil Moisturizing  Cream  Cetaphil Moisturizing Lotion  Clairol Herbal Essence Moisturizing Lotion, Dry Skin  Clairol Herbal Essence Moisturizing Lotion, Extra Dry Skin  Clairol Herbal Essence Moisturizing Lotion, Normal Skin  Curel Age Defying Therapeutic Moisturizing Lotion with Alpha Hydroxy  Curel Extreme Care Body Lotion  Curel Soothing Hands Moisturizing Hand Lotion  Curel Therapeutic Moisturizing Cream, Fragrance-Free  Curel Therapeutic Moisturizing Lotion, Fragrance-Free  Curel Therapeutic Moisturizing Lotion, Original Formula  Eucerin Daily Replenishing Lotion  Eucerin Dry Skin Therapy Plus Alpha Hydroxy Crme  Eucerin Dry Skin Therapy Plus Alpha Hydroxy Lotion  Eucerin Original Crme  Eucerin Original Lotion  Eucerin Plus Crme Eucerin Plus Lotion  Eucerin TriLipid Replenishing Lotion  Keri Anti-Bacterial Hand Lotion  Keri Deep Conditioning Original Lotion Dry Skin Formula Softly Scented  Keri Deep Conditioning Original Lotion, Fragrance Free Sensitive Skin Formula  Keri Lotion Fast Absorbing Fragrance Free Sensitive Skin Formula  Keri  Lotion Fast Absorbing Softly Scented Dry Skin Formula  Keri Original Lotion  Keri Skin Renewal Lotion Keri Silky Smooth Lotion  Keri Silky Smooth Sensitive Skin Lotion  Nivea Body Creamy Conditioning Oil  Nivea Body Extra Enriched Teacher, adult education Moisturizing Lotion Nivea Crme  Nivea Skin Firming Lotion  NutraDerm 30 Skin Lotion  NutraDerm Skin Lotion  NutraDerm Therapeutic Skin Cream  NutraDerm Therapeutic Skin Lotion  ProShield Protective Hand Cream  Provon moisturizing lotion

## 2022-10-01 NOTE — Telephone Encounter (Signed)
I spoke with Tresa Endo (charge nurse), who reports he was last seen in February and that provider left the [place]. His next appointment is in December and is with Dr Vella Redhead. Tresa Endo will discuss with Dr Vella Redhead on Tuesday. Clearance form has been faxed to Cardiology at [facility]. [facility] Ph: [phone] x [phone]

## 2022-10-01 NOTE — Telephone Encounter (Signed)
   Name: Cristian Martin  DOB: 1945/03/18  MRN: [id number]  Primary Cardiologist: Lorine Bears, MD  Chart reviewed as part of pre-operative protocol coverage. Because of [name]'s past medical history and time since last visit, he will require a follow-up in-office visit in order to better assess preoperative cardiovascular risk. Pt was last seen in April 2021.   Pre-op covering staff: - Please schedule appointment and call patient to inform them. If patient already had an upcoming appointment within acceptable timeframe, please add "pre-op clearance" to the appointment notes so provider is aware. - Please contact requesting surgeon's office via preferred method (i.e, phone, fax) to inform them of need for appointment prior to surgery.  Joylene Grapes, NP  10/01/2022, 12:23 PM

## 2022-10-01 NOTE — Telephone Encounter (Signed)
Patient's wife returned call stating [facility] to, so an appt is not needed.

## 2022-10-01 NOTE — Telephone Encounter (Signed)
-----   Message from Verlee Monte sent at 10/01/2022 11:55 AM EDT ----- Regarding: Request for pre-operative cardiac clearance Request for pre-operative cardiac clearance:  1. What type of surgery is being performed?  L4-5 DECOMPRESSION   2. When is this surgery scheduled?  10/13/2022  3. Type of clearance being requested (medical, pharmacy, both)? MEDICAL   4. Are there any medications that need to be held prior to surgery? NONE - ok to continue ASA per surgeon  5. Practice name and name of physician performing surgery?  Performing surgeon: Dr. Venetia Night, MD Requesting clearance: Cristian Mulling, FNP-C    6. Anesthesia type (none, local, MAC, general)? GENERAL  7. What is the office phone and fax number?   Phone: [phone] Fax: [phone]  ATTENTION: Unable to create telephone message as per your standard workflow. Directed by [facility] providers to send requests for cardiac clearance to this pool for appropriate distribution to provider covering pre-operative clearances.   Cristian Mulling, MSN, APRN, FNP-C, CEN [facility]  Peri-operative Services Nurse Practitioner Phone: [phone] 10/01/22 11:55 AM

## 2022-10-01 NOTE — Telephone Encounter (Signed)
I called the pt to let him know that he is going to need a new pt appt as he was last seen 05/2019. I will send a message to [facility] scheduling team to see where they can get the pt in as new pt appt.   I will also update the surgeon's office as possibly may need to post pone surgery until the pt has been seen and cleared by cardiology.

## 2022-10-05 NOTE — Telephone Encounter (Signed)
I will update all parties involved to see message from pt's wife, pt has cardiologist with the [place].

## 2022-10-06 ENCOUNTER — Ambulatory Visit: Payer: No Typology Code available for payment source | Admitting: Cardiology

## 2022-10-06 NOTE — Telephone Encounter (Signed)
I confirmed with Cristian Martin that the clearance has been received and has been given to the cardiologist on 10/05/22

## 2022-10-06 NOTE — Telephone Encounter (Signed)
I spoke with Kandee Keen at the [place], who informed me they have a note dated 03/08/22 stating the patient is cleared for lumbar spine surgery. He is going to send me a copy of it. I asked that he still speak with his provider about an updated clearance in the meantime.  Received a note from the [facility] dated 03/08/22 stating the patient is cleared for lumbar spine surgery. I will send a copy of this to Quentin Mulling, NP for review to see if this is sufficient for surgery next week.

## 2022-10-07 NOTE — Telephone Encounter (Addendum)
I called Cardiology at the [facility] [phone] and spoke with [facility]. I notified him of Cristian Martin's response.  Also left message w/ update on spouse's personal voicemail that he was cleared in February and we will proceed as planned at this time.

## 2022-10-11 ENCOUNTER — Encounter: Payer: Self-pay | Admitting: Neurosurgery

## 2022-10-11 NOTE — Progress Notes (Signed)
Perioperative / Anesthesia Services  Pre-Admission Testing Clinical Review / Pre-Operative Anesthesia Consult  Date: 10/12/22  Patient Demographics:  Name: Cristian Martin DOB:   03/12/1945 MRN:   [id number]  Planned Surgical Procedure(s):    Case: [id number] Date/Time: 10/13/22 1310   Procedure: L4-5 DECOMPRESSION   Anesthesia type: General   Pre-op diagnosis: Neurogenic claudication due to lumbar spinal stenosis  M48.062   Location: [facility] OR ROOM 03 / [facility] ORS FOR ANEST[facility]IA [facility]   Surgeons: Venetia Night, MD     NOTE: Available PAT nursing documentation and vital signs have been reviewed. Clinical nursing staff has updated patient's PMH/PSHx, current medication list, and drug allergies/intolerances to ensure comprehensive history available to assist in medical decision making as it pertains to the aforementioned surgical procedure and anticipated anesthetic course. Extensive review of available clinical information personally performed. [place] [facility] and PSH[facility] updated with any diagnoses/procedures that  may have been inadvertently omitted during his intake with the pre-admission testing department's nursing staff.  Clinical Discussion:  Cristian Martin is a 77 y.o. male who is submitted for pre-surgical anesthesia review and clearance prior to him undergoing the above procedure. Patient has never been a smoker. 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  Patient is followed by cardiology Baron Hamper, MD). He was last seen in the cardiology clinic on 03/08/2022; notes reviewed. At the time of his clinic visit, patient doing well overall from a cardiovascular perspective.  Patient was experiencing difficulty  with ambulation due to his lower back pain with neurogenic claudication.  Patient had sustained several atraumatic mechanical falls.  patient denied any chest pain, shortness of breath, PND, orthopnea, palpitations, significant peripheral edema, weakness, fatigue, vertiginous symptoms, or presyncope/syncope. Patient with a past medical history significant for cardiovascular diagnoses. Documented physical exam was grossly benign, providing no evidence of acute exacerbation and/or decompensation of the patient's known cardiovascular conditions.  Of note, patient is managed at the [place].  Complete records regarding patient's cardiovascular history unavailable for review at time of consult.  Information gathered from patient report from available documentation.  Patient underwent diagnostic LEFT heart catheterization on 04/27/2019 revealing multivessel CAD; 99% distal LM to ostial LAD, 50% ostial to proximal LCx, 70% proximal RCA, 80% proximal to mid LAD, and 70% D2.  Given the degree and complexity of his coronary artery disease, patient was referred to CVTS for discussions regarding revascularization.  Patient underwent three-vessel revascularization procedure on 04/30/2019.  LIMA-LAD, SVG-OM1, and SVG-RCA bypass grafts were placed.  Most recent TTE was performed on 09/09/2021 revealing a normal left ventricular systolic function with an EF of >55%.  There were no regional wall motion abnormalities.  Right ventricular size and function normal.  There was trivial aortic valve sclerosis with trivial regurgitation.  All transvalvular gradients were noted to be normal providing no evidence suggestive of valvular stenosis.  Ascending aorta measured 3.8 cm.  Blood pressure 154/84 mmHg on currently prescribed beta-blocker (carvedilol), nitrate (isosorbide mononitrate), and ARB (losartan) therapies.  Patient is on rosuvastatin for his HLD diagnosis and ASCVD prevention. T2DM well controlled on currently prescribed  regimen; last HgbA1c was 6.8% when checked on 08/23/2022. He does not have an OSAH diagnosis. Functional capacity is limited by  patient's age, back pain causing movements issues, and his other various multiple medical  comorbidities.  With that said, patient is able to complete all of his ADL/IADLs without significant cardiovascular limitation. Per the [facility], patient is questionably able to achieve at least 4 METS of physical activity without experiencing, at least to some degree, significant angina/anginal equivalent symptoms. It sounds as if he is also limited due to deconditioning related to his back pain with neurogenic claudication. No changes were made to his medication regimen during his visit with cardiology.  Patient scheduled to follow-up with outpatient cardiology in 1 year or sooner if needed.  Cristian Martin is scheduled for an L4-5 DECOMPRESSION on 10/13/2022 with Dr. Venetia Night, MD.  Given patient's past medical history significant for cardiovascular diagnoses, presurgical cardiac clearance was sought by the PAT team.  Per cardiology, "patient is stable cardiac wise without any anginal symptoms and being scheduled for lumbosacral spine surgery.  Patient is optimally managed and cleared cardiac wise for the surgery".  In review of his medication reconciliation, it is noted that patient is currently on prescribed daily antithrombotic therapy.  Per directives received from patient's primary attending surgeon, patient may continue his daily low-dose ASA throughout his perioperative course.  Patient denies previous perioperative complications with anesthesia in the past. In review of the available records, it is noted that patient underwent a general anesthetic course here at [facility] in 08/2019 without documented complications.      10/01/2022   10:39 AM 09/14/2022    8:40 AM 01/19/2022    9:49 AM  Vitals with BMI  Height 5\' 10"  5\' 10"  5\' 11"    Weight 197 lbs 10 oz 197 lbs 204 lbs 6 oz  BMI 28.35 28.27 28.52  Systolic 110 100 621  Diastolic 68 60 80  Pulse 78      Providers/Specialists:   NOTE: Primary physician provider listed below. Patient may have been seen by APP or partner within same practice.   PROVIDER ROLE / SPECIALTY LAST Donalynn Furlong, MD Neurosurgery (Surgeon) 09/14/2022  [facility] Provider 05/12/2022  Orma Flaming, MD Cardiology 03/08/2022   Allergies:  Ace inhibitors  Current Home Medications:   No current facility-administered medications for this encounter.    aspirin EC 81 MG tablet   carvedilol (COREG) 25 MG tablet   diclofenac Sodium (VOLTAREN) 1 % GEL   DULoxetine (CYMBALTA) 60 MG capsule   gabapentin (NEURONTIN) 600 MG tablet   insulin aspart protamine - aspart (NOVOLOG MIX 70/30 FLEXPEN) (70-30) 100 UNIT/ML FlexPen   isosorbide mononitrate (IMDUR) 30 MG 24 hr tablet   losartan (COZAAR) 100 MG tablet   melatonin 3 MG TABS tablet   metFORMIN (GLUCOPHAGE-XR) 500 MG 24 hr tablet   Multiple Vitamins-Minerals (MULTIVITAMIN ADULT PO)   omeprazole (PRILOSEC) 20 MG capsule   rosuvastatin (CRESTOR) 40 MG tablet   Semaglutide (OZEMPIC, 1 MG/DOSE, )   tamsulosin (FLOMAX) 0.4 MG CAPS capsule   History:   Past Medical History:  Diagnosis Date   Agent orange exposure    Anxiety    Ascending aorta dilatation (HCC) 2022   a.) TTE 2022: Ao root 41 mm (per cardiology notes); b.) TTE 09/09/2021: asc Ao 38 mm   Chronic painful diabetic neuropathy (HCC)    Coronary artery disease    a.) LHC 04/27/2019: 99% dLM-oLAD, 50% o-pLCx, 70% pRCA, 80% p-mLAD, 70% D2 --> CVTS referral; b.) s/p 3v CABG 04/30/2019   DDD (degenerative disc disease), cervical    Diastolic dysfunction    a.)  TTE 04/27/2019: EF 55-60%, mod conc LVH, G1DD. Nl RV fxn. Mild Ao sclerosis w/o stenosis   Erectile dysfunction    a.) on intracavernous alprostadil injections   GERD (gastroesophageal reflux  disease)    HLD (hyperlipidemia)    Hx of colonic polyps    Hyperlipidemia    Hyperplasia of prostate with lower urinary tract symptoms (LUTS)    Hypertension    Insomnia    a.) takes melatonin PRN   Iron deficiency anemia    Long term current use of aspirin    Lumbar degenerative disc disease    Lumbar facet arthropathy    Osteoarthritis    Paresthesia of both feet    Prostate cancer (HCC) 02/21/2018   a.) stage T1c; Gleason 3+4; b.) s/p I-125 brachytherapy   Recurrent falls    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   a.) LIMA-LAD, SVG-OM1, SVG-RCA   Spinal stenosis, lumbar region with neurogenic claudication    Type 2 diabetes mellitus treated with insulin (HCC)    Unstable angina (HCC)    Wears glasses    "wears to help double vision"   Wears partial dentures (upper)    Past Surgical History:  Procedure Laterality Date   COLONOSCOPY WITH PROPOFOL N/A 08/16/2019   Procedure: COLONOSCOPY WITH PROPOFOL;  Surgeon: Toney Reil, MD;  Location: [facility] ENDOSCOPY;  Service: Gastroenterology;  Laterality: N/A;   CORONARY ARTERY BYPASS GRAFT N/A 04/30/2019   Procedure: CORONARY ARTERY BYPASS GRAFTING (CABG) TIMES THREE USING LEFT INTERNAL MAMMARY AND RIGHT GREATER SAPHENOUS VEIN HARVESTED ENDOSCOPICALLY. MAMMARY ARTERY TO LAD, SAPHENOUS VEIN GRAFT TO  OM1, SAPHENOUS VEING GRAFT TO RIGHT. BIOPSY OF MEDIASTINAL TISSUE PLACEMENT OF RIGHT FEMORAL A-LINE;  Surgeon: Delight Ovens, MD;  Location: [facility] OR;  Service: Open Heart Surgery;  Laterality: N/A;   CYSTOSCOPY N/A 07/14/2018   Procedure: CYSTOSCOPY;  Surgeon: Ihor Gully, MD;  Location: [facility];  Service: Urology;  Laterality: N/A;  no seeds in bladder per Dr Vernie Ammons   ESOPHAGOGASTRODUODENOSCOPY (EGD) WITH PROPOFOL N/A 08/16/2019   Procedure: ESOPHAGOGASTRODUODENOSCOPY (EGD) WITH PROPOFOL;  Surgeon: Toney Reil, MD;  Location: [facility] ENDOSCOPY;  Service: Gastroenterology;  Laterality: N/A;   LEFT HEART CATH AND CORONARY  ANGIOGRAPHY N/A 04/27/2019   Procedure: LEFT HEART CATH AND CORONARY ANGIOGRAPHY;  Surgeon: Iran Ouch, MD;  [facility] INVASIVE CV LAB;  Service: Cardiovascular;  Laterality: N/A;   PROSTATE BIOPSY  02-21-2018  dr Vernie Ammons in office   RADIOACTIVE SEED IMPLANT N/A 07/14/2018   Procedure: RADIOACTIVE SEED IMPLANT/BRACHYTHERAPY IMPLANT;  Surgeon: Ihor Gully, MD;  Location: [facility];  Service: Urology;  Laterality: N/A;  77 seeds    SHOULDER ARTHROSCOPY Left 2018   in [place]   "remove spur"   SPACE OAR INSTILLATION N/A 07/14/2018   Procedure: SPACE OAR INSTILLATION;  Surgeon: Ihor Gully, MD;  Location: [facility];  Service: Urology;  Laterality: N/A;   TEE WITHOUT CARDIOVERSION N/A 04/30/2019   Procedure: TRANSESOPHAGEAL ECHOCARDIOGRAM (TEE);  Surgeon: Delight Ovens, MD;  Location: [facility] OR;  Service: Open Heart Surgery;  Laterality: N/A;   Family History  Problem Relation Age of Onset   Stroke Mother    Diabetes Father    Hypertension Father    Diabetes Sister    Cancer Neg Hx    Social History   Tobacco Use   Smoking status: Never   Smokeless tobacco: Never  Vaping Use   Vaping status: Never Used  Substance Use Topics  Alcohol use: Not Currently   Drug use: Never    Pertinent Clinical Results:  LABS:   No visits with results within 3 Day(s) from this visit.  Latest known visit with results is:  Hospital Outpatient Visit on 10/01/2022  Component Date Value Ref Range Status   WBC 10/01/2022 7.9  4.0 - 10.5 K/uL Final   RBC 10/01/2022 5.09  4.22 - 5.81 MIL/uL Final   Hemoglobin 10/01/2022 14.3  13.0 - 17.0 g/dL Final   HCT 66/44/0347 44.9  39.0 - 52.0 % Final   MCV 10/01/2022 88.2  80.0 - 100.0 fL Final   MCH 10/01/2022 28.1  26.0 - 34.0 pg Final   MCHC 10/01/2022 31.8  30.0 - 36.0 g/dL Final   RDW 42/59/5638 15.0  11.5 - 15.5 % Final   Platelets 10/01/2022 229  150 - 400 K/uL Final   nRBC 10/01/2022 0.0  0.0 - 0.2 % Final    Performed at [facility], [address] [place], [place] [address]   Sodium 10/01/2022 141  135 - 145 mmol/L Final   Potassium 10/01/2022 4.3  3.5 - 5.1 mmol/L Final   Chloride 10/01/2022 106  98 - 111 mmol/L Final   CO2 10/01/2022 24  22 - 32 mmol/L Final   Glucose, Bld 10/01/2022 87  70 - 99 mg/dL Final   Glucose reference range applies only to samples taken after fasting for at least 8 hours.   BUN 10/01/2022 19  8 - 23 mg/dL Final   Creatinine, Ser 10/01/2022 1.07  0.61 - 1.24 mg/dL Final   Calcium 32/95/1884 9.3  8.9 - 10.3 mg/dL Final   GFR, Estimated 10/01/2022 >60  >60 mL/min Final   Comment: (NOTE) Calculated using the CKD-EPI Creatinine Equation (2021)    Anion gap 10/01/2022 11  5 - 15 Final   Performed at [facility], [address] [place], [place] [address]   ABO/RH(D) 10/01/2022 O POS   Final   Antibody Screen 10/01/2022 NEG   Final   Sample Expiration 10/01/2022 10/15/2022,2359   Final   Extend sample reason 10/01/2022    Final                   Value:NO TRANSFUSIONS OR PREGNANCY IN THE PAST 3 MONTHS Performed at [facility], [address] [place], [place] [address]    Color, Urine 10/01/2022 YELLOW (A)  YELLOW Final   APPearance 10/01/2022 CLEAR (A)  CLEAR Final   Specific Gravity, Urine 10/01/2022 1.024  1.005 - 1.030 Final   pH 10/01/2022 5.0  5.0 - 8.0 Final   Glucose, UA 10/01/2022 >=500 (A)  NEGATIVE mg/dL Final   Hgb urine dipstick 10/01/2022 NEGATIVE  NEGATIVE Final   Bilirubin Urine 10/01/2022 NEGATIVE  NEGATIVE Final   Ketones, ur 10/01/2022 NEGATIVE  NEGATIVE mg/dL Final   Protein, ur 10/93/2355 100 (A)  NEGATIVE mg/dL Final   Nitrite 73/22/0254 NEGATIVE  NEGATIVE Final   Leukocytes,Ua 10/01/2022 NEGATIVE  NEGATIVE Final   RBC / HPF 10/01/2022 0-5  0 - 5 RBC/hpf Final   WBC, UA 10/01/2022 0-5  0 - 5 WBC/hpf Final   Bacteria, UA 10/01/2022 NONE SEEN  NONE SEEN Final   Squamous Epithelial / HPF 10/01/2022 NONE SEEN   0 - 5 /HPF Final   Performed at [facility], [address] [place], [place] [address]   MRSA, PCR 10/01/2022 NEGATIVE  NEGATIVE Final   Staphylococcus aureus 10/01/2022 NEGATIVE  NEGATIVE Final   Comment: (  NOTE) The Xpert SA Assay (FDA approved for NASAL specimens in patients 49 years of age and older), is one component of a comprehensive surveillance program. It is not intended to diagnose infection nor to guide or monitor treatment. Performed at [facility], [address] [place], [place] [address]     ECG: Date: 10/01/2022 Time ECG obtained: 1032 AM Rate: 81 bpm Rhythm: normal sinus Axis (leads I and aVF): Normal Intervals: PR 208 ms. QRS 90 ms. QTc 415 ms. ST segment and T wave changes: No evidence of acute ST segment elevation or depression.   Comparison: Similar to previous tracing obtained on 07/27/2019    IMAGING / PROCEDURES: MR LUMBAR SPINE WO CONTRAST performed on 10/23/2021 At L4-5 there is a broad-based disc osteophyte complex with a broad small central disc protrusion. Moderate bilateral facet arthropathy. Severe spinal stenosis. Severe left and moderate right foraminal stenosis. At L5-S1 there is a broad-based disc osteophyte complex. Moderate right and mild left facet arthropathy. Prominence of the epidural fat deforming the thecal sac as can be seen with epidural lipomatosis. No spinal stenosis. Moderate bilateral foraminal stenosis. Otherwise, lumbar spine spondylosis as described above. Overall, no significant interval change compared with the prior exam. No acute osseous injury of the lumbar spine.  TRANSTHORACIC ECHOCARDIOGRAM performed on 09/09/2021 Normal left ventricular systolic function with an EF of >55% Mild concentric LVH No regional wall motion abnormalities Right ventricular size and function normal Trivial aortic valve sclerosis with no evidence of stenosis Trivial aortic valve regurgitation RVSP normal Ascending aorta  ectatic at 3.8 cm Normal gradients; no valvular stenosis No pericardial effusion  CT CERVICAL SPINE WO CONTRAST performed on 06/02/2021 No acute cervical spine fracture. Multilevel spondylosis and facet hypertrophy greatest at C4-5 and C6-7.  CORONARY ARTERY BYPASS GRAFTING performed on 04/30/2019 Three-vessel CABG procedure LIMA-LAD SVG-OM1 SVG-PDA  LEFT HEART CATHETERIZATION AND CORONARY ANGIOGRAPHY performed on 04/27/2019 Normal left ventricular systolic function with an EF of 55-65% LVEDP = 18 mmHg Multivessel CAD 99% distal LM-ostial LAD 50% ostial-proximal LCx 70% proximal RCA 80% proximal-mid LAD 70% D2 Recommendations Refer to CVTS for further evaluation regarding revascularization    Impression and Plan:  Cristian Martin has been referred for pre-anesthesia review and clearance prior to him undergoing the planned anesthetic and procedural courses. Available labs, pertinent testing, and imaging results were personally reviewed by me in preparation for upcoming operative/procedural course. [facility] medical record has been updated following extensive record review and patient interview with PAT staff.   This patient has been appropriately cleared by cardiology with an overall ACCEPTABLE risk of experiencing significant perioperative cardiovascular complications. Based on clinical review performed today (10/12/22), barring any significant acute changes in the patient's overall condition, it is anticipated that he will be able to proceed with the planned surgical intervention. Any acute changes in clinical condition may necessitate his procedure being postponed and/or cancelled. Patient will meet with anesthesia team (MD and/or CRNA) on the day of his procedure for preoperative evaluation/assessment. Questions regarding anesthetic course will be fielded at that time.   Pre-surgical instructions were reviewed with the patient during his PAT appointment, and questions were  fielded to satisfaction by PAT clinical staff. He has been instructed on which medications that he will need to hold prior to surgery, as well as the ones that have been deemed safe/appropriate to take on the day of his procedure. As part of the general education provided by PAT, patient made aware both verbally and in writing, that  he would need to abstain from the use of any illegal substances during his perioperative course.  He was advised that failure to follow the provided instructions could necessitate case cancellation or result in serious perioperative complications up to and including death. Patient encouraged to contact PAT and/or his surgeon's office to discuss any questions or concerns that may arise prior to surgery; verbalized understanding.   Quentin Mulling, MSN, APRN, FNP-C, CEN [facility]  Peri-operative Services Nurse Practitioner Phone: [phone] Fax: [phone] 10/12/22 8:56 AM  NOTE: This note has been prepared using Dragon dictation software. Despite my best ability to proofread, there is always the potential that unintentional transcriptional errors may still occur from this process.

## 2022-10-12 MED ORDER — ORAL CARE MOUTH RINSE: 15.0000 mL | Freq: Once | OROMUCOSAL | Status: AC

## 2022-10-12 MED ORDER — CHLORHEXIDINE GLUCONATE 0.12 % MT SOLN
15.0000 mL | Freq: Once | OROMUCOSAL | Status: AC
  Administered 2022-10-13: 15 mL via OROMUCOSAL

## 2022-10-12 MED ORDER — CEFAZOLIN IN SODIUM CHLORIDE 2-0.9 GM/100ML-% IV SOLN
2.0000 g | Freq: Once | INTRAVENOUS | Status: DC
  Filled 2022-10-12: qty 100

## 2022-10-12 MED ORDER — SODIUM CHLORIDE 0.9 % IV SOLN: INTRAVENOUS | Status: DC

## 2022-10-13 ENCOUNTER — Other Ambulatory Visit: Payer: Self-pay

## 2022-10-13 ENCOUNTER — Ambulatory Visit: Payer: No Typology Code available for payment source | Admitting: Urgent Care

## 2022-10-13 ENCOUNTER — Observation Stay
Admission: RE | Admit: 2022-10-13 | Discharge: 2022-10-14 | Disposition: A | Discharge status: Home / Self Care | Payer: No Typology Code available for payment source | Source: Ambulatory Visit | Attending: Neurosurgery | Admitting: Neurosurgery

## 2022-10-13 ENCOUNTER — Encounter: Payer: Self-pay | Admitting: Neurosurgery

## 2022-10-13 ENCOUNTER — Encounter
Admission: RE | Disposition: A | Discharge status: Home / Self Care | Payer: Self-pay | Source: Ambulatory Visit | Attending: Neurosurgery

## 2022-10-13 ENCOUNTER — Ambulatory Visit: Payer: No Typology Code available for payment source

## 2022-10-13 DIAGNOSIS — I10 Essential (primary) hypertension: Secondary | ICD-10-CM | POA: Diagnosis not present

## 2022-10-13 DIAGNOSIS — Z951 Presence of aortocoronary bypass graft: Secondary | ICD-10-CM | POA: Diagnosis not present

## 2022-10-13 DIAGNOSIS — M48062 Spinal stenosis, lumbar region with neurogenic claudication: Secondary | ICD-10-CM | POA: Diagnosis present

## 2022-10-13 DIAGNOSIS — E782 Mixed hyperlipidemia: Secondary | ICD-10-CM

## 2022-10-13 DIAGNOSIS — I2510 Atherosclerotic heart disease of native coronary artery without angina pectoris: Secondary | ICD-10-CM | POA: Diagnosis not present

## 2022-10-13 DIAGNOSIS — Z01818 Encounter for other preprocedural examination: Secondary | ICD-10-CM

## 2022-10-13 DIAGNOSIS — E119 Type 2 diabetes mellitus without complications: Secondary | ICD-10-CM | POA: Insufficient documentation

## 2022-10-13 DIAGNOSIS — E1121 Type 2 diabetes mellitus with diabetic nephropathy: Secondary | ICD-10-CM

## 2022-10-13 DIAGNOSIS — Z8546 Personal history of malignant neoplasm of prostate: Secondary | ICD-10-CM | POA: Insufficient documentation

## 2022-10-13 DIAGNOSIS — M5416 Radiculopathy, lumbar region: Principal | ICD-10-CM | POA: Diagnosis present

## 2022-10-13 HISTORY — DX: Unspecified osteoarthritis, unspecified site: M19.90

## 2022-10-13 HISTORY — DX: Insomnia, unspecified: G47.00

## 2022-10-13 HISTORY — DX: Hyperlipidemia, unspecified: E78.5

## 2022-10-13 HISTORY — DX: Presence of dental prosthetic device (complete) (partial): Z97.2

## 2022-10-13 HISTORY — DX: Anxiety disorder, unspecified: F41.9

## 2022-10-13 HISTORY — DX: Repeated falls: R29.6

## 2022-10-13 HISTORY — DX: Long term (current) use of aspirin: Z79.82

## 2022-10-13 HISTORY — DX: Male erectile dysfunction, unspecified: N52.9

## 2022-10-13 HISTORY — PX: LUMBAR LAMINECTOMY/DECOMPRESSION MICRODISCECTOMY: SHX5026

## 2022-10-13 HISTORY — DX: Other cervical disc degeneration, unspecified cervical region: M50.30

## 2022-10-13 LAB — GLUCOSE, CAPILLARY
Glucose-Capillary: 153 mg/dL — ABNORMAL HIGH (ref 70–99)
Glucose-Capillary: 147 mg/dL — ABNORMAL HIGH (ref 70–99)
Glucose-Capillary: 219 mg/dL — ABNORMAL HIGH (ref 70–99)
Glucose-Capillary: 270 mg/dL — ABNORMAL HIGH (ref 70–99)

## 2022-10-13 SURGERY — LUMBAR LAMINECTOMY/DECOMPRESSION MICRODISCECTOMY 1 LEVEL
Anesthesia: General | Site: Back

## 2022-10-13 MED ORDER — MENTHOL 3 MG MT LOZG: 1.0000 | LOZENGE | OROMUCOSAL | Status: DC | PRN

## 2022-10-13 MED ORDER — OXYCODONE HCL 5 MG/5ML PO SOLN: 5.0000 mg | Freq: Once | ORAL | Status: DC | PRN

## 2022-10-13 MED ORDER — ROSUVASTATIN CALCIUM 20 MG PO TABS
ORAL_TABLET | ORAL | Status: AC
  Filled 2022-10-13: qty 1

## 2022-10-13 MED ORDER — DEXAMETHASONE SODIUM PHOSPHATE 10 MG/ML IJ SOLN
INTRAMUSCULAR | Status: DC | PRN
  Administered 2022-10-13: 10 mg via INTRAVENOUS

## 2022-10-13 MED ORDER — OXYCODONE HCL 5 MG PO TABS: 5.0000 mg | ORAL_TABLET | Freq: Once | ORAL | Status: DC | PRN

## 2022-10-13 MED ORDER — SENNA 8.6 MG PO TABS
ORAL_TABLET | ORAL | Status: AC
  Filled 2022-10-13: qty 1

## 2022-10-13 MED ORDER — SUCCINYLCHOLINE CHLORIDE 200 MG/10ML IV SOSY
PREFILLED_SYRINGE | INTRAVENOUS | Status: AC
  Filled 2022-10-13: qty 10

## 2022-10-13 MED ORDER — ACETAMINOPHEN 650 MG RE SUPP: 650.0000 mg | RECTAL | Status: DC | PRN

## 2022-10-13 MED ORDER — LIDOCAINE HCL (PF) 2 % IJ SOLN
INTRAMUSCULAR | Status: AC
  Filled 2022-10-13: qty 5

## 2022-10-13 MED ORDER — DOCUSATE SODIUM 100 MG PO CAPS
100.0000 mg | ORAL_CAPSULE | Freq: Two times a day (BID) | ORAL | Status: DC
  Administered 2022-10-13 – 2022-10-14 (×3): 100 mg via ORAL

## 2022-10-13 MED ORDER — PHENYLEPHRINE HCL-NACL 20-0.9 MG/250ML-% IV SOLN
INTRAVENOUS | Status: DC | PRN
  Administered 2022-10-13: 40 ug/min via INTRAVENOUS

## 2022-10-13 MED ORDER — ACETAMINOPHEN 10 MG/ML IV SOLN
INTRAVENOUS | Status: AC
  Filled 2022-10-13: qty 100

## 2022-10-13 MED ORDER — GABAPENTIN 300 MG PO CAPS
600.0000 mg | ORAL_CAPSULE | Freq: Every day | ORAL | Status: DC
  Administered 2022-10-13: 600 mg via ORAL

## 2022-10-13 MED ORDER — TAMSULOSIN HCL 0.4 MG PO CAPS
ORAL_CAPSULE | ORAL | Status: AC
  Filled 2022-10-13: qty 1

## 2022-10-13 MED ORDER — PROPOFOL 10 MG/ML IV BOLUS
INTRAVENOUS | Status: AC
  Filled 2022-10-13: qty 20

## 2022-10-13 MED ORDER — METFORMIN HCL ER 500 MG PO TB24
ORAL_TABLET | ORAL | Status: AC
  Filled 2022-10-13: qty 2

## 2022-10-13 MED ORDER — BUPIVACAINE LIPOSOME 1.3 % IJ SUSP
INTRAMUSCULAR | Status: AC
  Filled 2022-10-13: qty 20

## 2022-10-13 MED ORDER — DULOXETINE HCL 60 MG PO CPEP
60.0000 mg | ORAL_CAPSULE | Freq: Every day | ORAL | Status: DC
  Administered 2022-10-13 – 2022-10-14 (×2): 60 mg via ORAL
  Filled 2022-10-13 (×2): qty 1

## 2022-10-13 MED ORDER — PROPOFOL 10 MG/ML IV BOLUS
INTRAVENOUS | Status: DC | PRN
  Administered 2022-10-13: 90 mg via INTRAVENOUS

## 2022-10-13 MED ORDER — FENTANYL CITRATE (PF) 100 MCG/2ML IJ SOLN
INTRAMUSCULAR | Status: AC
  Filled 2022-10-13: qty 2

## 2022-10-13 MED ORDER — SODIUM CHLORIDE FLUSH 0.9 % IV SOLN
INTRAVENOUS | Status: AC
  Filled 2022-10-13: qty 20

## 2022-10-13 MED ORDER — BUPIVACAINE HCL (PF) 0.5 % IJ SOLN
INTRAMUSCULAR | Status: AC
  Filled 2022-10-13: qty 60

## 2022-10-13 MED ORDER — OXYCODONE HCL 5 MG PO TABS: 5.0000 mg | ORAL_TABLET | ORAL | Status: DC | PRN

## 2022-10-13 MED ORDER — ONDANSETRON HCL 4 MG/2ML IJ SOLN
INTRAMUSCULAR | Status: DC | PRN
  Administered 2022-10-13: 4 mg via INTRAVENOUS

## 2022-10-13 MED ORDER — MORPHINE SULFATE (PF) 2 MG/ML IV SOLN: 1.0000 mg | INTRAVENOUS | Status: AC | PRN

## 2022-10-13 MED ORDER — SODIUM CHLORIDE (PF) 0.9 % IJ SOLN
INTRAMUSCULAR | Status: DC | PRN
  Administered 2022-10-13: 60 mL

## 2022-10-13 MED ORDER — CEFAZOLIN SODIUM-DEXTROSE 2-4 GM/100ML-% IV SOLN
INTRAVENOUS | Status: AC
  Filled 2022-10-13: qty 100

## 2022-10-13 MED ORDER — CARVEDILOL 12.5 MG PO TABS
ORAL_TABLET | ORAL | Status: AC
  Filled 2022-10-13: qty 2

## 2022-10-13 MED ORDER — LOSARTAN POTASSIUM 50 MG PO TABS
ORAL_TABLET | ORAL | Status: AC
  Filled 2022-10-13: qty 2

## 2022-10-13 MED ORDER — CHLORHEXIDINE GLUCONATE 0.12 % MT SOLN
OROMUCOSAL | Status: AC
  Filled 2022-10-13: qty 15

## 2022-10-13 MED ORDER — DOCUSATE SODIUM 100 MG PO CAPS
ORAL_CAPSULE | ORAL | Status: AC
  Filled 2022-10-13: qty 1

## 2022-10-13 MED ORDER — ENOXAPARIN SODIUM 40 MG/0.4ML IJ SOSY
40.0000 mg | PREFILLED_SYRINGE | INTRAMUSCULAR | Status: DC
  Administered 2022-10-14: 40 mg via SUBCUTANEOUS

## 2022-10-13 MED ORDER — GABAPENTIN 300 MG PO CAPS
ORAL_CAPSULE | ORAL | Status: AC
  Filled 2022-10-13: qty 2

## 2022-10-13 MED ORDER — ISOSORBIDE MONONITRATE ER 30 MG PO TB24
ORAL_TABLET | ORAL | Status: AC
  Filled 2022-10-13: qty 1

## 2022-10-13 MED ORDER — METFORMIN HCL ER 500 MG PO TB24
1000.0000 mg | ORAL_TABLET | Freq: Two times a day (BID) | ORAL | Status: DC
  Administered 2022-10-13 – 2022-10-14 (×2): 1000 mg via ORAL

## 2022-10-13 MED ORDER — SODIUM CHLORIDE 0.9% FLUSH
3.0000 mL | Freq: Two times a day (BID) | INTRAVENOUS | Status: DC
  Administered 2022-10-13 – 2022-10-14 (×2): 3 mL via INTRAVENOUS

## 2022-10-13 MED ORDER — METHOCARBAMOL 500 MG PO TABS: 500.0000 mg | ORAL_TABLET | Freq: Four times a day (QID) | ORAL | Status: DC | PRN

## 2022-10-13 MED ORDER — MAGNESIUM CITRATE PO SOLN: 1.0000 | Freq: Once | ORAL | Status: DC | PRN

## 2022-10-13 MED ORDER — POLYETHYLENE GLYCOL 3350 17 G PO PACK: 17.0000 g | PACK | Freq: Every day | ORAL | Status: DC | PRN

## 2022-10-13 MED ORDER — PHENYLEPHRINE HCL (PRESSORS) 10 MG/ML IV SOLN
INTRAVENOUS | Status: AC
  Filled 2022-10-13: qty 1

## 2022-10-13 MED ORDER — DEXMEDETOMIDINE HCL IN NACL 80 MCG/20ML IV SOLN
INTRAVENOUS | Status: DC | PRN
Start: 2022-10-13 — End: 2022-10-13
  Administered 2022-10-13: 8 ug via INTRAVENOUS

## 2022-10-13 MED ORDER — FENTANYL CITRATE (PF) 100 MCG/2ML IJ SOLN: 25.0000 ug | INTRAMUSCULAR | Status: DC | PRN

## 2022-10-13 MED ORDER — SUCCINYLCHOLINE CHLORIDE 200 MG/10ML IV SOSY
PREFILLED_SYRINGE | INTRAVENOUS | Status: DC | PRN
  Administered 2022-10-13: 120 mg via INTRAVENOUS

## 2022-10-13 MED ORDER — SODIUM CHLORIDE 0.9 % IV SOLN: INTRAVENOUS | Status: DC

## 2022-10-13 MED ORDER — 0.9 % SODIUM CHLORIDE (POUR BTL) OPTIME
TOPICAL | Status: DC | PRN
  Administered 2022-10-13: 1000 mL

## 2022-10-13 MED ORDER — ONDANSETRON HCL 4 MG/2ML IJ SOLN: 4.0000 mg | Freq: Four times a day (QID) | INTRAMUSCULAR | Status: DC | PRN

## 2022-10-13 MED ORDER — FENTANYL CITRATE (PF) 100 MCG/2ML IJ SOLN
INTRAMUSCULAR | Status: DC | PRN
  Administered 2022-10-13 (×2): 50 ug via INTRAVENOUS

## 2022-10-13 MED ORDER — SURGIFLO WITH THROMBIN (HEMOSTATIC MATRIX KIT) OPTIME
TOPICAL | Status: DC | PRN
  Administered 2022-10-13: 1 via TOPICAL

## 2022-10-13 MED ORDER — METHOCARBAMOL 1000 MG/10ML IJ SOLN
500.0000 mg | Freq: Four times a day (QID) | INTRAVENOUS | Status: DC | PRN
  Administered 2022-10-13: 500 mg via INTRAVENOUS
  Filled 2022-10-13: qty 500

## 2022-10-13 MED ORDER — OXYCODONE HCL 5 MG PO TABS: 10.0000 mg | ORAL_TABLET | ORAL | Status: DC | PRN

## 2022-10-13 MED ORDER — LOSARTAN POTASSIUM 50 MG PO TABS
100.0000 mg | ORAL_TABLET | Freq: Every day | ORAL | Status: DC
  Administered 2022-10-13 – 2022-10-14 (×2): 100 mg via ORAL

## 2022-10-13 MED ORDER — LIDOCAINE HCL (CARDIAC) PF 100 MG/5ML IV SOSY
PREFILLED_SYRINGE | INTRAVENOUS | Status: DC | PRN
  Administered 2022-10-13: 100 mg via INTRAVENOUS

## 2022-10-13 MED ORDER — METHYLPREDNISOLONE ACETATE 40 MG/ML IJ SUSP
INTRAMUSCULAR | Status: AC
  Filled 2022-10-13: qty 1

## 2022-10-13 MED ORDER — KETOROLAC TROMETHAMINE 15 MG/ML IJ SOLN
INTRAMUSCULAR | Status: AC
  Filled 2022-10-13: qty 1

## 2022-10-13 MED ORDER — PHENOL 1.4 % MT LIQD: 1.0000 | OROMUCOSAL | Status: DC | PRN

## 2022-10-13 MED ORDER — EPINEPHRINE PF 1 MG/ML IJ SOLN
INTRAMUSCULAR | Status: AC
  Filled 2022-10-13: qty 1

## 2022-10-13 MED ORDER — METHYLPREDNISOLONE ACETATE 40 MG/ML IJ SUSP
INTRAMUSCULAR | Status: DC | PRN
  Administered 2022-10-13: 40 mg

## 2022-10-13 MED ORDER — BISACODYL 10 MG RE SUPP: 10.0000 mg | Freq: Every day | RECTAL | Status: DC | PRN

## 2022-10-13 MED ORDER — INSULIN ASPART PROT & ASPART (70-30 MIX) 100 UNIT/ML ~~LOC~~ SUSP
60.0000 [IU] | Freq: Two times a day (BID) | SUBCUTANEOUS | Status: DC
  Administered 2022-10-13 – 2022-10-14 (×2): 60 [IU] via SUBCUTANEOUS
  Filled 2022-10-13: qty 10

## 2022-10-13 MED ORDER — ISOSORBIDE MONONITRATE ER 30 MG PO TB24
30.0000 mg | ORAL_TABLET | Freq: Every day | ORAL | Status: DC
  Administered 2022-10-13 – 2022-10-14 (×2): 30 mg via ORAL

## 2022-10-13 MED ORDER — ACETAMINOPHEN 325 MG PO TABS: 650.0000 mg | ORAL_TABLET | ORAL | Status: DC | PRN

## 2022-10-13 MED ORDER — ACETAMINOPHEN 500 MG PO TABS
ORAL_TABLET | ORAL | Status: AC
  Filled 2022-10-13: qty 2

## 2022-10-13 MED ORDER — ACETAMINOPHEN 10 MG/ML IV SOLN
INTRAVENOUS | Status: DC | PRN
  Administered 2022-10-13: 1000 mg via INTRAVENOUS

## 2022-10-13 MED ORDER — CEFAZOLIN SODIUM-DEXTROSE 2-4 GM/100ML-% IV SOLN
2.0000 g | INTRAVENOUS | Status: AC
  Administered 2022-10-13: 2 g via INTRAVENOUS

## 2022-10-13 MED ORDER — CARVEDILOL 12.5 MG PO TABS
25.0000 mg | ORAL_TABLET | Freq: Two times a day (BID) | ORAL | Status: DC
  Administered 2022-10-13 – 2022-10-14 (×2): 25 mg via ORAL

## 2022-10-13 MED ORDER — DEXAMETHASONE SODIUM PHOSPHATE 10 MG/ML IJ SOLN
INTRAMUSCULAR | Status: AC
  Filled 2022-10-13: qty 1

## 2022-10-13 MED ORDER — PANTOPRAZOLE SODIUM 40 MG PO TBEC
40.0000 mg | DELAYED_RELEASE_TABLET | Freq: Every day | ORAL | Status: DC
  Administered 2022-10-13 – 2022-10-14 (×2): 40 mg via ORAL

## 2022-10-13 MED ORDER — SENNA 8.6 MG PO TABS
1.0000 | ORAL_TABLET | Freq: Two times a day (BID) | ORAL | Status: DC
  Administered 2022-10-13 – 2022-10-14 (×3): 8.6 mg via ORAL
  Filled 2022-10-13: qty 1

## 2022-10-13 MED ORDER — ROSUVASTATIN CALCIUM 20 MG PO TABS
20.0000 mg | ORAL_TABLET | Freq: Every day | ORAL | Status: DC
  Administered 2022-10-13 – 2022-10-14 (×2): 20 mg via ORAL

## 2022-10-13 MED ORDER — ONDANSETRON HCL 4 MG PO TABS: 4.0000 mg | ORAL_TABLET | Freq: Four times a day (QID) | ORAL | Status: DC | PRN

## 2022-10-13 MED ORDER — ACETAMINOPHEN 500 MG PO TABS
1000.0000 mg | ORAL_TABLET | Freq: Four times a day (QID) | ORAL | Status: DC
  Administered 2022-10-13 – 2022-10-14 (×2): 1000 mg via ORAL

## 2022-10-13 MED ORDER — PANTOPRAZOLE SODIUM 40 MG PO TBEC
DELAYED_RELEASE_TABLET | ORAL | Status: AC
  Filled 2022-10-13: qty 1

## 2022-10-13 MED ORDER — BUPIVACAINE-EPINEPHRINE (PF) 0.5% -1:200000 IJ SOLN
INTRAMUSCULAR | Status: DC | PRN
  Administered 2022-10-13: 4 mL via PERINEURAL

## 2022-10-13 MED ORDER — SODIUM CHLORIDE 0.9 % IV SOLN: 250.0000 mL | INTRAVENOUS | Status: DC

## 2022-10-13 MED ORDER — SODIUM CHLORIDE 0.9% FLUSH: 3.0000 mL | INTRAVENOUS | Status: DC | PRN

## 2022-10-13 MED ORDER — KETOROLAC TROMETHAMINE 15 MG/ML IJ SOLN
7.5000 mg | Freq: Four times a day (QID) | INTRAMUSCULAR | Status: DC
  Administered 2022-10-13 – 2022-10-14 (×3): 7.5 mg via INTRAVENOUS

## 2022-10-13 MED ORDER — TAMSULOSIN HCL 0.4 MG PO CAPS
0.4000 mg | ORAL_CAPSULE | Freq: Every day | ORAL | Status: DC
  Administered 2022-10-13: 0.4 mg via ORAL

## 2022-10-13 MED ORDER — PROPOFOL 1000 MG/100ML IV EMUL
INTRAVENOUS | Status: AC
  Filled 2022-10-13: qty 100

## 2022-10-13 SURGICAL SUPPLY — 39 items
ADH SKN CLS APL DERMABOND .7 (GAUZE/BANDAGES/DRESSINGS) ×1
AGENT HMST KT MTR STRL THRMB (HEMOSTASIS) ×1
BASIN KIT SINGLE STR (MISCELLANEOUS) ×1 IMPLANT
BUR NEURO DRILL SOFT 3.0X3.8M (BURR) ×1 IMPLANT
CNTNR URN SCR LID CUP LEK RST (MISCELLANEOUS) ×1 IMPLANT
CONT SPEC 4OZ STRL OR WHT (MISCELLANEOUS)
DERMABOND ADVANCED .7 DNX12 (GAUZE/BANDAGES/DRESSINGS) ×1 IMPLANT
DRAPE C ARM PK CFD 31 SPINE (DRAPES) ×1 IMPLANT
DRAPE LAPAROTOMY 100X77 ABD (DRAPES) ×1 IMPLANT
DRAPE MICROSCOPE SPINE 48X150 (DRAPES) IMPLANT
DRSG OPSITE POSTOP 3X4 (GAUZE/BANDAGES/DRESSINGS) IMPLANT
ELECT EZSTD 165MM 6.5IN (MISCELLANEOUS) ×1
ELECT REM PT RETURN 9FT ADLT (ELECTROSURGICAL) ×1
ELECTRODE EZSTD 165MM 6.5IN (MISCELLANEOUS) ×1 IMPLANT
ELECTRODE REM PT RTRN 9FT ADLT (ELECTROSURGICAL) ×1 IMPLANT
GLOVE BIOGEL PI IND STRL 6.5 (GLOVE) ×1 IMPLANT
GLOVE SURG SYN 6.5 ES PF (GLOVE) ×1 IMPLANT
GLOVE SURG SYN 6.5 PF PI (GLOVE) ×1 IMPLANT
GLOVE SURG SYN 8.5 E (GLOVE) ×3 IMPLANT
GLOVE SURG SYN 8.5 PF PI (GLOVE) ×3 IMPLANT
GOWN SRG LRG LVL 4 IMPRV REINF (GOWNS) ×1 IMPLANT
GOWN SRG XL LVL 3 NONREINFORCE (GOWNS) ×1 IMPLANT
GOWN STRL NON-REIN TWL XL LVL3 (GOWNS) ×1
GOWN STRL REIN LRG LVL4 (GOWNS) ×1
KIT SPINAL PRONEVIEW (KITS) ×1 IMPLANT
MANIFOLD NEPTUNE II (INSTRUMENTS) ×1 IMPLANT
MARKER SKIN DUAL TIP RULER LAB (MISCELLANEOUS) ×1 IMPLANT
NDL SAFETY ECLIP 18X1.5 (MISCELLANEOUS) ×1 IMPLANT
NS IRRIG 1000ML POUR BTL (IV SOLUTION) ×1 IMPLANT
PACK LAMINECTOMY ARMC (PACKS) ×1 IMPLANT
SURGIFLO W/THROMBIN 8M KIT (HEMOSTASIS) ×1 IMPLANT
SUT DVC VLOC 3-0 CL 6 P-12 (SUTURE) ×1 IMPLANT
SUT VIC AB 0 CT1 27 (SUTURE) ×1
SUT VIC AB 0 CT1 27XCR 8 STRN (SUTURE) ×1 IMPLANT
SUT VIC AB 2-0 CT1 18 (SUTURE) ×1 IMPLANT
SYR 30ML LL (SYRINGE) ×2 IMPLANT
SYR 3ML LL SCALE MARK (SYRINGE) ×1 IMPLANT
TRAP FLUID SMOKE EVACUATOR (MISCELLANEOUS) ×1 IMPLANT
WATER STERILE IRR 1000ML POUR (IV SOLUTION) ×2 IMPLANT

## 2022-10-13 NOTE — Discharge Instructions (Signed)
Your surgeon has performed an operation on your lumbar spine (low back) to relieve pressure on one or more nerves. Many times, patients feel better immediately after surgery and can "overdo it." Even if you feel well, it is important that you follow these activity guidelines. If you do not let your back heal properly from the surgery, you can increase the chance of a disc herniation and/or return of your symptoms. The following are instructions to help in your recovery once you have been discharged from the hospital.  * It is ok to take NSAIDs after surgery.  Activity    No bending, lifting, or twisting ("BLT"). Avoid lifting objects heavier than 10 pounds (gallon milk jug).  Where possible, avoid household activities that involve lifting, bending, pushing, or pulling such as laundry, vacuuming, grocery shopping, and childcare. Try to arrange for help from friends and family for these activities while your back heals.  Increase physical activity slowly as tolerated.  Taking short walks is encouraged, but avoid strenuous exercise. Do not jog, run, bicycle, lift weights, or participate in any other exercises unless specifically allowed by your doctor. Avoid prolonged sitting, including car rides.  Talk to your doctor before resuming sexual activity.  You should not drive until cleared by your doctor.  Until released by your doctor, you should not return to work or school.  You should rest at home and let your body heal.   You may shower three days after your surgery.  After showering, lightly dab your incision dry. Do not take a tub bath or go swimming for 3 weeks, or until approved by your doctor at your follow-up appointment.  If you smoke, we strongly recommend that you quit.  Smoking has been proven to interfere with normal healing in your back and will dramatically reduce the success rate of your surgery. Please contact [facility] (800-QUIT-NOW) and use the resources at www.[facility].com for  assistance in stopping smoking.  Surgical Incision   If you have a dressing on your incision, you may remove it three days after your surgery. Keep your incision area clean and dry.  If you have staples or stitches on your incision, you should have a follow up scheduled for removal. If you do not have staples or stitches, you will have steri-strips (small pieces of surgical tape) or Dermabond glue. The steri-strips/glue should begin to peel away within about a week (it is fine if the steri-strips fall off before then). If the strips are still in place one week after your surgery, you may gently remove them.  Diet            You may return to your usual diet. Be sure to stay hydrated.  When to Contact Us  Although your surgery and recovery will likely be uneventful, you may have some residual numbness, aches, and pains in your back and/or legs. This is normal and should improve in the next few weeks.  However, should you experience any of the following, contact us immediately: New numbness or weakness Pain that is progressively getting worse, and is not relieved by your pain medications or rest Bleeding, redness, swelling, pain, or drainage from surgical incision Chills or flu-like symptoms Fever greater than 101.0 F (38.3 C) Problems with bowel or bladder functions Difficulty breathing or shortness of breath Warmth, tenderness, or swelling in your calf  Contact Information How to contact us:  If you have any questions/concerns before or after surgery, you can reach us at [phone], or you can   send a mychart message. We can be reached by phone or mychart 8am-4pm, Monday-Friday.  *Please note: Calls after 4pm are forwarded to a third party answering service. Mychart messages are not routinely monitored during evenings, weekends, and holidays. Please call our office to contact the answering service for urgent concerns during non-business hours.  

## 2022-10-13 NOTE — Anesthesia Preprocedure Evaluation (Signed)
Anesthesia Evaluation  Patient identified by MRN, date of birth, ID band Patient awake    Reviewed: Allergy & Precautions, NPO status , Patient's Chart, lab work & pertinent test results  History of Anesthesia Complications Negative for: history of anesthetic complications  Airway Mallampati: II  TM Distance: >3 FB Neck ROM: full    Dental  (+) Chipped, Poor Dentition, Missing   Pulmonary neg shortness of breath   Pulmonary exam normal        Cardiovascular hypertension, (-) angina + CAD and + CABG  (-) DOE Normal cardiovascular exam     Neuro/Psych negative neurological ROS  negative psych ROS   GI/Hepatic Neg liver ROS,GERD  Controlled,,  Endo/Other  diabetes, Type 2    Renal/GU Renal disease     Musculoskeletal   Abdominal   Peds  Hematology negative hematology ROS (+)   Anesthesia Other Findings Patient has cardiac clearance for this procedure.   Past Medical History: No date: Agent orange exposure No date: Anxiety 2022: Ascending aorta dilatation (HCC)     Comment:  a.) TTE 2022: Ao root 41 mm (per cardiology notes); b.)               TTE 09/09/2021: asc Ao 38 mm No date: Chronic painful diabetic neuropathy (HCC) No date: Coronary artery disease     Comment:  a.) LHC 04/27/2019: 99% dLM-oLAD, 50% o-pLCx, 70% pRCA,               80% p-mLAD, 70% D2 --> CVTS referral; b.) s/p 3v CABG               04/30/2019 No date: DDD (degenerative disc disease), cervical No date: Diastolic dysfunction     Comment:  a.) TTE 04/27/2019: EF 55-60%, mod conc LVH, G1DD. Nl RV              fxn. 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   (LUTS) No date: Hypertension No date: Insomnia     Comment:  a.) takes melatonin PRN No date: Iron deficiency anemia No date: Long term current use of aspirin No date: Lumbar degenerative disc disease No date: Lumbar facet arthropathy No date: Osteoarthritis No date: Paresthesia of both feet 02/21/2018: Prostate cancer (HCC)     Comment:  a.) stage T1c; Gleason 3+4; b.) s/p I-125 brachytherapy No date: Recurrent falls 04/30/2019: S/P CABG x 3     Comment:  a.) LIMA-LAD, SVG-OM1, SVG-RCA No date: Spinal stenosis, lumbar region with neurogenic claudication No date: Type 2 diabetes mellitus treated with insulin (HCC) No date: Unstable angina (HCC) No date: Wears glasses     Comment:  "wears to help double vision" No date: Wears partial dentures (upper)  Past Surgical History: 08/16/2019: COLONOSCOPY WITH PROPOFOL; N/A     Comment:  Procedure: COLONOSCOPY WITH PROPOFOL;  Surgeon: Toney Reil, MD;  Location: [facility] ENDOSCOPY;  Service:               Gastroenterology;  Laterality: N/A; 04/30/2019: CORONARY ARTERY BYPASS GRAFT; N/A     Comment:  Procedure: CORONARY ARTERY BYPASS GRAFTING (CABG) TIMES  THREE USING LEFT INTERNAL MAMMARY AND RIGHT GREATER               SAPHENOUS VEIN HARVESTED ENDOSCOPICALLY. MAMMARY ARTERY               TO LAD, SAPHENOUS VEIN GRAFT TO  OM1, SAPHENOUS VEING               GRAFT TO RIGHT. BIOPSY OF MEDIASTINAL TISSUE PLACEMENT OF              RIGHT FEMORAL A-LINE;  Surgeon: Delight Ovens, MD;                Location: [facility] OR;  Service: Open Heart Surgery;                Laterality: N/A; 07/14/2018: CYSTOSCOPY; N/A     Comment:  Procedure: CYSTOSCOPY;  Surgeon: Ihor Gully, MD;                Location: [facility];  Service: Urology;               Laterality: N/A;  no seeds in bladder per Dr Vernie Ammons 08/16/2019: ESOPHAGOGASTRODUODENOSCOPY (EGD) WITH PROPOFOL; N/A     Comment:  Procedure:  ESOPHAGOGASTRODUODENOSCOPY (EGD) WITH               PROPOFOL;  Surgeon: Toney Reil, MD;  Location:               [facility] ENDOSCOPY;  Service: Gastroenterology;  Laterality:               N/A; 04/27/2019: LEFT HEART CATH AND CORONARY ANGIOGRAPHY; N/A     Comment:  Procedure: LEFT HEART CATH AND CORONARY ANGIOGRAPHY;                Surgeon: Iran Ouch, MD;  Location: [facility] IN[facility]              LAB;  Service: Cardiovascular;  Laterality: N/A; 02-21-2018  dr Vernie Ammons in office: PROSTATE BIOPSY 07/14/2018: RADIOACTIVE SEED IMPLANT; N/A     Comment:  Procedure: RADIOACTIVE SEED IMPLANT/BRACHYTHERAPY               IMPLANT;  Surgeon: Ihor Gully, MD;  Location: [facility]               [place];  Service: Urology;  Laterality: N/A;              77 seeds  2018   in [place]: SHOULDER ARTHROSCOPY; Left     Comment:  "remove spur" 07/14/2018: SPACE OAR INSTILLATION; N/A     Comment:  Procedure: SPACE OAR INSTILLATION;  Surgeon: Ihor Gully, MD;  Location: [facility];                Service: Urology;  Laterality: N/A; 04/30/2019: TEE WITHOUT CARDIOVERSION; N/A     Comment:  Procedure: TRANSESOPHAGEAL ECHOCARDIOGRAM (TEE);                Surgeon: Delight Ovens, MD;  Location: [facility] OR;                Service: Open Heart Surgery;  Laterality: N/A;  BMI    Body Mass Index: 28.34 kg/m      Reproductive/Obstetrics negative OB ROS  Anesthesia Physical Anesthesia Plan  ASA: 3  Anesthesia Plan: General ETT   Post-op Pain Management:    Induction: Intravenous  PONV Risk Score and Plan: Ondansetron, Dexamethasone, Midazolam and Treatment may vary due to age or medical condition  Airway Management Planned: Oral ETT  Additional Equipment:   Intra-op Plan:   Post-operative Plan: Extubation in OR  Informed Consent: I have reviewed the patients History and Physical, chart, labs and  discussed the procedure including the risks, benefits and alternatives for the proposed anesthesia with the patient or authorized representative who has indicated his/her understanding and acceptance.     Dental Advisory Given  Plan Discussed with: Anesthesiologist, CRNA and Surgeon  Anesthesia Plan Comments: (Patient consented for risks of anesthesia including but not limited to:  - adverse reactions to medications - damage to eyes, teeth, lips or other oral mucosa - nerve damage due to positioning  - sore throat or hoarseness - Damage to heart, brain, nerves, lungs, other parts of body or loss of life  Patient voiced understanding.)       Anesthesia Quick Evaluation

## 2022-10-13 NOTE — Plan of Care (Signed)
  Problem: Pain Management: Goal: Pain level will decrease Outcome: Progressing   Problem: Skin Integrity: Goal: Will show signs of wound healing Outcome: Progressing   

## 2022-10-13 NOTE — Transfer of Care (Signed)
Immediate Anesthesia Transfer of Care Note  Patient: Cristian Martin  Procedure(s) Performed: L4-5 DECOMPRESSION (Back)  Patient Location: PACU  Anesthesia Type:General  Level of Consciousness: drowsy  Airway & Oxygen Therapy: Patient Spontanous Breathing and Patient connected to face mask oxygen  Post-op Assessment: Report given to RN and Post -op Vital signs reviewed and stable  Post vital signs: Reviewed and stable  Last Vitals:  Vitals Value Taken Time  BP 170/101 10/13/22 1434  Temp 35.8 1434  Pulse 82 10/13/22 1439  Resp 15 10/13/22 1439  SpO2 99 % 10/13/22 1439  Vitals shown include unfiled device data.  Last Pain:  Vitals:   10/13/22 1222  TempSrc: Tympanic  PainSc: 6          Complications: No notable events documented.

## 2022-10-13 NOTE — Op Note (Signed)
Indications: Mr. Kunaal Malave is suffering from lumbar stenosis causing neurogenic claudication (ICD10 M48.062). The patient tried and failed conservative management, prompting surgical intervention.  Findings: stenosis  Preoperative Diagnosis: Lumbar Stenosis with neurogenic claudication Postoperative Diagnosis: same   EBL: 10 ml IVF:see anesthesia record Drains: none Disposition: Extubated and Stable to PACU Complications: none  No foley catheter was placed.   Preoperative Note:  Risks of surgery discussed include: infection, bleeding, stroke, coma, death, paralysis, CSF leak, nerve/spinal cord injury, numbness, tingling, weakness, complex regional pain syndrome, recurrent stenosis and/or disc herniation, vascular injury, development of instability, neck/back pain, need for further surgery, persistent symptoms, development of deformity, and the risks of anesthesia. The patient understood these risks and agreed to proceed.  Operative Note:   1. L4-5 lumbar decompression including central laminectomy and bilateral medial facetectomies including foraminotomies  The patient was then brought from the preoperative center with intravenous access established.  The patient underwent general anesthesia and endotracheal tube intubation, and was then rotated on the [place] rail top where all pressure points were appropriately padded.  The skin was then thoroughly cleansed.  Perioperative antibiotic prophylaxis was administered.  Sterile prep and drapes were then applied and a timeout was then observed.  C-arm was brought into the field under sterile conditions and under lateral visualization the L4-5 interspace was identified and marked.  The incision was marked on the left and injected with local anesthetic. Once this was complete a 3 cm incision was opened with the use of a #10 blade knife.    The metrx tubes were sequentially advanced and confirmed in position at L4-5. An 18mm by 50mm tube was  locked in place to the bed side attachment.  The microscope was then sterilely brought into the field and muscle creep was hemostased with a bipolar and resected with a pituitary rongeur.  A Bovie extender was then used to expose the spinous process and lamina.  Careful attention was placed to not violate the facet capsule. A 3 mm matchstick drill bit was then used to make a hemi-laminotomy trough until the ligamentum flavum was exposed.  This was extended to the base of the spinous process and to the contralateral side to remove all the central bone from each side.  Once this was complete and the underlying ligamentum flavum was visualized, it was dissected with a curette and resected with Kerrison rongeurs.  Extensive ligamentum hypertrophy was noted, requiring a substantial amount of time and care for removal.  The dura was identified and palpated. The kerrison rongeur was then used to remove the medial facet bilaterally until no compression was noted.  A balltip probe was used to confirm decompression of the ipsilateral L5 nerve root.  Additional attention was paid to completion of the contralateral L4-5 foraminotomy until the contralateral traversing nerve root was completely free.  Once this was complete, L4-5 central decompression including medial facetectomy and foraminotomy was confirmed and decompression on both sides was confirmed. No CSF leak was noted.  Depo-Medrol was placed on the nerve root.  The wound was copiously irrigated. The tube system was then removed under microscopic visualization and hemostasis was obtained with a bipolar.    The fascial layer was reapproximated with the use of a 0 Vicryl suture.  Subcutaneous tissue layer was reapproximated using 2-0 Vicryl suture.  3-0 monocryl was placed in subcuticular fashion. The skin was then cleansed and Dermabond was used to close the skin opening.  Patient was then rotated back to the preoperative bed  awakened from anesthesia and taken to  recovery all counts are correct in this case.  I performed the entire procedure with the assistance of Manning Charity PA as an Designer, television/film set. An assistant was required for this procedure due to the complexity.  The assistant provided assistance in tissue manipulation and suction, and was required for the successful and safe performance of the procedure. I performed the critical portions of the procedure.   Alva Broxson K. Myer Haff MD

## 2022-10-13 NOTE — Anesthesia Procedure Notes (Signed)
Procedure Name: Intubation Date/Time: 10/13/2022 1:17 PM  Performed by: Morene Crocker, CRNAPre-anesthesia Checklist: Patient identified, Patient being monitored, Timeout performed, Emergency Drugs available and Suction available Patient Re-evaluated:Patient Re-evaluated prior to induction Oxygen Delivery Method: Circle system utilized Preoxygenation: Pre-oxygenation with 100% oxygen Induction Type: IV induction Ventilation: Mask ventilation without difficulty Laryngoscope Size: 3 and McGraph Grade View: Grade I Tube type: Oral Tube size: 7.5 mm Number of attempts: 1 Airway Equipment and Method: Stylet Placement Confirmation: ETT inserted through vocal cords under direct vision, positive ETCO2 and breath sounds checked- equal and bilateral Secured at: 22 cm Tube secured with: Tape Dental Injury: Teeth and Oropharynx as per pre-operative assessment  Comments: Smooth atraumatic intubation, no complications noted

## 2022-10-13 NOTE — Interval H&P Note (Signed)
History and Physical Interval Note:  10/13/2022 12:38 PM  Cristian Martin  has presented today for surgery, with the diagnosis of Neurogenic claudication due to lumbar spinal stenosis  M48.062.  The various methods of treatment have been discussed with the patient and family. After consideration of risks, benefits and other options for treatment, the patient has consented to  Procedure(s): L4-5 DECOMPRESSION (N/A) as a surgical intervention.  The patient's history has been reviewed, patient examined, no change in status, stable for surgery.  I have reviewed the patient's chart and labs.  Questions were answered to the patient's satisfaction.    Heart sounds normal no MRG. Chest Clear to Auscultation Bilaterally.   Carnelius Hammitt

## 2022-10-14 ENCOUNTER — Encounter: Payer: Self-pay | Admitting: Neurosurgery

## 2022-10-14 DIAGNOSIS — M48062 Spinal stenosis, lumbar region with neurogenic claudication: Secondary | ICD-10-CM | POA: Diagnosis not present

## 2022-10-14 LAB — GLUCOSE, CAPILLARY: Glucose-Capillary: 184 mg/dL — ABNORMAL HIGH (ref 70–99)

## 2022-10-14 MED ORDER — CARVEDILOL 12.5 MG PO TABS
ORAL_TABLET | ORAL | Status: AC
  Filled 2022-10-14: qty 2

## 2022-10-14 MED ORDER — OXYCODONE HCL 5 MG PO TABS: 5.0000 mg | ORAL_TABLET | ORAL | 0 refills | Status: DC | PRN

## 2022-10-14 MED ORDER — METFORMIN HCL ER 500 MG PO TB24
ORAL_TABLET | ORAL | Status: AC
  Filled 2022-10-14: qty 2

## 2022-10-14 MED ORDER — DOCUSATE SODIUM 100 MG PO CAPS
ORAL_CAPSULE | ORAL | Status: AC
  Filled 2022-10-14: qty 1

## 2022-10-14 MED ORDER — PANTOPRAZOLE SODIUM 40 MG PO TBEC
DELAYED_RELEASE_TABLET | ORAL | Status: AC
  Filled 2022-10-14: qty 1

## 2022-10-14 MED ORDER — KETOROLAC TROMETHAMINE 15 MG/ML IJ SOLN
INTRAMUSCULAR | Status: AC
  Filled 2022-10-14: qty 1

## 2022-10-14 MED ORDER — DOCUSATE SODIUM 100 MG PO CAPS: 100.0000 mg | ORAL_CAPSULE | Freq: Two times a day (BID) | ORAL | 0 refills | Status: DC

## 2022-10-14 MED ORDER — ENOXAPARIN SODIUM 40 MG/0.4ML IJ SOSY
PREFILLED_SYRINGE | INTRAMUSCULAR | Status: AC
  Filled 2022-10-14: qty 0.4

## 2022-10-14 MED ORDER — ACETAMINOPHEN 500 MG PO TABS
ORAL_TABLET | ORAL | Status: AC
  Filled 2022-10-14: qty 2

## 2022-10-14 MED ORDER — ISOSORBIDE MONONITRATE ER 30 MG PO TB24
ORAL_TABLET | ORAL | Status: AC
  Filled 2022-10-14: qty 1

## 2022-10-14 MED ORDER — ROSUVASTATIN CALCIUM 20 MG PO TABS
ORAL_TABLET | ORAL | Status: AC
  Filled 2022-10-14: qty 1

## 2022-10-14 MED ORDER — LOSARTAN POTASSIUM 50 MG PO TABS
ORAL_TABLET | ORAL | Status: AC
  Filled 2022-10-14: qty 2

## 2022-10-14 MED ORDER — ORAL CARE MOUTH RINSE: 15.0000 mL | OROMUCOSAL | Status: DC | PRN

## 2022-10-14 MED ORDER — METHOCARBAMOL 500 MG PO TABS: 500.0000 mg | ORAL_TABLET | Freq: Four times a day (QID) | ORAL | 0 refills | Status: DC | PRN

## 2022-10-14 NOTE — Evaluation (Signed)
Occupational Therapy Evaluation Patient Details Name: Cristian Martin MRN: [id number] DOB: 1946/01/17 Today's Date: 10/14/2022   History of Present Illness Pt is 77 y.o. male s/p L4-L5 decompression.  Pt has PMH of DM2, DDD, GERD< HTN, Insomnia, OA, paraesthesia of both feet, Prostate cancer, unstable angina, diplopia.   Clinical Impression   Upon entering the room, pt supine in bed with no c/o pain and is agreeable to OT intervention. Pt reports living at home with family who is able to assist as needed. Pt has all needed equipment at home. OT reviewed back precautions again with pt and he verbalized understanding. He was studying precautions education sheet when therapist arrived to his room. Pt performs supine >sit without assistance and is able to demonstrate figure four position to doff B socks and don B shoes. Pt ambulating in room with supervision for safety. Pt with no further OT intervention this session. OT to complete orders.             Equipment Recommendations  None recommended by OT       Precautions / Restrictions Precautions Precautions: Back Precaution Booklet Issued: Yes (comment) Restrictions Weight Bearing Restrictions: No      Mobility Bed Mobility Overal bed mobility: Modified Independent                  Transfers Overall transfer level: Needs assistance Equipment used: Quad cane Transfers: Sit to/from Stand Sit to Stand: Supervision                  Balance Overall balance assessment: Needs assistance   Sitting balance-Leahy Scale: Good     Standing balance support: Single extremity supported, During functional activity, Reliant on assistive device for balance Standing balance-Leahy Scale: Good                             ADL either performed or assessed with clinical judgement   ADL Overall ADL's : Needs assistance/impaired                                       General ADL Comments: supervision  overall with cuing for technique with precautions     Vision Patient Visual Report: No change from baseline              Pertinent Vitals/Pain Pain Assessment Pain Assessment: No/denies pain     Extremity/Trunk Assessment Upper Extremity Assessment Upper Extremity Assessment: Overall WFL for tasks assessed   Lower Extremity Assessment Lower Extremity Assessment: Generalized weakness   Cervical / Trunk Assessment Cervical / Trunk Assessment: Normal   Communication Communication Communication: No apparent difficulties   Cognition Arousal: Alert Behavior During Therapy: WFL for tasks assessed/performed Overall Cognitive Status: Within Functional Limits for tasks assessed                                                  Home Living Family/patient expects to be discharged to:: Private residence Living Arrangements: Spouse/significant other Available Help at Discharge: Family;Available PRN/intermittently Type of Home: House Home Access: Ramped entrance     Home Layout: One level     Bathroom Shower/Tub: Chief Strategy Officer: Handicapped height     Home Equipment:  Rolling Walker (2 wheels);Rollator (4 wheels);Shower seat;Cane - quad;Grab bars - tub/shower          Prior Functioning/Environment Prior Level of Function : Independent/Modified Independent                                 OT Goals(Current goals can be found in the care plan section) Acute Rehab OT Goals Patient Stated Goal: to go home OT Goal Formulation: With patient Time For Goal Achievement: 10/14/22 Potential to Achieve Goals: Fair  OT Frequency:         AM-PAC OT "6 Clicks" Daily Activity     Outcome Measure Help from another person eating meals?: None Help from another person taking care of personal grooming?: None Help from another person toileting, which includes using toliet, bedpan, or urinal?: None Help from another person bathing  (including washing, rinsing, drying)?: None Help from another person to put on and taking off regular upper body clothing?: None Help from another person to put on and taking off regular lower body clothing?: None 6 Click Score: 24   End of Session Nurse Communication: Mobility status  Activity Tolerance: Patient tolerated treatment well Patient left: in bed;with call bell/phone within reach                   Time: 1052-1103 OT Time Calculation (min): 11 min Charges:  OT General Charges $OT Visit: 1 Visit OT Evaluation $OT Eval Low Complexity: 1 Low  Jackquline Denmark, MS, OTR/L , CBIS ascom [phone]  10/14/22, 1:01 PM

## 2022-10-14 NOTE — Anesthesia Postprocedure Evaluation (Signed)
Anesthesia Post Note  Patient: Cristian Martin  Procedure(s) Performed: L4-5 DECOMPRESSION (Back)  Patient location during evaluation: PACU Anesthesia Type: General Level of consciousness: awake and alert Pain management: pain level controlled Vital Signs Assessment: post-procedure vital signs reviewed and stable Respiratory status: spontaneous breathing, nonlabored ventilation, respiratory function stable and patient connected to nasal cannula oxygen Cardiovascular status: blood pressure returned to baseline and stable Postop Assessment: no apparent nausea or vomiting Anesthetic complications: no   No notable events documented.   Last Vitals:  Vitals:   10/14/22 0055 10/14/22 0523  BP: 104/71 122/61  Pulse: 87 78  Resp: 16 16  Temp: 36.7 C 36.7 C  SpO2: 91% 93%    Last Pain:  Vitals:   10/14/22 0523  TempSrc: Temporal  PainSc: 0-No pain                 Cleda Mccreedy Teruko Joswick

## 2022-10-14 NOTE — Progress Notes (Signed)
   Neurosurgery Progress Note  History: Cristian Martin is here for lumbar spinal stenosis with neurogenic claudication.  POD 1: Doing well.  Able to stand at side of bed.  Pain is controlled.    Physical Exam: Vitals:   10/14/22 0523 10/14/22 0738  BP: 122/61 135/65  Pulse: 78 66  Resp: 16 16  Temp: 98.1 F (36.7 C) 97.7 F (36.5 C)  SpO2: 93% 96%    AA Ox3 CNI  Strength:5/5 throughout BLE  Data:  Other tests/results: n/a  Assessment/Plan:  Cristian Martin is doing well after lumbar decompression  - mobilize - pain control - DVT prophylaxis - PTOT   Venetia Night MD, [facility] Department of Neurosurgery

## 2022-10-14 NOTE — Progress Notes (Signed)
DISCHARGE NOTE:  Pt and wife given discharge instructions and verbalized understanding. Pt wheeled to car by staff, wife providing transportation home.

## 2022-10-14 NOTE — Evaluation (Signed)
Physical Therapy Evaluation Patient Details Name: Cristian Martin MRN: [id number] DOB: Jun 11, 1945 Today's Date: 10/14/2022  History of Present Illness  Pt is 77 y.o. male s/p L4-L5 decompression.  Pt has PMH of DM2, DDD, GERD< HTN, Insomnia, OA, paraesthesia of both feet, Prostate cancer, unstable angina, diplopia.   Clinical Impression  Pt received in Semi-Fowler's position and agreeable to therapy.  Pt performed well with the instructions given by therapist and was able to perform bed mobility with good technique abiding by precautions.  Pt ambulated down the hallway and performed stair training with use of B handrails.  Pt does have some instability with ambulation at times, but is able to self-correct.  Pt would likely benefit from outpatient PT to assist with balance deficits currently.  Pt returned to bed and left for OT arrival.          If plan is discharge home, recommend the following: A little help with walking and/or transfers;Assistance with cooking/housework;Assist for transportation;Help with stairs or ramp for entrance   Can travel by private vehicle        Equipment Recommendations None recommended by PT  Recommendations for Other Services       Functional Status Assessment Patient has had a recent decline in their functional status and demonstrates the ability to make significant improvements in function in a reasonable and predictable amount of time.     Precautions / Restrictions Precautions Precautions: Back Precaution Booklet Issued: Yes (comment) Restrictions Weight Bearing Restrictions: No      Mobility  Bed Mobility Overal bed mobility: Modified Independent             General bed mobility comments: pt requiring verbal cues for log rolling technique, but able to perform on his own.    Transfers Overall transfer level: Needs assistance Equipment used: Quad cane Transfers: Sit to/from Stand Sit to Stand: Contact guard assist            General transfer comment: pt with reduced balance when in standing and utilizes the quad cane for support.    Ambulation/Gait Ambulation/Gait assistance: Contact guard assist Gait Distance (Feet): 140 Feet Assistive device: Quad cane Gait Pattern/deviations: Step-through pattern Gait velocity: decreased     General Gait Details: Pt staggering at times but is able to self-correct.  Pt just unaware of surroundings at times and would benefit from continued utilizing AD.  Stairs            Wheelchair Mobility     Tilt Bed    Modified Rankin (Stroke Patients Only)       Balance Overall balance assessment: Needs assistance Sitting-balance support: Feet unsupported, No upper extremity supported Sitting balance-Leahy Scale: Fair     Standing balance support: Single extremity supported, During functional activity, Reliant on assistive device for balance Standing balance-Leahy Scale: Fair                               Pertinent Vitals/Pain Pain Assessment Pain Assessment: No/denies pain    Home Living Family/patient expects to be discharged to:: Private residence Living Arrangements: Spouse/significant other Available Help at Discharge: Family Type of Home: House Home Access: Ramped entrance       Home Layout: One level Home Equipment: Agricultural consultant (2 wheels);Rollator (4 wheels);Shower seat;Cane - quad;Grab bars - tub/shower      Prior Function Prior Level of Function : Independent/Modified Independent  Extremity/Trunk Assessment   Upper Extremity Assessment Upper Extremity Assessment: Generalized weakness    Lower Extremity Assessment Lower Extremity Assessment: Generalized weakness    Cervical / Trunk Assessment Cervical / Trunk Assessment: Normal  Communication   Communication Communication: No apparent difficulties  Cognition Arousal: Alert Behavior During Therapy: WFL for tasks  assessed/performed Overall Cognitive Status: Within Functional Limits for tasks assessed                                          General Comments      Exercises     Assessment/Plan    PT Assessment All further PT needs can be met in the next venue of care  PT Problem List Decreased strength;Decreased activity tolerance;Decreased balance;Decreased mobility;Decreased knowledge of use of DME;Decreased safety awareness       PT Treatment Interventions      PT Goals (Current goals can be found in the Care Plan section)  Acute Rehab PT Goals Patient Stated Goal: to get back to normal. PT Goal Formulation: With patient Time For Goal Achievement: 10/28/22 Potential to Achieve Goals: Good    Frequency       Co-evaluation               AM-PAC PT "6 Clicks" Mobility  Outcome Measure Help needed turning from your back to your side while in a flat bed without using bedrails?: A Little Help needed moving from lying on your back to sitting on the side of a flat bed without using bedrails?: A Little Help needed moving to and from a bed to a chair (including a wheelchair)?: None Help needed standing up from a chair using your arms (e.g., wheelchair or bedside chair)?: None Help needed to walk in hospital room?: A Little Help needed climbing 3-5 steps with a railing? : A Little 6 Click Score: 20    End of Session Equipment Utilized During Treatment: Gait belt Activity Tolerance: Patient tolerated treatment well Patient left: in bed;with call bell/phone within reach;with bed alarm set Nurse Communication: Mobility status PT Visit Diagnosis: Unsteadiness on feet (R26.81);Other abnormalities of gait and mobility (R26.89);Repeated falls (R29.6);Muscle weakness (generalized) (M62.81);History of falling (Z91.81);Difficulty in walking, not elsewhere classified (R26.2)    Time: 0981-1914 PT Time Calculation (min) (ACUTE ONLY): 18 min   Charges:   PT  Evaluation $PT Eval Low Complexity: 1 Low   PT General Charges $$ ACUTE PT VISIT: 1 Visit         Nolon Bussing, PT, DPT Physical Therapist - [place]  [facility]  10/14/22, 11:57 AM

## 2022-10-14 NOTE — Plan of Care (Signed)
  Problem: Education: Goal: Ability to verbalize activity precautions or restrictions will improve Outcome: Progressing   Problem: Activity: Goal: Will remain free from falls Outcome: Progressing   Problem: Pain Management: Goal: Pain level will decrease Outcome: Progressing   Problem: Bladder/Genitourinary: Goal: Urinary functional status for postoperative course will improve Outcome: Progressing

## 2022-10-14 NOTE — Discharge Summary (Signed)
Physician Discharge Summary  Patient ID: JEORGE MUNRO MRN: [id number] DOB/AGE: 1946-01-21 77 y.o.  Admit date: 10/13/2022 Discharge date: 10/14/2022  Admission Diagnoses:Active Problems:   Lumbar stenosis with neurogenic claudication  Discharge Diagnoses:  Principal Problem:   Lumbar radiculopathy Active Problems:   Lumbar stenosis with neurogenic claudication   Discharged Condition: good  Hospital Course: Mr. Tims presented for surgical intervention for neurogenic claudication.  He did very well with surgery and was mobilized on postoperative day 1.  He is felt to be stable for discharge.  Consults: None  Significant Diagnostic Studies: radiology: X-Ray: localization  Treatments: surgery: L4-5 decompression  Discharge Exam: Blood pressure 135/65, pulse 66, temperature 97.7 F (36.5 C), temperature source Oral, resp. rate 16, height 5\' 10"  (1.778 m), weight 89.6 kg, SpO2 96%. General appearance: alert and cooperative CNI MAEW  Disposition: Discharge disposition: 01-Home or Self Care       Discharge Instructions     Discharge patient   Complete by: As directed    Discharge disposition: 01-Home or Self Care   Discharge patient date: 10/14/2022   Incentive spirometry RT   Complete by: As directed       Allergies as of 10/14/2022       Reactions   Ace Inhibitors Other (See Comments)   Hyperkalemia.        Medication List     TAKE these medications    aspirin EC 81 MG tablet Take 1 tablet (81 mg total) by mouth daily. Swallow whole.   carvedilol 25 MG tablet Commonly known as: COREG Take 25 mg by mouth 2 (two) times daily with a meal.   diclofenac Sodium 1 % Gel Commonly known as: VOLTAREN Apply topically 4 (four) times daily.   docusate sodium 100 MG capsule Commonly known as: COLACE Take 1 capsule (100 mg total) by mouth 2 (two) times daily.   DULoxetine 60 MG capsule Commonly known as: CYMBALTA Take 60 mg by mouth daily.   gabapentin  600 MG tablet Commonly known as: NEURONTIN Take 1 tablet by mouth at bedtime.   isosorbide mononitrate 30 MG 24 hr tablet Commonly known as: IMDUR Take 30 mg by mouth daily.   losartan 100 MG tablet Commonly known as: COZAAR Take 100 mg by mouth daily.   melatonin 3 MG Tabs tablet Take 3 mg by mouth at bedtime.   metFORMIN 500 MG 24 hr tablet Commonly known as: GLUCOPHAGE-XR Take 1,000 mg by mouth in the morning and at bedtime.   methocarbamol 500 MG tablet Commonly known as: ROBAXIN Take 1 tablet (500 mg total) by mouth every 6 (six) hours as needed for muscle spasms.   MULTIVITAMIN ADULT PO Take 1 tablet by mouth daily.   NovoLOG Mix 70/30 FlexPen (70-30) 100 UNIT/ML FlexPen Generic drug: insulin aspart protamine - aspart Inject 0.2 mLs (20 Units total) into the skin 2 (two) times daily before a meal. As the patient and I discussed, start with a lower dose of Insulin as he has not been eating well. Titrate Insulin upward to dose taken prior to surgery (60 units bid) as tolerates. What changed:  how much to take additional instructions   omeprazole 20 MG capsule Commonly known as: PRILOSEC Take 20 mg by mouth 2 (two) times daily.   oxyCODONE 5 MG immediate release tablet Commonly known as: Oxy IR/ROXICODONE Take 1 tablet (5 mg total) by mouth every 4 (four) hours as needed for moderate pain ((score 4 to 6)).   OZEMPIC (1 MG/DOSE) [place]  Inject 1 mg into the skin every Sunday.   rosuvastatin 40 MG tablet Commonly known as: CRESTOR Take 1 tablet (40 mg total) by mouth daily. What changed: how much to take   tamsulosin 0.4 MG Caps capsule Commonly known as: FLOMAX Take 0.4 mg by mouth at bedtime.        Follow-up Information     Drake Leach, PA-C Follow up on 10/26/2022.   Specialty: Neurosurgery Contact information: [address] [place] [address] [phone]                 Signed: Venetia Night 10/14/2022, 12:17  PM

## 2022-10-22 NOTE — Progress Notes (Unsigned)
   REFERRING PHYSICIAN:  [facility] [address] [place],  [place] [address]  DOS: 10/13/22  L4-L5 lumbar decompression   HISTORY OF PRESENT ILLNESS: DRISTON ADERHOLT is approximately 2 weeks status post above surgery. Was given robaxin and oxycodone on discharge from the hospital.   He is doing well. His preop leg pain is mostly gone. He has some mild back soreness. He is not taking oxycodone or robaxin.   He is taking neurontin.   PHYSICAL EXAMINATION:  General: Patient is well developed, well nourished, calm, collected, and in no apparent distress.   NEUROLOGICAL:  General: In no acute distress.   Awake, alert, oriented to person, place, and time.  Pupils equal round and reactive to light.  Facial tone is symmetric.     Strength:            Side Iliopsoas Quads Hamstring PF DF EHL  R 5 5 5 5 5 5   L 5 5 5 5 5 5    Incision c/d/i   ROS (Neurologic):  Negative except as noted above  IMAGING: Nothing new to review.   ASSESSMENT/PLAN:  RAMIRO COLE is doing well s/p above surgery. Treatment options reviewed with patient and following plan made:   - I have advised the patient to lift up to 10 pounds until 6 weeks after surgery (follow up with Dr. Myer Haff).  - Reviewed wound care.  - No bending, twisting, or lifting.  - Continue on current medications including neurontin.  - Follow up as scheduled in 4 weeks and prn.   Advised to contact the office if any questions or concerns arise.  Drake Leach PA-C Department of neurosurgery

## 2022-10-26 ENCOUNTER — Ambulatory Visit (INDEPENDENT_AMBULATORY_CARE_PROVIDER_SITE_OTHER): Payer: No Typology Code available for payment source | Admitting: Orthopedic Surgery

## 2022-10-26 ENCOUNTER — Encounter: Payer: Self-pay | Admitting: Orthopedic Surgery

## 2022-10-26 VITALS — BP 112/80 | Temp 98.1°F | Ht 71.0 in | Wt 195.0 lb

## 2022-10-26 DIAGNOSIS — Z09 Encounter for follow-up examination after completed treatment for conditions other than malignant neoplasm: Secondary | ICD-10-CM

## 2022-10-26 DIAGNOSIS — Z9889 Other specified postprocedural states: Secondary | ICD-10-CM

## 2022-10-26 DIAGNOSIS — M48062 Spinal stenosis, lumbar region with neurogenic claudication: Secondary | ICD-10-CM

## 2022-11-25 ENCOUNTER — Ambulatory Visit: Payer: No Typology Code available for payment source | Admitting: Neurosurgery

## 2022-11-25 ENCOUNTER — Encounter: Payer: Self-pay | Admitting: Neurosurgery

## 2022-11-25 VITALS — BP 138/80 | Ht 71.0 in | Wt 195.0 lb

## 2022-11-25 DIAGNOSIS — Z09 Encounter for follow-up examination after completed treatment for conditions other than malignant neoplasm: Secondary | ICD-10-CM

## 2022-11-25 DIAGNOSIS — M48062 Spinal stenosis, lumbar region with neurogenic claudication: Secondary | ICD-10-CM

## 2022-11-25 NOTE — Progress Notes (Signed)
   REFERRING PHYSICIAN:  [facility] [address] [place],  [place] [address]  DOS: 10/13/22  L4-L5 lumbar decompression   HISTORY OF PRESENT ILLNESS: Cristian Martin is status post above surgery.  Cristian Martin is doing well.  Cristian Martin has some discomfort but no overt pain.  The symptoms are much improved compared to before surgery.  PHYSICAL EXAMINATION:  General: Patient is well developed, well nourished, calm, collected, and in no apparent distress.   NEUROLOGICAL:  General: In no acute distress.   Awake, alert, oriented to person, place, and time.  Pupils equal round and reactive to light.  Facial tone is symmetric.     Strength:            Side Iliopsoas Quads Hamstring PF DF EHL  R 5 5 5 5 5 5   L 5 5 5 5 5 5    Incision c/d/i   ROS (Neurologic):  Negative except as noted above  IMAGING: Nothing new to review.   ASSESSMENT/PLAN:  Cristian Martin is doing well s/p above surgery.  I am very pleased with his response to surgery.  I feel that Cristian Martin will continue to improve.  We will see him back in clinic in approximate 6 weeks.  We reviewed his activity limitations.    Venetia Night MD Department of neurosurgery

## 2023-01-03 NOTE — Progress Notes (Unsigned)
   REFERRING PHYSICIAN:  [facility] [address] [place],  [place] [address]  DOS: 10/13/22  L4-L5 lumbar decompression   HISTORY OF PRESENT ILLNESS:  He was doing well at his last visit with only discomfort.   His preop leg pain is better, but he feels like he still has weakness in the legs. They feel like they will give out sometimes when walking. He also notes increased pain in his back with walking. This improves when he stops to rest. He has no pain with sitting.   He notes some balance issues. No dexterity issues. No neck or arm pain.   He is taking neurontin and prn robaxin.    PHYSICAL EXAMINATION:  General: Patient is well developed, well nourished, calm, collected, and in no apparent distress.   NEUROLOGICAL:  General: In no acute distress.   Awake, alert, oriented to person, place, and time.  Pupils equal round and reactive to light.  Facial tone is symmetric.     Strength:           Side Iliopsoas Quads Hamstring PF DF EHL  R 5 5 5 5 5 5   L 5 5 5 5 5 5    Incision well healed.   Sensation intact to light touch in bilateral lower extremities.   He ambulates with a cane.   He has 5/5 strength in bilateral upper extremities. Negative hoffmans bilaterally.    ROS (Neurologic):  Negative except as noted above  IMAGING: Nothing new to review.   ASSESSMENT/PLAN:  Cristian Martin is doing fair after above surgery. He feels like he still has weakness in the legs. They feel like they will give out sometimes when walking. He also notes increased pain in his back with walking. This improves when he stops to rest.   He does feel like his preop leg pain is better.   He has known lumbar spondylosis with mild central stenosis L2-L4. Also with moderate bilateral foraminal stenosis L5-S1.   Treatment options reviewed with patient and following plan made:   - He can slowly return to activity as tolerated.  - PT for lumbar spine ordered at [facility].  - He would  like to consider injections. Will refer back to Dr. Cherylann Ratel.  - If no improvement with above, may consider updated lumbar MRI.  - Follow up with me in 6-8 weeks and prn.   Advised to contact the office if any questions or concerns arise.  Drake Leach PA-C Department of neurosurgery

## 2023-01-04 ENCOUNTER — Ambulatory Visit (INDEPENDENT_AMBULATORY_CARE_PROVIDER_SITE_OTHER): Payer: No Typology Code available for payment source | Admitting: Orthopedic Surgery

## 2023-01-04 ENCOUNTER — Encounter: Payer: Self-pay | Admitting: Orthopedic Surgery

## 2023-01-04 VITALS — BP 136/82 | Ht 71.0 in | Wt 195.0 lb

## 2023-01-04 DIAGNOSIS — Z9889 Other specified postprocedural states: Secondary | ICD-10-CM

## 2023-01-04 DIAGNOSIS — M48062 Spinal stenosis, lumbar region with neurogenic claudication: Secondary | ICD-10-CM

## 2023-01-04 NOTE — Patient Instructions (Signed)
It was so nice to see you today. Thank you so much for coming in.    I sent physical therapy orders to [facility]. They should call you.   I want you to see pain management here in [place] (Dr. Cherylann Ratel) to discuss possible injections. They should call you to schedule an appointment or you can call them at [phone].   I will see you back in 6-8 weeks. Please do not hesitate to call if you have any questions or concerns. You can also message me in MyChart.   Drake Leach PA-C [phone]     The physicians and staff at [facility] are committed to providing excellent care. You may receive a survey asking for feedback about your experience at our office. We value you your feedback and appreciate you taking the time to to fill it out. The [facility] leadership team is also available to discuss your experience in person, feel free to contact us [phone].

## 2023-03-01 ENCOUNTER — Ambulatory Visit: Payer: No Typology Code available for payment source | Admitting: Orthopedic Surgery

## 2023-03-15 ENCOUNTER — Telehealth: Payer: Self-pay | Admitting: Physical Therapy

## 2023-03-15 NOTE — Telephone Encounter (Signed)
Called pt to inquire about upcoming eval and whether he wanted to move up apt given increased availability. PT spoke to pt's wife who said he had fallen and he was experiencing excruciating low pain. Pt advised pt's wife to seek additional medical treatment for further medical evaluation and treatment given the severity of his pain and that he had worsening pain. PT to cancel upcoming eval to allow pt to be seen by a provider before eval.

## 2023-03-16 ENCOUNTER — Ambulatory Visit: Payer: No Typology Code available for payment source | Admitting: Physical Therapy

## 2023-03-22 ENCOUNTER — Ambulatory Visit: Payer: Medicare PPO | Admitting: Physical Therapy

## 2023-03-22 ENCOUNTER — Telehealth: Payer: Self-pay | Admitting: Physical Therapy

## 2023-03-22 ENCOUNTER — Encounter: Payer: No Typology Code available for payment source | Admitting: Physical Therapy

## 2023-03-22 NOTE — Telephone Encounter (Signed)
Called pt to inquire about his ongoing low back severity and whether he would be coming into physical therapy. Pt's wife answered call and said she was in process of following up with referring provider to get her husband scheduled. He has continued to be near bed bound and he needs further medical treatment and evaluation. PT recommended that he be taken off schedule for now until his medical team determined best course of action. Pt's wife is in agreement with this plan. He is going to be take off schedule at this point.

## 2023-03-23 ENCOUNTER — Emergency Department: Payer: No Typology Code available for payment source

## 2023-03-23 ENCOUNTER — Emergency Department
Admission: EM | Admit: 2023-03-23 | Discharge: 2023-03-23 | Disposition: A | Discharge status: Home / Self Care | Payer: No Typology Code available for payment source | Attending: Emergency Medicine | Admitting: Emergency Medicine

## 2023-03-23 DIAGNOSIS — M549 Dorsalgia, unspecified: Secondary | ICD-10-CM | POA: Diagnosis present

## 2023-03-23 DIAGNOSIS — W19XXXA Unspecified fall, initial encounter: Secondary | ICD-10-CM | POA: Diagnosis not present

## 2023-03-23 MED ORDER — LIDOCAINE 5 % EX PTCH
1.0000 | MEDICATED_PATCH | Freq: Once | CUTANEOUS | Status: DC
  Administered 2023-03-23: 1 via TRANSDERMAL
  Filled 2023-03-23: qty 1

## 2023-03-23 MED ORDER — HYDROCODONE-ACETAMINOPHEN 5-325 MG PO TABS: 0.5000 | ORAL_TABLET | Freq: Four times a day (QID) | ORAL | 0 refills | Status: AC | PRN

## 2023-03-23 MED ORDER — KETOROLAC TROMETHAMINE 15 MG/ML IJ SOLN
15.0000 mg | Freq: Once | INTRAMUSCULAR | Status: AC
  Administered 2023-03-23: 15 mg via INTRAMUSCULAR
  Filled 2023-03-23: qty 1

## 2023-03-23 MED ORDER — OXYCODONE-ACETAMINOPHEN 5-325 MG PO TABS
1.0000 | ORAL_TABLET | Freq: Once | ORAL | Status: AC
  Administered 2023-03-23: 1 via ORAL
  Filled 2023-03-23: qty 1

## 2023-03-23 NOTE — ED Notes (Signed)
Pt incontinent of urine. New bed linens on and bed and clean brief on pt.

## 2023-03-23 NOTE — Discharge Instructions (Signed)
You were seen in the ER for your back pain after recent fall.  Your testing fortunately did not show serious injuries.  You can take Tylenol and ibuprofen to help with pain unless there is another reason you should not take these.  If you have breakthrough pain, I sent a short course of narcotic pain medicine to your pharmacy.  This can make you drowsy, do not drive or operate machinery when taking this.  Follow-up your primary care doctor for further evaluation.  Please also keep your scheduled follow-up with your spine doctor.  Return to the ER for any new or worsening symptoms.

## 2023-03-23 NOTE — ED Provider Notes (Signed)
[facility] Provider Note    Event Date/Time   First MD Initiated Contact with Patient 03/23/23 [phone]     (approximate)   History   Back Pain   HPI  IHAN PAT is a 78 year old male with history of lumbar radiculopathy s/p L4-L5 decompression in September of last year presenting to the emergency department for evaluation of upper back pain after a fall.  Has had ongoing balance issues for several months, awaiting outpatient therapy.  2 weeks ago he slipped and fell backwards.  Did not notice any injuries at that time, but since then has had worsening upper back pain in his thoracic region.  No new numbness, tingling, focal weakness.  Yesterday, slid down in his recliner, did not hit his head, but was unable to get up by himself so EMS was called.  No fevers or chills.  No new bowel or bladder symptoms.     Physical Exam   Triage Vital Signs: ED Triage Vitals  Encounter Vitals Group     BP 03/23/23 0517 (!) 189/100     Systolic BP Percentile --      Diastolic BP Percentile --      Pulse Rate 03/23/23 0517 95     Resp 03/23/23 0517 18     Temp 03/23/23 0517 98.6 F (37 C)     Temp Source 03/23/23 0517 Oral     SpO2 03/23/23 0517 95 %     Weight --      Height --      Head Circumference --      Peak Flow --      Pain Score 03/23/23 0520 10     Pain Loc --      Pain Education --      Exclude from Growth Chart --     Most recent vital signs: Vitals:   03/23/23 0517 03/23/23 0630  BP: (!) 189/100 (!) 173/92  Pulse: 95 87  Resp: 18   Temp: 98.6 F (37 C)   SpO2: 95%     Nursing notes and vital signs reviewed.  General: Adult male, laying in bed, awake, reactive Head: Atraumatic Chest: Symmetric chest rise, no tenderness to palpation.  Cardiac: Regular rhythm and rate.  Respiratory: Lungs clear to auscultation Abdomen: Soft, nondistended. No tenderness to palpation.  Pelvis: Stable in AP and lateral compression. No tenderness to  palpation. MSK: No deformity to bilateral upper and lower extremity. Full range of motion to bilateral upper lower extremity with no pain. Back: Tenderness to palpation over the lower cervical and throughout the thoracic region, no lumbar tenderness Neuro: Alert, oriented. GCS 15. 5 out of 5 strength in bilateral upper and lower extremities. Normal sensation to light touch in bilateral upper and lower extremity. Skin: No evidence of burns or lacerations. ED Results / Procedures / Treatments   Labs (all labs ordered are listed, but only abnormal results are displayed) Labs Reviewed - No data to display   EKG EKG independently reviewed interpreted by myself (ER attending) demonstrates:    RADIOLOGY Imaging independently reviewed and interpreted by myself demonstrates:  CT C-spine without acute fracture CT T-spine without acute fracture  PROCEDURES:  Critical Care performed: No  Procedures   MEDICATIONS ORDERED IN ED: Medications  lidocaine (LIDODERM) 5 % 1 patch (1 patch Transdermal Patch Applied 03/23/23 0742)  oxyCODONE-acetaminophen (PERCOCET/ROXICET) 5-325 MG per tablet 1 tablet (1 tablet Oral Given 03/23/23 0741)  ketorolac (TORADOL) 15 MG/ML injection 15 mg (15  mg Intramuscular Given 03/23/23 0741)     IMPRESSION / MDM / ASSESSMENT AND PLAN / ED COURSE  I reviewed the triage vital signs and the nursing notes.  Differential diagnosis includes, but is not limited to, fracture, soft tissue injury, muscle strain  Patient's presentation is most consistent with acute presentation with potential threat to life or bodily function.  78 year old male presenting with upper back pain after fall, no focal neurologic findings on exam.  Will treat with multimodal pain control and obtain CTs to further evaluate.  Clinical Course as of 03/23/23 1610  Wed Mar 23, 2023  0901 CT is resulted without acute traumatic injuries.  Patient reassessed.  Reports significant improvement in pain.   He is comfortable with discharge home.  Do think this is reasonable.  Will DC with short course of pain medication in the setting of acute trauma.  Strict return precautions provided. [NR]    Clinical Course User Index [NR] Trinna Post, MD     FINAL CLINICAL IMPRESSION(S) / ED DIAGNOSES   Final diagnoses:  Upper back pain     Rx / DC Orders   ED Discharge Orders          Ordered    HYDROcodone-acetaminophen (NORCO/VICODIN) 5-325 MG tablet  Every 6 hours PRN        03/23/23 0902             Note:  This document was prepared using Dragon voice recognition software and may include unintentional dictation errors.   Trinna Post, MD 03/23/23 [phone]

## 2023-03-23 NOTE — ED Triage Notes (Signed)
States he fell 2 weeks ago after having L4/L5  surgery hitting the middle of his back. States since the fall the pain has been nagging and intense to middle of his back

## 2023-03-23 NOTE — ED Notes (Signed)
 Patient transported to CT

## 2023-03-28 ENCOUNTER — Telehealth: Payer: Self-pay | Admitting: Orthopedic Surgery

## 2023-03-28 NOTE — Telephone Encounter (Signed)
-----   Message from [place] B sent at 03/28/2023 11:34 AM EST ----- Regarding: referral Hey, I just wanted to inform you that I've been working on his pain clinic approval from the [place] for a while now but they are making him complete a tox screening drug test in order to be approved. The patient is not sure why he has to do this but he will call his PCP at the [place] to have that completed so he can be seen for the injections. He also says he's not doing so well, in a lot of pain and hard to move around.   He will keep [place] updated!

## 2023-03-29 ENCOUNTER — Encounter: Payer: No Typology Code available for payment source | Admitting: Physical Therapy

## 2023-03-31 ENCOUNTER — Encounter: Payer: No Typology Code available for payment source | Admitting: Physical Therapy

## 2023-04-05 ENCOUNTER — Encounter: Payer: No Typology Code available for payment source | Admitting: Physical Therapy

## 2023-04-07 ENCOUNTER — Encounter: Payer: No Typology Code available for payment source | Admitting: Physical Therapy

## 2023-04-12 ENCOUNTER — Encounter: Payer: No Typology Code available for payment source | Admitting: Physical Therapy

## 2023-04-14 ENCOUNTER — Encounter: Payer: No Typology Code available for payment source | Admitting: Physical Therapy

## 2023-04-19 ENCOUNTER — Encounter: Payer: No Typology Code available for payment source | Admitting: Physical Therapy

## 2023-04-21 ENCOUNTER — Encounter: Payer: No Typology Code available for payment source | Admitting: Physical Therapy

## 2023-04-26 ENCOUNTER — Encounter: Payer: No Typology Code available for payment source | Admitting: Physical Therapy

## 2023-04-28 ENCOUNTER — Encounter: Payer: No Typology Code available for payment source | Admitting: Physical Therapy

## 2023-05-03 ENCOUNTER — Ambulatory Visit: Payer: No Typology Code available for payment source | Admitting: Orthopedic Surgery

## 2023-05-18 ENCOUNTER — Telehealth: Payer: Self-pay | Admitting: Orthopedic Surgery

## 2023-05-18 NOTE — Telephone Encounter (Signed)
-----   Message from [place] B sent at 05/18/2023  9:42 AM EDT ----- Regarding: Referral Good morning, I just wanted to inform you that I spoke with his wife and his A1C levels are high and the patient is doing good right now. They just want to hold off on all appointments/referrals so I just wanted to send an update!

## 2023-09-29 ENCOUNTER — Inpatient Hospital Stay

## 2023-09-29 ENCOUNTER — Emergency Department

## 2023-09-29 ENCOUNTER — Other Ambulatory Visit: Payer: Self-pay

## 2023-09-29 ENCOUNTER — Inpatient Hospital Stay
Admission: EM | Admit: 2023-09-29 | Discharge: 2023-10-14 | DRG: 871 | Disposition: A | Discharge status: Home Health | Attending: Obstetrics and Gynecology | Admitting: Obstetrics and Gynecology

## 2023-09-29 DIAGNOSIS — D72819 Decreased white blood cell count, unspecified: Secondary | ICD-10-CM | POA: Diagnosis present

## 2023-09-29 DIAGNOSIS — Z8546 Personal history of malignant neoplasm of prostate: Secondary | ICD-10-CM

## 2023-09-29 DIAGNOSIS — K219 Gastro-esophageal reflux disease without esophagitis: Secondary | ICD-10-CM | POA: Diagnosis present

## 2023-09-29 DIAGNOSIS — I2510 Atherosclerotic heart disease of native coronary artery without angina pectoris: Secondary | ICD-10-CM | POA: Diagnosis present

## 2023-09-29 DIAGNOSIS — Z751 Person awaiting admission to adequate facility elsewhere: Secondary | ICD-10-CM

## 2023-09-29 DIAGNOSIS — Z7189 Other specified counseling: Secondary | ICD-10-CM | POA: Diagnosis not present

## 2023-09-29 DIAGNOSIS — R45851 Suicidal ideations: Secondary | ICD-10-CM | POA: Diagnosis present

## 2023-09-29 DIAGNOSIS — N179 Acute kidney failure, unspecified: Secondary | ICD-10-CM | POA: Diagnosis not present

## 2023-09-29 DIAGNOSIS — E1142 Type 2 diabetes mellitus with diabetic polyneuropathy: Secondary | ICD-10-CM | POA: Diagnosis present

## 2023-09-29 DIAGNOSIS — E16A3 Hypoglycemia level 3: Secondary | ICD-10-CM | POA: Diagnosis not present

## 2023-09-29 DIAGNOSIS — G9341 Metabolic encephalopathy: Secondary | ICD-10-CM | POA: Diagnosis present

## 2023-09-29 DIAGNOSIS — F32A Depression, unspecified: Secondary | ICD-10-CM | POA: Insufficient documentation

## 2023-09-29 DIAGNOSIS — Z951 Presence of aortocoronary bypass graft: Secondary | ICD-10-CM | POA: Diagnosis not present

## 2023-09-29 DIAGNOSIS — A419 Sepsis, unspecified organism: Principal | ICD-10-CM | POA: Diagnosis present

## 2023-09-29 DIAGNOSIS — I10 Essential (primary) hypertension: Secondary | ICD-10-CM | POA: Diagnosis not present

## 2023-09-29 DIAGNOSIS — Z833 Family history of diabetes mellitus: Secondary | ICD-10-CM

## 2023-09-29 DIAGNOSIS — Z794 Long term (current) use of insulin: Secondary | ICD-10-CM | POA: Diagnosis not present

## 2023-09-29 DIAGNOSIS — E870 Hyperosmolality and hypernatremia: Secondary | ICD-10-CM | POA: Diagnosis present

## 2023-09-29 DIAGNOSIS — E782 Mixed hyperlipidemia: Secondary | ICD-10-CM | POA: Diagnosis present

## 2023-09-29 DIAGNOSIS — R54 Age-related physical debility: Secondary | ICD-10-CM | POA: Diagnosis present

## 2023-09-29 DIAGNOSIS — I5032 Chronic diastolic (congestive) heart failure: Secondary | ICD-10-CM | POA: Diagnosis present

## 2023-09-29 DIAGNOSIS — Z789 Other specified health status: Secondary | ICD-10-CM | POA: Diagnosis not present

## 2023-09-29 DIAGNOSIS — F331 Major depressive disorder, recurrent, moderate: Secondary | ICD-10-CM | POA: Diagnosis present

## 2023-09-29 DIAGNOSIS — J9601 Acute respiratory failure with hypoxia: Principal | ICD-10-CM | POA: Diagnosis present

## 2023-09-29 DIAGNOSIS — E876 Hypokalemia: Secondary | ICD-10-CM | POA: Diagnosis present

## 2023-09-29 DIAGNOSIS — Z7984 Long term (current) use of oral hypoglycemic drugs: Secondary | ICD-10-CM

## 2023-09-29 DIAGNOSIS — N400 Enlarged prostate without lower urinary tract symptoms: Secondary | ICD-10-CM | POA: Diagnosis present

## 2023-09-29 DIAGNOSIS — Z823 Family history of stroke: Secondary | ICD-10-CM

## 2023-09-29 DIAGNOSIS — Z8249 Family history of ischemic heart disease and other diseases of the circulatory system: Secondary | ICD-10-CM

## 2023-09-29 DIAGNOSIS — E1165 Type 2 diabetes mellitus with hyperglycemia: Secondary | ICD-10-CM | POA: Diagnosis present

## 2023-09-29 DIAGNOSIS — E11649 Type 2 diabetes mellitus with hypoglycemia without coma: Secondary | ICD-10-CM | POA: Diagnosis not present

## 2023-09-29 DIAGNOSIS — R410 Disorientation, unspecified: Secondary | ICD-10-CM

## 2023-09-29 DIAGNOSIS — Z7401 Bed confinement status: Secondary | ICD-10-CM | POA: Diagnosis not present

## 2023-09-29 DIAGNOSIS — J181 Lobar pneumonia, unspecified organism: Secondary | ICD-10-CM | POA: Diagnosis present

## 2023-09-29 DIAGNOSIS — Z7982 Long term (current) use of aspirin: Secondary | ICD-10-CM

## 2023-09-29 DIAGNOSIS — Z515 Encounter for palliative care: Secondary | ICD-10-CM | POA: Diagnosis not present

## 2023-09-29 DIAGNOSIS — R6520 Severe sepsis without septic shock: Secondary | ICD-10-CM | POA: Diagnosis present

## 2023-09-29 DIAGNOSIS — Z79899 Other long term (current) drug therapy: Secondary | ICD-10-CM

## 2023-09-29 DIAGNOSIS — C61 Malignant neoplasm of prostate: Secondary | ICD-10-CM | POA: Diagnosis present

## 2023-09-29 DIAGNOSIS — E1129 Type 2 diabetes mellitus with other diabetic kidney complication: Secondary | ICD-10-CM | POA: Diagnosis present

## 2023-09-29 DIAGNOSIS — F3289 Other specified depressive episodes: Secondary | ICD-10-CM | POA: Diagnosis not present

## 2023-09-29 DIAGNOSIS — I110 Hypertensive heart disease with heart failure: Secondary | ICD-10-CM | POA: Diagnosis present

## 2023-09-29 DIAGNOSIS — J189 Pneumonia, unspecified organism: Principal | ICD-10-CM

## 2023-09-29 DIAGNOSIS — Z955 Presence of coronary angioplasty implant and graft: Secondary | ICD-10-CM

## 2023-09-29 LAB — CBG MONITORING, ED
Glucose-Capillary: 175 mg/dL — ABNORMAL HIGH (ref 70–99)
Glucose-Capillary: 449 mg/dL — ABNORMAL HIGH (ref 70–99)

## 2023-09-29 LAB — CBC WITH DIFFERENTIAL/PLATELET
Abs Immature Granulocytes: 0 K/uL (ref 0.00–0.07)
Basophils Absolute: 0 K/uL (ref 0.0–0.1)
Basophils Relative: 0 %
Eosinophils Absolute: 0 K/uL (ref 0.0–0.5)
Eosinophils Relative: 0 %
HCT: 44.1 % (ref 39.0–52.0)
Hemoglobin: 14.5 g/dL (ref 13.0–17.0)
Immature Granulocytes: 0 %
Lymphocytes Relative: 14 %
Lymphs Abs: 0.4 K/uL — ABNORMAL LOW (ref 0.7–4.0)
MCH: 28.8 pg (ref 26.0–34.0)
MCHC: 32.9 g/dL (ref 30.0–36.0)
MCV: 87.7 fL (ref 80.0–100.0)
Monocytes Absolute: 0.2 K/uL (ref 0.1–1.0)
Monocytes Relative: 7 %
Neutro Abs: 2.2 K/uL (ref 1.7–7.7)
Neutrophils Relative %: 79 %
Platelets: 244 K/uL (ref 150–400)
RBC: 5.03 MIL/uL (ref 4.22–5.81)
RDW: 14.1 % (ref 11.5–15.5)
WBC: 2.9 K/uL — ABNORMAL LOW (ref 4.0–10.5)
nRBC: 0 % (ref 0.0–0.2)

## 2023-09-29 LAB — RESP PANEL BY RT-PCR (RSV, FLU A&B, COVID)  RVPGX2
Influenza A by PCR: NEGATIVE
Influenza B by PCR: NEGATIVE
Resp Syncytial Virus by PCR: NEGATIVE
SARS Coronavirus 2 by RT PCR: NEGATIVE

## 2023-09-29 LAB — COMPREHENSIVE METABOLIC PANEL WITH GFR
ALT: 25 U/L (ref 0–44)
AST: 36 U/L (ref 15–41)
Albumin: 4 g/dL (ref 3.5–5.0)
Alkaline Phosphatase: 64 U/L (ref 38–126)
Anion gap: 12 (ref 5–15)
BUN: 13 mg/dL (ref 8–23)
CO2: 23 mmol/L (ref 22–32)
Calcium: 8.9 mg/dL (ref 8.9–10.3)
Chloride: 111 mmol/L (ref 98–111)
Creatinine, Ser: 1.14 mg/dL (ref 0.61–1.24)
GFR, Estimated: >60 mL/min (ref >60)
Glucose, Bld: 64 mg/dL — ABNORMAL LOW (ref 70–99)
Potassium: 3 mmol/L — ABNORMAL LOW (ref 3.5–5.1)
Sodium: 146 mmol/L — ABNORMAL HIGH (ref 135–145)
Total Bilirubin: 1.7 mg/dL — ABNORMAL HIGH (ref 0.0–1.2)
Total Protein: 7.3 g/dL (ref 6.5–8.1)

## 2023-09-29 LAB — URINALYSIS, W/ REFLEX TO CULTURE (INFECTION SUSPECTED)
Bilirubin Urine: NEGATIVE
Glucose, UA: 50 mg/dL — AB
Ketones, ur: 5 mg/dL — AB
Leukocytes,Ua: NEGATIVE
Nitrite: NEGATIVE
Protein, ur: >=300 mg/dL — AB
Specific Gravity, Urine: 1.016 (ref 1.005–1.030)
pH: 5 (ref 5.0–8.0)

## 2023-09-29 LAB — LACTIC ACID, PLASMA
Lactic Acid, Venous: 1.6 mmol/L (ref 0.5–1.9)
Lactic Acid, Venous: 1.7 mmol/L (ref 0.5–1.9)

## 2023-09-29 LAB — GLUCOSE, CAPILLARY
Glucose-Capillary: 133 mg/dL — ABNORMAL HIGH (ref 70–99)
Glucose-Capillary: 138 mg/dL — ABNORMAL HIGH (ref 70–99)

## 2023-09-29 LAB — TYPE AND SCREEN
ABO/RH(D): O POS
Antibody Screen: NEGATIVE

## 2023-09-29 LAB — STREP PNEUMONIAE URINARY ANTIGEN: Strep Pneumo Urinary Antigen: NEGATIVE

## 2023-09-29 LAB — PROTIME-INR
INR: 1.1 (ref 0.8–1.2)
Prothrombin Time: 14.5 s (ref 11.4–15.2)

## 2023-09-29 MED ORDER — ONDANSETRON HCL 4 MG PO TABS: 4.0000 mg | ORAL_TABLET | Freq: Four times a day (QID) | ORAL | Status: DC | PRN

## 2023-09-29 MED ORDER — INSULIN ASPART 100 UNIT/ML IJ SOLN
0.0000 [IU] | Freq: Three times a day (TID) | INTRAMUSCULAR | Status: DC
  Administered 2023-09-30: 1 [IU] via SUBCUTANEOUS
  Administered 2023-09-30: 2 [IU] via SUBCUTANEOUS
  Administered 2023-10-01 – 2023-10-02 (×3): 1 [IU] via SUBCUTANEOUS
  Administered 2023-10-02: 5 [IU] via SUBCUTANEOUS
  Administered 2023-10-02: 6 [IU] via SUBCUTANEOUS
  Administered 2023-10-03: 5 [IU] via SUBCUTANEOUS
  Administered 2023-10-03: 3 [IU] via SUBCUTANEOUS
  Filled 2023-09-29 (×8): qty 1

## 2023-09-29 MED ORDER — INSULIN ASPART 100 UNIT/ML IJ SOLN
0.0000 [IU] | Freq: Every day | INTRAMUSCULAR | Status: DC
  Administered 2023-10-02: 5 [IU] via SUBCUTANEOUS
  Administered 2023-10-03: 2 [IU] via SUBCUTANEOUS
  Administered 2023-10-04: 4 [IU] via SUBCUTANEOUS
  Administered 2023-10-05: 3 [IU] via SUBCUTANEOUS
  Administered 2023-10-08: 2 [IU] via SUBCUTANEOUS
  Administered 2023-10-09: 3 [IU] via SUBCUTANEOUS
  Administered 2023-10-10 – 2023-10-12 (×3): 2 [IU] via SUBCUTANEOUS
  Filled 2023-09-29 (×9): qty 1

## 2023-09-29 MED ORDER — FUROSEMIDE 10 MG/ML IJ SOLN
20.0000 mg | Freq: Once | INTRAMUSCULAR | Status: AC
  Administered 2023-09-29: 20 mg via INTRAVENOUS
  Filled 2023-09-29: qty 4

## 2023-09-29 MED ORDER — DEXTROSE 50 % IV SOLN
25.0000 g | Freq: Once | INTRAVENOUS | Status: AC
  Administered 2023-09-29: 25 g via INTRAVENOUS
  Filled 2023-09-29: qty 50

## 2023-09-29 MED ORDER — LIDOCAINE 5 % EX PTCH
1.0000 | MEDICATED_PATCH | CUTANEOUS | Status: DC
  Administered 2023-09-30 – 2023-10-14 (×14): 1 via TRANSDERMAL
  Filled 2023-09-29 (×15): qty 1

## 2023-09-29 MED ORDER — VANCOMYCIN HCL 1750 MG/350ML IV SOLN
1750.0000 mg | INTRAVENOUS | Status: DC
  Administered 2023-09-29: 1750 mg via INTRAVENOUS
  Filled 2023-09-29 (×2): qty 350

## 2023-09-29 MED ORDER — LACTATED RINGERS IV BOLUS (SEPSIS)
1000.0000 mL | Freq: Once | INTRAVENOUS | Status: AC
  Administered 2023-09-29: 1000 mL via INTRAVENOUS

## 2023-09-29 MED ORDER — ENOXAPARIN SODIUM 40 MG/0.4ML IJ SOSY: 40.0000 mg | PREFILLED_SYRINGE | INTRAMUSCULAR | Status: DC

## 2023-09-29 MED ORDER — ACETAMINOPHEN 325 MG PO TABS: 650.0000 mg | ORAL_TABLET | Freq: Four times a day (QID) | ORAL | Status: DC | PRN

## 2023-09-29 MED ORDER — ACETAMINOPHEN 500 MG PO TABS: 1000.0000 mg | ORAL_TABLET | Freq: Once | ORAL | Status: DC

## 2023-09-29 MED ORDER — ONDANSETRON HCL 4 MG/2ML IJ SOLN: 4.0000 mg | Freq: Four times a day (QID) | INTRAMUSCULAR | Status: DC | PRN

## 2023-09-29 MED ORDER — LACTATED RINGERS IV SOLN: INTRAVENOUS | Status: DC

## 2023-09-29 MED ORDER — CARVEDILOL 6.25 MG PO TABS
12.5000 mg | ORAL_TABLET | Freq: Two times a day (BID) | ORAL | Status: DC
  Administered 2023-09-29 – 2023-10-14 (×29): 12.5 mg via ORAL
  Filled 2023-09-29 (×2): qty 1
  Filled 2023-09-29: qty 2
  Filled 2023-09-29 (×2): qty 1
  Filled 2023-09-29: qty 2
  Filled 2023-09-29 (×5): qty 1
  Filled 2023-09-29: qty 2
  Filled 2023-09-29 (×4): qty 1
  Filled 2023-09-29 (×2): qty 2
  Filled 2023-09-29: qty 1
  Filled 2023-09-29 (×3): qty 2
  Filled 2023-09-29 (×7): qty 1

## 2023-09-29 MED ORDER — SODIUM CHLORIDE 0.9 % IV SOLN: 100.0000 mg | Freq: Two times a day (BID) | INTRAVENOUS | Status: DC

## 2023-09-29 MED ORDER — SODIUM CHLORIDE 0.9 % IV SOLN
2.0000 g | Freq: Once | INTRAVENOUS | Status: AC
  Administered 2023-09-29: 2 g via INTRAVENOUS
  Filled 2023-09-29: qty 12.5

## 2023-09-29 MED ORDER — SODIUM CHLORIDE 0.9 % IV SOLN
500.0000 mg | INTRAVENOUS | Status: DC
  Administered 2023-09-29 – 2023-10-01 (×3): 500 mg via INTRAVENOUS
  Filled 2023-09-29 (×4): qty 5

## 2023-09-29 MED ORDER — ACETAMINOPHEN 650 MG RE SUPP: 650.0000 mg | Freq: Four times a day (QID) | RECTAL | Status: DC | PRN

## 2023-09-29 MED ORDER — VANCOMYCIN HCL IN DEXTROSE 1-5 GM/200ML-% IV SOLN
1000.0000 mg | Freq: Once | INTRAVENOUS | Status: AC
  Administered 2023-09-29: 1000 mg via INTRAVENOUS
  Filled 2023-09-29: qty 200

## 2023-09-29 MED ORDER — TAMSULOSIN HCL 0.4 MG PO CAPS
0.4000 mg | ORAL_CAPSULE | Freq: Every day | ORAL | Status: DC
  Administered 2023-09-29 – 2023-10-13 (×15): 0.4 mg via ORAL
  Filled 2023-09-29 (×15): qty 1

## 2023-09-29 MED ORDER — SODIUM CHLORIDE 0.9 % IV SOLN
2.0000 g | INTRAVENOUS | Status: DC
  Administered 2023-09-29: 2 g via INTRAVENOUS
  Filled 2023-09-29: qty 20

## 2023-09-29 MED ORDER — LOSARTAN POTASSIUM 50 MG PO TABS: 100.0000 mg | ORAL_TABLET | Freq: Every day | ORAL | Status: DC

## 2023-09-29 MED ORDER — METRONIDAZOLE 500 MG/100ML IV SOLN
500.0000 mg | Freq: Once | INTRAVENOUS | Status: AC
  Administered 2023-09-29: 500 mg via INTRAVENOUS
  Filled 2023-09-29: qty 100

## 2023-09-29 MED ORDER — ONDANSETRON HCL 4 MG/2ML IJ SOLN
4.0000 mg | Freq: Once | INTRAMUSCULAR | Status: AC
  Administered 2023-09-29: 4 mg via INTRAVENOUS
  Filled 2023-09-29: qty 2

## 2023-09-29 MED ORDER — METRONIDAZOLE 500 MG/100ML IV SOLN: 500.0000 mg | Freq: Two times a day (BID) | INTRAVENOUS | Status: DC

## 2023-09-29 MED ORDER — ISOSORBIDE MONONITRATE ER 60 MG PO TB24: 30.0000 mg | ORAL_TABLET | Freq: Every day | ORAL | Status: DC

## 2023-09-29 MED ORDER — SODIUM CHLORIDE 0.9 % IV SOLN
2.0000 g | Freq: Two times a day (BID) | INTRAVENOUS | Status: DC
  Administered 2023-09-29 – 2023-09-30 (×3): 2 g via INTRAVENOUS
  Filled 2023-09-29 (×4): qty 12.5

## 2023-09-29 MED ORDER — POTASSIUM CHLORIDE 10 MEQ/100ML IV SOLN
10.0000 meq | INTRAVENOUS | Status: AC
  Administered 2023-09-29 (×3): 10 meq via INTRAVENOUS
  Filled 2023-09-29 (×3): qty 100

## 2023-09-29 MED ORDER — PANTOPRAZOLE SODIUM 40 MG IV SOLR
40.0000 mg | Freq: Every day | INTRAVENOUS | Status: DC
  Administered 2023-09-29 – 2023-10-01 (×3): 40 mg via INTRAVENOUS
  Filled 2023-09-29 (×3): qty 10

## 2023-09-29 MED ORDER — ASPIRIN 81 MG PO TBEC
81.0000 mg | DELAYED_RELEASE_TABLET | Freq: Every day | ORAL | Status: DC
  Administered 2023-09-29 – 2023-10-14 (×16): 81 mg via ORAL
  Filled 2023-09-29 (×16): qty 1

## 2023-09-29 MED ORDER — ROSUVASTATIN CALCIUM 20 MG PO TABS
20.0000 mg | ORAL_TABLET | Freq: Every day | ORAL | Status: DC
  Administered 2023-09-30 – 2023-10-14 (×15): 20 mg via ORAL
  Filled 2023-09-29: qty 1
  Filled 2023-09-29 (×4): qty 2
  Filled 2023-09-29: qty 1
  Filled 2023-09-29 (×4): qty 2
  Filled 2023-09-29: qty 1
  Filled 2023-09-29 (×3): qty 2
  Filled 2023-09-29: qty 1

## 2023-09-29 MED ORDER — INSULIN GLARGINE 100 UNIT/ML ~~LOC~~ SOLN
20.0000 [IU] | Freq: Every day | SUBCUTANEOUS | Status: DC
  Administered 2023-09-29: 20 [IU] via SUBCUTANEOUS
  Filled 2023-09-29: qty 0.2

## 2023-09-29 MED ORDER — POLYETHYLENE GLYCOL 3350 17 G PO PACK: 17.0000 g | PACK | Freq: Every day | ORAL | Status: DC | PRN

## 2023-09-29 MED ORDER — FUROSEMIDE 10 MG/ML IJ SOLN
20.0000 mg | Freq: Two times a day (BID) | INTRAMUSCULAR | Status: DC
  Administered 2023-09-30: 20 mg via INTRAVENOUS
  Filled 2023-09-29: qty 2

## 2023-09-29 NOTE — ED Provider Notes (Signed)
 [facility] Provider Note    Event Date/Time   First MD Initiated Contact with Patient 09/29/23 [phone]     (approximate)  History   Chief Complaint: Code Sepsis  HPI  Cristian Martin is a 78 y.o. male with a past medical history of diabetes, CAD with stents, CHF, gastric reflux, hypertension, hyperlipidemia, presents to the emergency department for altered mental status fever nausea and vomiting.  According to EMS they were called out by the patient's wife who last saw the patient normal last night.  This morning she found the patient to be very somnolent and less responsive was covered in vomitus which presumably happened overnight.  EMS states room air saturation of 82% with no baseline O2 requirement, placed the patient on nonrebreather found to have a 102.6 temperature.  Transported to the emergency department for further evaluation.  Here patient is awake and alert he is able to tell me his name but is unable to tell me where he is at or the date.  Physical Exam   Triage Vital Signs: ED Triage Vitals [09/29/23 0912]  Encounter Vitals Group     BP      Girls Systolic BP Percentile      Girls Diastolic BP Percentile      Boys Systolic BP Percentile      Boys Diastolic BP Percentile      Pulse      Resp      Temp      Temp src      SpO2      Weight 195 lb (88.5 kg)     Height 5' 11 (1.803 m)     Head Circumference      Peak Flow      Pain Score 0     Pain Loc      Pain Education      Exclude from Growth Chart     Most recent vital signs: There were no vitals filed for this visit.  General: Awake, no distress.  Oriented to person only.  Able answer some questions. CV:  Good peripheral perfusion.  Regular rate and rhythm  Resp:  Normal effort.  Equal breath sounds bilaterally.  No obvious rales or rhonchi Abd:  No distention.  Soft, nontender.  No rebound or guarding. Other:  Dried vomitus on his face.   ED Results / Procedures / Treatments    EKG  EKG viewed and interpreted by myself shows sinus tachycardia at 121 bpm with a narrow QRS, normal axis, normal intervals, no concerning ST changes.  RADIOLOGY  I reviewed interpreted chest x-ray images.  Patient appears to have opacities in the right midlung. Radiology has read the x-ray as right perihilar opacities concerning for pneumonia.   MEDICATIONS ORDERED IN ED: Medications  lactated ringers  infusion (has no administration in time range)  lactated ringers  bolus 1,000 mL (has no administration in time range)  ceFEPIme  (MAXIPIME ) 2 g in sodium chloride  0.9 % 100 mL IVPB (has no administration in time range)  metroNIDAZOLE  (FLAGYL ) IVPB 500 mg (has no administration in time range)  vancomycin  (VANCOCIN ) IVPB 1000 mg/200 mL premix (has no administration in time range)     IMPRESSION / MDM / ASSESSMENT AND PLAN / ED COURSE  I reviewed the triage vital signs and the nursing notes.  Patient's presentation is most consistent with acute presentation with potential threat to life or bodily function.  Patient presents to the emergency department for confusion found to be  febrile and hypoxic.  Patient covered in dried vomitus, concerning for aspiration in addition to other infectious sources.  Given the patient's hypoxia and fever meeting sepsis criteria we will start the patient on broad-spectrum IV antibiotics.  We will dose IV fluids.  Will check labs, cultures, chest x-ray and EKG and continue to closely monitor.  Given the patient's hypoxia fevers/sepsis with confusion patient will require admission once his emergency department workup has been completed.  Patient's lab work has resulted showing mild leukopenia with a white blood cell count of 2.9 otherwise reassuring CBC.  Patient's chemistry shows no significant findings besides a borderline low blood glucose patient was given dextrose  in the emergency department.  Lactic acid of 1.6.  Respiratory panel negative.  Patient  currently on humidified high flow nasal cannula oxygen satting in the upper 90s.  CRITICAL CARE Performed by: Franky Moores   Total critical care time: 30 minutes  Critical care time was exclusive of separately billable procedures and treating other patients.  Critical care was necessary to treat or prevent imminent or life-threatening deterioration.  Critical care was time spent personally by me on the following activities: development of treatment plan with patient and/or surrogate as well as nursing, discussions with consultants, evaluation of patient's response to treatment, examination of patient, obtaining history from patient or surrogate, ordering and performing treatments and interventions, ordering and review of laboratory studies, ordering and review of radiographic studies, pulse oximetry and re-evaluation of patient's condition.   FINAL CLINICAL IMPRESSION(S) / ED DIAGNOSES   Sepsis Pneumonia Questionable aspiration  Note:  This document was prepared using Dragon voice recognition software and may include unintentional dictation errors.   Moores Franky, MD 09/29/23 1243

## 2023-09-29 NOTE — ED Notes (Signed)
 Floor not ready to get report at this time.

## 2023-09-29 NOTE — Consult Note (Signed)
 Pharmacy Antibiotic Note  Cristian Martin is a 78 y.o. male admitted on 09/29/2023 with pneumonia.  Pharmacy has been consulted for vancomycin  and cefepime  dosing. Patient is also ordered doxycycline and metronidazole .  Plan:  Cefepime  2 g IV q12h  Vancomycin  1.75 g IV q24h --Calculated AUC: 532.2, Cmin: 12.4 --Scr 1.14, IBW, Vd 0.72 --Daily Scr per protocol, levels at steady state or as clinically indicated  Height: 5' 11 (180.3 cm) Weight: 88.5 kg (195 lb) IBW/kg (Calculated) : 75.3  Temp (24hrs), Avg:99.6 F (37.6 C), Min:98.2 F (36.8 C), Max:101.6 F (38.7 C)  Recent Labs  Lab 09/29/23 0918 09/29/23 1131  WBC 2.9*  --   CREATININE 1.14  --   LATICACIDVEN 1.6 1.7    Estimated Creatinine Clearance: 56.9 mL/min (by C-G formula based on SCr of 1.14 mg/dL).    Allergies  Allergen Reactions   Ace Inhibitors Other (See Comments)    Hyperkalemia.    Antimicrobials this admission: Azithromycin  8/28 >>  Cefepime  8/28 >>  Ceftriaxone  8/28 x 1 Metronidazole  8/28 x 1 Vancomycin  8/28 >>   Dose adjustments this admission: N/A  Microbiology results: 8/28 BCx: pending 8/28 Sputum: pending  8/28 MRSA PCR: pending  Thank you for allowing pharmacy to be a part of this patient's care.  Cristian Martin 09/29/2023 3:50 PM

## 2023-09-29 NOTE — ED Notes (Signed)
Patient still has not voided.

## 2023-09-29 NOTE — H&P (Signed)
 History and Physical    MIKELL KAZLAUSKAS FMW:[id number] DOB: August 04, 1945 DOA: 09/29/2023  DOS: the patient was seen and examined on 09/29/2023  [facility]   Patient coming from: Home  I have personally briefly reviewed patient's old medical records in [facility]  Chief Complaint: Altered mental status, fever, nausea, vomiting  HPI: BUDDY LOEFFELHOLZ is a pleasant 78 y.o. male with medical history significant for CAD s/p CABG HFpEF, HLD, GERD, HTN, DM on insulin , anxiety who presented to ED after altered mental status, fever, nausea and vomiting.  EMS was called out by the patient's wife who last saw the patient normal last night.  This morning she found the patient to be very somnolent, less responsive, covered with vomitus which presumably happened overnight.  EMS states roommate air saturation was 82% with no baseline oxygen requirement, placed the patient on nonrebreather found to have a 102.6 F temperature, transported to emergency room for further evaluation. Patient was seen and examined at bedside in the emergency room.  Patient was alert awake and oriented.  He stated that he was okay while he went to the bed last night.  But he was not able to tell me what happened in the night and this morning.  He was able to tell me he is in the hospital and he was able to tell me the date, year and the month.  ED Course: Upon arrival to the ED, patient is found to be alert awake, was able to tell his name but unable to tell where is he and what is the date.  Chest x-ray showed right midlung opacity concerning for pneumonia, leukopenia at 2.9, hypoxemia with normal lactic acid at 1.6.  Respiratory panel were negative patient required humidified high flow oxygen via nasal cannula at 15 L in upper 90s saturation.  Patient was given vancomycin  metronidazole  and cefepime  and 1 L of lactated Ringer 's bolus and hospitalist service was consulted for evaluation for admission for possible pneumonia  and acute hypoxic respiratory failure  Review of Systems:  ROS  All other systems negative except as noted in the HPI.  Past Medical History:  Diagnosis Date   Agent orange exposure    Anxiety    Ascending aorta dilatation (HCC) 2022   a.) TTE 2022: Ao root 41 mm (per cardiology notes); b.) TTE 09/09/2021: asc Ao 38 mm   Chronic painful diabetic neuropathy (HCC)    Coronary artery disease    a.) LHC 04/27/2019: 99% dLM-oLAD, 50% o-pLCx, 70% pRCA, 80% p-mLAD, 70% D2 --> CVTS referral; b.) s/p 3v CABG 04/30/2019   DDD (degenerative disc disease), cervical    Diastolic dysfunction    a.) TTE 04/27/2019: EF 55-60%, mod conc LVH, G1DD. Nl RV fxn. Mild Ao sclerosis w/o stenosis   Erectile dysfunction    a.) on intracavernous alprostadil injections   GERD (gastroesophageal reflux disease)    HLD (hyperlipidemia)    Hx of colonic polyps    Hyperlipidemia    Hyperplasia of prostate with lower urinary tract symptoms (LUTS)    Hypertension    Insomnia    a.) takes melatonin PRN   Iron deficiency anemia    Long term current use of aspirin     Lumbar degenerative disc disease    Lumbar facet arthropathy    Osteoarthritis    Paresthesia of both feet    Prostate cancer (HCC) 02/21/2018   a.) stage T1c; Gleason 3+4; b.) s/p I-125 brachytherapy   Recurrent falls  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   a.) LIMA-LAD, SVG-OM1, SVG-RCA   Spinal stenosis, lumbar region with neurogenic claudication    Type 2 diabetes mellitus treated with insulin  (HCC)    Unstable angina (HCC)    Wears glasses    wears to help double vision   Wears partial dentures (upper)     Past Surgical History:  Procedure Laterality Date   COLONOSCOPY WITH PROPOFOL  N/A 08/16/2019   Procedure: COLONOSCOPY WITH PROPOFOL ;  Surgeon: Unk Corinn Skiff, MD;  Location: [facility] ENDOSCOPY;  Service: Gastroenterology;  Laterality: N/A;   CORONARY ARTERY BYPASS GRAFT N/A 04/30/2019   Procedure: CORONARY ARTERY BYPASS GRAFTING (CABG) TIMES  THREE USING LEFT INTERNAL MAMMARY AND RIGHT GREATER SAPHENOUS VEIN HARVESTED ENDOSCOPICALLY. MAMMARY ARTERY TO LAD, SAPHENOUS VEIN GRAFT TO  OM1, SAPHENOUS VEING GRAFT TO RIGHT. BIOPSY OF MEDIASTINAL TISSUE PLACEMENT OF RIGHT FEMORAL A-LINE;  Surgeon: Army Dallas NOVAK, MD;  Location: [facility] OR;  Service: Open Heart Surgery;  Laterality: N/A;   CYSTOSCOPY N/A 07/14/2018   Procedure: CYSTOSCOPY;  Surgeon: Ottelin, Mark, MD;  Location: [facility];  Service: Urology;  Laterality: N/A;  no seeds in bladder per Dr Ceil   ESOPHAGOGASTRODUODENOSCOPY (EGD) WITH PROPOFOL  N/A 08/16/2019   Procedure: ESOPHAGOGASTRODUODENOSCOPY (EGD) WITH PROPOFOL ;  Surgeon: Unk Corinn Skiff, MD;  Location: [facility] ENDOSCOPY;  Service: Gastroenterology;  Laterality: N/A;   LEFT HEART CATH AND CORONARY ANGIOGRAPHY N/A 04/27/2019   Procedure: LEFT HEART CATH AND CORONARY ANGIOGRAPHY;  Surgeon: Darron Deatrice LABOR, MD;  Location: [facility] INVASIVE CV LAB;  Service: Cardiovascular;  Laterality: N/A;   LUMBAR LAMINECTOMY/DECOMPRESSION MICRODISCECTOMY N/A 10/13/2022   Procedure: L4-5 DECOMPRESSION;  Surgeon: Clois Fret, MD;  Location: [facility] ORS;  Service: Neurosurgery;  Laterality: N/A;   PROSTATE BIOPSY  02-21-2018  dr ceil in office   RADIOACTIVE SEED IMPLANT N/A 07/14/2018   Procedure: RADIOACTIVE SEED IMPLANT/BRACHYTHERAPY IMPLANT;  Surgeon: Ottelin, Mark, MD;  Location: [facility];  Service: Urology;  Laterality: N/A;  77 seeds    SHOULDER ARTHROSCOPY Left 2018   in [place]   remove spur   SPACE OAR INSTILLATION N/A 07/14/2018   Procedure: SPACE OAR INSTILLATION;  Surgeon: Ottelin, Mark, MD;  Location: [facility];  Service: Urology;  Laterality: N/A;   TEE WITHOUT CARDIOVERSION N/A 04/30/2019   Procedure: TRANSESOPHAGEAL ECHOCARDIOGRAM (TEE);  Surgeon: Army Dallas NOVAK, MD;  Location: [facility] OR;  Service: Open Heart Surgery;  Laterality: N/A;     reports that he has never smoked.  He has never used smokeless tobacco. He reports that he does not currently use alcohol. He reports that he does not use drugs.  Allergies  Allergen Reactions   Ace Inhibitors Other (See Comments)    Hyperkalemia.    Family History  Problem Relation Age of Onset   Stroke Mother    Diabetes Father    Hypertension Father    Diabetes Sister    Cancer Neg Hx     Prior to Admission medications   Medication Sig Start Date End Date Taking? Authorizing Provider  carvedilol  (COREG ) 25 MG tablet Take 25 mg by mouth 2 (two) times daily with a meal.   Yes [provider]  Cholecalciferol 25 MCG (1000 UT) tablet Take 1,000 Units by mouth daily.   Yes [provider]  diclofenac Sodium (VOLTAREN) 1 % GEL Apply 2 g topically 2 (two) times daily. 02/17/23  Yes [provider]  empagliflozin (JARDIANCE) 25 MG TABS tablet Take 12.5 mg by mouth every  morning. 02/09/23  Yes [provider]  gabapentin  (NEURONTIN ) 600 MG tablet Take 1 tablet by mouth at bedtime. 11/30/19  Yes [provider]  insulin  aspart protamine - aspart (NOVOLOG  MIX 70/30) (70-30) 100 UNIT/ML injection Inject into the skin.  INJECT 60 UNITS OF SUBCUTANEOUSLY BEFORE BREAKFAST AND INJECT 50 UNITS BEFORE DINNER FOR DIABETES FOR DIABETES 04/25/23  Yes [provider]  lidocaine  (LIDODERM ) 5 % Place onto the skin.  APPLY 1 PATCH TO SKIN ONCE DAILY FOR BACK PAIN (APPLY FOR 12 HOURS, THEN REMOVE FOR 12 HOURS) 02/17/23  Yes [provider]  losartan  (COZAAR ) 100 MG tablet Take 100 mg by mouth daily.   Yes [provider]  melatonin 3 MG TABS tablet Take 3 mg by mouth at bedtime.   Yes [provider]  metFORMIN  (GLUCOPHAGE -XR) 500 MG 24 hr tablet Take 1,000 mg by mouth in the morning and at bedtime.   Yes [provider]  mirabegron ER (MYRBETRIQ) 25 MG TB24 tablet Take 25 mg by mouth daily. 06/29/23  Yes [provider]  Multiple Vitamins-Minerals  (MULTIVITAMIN ADULT PO) Take 1 tablet by mouth daily.    Yes [provider]  omeprazole (PRILOSEC) 20 MG capsule Take 20 mg by mouth 2 (two) times daily.    Yes [provider]  rosuvastatin  (CRESTOR ) 40 MG tablet Take 1 tablet (40 mg total) by mouth daily. Patient taking differently: Take 20 mg by mouth daily. 05/04/19  Yes Zimmerman, Donielle M, PA-C  Semaglutide , 2 MG/DOSE, 8 MG/3ML SOPN Inject into the skin.  INJECT 2MG  SUBCUTANEOUSLY EVERY 7 DAYS FOR DIABETES 07/04/23  Yes [provider]  tamsulosin  (FLOMAX ) 0.4 MG CAPS capsule Take 0.4 mg by mouth at bedtime.   Yes [provider]  aspirin  EC 81 MG tablet Take 1 tablet (81 mg total) by mouth daily. Swallow whole. 07/27/19   Vivienne Lonni Ingle, NP  DULoxetine  (CYMBALTA ) 60 MG capsule Take 60 mg by mouth daily. Patient not taking: Reported on 09/29/2023    [provider]  isosorbide  mononitrate (IMDUR ) 30 MG 24 hr tablet Take 30 mg by mouth daily.    [provider]  methocarbamol  (ROBAXIN ) 500 MG tablet Take 1 tablet (500 mg total) by mouth every 6 (six) hours as needed for muscle spasms. Patient not taking: Reported on 09/29/2023 10/14/22   Clois Fret, MD  oxybutynin (DITROPAN) 5 MG tablet Take 5 mg by mouth 3 (three) times daily. Patient not taking: Reported on 09/29/2023    [provider]    Physical Exam: Vitals:   09/29/23 1305 09/29/23 1330 09/29/23 1430 09/29/23 1500  BP:  119/70 (!) 110/98 99/86  Pulse:  (!) 103 (!) 106 (!) 107  Resp:  (!) 22 (!) 22 (!) 27  Temp: 98.2 F (36.8 C)     TempSrc: Oral     SpO2:  94% 95% 94%  Weight:      Height:        Physical Exam   Constitutional: Alert, awake, calm, comfortable HEENT: Neck supple Respiratory: Bilateral decreased air entry, coarse crackles on the right base Cardiovascular: Regular rate and rhythm, no murmurs / rubs / gallops. No extremity edema. 2+ pedal pulses. No carotid bruits.  Abdomen: Soft,  no tenderness, Bowel sounds positive.  Musculoskeletal: no clubbing / cyanosis. Good ROM, no contractures. Normal muscle tone.  Skin: no rashes, lesions, ulcers. Neurologic: CN 2-12 grossly intact. Sensation intact, No focal deficit identified Psychiatric: Alert and oriented x 3. Normal mood.  Labs on Admission: I have personally reviewed following labs and imaging studies  CBC: Recent Labs  Lab 09/29/23 0918  WBC 2.9*  NEUTROABS 2.2  HGB 14.5  HCT 44.1  MCV 87.7  PLT 244   Basic Metabolic Panel: Recent Labs  Lab 09/29/23 0918  NA 146*  K 3.0*  CL 111  CO2 23  GLUCOSE 64*  BUN 13  CREATININE 1.14  CALCIUM  8.9   GFR: Estimated Creatinine Clearance: 56.9 mL/min (by C-G formula based on SCr of 1.14 mg/dL). Liver Function Tests: Recent Labs  Lab 09/29/23 0918  AST 36  ALT 25  ALKPHOS 64  BILITOT 1.7*  PROT 7.3  ALBUMIN  4.0   No results for input(s): LIPASE, AMYLASE in the last 168 hours. No results for input(s): AMMONIA in the last 168 hours. Coagulation Profile: Recent Labs  Lab 09/29/23 0918  INR 1.1   Cardiac Enzymes: No results for input(s): CKTOTAL, CKMB, CKMBINDEX, TROPONINI, TROPONINIHS in the last 168 hours. BNP (last 3 results) No results for input(s): BNP in the last 8760 hours. HbA1C: No results for input(s): HGBA1C in the last 72 hours. CBG: Recent Labs  Lab 09/29/23 1036  GLUCAP 175*   Lipid Profile: No results for input(s): CHOL, HDL, LDLCALC, TRIG, CHOLHDL, LDLDIRECT in the last 72 hours. Thyroid  Function Tests: No results for input(s): TSH, T4TOTAL, FREET4, T3FREE, THYROIDAB in the last 72 hours. Anemia Panel: No results for input(s): VITAMINB12, FOLATE, FERRITIN, TIBC, IRON, RETICCTPCT in the last 72 hours. Urine analysis:    Component Value Date/Time   COLORURINE YELLOW (A) 09/29/2023 1253   APPEARANCEUR HAZY (A) 09/29/2023 1253   LABSPEC 1.016 09/29/2023 1253   PHURINE  5.0 09/29/2023 1253   GLUCOSEU 50 (A) 09/29/2023 1253   HGBUR SMALL (A) 09/29/2023 1253   BILIRUBINUR NEGATIVE 09/29/2023 1253   KETONESUR 5 (A) 09/29/2023 1253   PROTEINUR >=300 (A) 09/29/2023 1253   NITRITE NEGATIVE 09/29/2023 1253   LEUKOCYTESUR NEGATIVE 09/29/2023 1253    Radiological Exams on Admission: I have personally reviewed images DG Chest Port 1 View Result Date: 09/29/2023 CLINICAL DATA:  Sepsis. EXAM: PORTABLE CHEST 1 VIEW COMPARISON:  Jun 07, 2019. FINDINGS: Stable cardiomegaly. Status post coronary artery bypass graft. Left lung is clear. New right perihilar and basilar opacity is noted concerning for pneumonia. Bony thorax is unremarkable. IMPRESSION: New right perihilar and basilar opacity is noted concerning for pneumonia. Followup PA and lateral chest X-ray is recommended in 3-4 weeks following trial of antibiotic therapy to ensure resolution and exclude underlying malignancy. Electronically Signed   By: Lynwood Landy Raddle M.D.   On: 09/29/2023 10:12    EKG: My personal interpretation of EKG shows: Sinus tachycardia    Assessment/Plan Principal Problem:   Acute hypoxemic respiratory failure (HCC) Active Problems:   Benign essential hypertension   DM (diabetes mellitus) type II controlled with renal manifestation (HCC)   GERD (gastroesophageal reflux disease)   Hyperlipidemia, mixed   Malignant neoplasm of prostate (HCC)   S/P CABG x 3   Community acquired pneumonia    Assessment and Plan: 78 year old male with multiple medical problems including but not limited to coronary artery disease s/p CABG, prostate cancer, DM, HTN, HLD who was brought into ED at [facility] for acute change in mental status, hypoxemia and right-sided pneumonia.  1.  Acute hypoxemic respiratory failure likely due to pneumonia - He will be admitted to hospital in progressive care unit as inpatient for severe hypoxemia requiring 15 L high flow nasal cannula  oxygen - He received cefepime  vancomycin   and Flagyl  in the emergency room.  There is concern for aspiration. - I will continue those medication at this point - Will continue to give him high flow oxygen to maintain saturation more than 90%. - He will be closely monitored.  2.  Likely community-acquired pneumonia/aspiration - He has been placed in pneumonia protocol. - He will be continued on cefepime , vancomycin  and Flagyl  at this point.  I will also add doxycycline to cover for atypicals. - Will continue to follow the cultures. - Continue oxygen to maintain saturation more than 90%.  3.  History of congestive heart failure - I have not seen any diuretics used in this patient. - Will continue aspirin , Coreg , statin. - I will also give a dose of Lasix  one-time and start him on 20 mg IV twice daily. - Will continue to monitor his kidney function if they permit we will continue diuretics for the next 3 doses.  4.  HTN/HLD - Patient is on Crestor  and losartan  and Coreg . - Continue those medications - Continue to monitor blood pressure  5.  Diabetes - It appears that patient uses insulin  70/30. - I will give him Lantus  and insulin  sliding scale  6.  Hypokalemia - Will replace potassium - Check magnesium  level  7.  CAD - Continue aspirin , statin, beta-blocker, nitrate    DVT prophylaxis: Lovenox  Code Status: Full Code Family Communication: None available Disposition Plan: Home versus rehab Consults called: None Admission status: Inpatient, progressive care   Nena Rebel, MD [facility] 09/29/2023, 3:21 PM

## 2023-09-29 NOTE — ED Notes (Signed)
 Cleaned and dried pt. Patient voided x1 and Lg bm, both incont.

## 2023-09-29 NOTE — ED Notes (Signed)
 Increase in wheezing and sats dropping again on 6L [place].  MD notified

## 2023-09-29 NOTE — ED Notes (Signed)
 MD notified of glucose orders received.

## 2023-09-29 NOTE — ED Triage Notes (Signed)
 BIB EMS from home, reports normal last night.  But this morning found him not responding as normal.  Covered in dark black vomited and soiled.  Temp 102 with EMS.  Unsure of falls.  Reports he vomited last night.  Foul urine odor.  Initially 79% RA placed on NRB 89%.  102/58 EtCO2 22-26  RR 36-40 Hx DM.

## 2023-09-29 NOTE — Sepsis Progress Note (Signed)
 Elink monitoring for the code sepsis protocol.

## 2023-09-29 NOTE — ED Notes (Signed)
 Lab called to add Hgb A1C to blood in lab as well as adding legionella to urine in lab

## 2023-09-30 DIAGNOSIS — A419 Sepsis, unspecified organism: Secondary | ICD-10-CM | POA: Diagnosis not present

## 2023-09-30 DIAGNOSIS — K219 Gastro-esophageal reflux disease without esophagitis: Secondary | ICD-10-CM

## 2023-09-30 DIAGNOSIS — I10 Essential (primary) hypertension: Secondary | ICD-10-CM

## 2023-09-30 DIAGNOSIS — J181 Lobar pneumonia, unspecified organism: Secondary | ICD-10-CM | POA: Diagnosis not present

## 2023-09-30 DIAGNOSIS — C61 Malignant neoplasm of prostate: Secondary | ICD-10-CM

## 2023-09-30 DIAGNOSIS — E16A3 Hypoglycemia level 3: Secondary | ICD-10-CM

## 2023-09-30 DIAGNOSIS — E876 Hypokalemia: Secondary | ICD-10-CM | POA: Insufficient documentation

## 2023-09-30 DIAGNOSIS — E782 Mixed hyperlipidemia: Secondary | ICD-10-CM

## 2023-09-30 DIAGNOSIS — R6520 Severe sepsis without septic shock: Secondary | ICD-10-CM

## 2023-09-30 DIAGNOSIS — J9601 Acute respiratory failure with hypoxia: Secondary | ICD-10-CM | POA: Diagnosis not present

## 2023-09-30 LAB — CBC
HCT: 39.2 % (ref 39.0–52.0)
Hemoglobin: 12.5 g/dL — ABNORMAL LOW (ref 13.0–17.0)
MCH: 28.3 pg (ref 26.0–34.0)
MCHC: 31.9 g/dL (ref 30.0–36.0)
MCV: 88.7 fL (ref 80.0–100.0)
Platelets: 205 K/uL (ref 150–400)
RBC: 4.42 MIL/uL (ref 4.22–5.81)
RDW: 14.3 % (ref 11.5–15.5)
WBC: 8.2 K/uL (ref 4.0–10.5)
nRBC: 0 % (ref 0.0–0.2)

## 2023-09-30 LAB — COMPREHENSIVE METABOLIC PANEL WITH GFR
ALT: 21 U/L (ref 0–44)
AST: 33 U/L (ref 15–41)
Albumin: 3.2 g/dL — ABNORMAL LOW (ref 3.5–5.0)
Alkaline Phosphatase: 50 U/L (ref 38–126)
Anion gap: 11 (ref 5–15)
BUN: 16 mg/dL (ref 8–23)
CO2: 25 mmol/L (ref 22–32)
Calcium: 8.5 mg/dL — ABNORMAL LOW (ref 8.9–10.3)
Chloride: 108 mmol/L (ref 98–111)
Creatinine, Ser: 0.97 mg/dL (ref 0.61–1.24)
GFR, Estimated: >60 mL/min (ref >60)
Glucose, Bld: 50 mg/dL — ABNORMAL LOW (ref 70–99)
Potassium: 3.2 mmol/L — ABNORMAL LOW (ref 3.5–5.1)
Sodium: 144 mmol/L (ref 135–145)
Total Bilirubin: 1.6 mg/dL — ABNORMAL HIGH (ref 0.0–1.2)
Total Protein: 6.4 g/dL — ABNORMAL LOW (ref 6.5–8.1)

## 2023-09-30 LAB — GLUCOSE, RANDOM: Glucose, Bld: 39 mg/dL — CL (ref 70–99)

## 2023-09-30 LAB — PROTIME-INR
INR: 1.3 — ABNORMAL HIGH (ref 0.8–1.2)
Prothrombin Time: 16.9 s — ABNORMAL HIGH (ref 11.4–15.2)

## 2023-09-30 LAB — GLUCOSE, CAPILLARY
Glucose-Capillary: 38 mg/dL — CL (ref 70–99)
Glucose-Capillary: 41 mg/dL — CL (ref 70–99)
Glucose-Capillary: 183 mg/dL — ABNORMAL HIGH (ref 70–99)
Glucose-Capillary: 172 mg/dL — ABNORMAL HIGH (ref 70–99)
Glucose-Capillary: 227 mg/dL — ABNORMAL HIGH (ref 70–99)
Glucose-Capillary: 146 mg/dL — ABNORMAL HIGH (ref 70–99)

## 2023-09-30 LAB — LEGIONELLA PNEUMOPHILA SEROGP 1 UR AG: L. pneumophila Serogp 1 Ur Ag: NEGATIVE

## 2023-09-30 LAB — MRSA NEXT GEN BY PCR, NASAL: MRSA by PCR Next Gen: NOT DETECTED

## 2023-09-30 LAB — MAGNESIUM: Magnesium: 1.6 mg/dL — ABNORMAL LOW (ref 1.7–2.4)

## 2023-09-30 MED ORDER — MAGNESIUM SULFATE 2 GM/50ML IV SOLN
2.0000 g | Freq: Once | INTRAVENOUS | Status: AC
  Administered 2023-09-30: 2 g via INTRAVENOUS
  Filled 2023-09-30: qty 50

## 2023-09-30 MED ORDER — IPRATROPIUM-ALBUTEROL 0.5-2.5 (3) MG/3ML IN SOLN
3.0000 mL | Freq: Four times a day (QID) | RESPIRATORY_TRACT | Status: DC
  Administered 2023-09-30 – 2023-10-04 (×14): 3 mL via RESPIRATORY_TRACT
  Filled 2023-09-30 (×14): qty 3

## 2023-09-30 MED ORDER — POTASSIUM CHLORIDE CRYS ER 20 MEQ PO TBCR
40.0000 meq | EXTENDED_RELEASE_TABLET | Freq: Once | ORAL | Status: AC
  Administered 2023-09-30: 40 meq via ORAL
  Filled 2023-09-30: qty 2

## 2023-09-30 MED ORDER — FUROSEMIDE 20 MG PO TABS
20.0000 mg | ORAL_TABLET | Freq: Every day | ORAL | Status: DC
  Administered 2023-10-01 – 2023-10-03 (×3): 20 mg via ORAL
  Filled 2023-09-30 (×3): qty 1

## 2023-09-30 MED ORDER — DEXTROSE 50 % IV SOLN
25.0000 g | Freq: Once | INTRAVENOUS | Status: AC
  Administered 2023-09-30: 25 g via INTRAVENOUS
  Filled 2023-09-30: qty 50

## 2023-09-30 NOTE — Hospital Course (Addendum)
 78 y.o. male with medical history significant for CAD s/p CABG HFpEF, HLD, GERD, HTN, DM on insulin , anxiety who presented to ED after altered mental status, fever, nausea and vomiting.  EMS was called out by the patient's wife who last saw the patient normal last night.  This morning she found the patient to be very somnolent, less responsive, covered with vomitus which presumably happened overnight.  EMS states roommate air saturation was 82% with no baseline oxygen requirement, placed the patient on nonrebreather found to have a 102.6 F temperature, transported to emergency room for further evaluation. Patient was seen and examined at bedside in the emergency room.  Patient was alert awake and oriented.  He stated that he was okay while he went to the bed last night.  But he was not able to tell me what happened in the night and this morning.  He was able to tell me he is in the hospital and he was able to tell me the date, year and the month.   ED Course: Upon arrival to the ED, patient is found to be alert awake, was able to tell his name but unable to tell where is he and what is the date.  Chest x-ray showed right midlung opacity concerning for pneumonia, leukopenia at 2.9, hypoxemia with normal lactic acid at 1.6.  Respiratory panel were negative patient required humidified high flow oxygen via nasal cannula at 15 L in upper 90s saturation.  Patient was given vancomycin  metronidazole  and cefepime  and 1 L of lactated Ringer 's bolus and hospitalist service was consulted for evaluation for admission for possible pneumonia and acute hypoxic respiratory failure  8/29.  Called this morning for hypoglycemia.  Patient was mentating and following commands.  Sugar confirmed low on lab draw and was replaced.  Patient received Lantus  20 units last night.  Normally takes 70/30 insulin  60 units twice a day.  Patient admitted with acute respiratory failure on 15 L high flow nasal cannula secondary to pneumonia right  side.  8/30.  Patient had a rough night did not sleep pulled out numerous IVs and wanting to go home.  Called to see patient first.  Patient delirious.  Family at bedside.  9/1.  Patient still on 10 L high flow nasal cannula.  Feeling better.  Sitting up eating lunch.  Pulmonary started steroids.  9/3: Vital stable on 5 L of oxygen, CBG elevated-increasing Lantus  to 28 and meal coverage to 5 as patient is on steroid now  9/4: Vital stable now on 2 L of oxygen.  CBG remained elevated so increasing Lantus  to 30 and mealtime coverage to 7.  Started tapering steroid.  TOC is working on placement.  9/5: Remained hemodynamically stable, awaiting [facility] approval for SNF.  CBG now less than 100 as we started tapering steroids so decreasing Lantus  to 25 units.  9/6: Hemodynamically stable, now on room air.  CBG now within goal.  Awaiting SNF placement  9/7: Blood pressure now started trending up, restarting home losartan  at a lower dose of 25 mg daily.  Still pending placement  9/8: Remained hemodynamically stable, awaiting [facility] approved facility for disposition.  9/9: Remains stable and still awaiting for [facility] approved SNF

## 2023-09-30 NOTE — Plan of Care (Signed)
  Problem: Education: Goal: Ability to describe self-care measures that may prevent or decrease complications (Diabetes Survival Skills Education) will improve Outcome: Progressing Goal: Individualized Educational Video(s) Outcome: Progressing   Problem: Coping: Goal: Ability to adjust to condition or change in health will improve Outcome: Progressing   Problem: Fluid Volume: Goal: Ability to maintain a balanced intake and output will improve Outcome: Progressing   Problem: Metabolic: Goal: Ability to maintain appropriate glucose levels will improve Outcome: Progressing   Problem: Skin Integrity: Goal: Risk for impaired skin integrity will decrease Outcome: Progressing   Problem: Tissue Perfusion: Goal: Adequacy of tissue perfusion will improve Outcome: Progressing   Problem: Clinical Measurements: Goal: Ability to maintain clinical measurements within normal limits will improve Outcome: Progressing Goal: Will remain free from infection Outcome: Progressing Goal: Diagnostic test results will improve Outcome: Progressing Goal: Respiratory complications will improve Outcome: Progressing Goal: Cardiovascular complication will be avoided Outcome: Progressing   Problem: Activity: Goal: Risk for activity intolerance will decrease Outcome: Progressing   Problem: Skin Integrity: Goal: Risk for impaired skin integrity will decrease Outcome: Progressing   Problem: Respiratory: Goal: Ability to maintain adequate ventilation will improve Outcome: Progressing Goal: Ability to maintain a clear airway will improve Outcome: Progressing

## 2023-09-30 NOTE — Inpatient Diabetes Management (Signed)
 Inpatient Diabetes Program Recommendations  AACE/ADA: New Consensus Statement on Inpatient Glycemic Control (2015)  Target Ranges:  Prepandial:   less than 140 mg/dL      Peak postprandial:   less than 180 mg/dL (1-2 hours)      Critically ill patients:  140 - 180 mg/dL    Latest Reference Range & Units 09/29/23 10:36 09/29/23 15:49 09/29/23 17:04 09/29/23 20:29 09/30/23 08:14 09/30/23 08:15  Glucose-Capillary 70 - 99 mg/dL 824 (H) 550 (H) 866 (H) 138 (H)  20 units Lantus  @2207  38 (LL) 41 (LL)     Admit with:  Acute hypoxemic respiratory failure likely due to pneumonia   History: DM, CHF  Home DM Meds: Jardiance 12.5 mg QAM       70/30 Insulin  60 units AM/ 50 units PM       Metformin  1000 mg BID       Ozempic  2 mg Qweek  Current Orders: Novolog  0-6 units TID AC + HS     Note Severe Hypoglycemia this AM after getting 20 units Lantus  last PM Lantus  has been stopped for now Current A1c Level Pending   Recommend Increase frequency of the CBG checks to Q4 hours     --Will follow patient during hospitalization--  Adina Rudolpho Arrow RN, MSN, CDCES Diabetes Coordinator Inpatient Glycemic Control Team Team Pager: [phone] (8a-5p)

## 2023-09-30 NOTE — Evaluation (Addendum)
 Occupational Therapy Evaluation Patient Details Name: Cristian Martin MRN: [id number] DOB: October 28, 1945 Today's Date: 09/30/2023   History of Present Illness   Cristian Martin is a pleasant 78 y.o. male with medical history significant for CAD s/p CABG HFpEF, HLD, GERD, HTN, DM on insulin , anxiety who presented to ED after altered mental status, fever, nausea and vomiting. MD dx include: Acute hypoxemic respiratory failure likely due to pneumonia, congestive heart failure, HTN, diabetes, hypokalemia, and CAD.   Clinical Impressions Cristian Martin was seen for OT evaluation this date. Prior to hospital admission, pt was MOD I using rollator. Pt currently requires MAX A pericare in standing, MIN A + RW sit<>stand. Fatigues quickly and returns to bed, completed therex at bed in chair as described below. Educated on IS. SpO2 low 90s on 15L. Pt would benefit from skilled OT to address noted impairments and functional limitations (see below for any additional details). Upon hospital discharge, recommend OT follow up <3 hours/day.     If plan is discharge home, recommend the following:   A lot of help with walking and/or transfers;A lot of help with bathing/dressing/bathroom     Functional Status Assessment   Patient has had a recent decline in their functional status and demonstrates the ability to make significant improvements in function in a reasonable and predictable amount of time.     Equipment Recommendations   BSC/3in1     Recommendations for Other Services         Precautions/Restrictions   Precautions Precautions: Fall Restrictions Weight Bearing Restrictions Per Provider Order: No     Mobility Bed Mobility Overal bed mobility: Needs Assistance Bed Mobility: Sit to Supine       Sit to supine: Mod assist, +2 for physical assistance        Transfers Overall transfer level: Needs assistance Equipment used: Rolling walker (2 wheels) Transfers: Sit to/from Stand Sit  to Stand: Min assist                  Balance Overall balance assessment: Needs assistance Sitting-balance support: Feet supported, Single extremity supported Sitting balance-Leahy Scale: Fair     Standing balance support: Bilateral upper extremity supported, During functional activity, Reliant on assistive device for balance Standing balance-Leahy Scale: Poor                             ADL either performed or assessed with clinical judgement   ADL Overall ADL's : Needs assistance/impaired                                       General ADL Comments: SETUP + SBA seated grooming tasks      Pertinent Vitals/Pain Pain Assessment Pain Assessment: No/denies pain     Extremity/Trunk Assessment Upper Extremity Assessment Upper Extremity Assessment: Overall WFL for tasks assessed   Lower Extremity Assessment Lower Extremity Assessment: Generalized weakness   Cervical / Trunk Assessment Cervical / Trunk Assessment: Normal   Communication Communication Communication: No apparent difficulties Factors Affecting Communication: Reduced clarity of speech (Speaks quietly making it hard to hear him)   Cognition Arousal: Alert Behavior During Therapy: [facility] for tasks assessed/performed Cognition: No apparent impairments  Following commands: Intact       Cueing  General Comments      SpO2 90% on 15L   Exercises Exercises: Other exercises Other Exercises Other Exercises: 2 set x 10 reps each rail assisted sit ups   Shoulder Instructions      Home Living Family/patient expects to be discharged to:: Private residence Living Arrangements: Spouse/significant other Available Help at Discharge:  (Pt reports he currently has no help at home) Type of Home: House Home Access: Ramped entrance     Home Layout: One level               Home Equipment: Architectural technologist (4 wheels);Shower  Counsellor (2 wheels);Grab bars - tub/shower   Additional Comments: Pt reports that he uses rollator for outside of his house and the quad cane for amb inside his house.      Prior Functioning/Environment Prior Level of Function : Independent/Modified Independent             Mobility Comments: Use of rollator outside his home and quad can within his home. Reports 3 falls in the past 6 months. ADLs Comments: pt reports independence with ADLs    OT Problem List: Decreased activity tolerance;Impaired balance (sitting and/or standing);Decreased strength;Cardiopulmonary status limiting activity   OT Treatment/Interventions: Therapeutic exercise;Self-care/ADL training;Energy conservation;DME and/or AE instruction;Therapeutic activities;Patient/family education      OT Goals(Current goals can be found in the care plan section)   Acute Rehab OT Goals Patient Stated Goal: to go home OT Goal Formulation: With patient Time For Goal Achievement: 10/14/23 Potential to Achieve Goals: Good ADL Goals Pt Will Perform Grooming: with modified independence;standing Pt Will Perform Lower Body Dressing: with modified independence;sit to/from stand Pt Will Transfer to Toilet: with modified independence;ambulating;regular height toilet   OT Frequency:  Min 2X/week    Co-evaluation              AM-PAC OT 6 Clicks Daily Activity     Outcome Measure Help from another person eating meals?: None Help from another person taking care of personal grooming?: A Little Help from another person toileting, which includes using toliet, bedpan, or urinal?: A Lot Help from another person bathing (including washing, rinsing, drying)?: A Lot Help from another person to put on and taking off regular upper body clothing?: A Little Help from another person to put on and taking off regular lower body clothing?: A Lot 6 Click Score: 16   End of Session Equipment Utilized During Treatment:  Oxygen  Activity Tolerance: Patient tolerated treatment well Patient left: in bed;with call bell/phone within reach;with bed alarm set  OT Visit Diagnosis: Other abnormalities of gait and mobility (R26.89);Muscle weakness (generalized) (M62.81)                Time: 8585-8565 OT Time Calculation (min): 20 min Charges:  OT General Charges $OT Visit: 1 Visit OT Evaluation $OT Eval Low Complexity: 1 Low OT Treatments $Therapeutic Exercise: 8-22 mins  Elston Slot, M.S. OTR/L  09/30/23, 3:49 PM  ascom [phone]

## 2023-09-30 NOTE — Assessment & Plan Note (Addendum)
 Replaced

## 2023-09-30 NOTE — Assessment & Plan Note (Addendum)
 Present on admission with fever, tachycardia, tachypnea, hypoxia (acute respiratory failure), leukopenia.  Patient has lobar pneumonia on right side.  Since MRSA PCR negative vancomycin  was discontinued.  Patient on Rocephin  and completed Zithromax .  Pulmonary started steroids. - Started tapering steroid now

## 2023-09-30 NOTE — Assessment & Plan Note (Signed)
 On Protonix

## 2023-09-30 NOTE — Progress Notes (Signed)
 Progress Note   Patient: Cristian Martin FMW:[id number] DOB: Aug 04, 1945 DOA: 09/29/2023     1 DOS: the patient was seen and examined on 09/30/2023   Brief hospital course: 78 y.o. male with medical history significant for CAD s/p CABG HFpEF, HLD, GERD, HTN, DM on insulin , anxiety who presented to ED after altered mental status, fever, nausea and vomiting.  EMS was called out by the patient's wife who last saw the patient normal last night.  This morning she found the patient to be very somnolent, less responsive, covered with vomitus which presumably happened overnight.  EMS states roommate air saturation was 82% with no baseline oxygen requirement, placed the patient on nonrebreather found to have a 102.6 F temperature, transported to emergency room for further evaluation. Patient was seen and examined at bedside in the emergency room.  Patient was alert awake and oriented.  He stated that he was okay while he went to the bed last night.  But he was not able to tell me what happened in the night and this morning.  He was able to tell me he is in the hospital and he was able to tell me the date, year and the month.   ED Course: Upon arrival to the ED, patient is found to be alert awake, was able to tell his name but unable to tell where is he and what is the date.  Chest x-ray showed right midlung opacity concerning for pneumonia, leukopenia at 2.9, hypoxemia with normal lactic acid at 1.6.  Respiratory panel were negative patient required humidified high flow oxygen via nasal cannula at 15 L in upper 90s saturation.  Patient was given vancomycin  metronidazole  and cefepime  and 1 L of lactated Ringer 's bolus and hospitalist service was consulted for evaluation for admission for possible pneumonia and acute hypoxic respiratory failure  8/29.  Called this morning for hypoglycemia.  Patient was mentating and following commands.  Sugar confirmed low on lab draw and was replaced.  Patient received Lantus  20  units last night.  Normally takes 70/30 insulin  60 units twice a day.  Patient admitted with acute respiratory failure on 15 L high flow nasal cannula secondary to pneumonia right side.  Assessment and Plan: * Severe sepsis (HCC) Present on admission with fever, tachycardia, tachypnea, hypoxia (acute respiratory failure), leukopenia.  Patient has lobar pneumonia on right side.  Since MRSA PCR negative will get rid of vancomycin .  Patient on Maxipime  and Zithromax .  Hypoglycemia level 3 Sugar 38 this morning.  Patient received 20 units of Lantus  last night.  Patient given replacement and now sugars elevated.  Patient on sliding scale insulin .  Will hold off on long-acting insulin  currently.  Lobar pneumonia (HCC) Right-sided pneumonia.  Continue Maxipime  and Zithromax .  Since MRSA PCR negative vancomycin  discontinued.  Sputum culture, incentive spirometry.  Acute hypoxemic respiratory failure (HCC) Patient on 15 L high flow nasal cannula in order to oxygenate.  Initial pulse ox 88% on nonrebreather mask  Hypomagnesemia Replace IV  Hypokalemia Replace orally  Malignant neoplasm of prostate (HCC) Follow-up as outpatient  Hyperlipidemia, mixed On Crestor   GERD (gastroesophageal reflux disease) On Protonix   Benign essential hypertension Continue Coreg  and Lasix         Subjective: Called to see patient this morning secondary to low sugar.  Patient was mentating and following commands.  Glucose given.  Patient admitted with severe sepsis acute respiratory failure on 15 L high flow nasal cannula pneumonia.  Physical Exam: Vitals:   09/30/23 0408 09/30/23  0500 09/30/23 1100 09/30/23 1412  BP:   124/70   Pulse:      Resp:      Temp: 97.6 F (36.4 C)  98 F (36.7 C)   TempSrc:   Oral   SpO2:    91%  Weight:  82.4 kg    Height:       Physical Exam HENT:     Head: Normocephalic.  Eyes:     General: Lids are normal.     Conjunctiva/sclera: Conjunctivae normal.   Cardiovascular:     Rate and Rhythm: Normal rate and regular rhythm.     Heart sounds: Normal heart sounds, S1 normal and S2 normal.  Pulmonary:     Breath sounds: Examination of the right-middle field reveals decreased breath sounds and rhonchi. Examination of the right-lower field reveals decreased breath sounds and rhonchi. Examination of the left-lower field reveals decreased breath sounds. Decreased breath sounds and rhonchi present. No wheezing or rales.  Abdominal:     Palpations: Abdomen is soft.     Tenderness: There is no abdominal tenderness.  Musculoskeletal:     Right lower leg: No swelling.     Left lower leg: No swelling.  Skin:    General: Skin is warm.     Findings: No rash.  Neurological:     Mental Status: He is alert.     Data Reviewed: White blood cell count 8.2, hemoglobin 12.5, platelet count 205, creatinine 0.97, potassium 3.2, magnesium  1.6, total bilirubin 1.6, lowest glucose 38, chest x-ray right-sided pneumonia  Family Communication: Spoke with wife on the phone  Disposition: Status is: Inpatient Remains inpatient appropriate because: IV antibiotics for severe sepsis.  Also treating hypoglycemia.  Patient on 15 L high flow nasal cannula  Planned Discharge Destination: To be determined    Time spent: 35 minutes Patient critically ill with hypoglycemia episode, acute respiratory failure and severe sepsis secondary to lobar pneumonia  Author: Charlie Patterson, MD 09/30/2023 2:48 PM  For on call review [email or web address].

## 2023-09-30 NOTE — Assessment & Plan Note (Addendum)
 Right-sided pneumonia.  Continue Rocephin  and completed Zithromax .  Since MRSA PCR negative vancomycin  discontinued.  Sputum culture, nebulizers and incentive spirometry.

## 2023-09-30 NOTE — Care Management Important Message (Signed)
 Important Message  Patient Details  Name: Cristian Martin MRN: [id number] Date of Birth: November 02, 1945   Important Message Given:  Yes - Medicare IM     [name] 09/30/2023, 4:12 PM

## 2023-09-30 NOTE — Assessment & Plan Note (Addendum)
 Sugar 38 on the morning of 8/29.  Patient on sliding scale insulin .  Will start Lantus  6 units this evening since sugars have increased.

## 2023-09-30 NOTE — Assessment & Plan Note (Addendum)
 Initial pulse ox 88% on nonrebreather mask.  Patient on 15 L high flow nasal cannula for 2 days.   Pulmonary started him on steroids with improvement in oxygen requirement, now on room air - Continue with steroid taper

## 2023-09-30 NOTE — Assessment & Plan Note (Signed)
 On Crestor 

## 2023-09-30 NOTE — Assessment & Plan Note (Addendum)
 Blood pressure within goal today - Continue Coreg  - Home dose of losartan  losartan  was restarted at a lower dose of 25 mg daily-Will continue current dose

## 2023-09-30 NOTE — Evaluation (Signed)
 Physical Therapy Evaluation Patient Details Name: Cristian Martin MRN: [id number] DOB: 12/31/1945 Today's Date: 09/30/2023  History of Present Illness  Cristian Martin is a pleasant 78 y.o. male with medical history significant for CAD s/p CABG HFpEF, HLD, GERD, HTN, DM on insulin , anxiety who presented to ED after altered mental status, fever, nausea and vomiting. MD dx include: Acute hypoxemic respiratory failure likely due to pneumonia, congestive heart failure, HTN, diabetes, hypokalemia, and CAD.  Clinical Impression  Pt is a pleasant 78 year old male who was admitted for acute respiratory failure. Prior to hospitalization, pt reports MOD I for ambulation using a rollator for community ambulation and quad can for ambulation inside his home. He also reports independence with ADLs. Pt now performs bed mobility with MOD A x 2. Pt demonstrates STS with Min A to hold down RW; pt pulled up on RW despite cuing to push from the bed. Once up, pt able to perform side steps at EOB with CGA. Pt demonstrated forward flexed posturing and heavy reliance on RW to clear feet for side stepping. Pt SpO2 stayed WNL throughout session and remains on 15L of O2. Pt demonstrates deficits with activity tolerance, strength, and balance. Would benefit from skilled PT to address above deficits and promote optimal return to PLOF.          If plan is discharge home, recommend the following: A lot of help with walking and/or transfers;A lot of help with bathing/dressing/bathroom;Assistance with cooking/housework;Assist for transportation;Help with stairs or ramp for entrance   Can travel by private vehicle   No    Equipment Recommendations Other (comment) (TBD at next venue of care)  Recommendations for Other Services       Functional Status Assessment Patient has had a recent decline in their functional status and demonstrates the ability to make significant improvements in function in a reasonable and predictable  amount of time.     Precautions / Restrictions Precautions Precautions: Fall Recall of Precautions/Restrictions: Intact Restrictions Weight Bearing Restrictions Per Provider Order: No      Mobility  Bed Mobility Overal bed mobility: Needs Assistance Bed Mobility: Supine to Sit, Sit to Supine     Supine to sit: Mod assist, +2 for physical assistance Sit to supine: Mod assist, +2 for physical assistance   General bed mobility comments: Pt requires mod physical assistance at trunk and for LEs. Able to initate supine<>sit. Verbal cuing for hand placement.    Transfers Overall transfer level: Needs assistance Equipment used: Rolling walker (2 wheels) Transfers: Sit to/from Stand Sit to Stand: Min assist           General transfer comment: Pt able to STS from EOB. Despite verbal cuing, pt pulls from RW to stand. Min assistance to hold RW down to allow safe STS.    Ambulation/Gait Ambulation/Gait assistance: Contact guard assist             General Gait Details: Pt able to take side steps at EOB with CGA. Forward flexed posture once up, and heavy reliance on AD. Able to slightly clear feet to take steps. Max of 2-3 steps.  Stairs            Wheelchair Mobility     Tilt Bed    Modified Rankin (Stroke Patients Only)       Balance Overall balance assessment: Needs assistance Sitting-balance support: Feet supported, Single extremity supported Sitting balance-Leahy Scale: Fair Sitting balance - Comments: Pt able to maintain sitting balance at  EOB however demonstrates R lateral lean. Use of SUE to maintain balance. With verbal cuing, pt able to correct posture to sitting at midline. Postural control: Right lateral lean Standing balance support: Bilateral upper extremity supported, During functional activity, Reliant on assistive device for balance Standing balance-Leahy Scale: Poor Standing balance comment: Pt demonstrates forward flexed posture with  standing. Reliant on RW for balance.                             Pertinent Vitals/Pain Pain Assessment Pain Assessment: No/denies pain    Home Living Family/patient expects to be discharged to:: Private residence Living Arrangements: Spouse/significant other Available Help at Discharge:  (Pt reports he currently has no help at home) Type of Home: House Home Access: Ramped entrance       Home Layout: One level Home Equipment: Architectural technologist (4 wheels);Shower Counsellor (2 wheels);Grab bars - tub/shower Additional Comments: Pt reports that he uses rollator for outside of his house and the quad cane for amb inside his house.    Prior Function Prior Level of Function : Independent/Modified Independent             Mobility Comments: Use of rollator outside his home and quad can within his home. Reports 3 falls in the past 6 months. ADLs Comments: pt reports independence with ADLs     Extremity/Trunk Assessment   Upper Extremity Assessment Upper Extremity Assessment: Generalized weakness    Lower Extremity Assessment Lower Extremity Assessment: Generalized weakness    Cervical / Trunk Assessment Cervical / Trunk Assessment: Normal  Communication   Communication Communication: Impaired Factors Affecting Communication: Reduced clarity of speech (Speaks quietly making it hard to hear him)    Cognition Arousal: Alert Behavior During Therapy: WFL for tasks assessed/performed   PT - Cognitive impairments: No apparent impairments                       PT - Cognition Comments: Pt is pleasant and agreeable to PT session. Following commands: Intact       Cueing Cueing Techniques: Verbal cues, Visual cues     General Comments General comments (skin integrity, edema, etc.): 15L O2. According to hospital grad pulse ox, Spo2 at 93% after exertion.    Exercises     Assessment/Plan    PT Assessment Patient needs continued PT services   PT Problem List Decreased strength;Decreased activity tolerance;Decreased balance;Decreased mobility;Cardiopulmonary status limiting activity       PT Treatment Interventions Gait training;DME instruction;Functional mobility training;Therapeutic activities;Therapeutic exercise;Balance training;Neuromuscular re-education;Patient/family education    PT Goals (Current goals can be found in the Care Plan section)  Acute Rehab PT Goals Patient Stated Goal: to get better PT Goal Formulation: With patient Time For Goal Achievement: 10/14/23 Potential to Achieve Goals: Good    Frequency Min 2X/week     Co-evaluation               AM-PAC PT 6 Clicks Mobility  Outcome Measure Help needed turning from your back to your side while in a flat bed without using bedrails?: A Lot Help needed moving from lying on your back to sitting on the side of a flat bed without using bedrails?: A Lot Help needed moving to and from a bed to a chair (including a wheelchair)?: A Lot Help needed standing up from a chair using your arms (e.g., wheelchair or bedside chair)?: A Little Help needed to  walk in hospital room?: A Lot Help needed climbing 3-5 steps with a railing? : A Lot 6 Click Score: 13    End of Session Equipment Utilized During Treatment: Gait belt;Oxygen Activity Tolerance: Patient tolerated treatment well Patient left: in bed;with call bell/phone within reach (OT present) Nurse Communication: Mobility status PT Visit Diagnosis: Unsteadiness on feet (R26.81);Muscle weakness (generalized) (M62.81);History of falling (Z91.81);Difficulty in walking, not elsewhere classified (R26.2)    Time: 8646-8586 PT Time Calculation (min) (ACUTE ONLY): 20 min   Charges:                 Ayauna Mcnay, SPT   Finian Helvey 09/30/2023, 3:09 PM

## 2023-09-30 NOTE — Assessment & Plan Note (Signed)
 Follow up as outpatient

## 2023-10-01 DIAGNOSIS — Z515 Encounter for palliative care: Secondary | ICD-10-CM | POA: Diagnosis not present

## 2023-10-01 DIAGNOSIS — R410 Disorientation, unspecified: Secondary | ICD-10-CM

## 2023-10-01 DIAGNOSIS — J189 Pneumonia, unspecified organism: Secondary | ICD-10-CM

## 2023-10-01 DIAGNOSIS — J181 Lobar pneumonia, unspecified organism: Secondary | ICD-10-CM | POA: Diagnosis not present

## 2023-10-01 DIAGNOSIS — Z7189 Other specified counseling: Secondary | ICD-10-CM | POA: Diagnosis not present

## 2023-10-01 DIAGNOSIS — A419 Sepsis, unspecified organism: Secondary | ICD-10-CM | POA: Diagnosis not present

## 2023-10-01 DIAGNOSIS — E16A3 Hypoglycemia level 3: Secondary | ICD-10-CM | POA: Diagnosis not present

## 2023-10-01 DIAGNOSIS — J9601 Acute respiratory failure with hypoxia: Secondary | ICD-10-CM | POA: Diagnosis not present

## 2023-10-01 LAB — CBC WITH DIFFERENTIAL/PLATELET
Abs Immature Granulocytes: 0.17 K/uL — ABNORMAL HIGH (ref 0.00–0.07)
Basophils Absolute: 0 K/uL (ref 0.0–0.1)
Basophils Relative: 0 %
Eosinophils Absolute: 0.3 K/uL (ref 0.0–0.5)
Eosinophils Relative: 3 %
HCT: 38.2 % — ABNORMAL LOW (ref 39.0–52.0)
Hemoglobin: 12.6 g/dL — ABNORMAL LOW (ref 13.0–17.0)
Immature Granulocytes: 3 %
Lymphocytes Relative: 9 %
Lymphs Abs: 0.8 K/uL (ref 0.7–4.0)
MCH: 28.6 pg (ref 26.0–34.0)
MCHC: 33 g/dL (ref 30.0–36.0)
MCV: 86.8 fL (ref 80.0–100.0)
Monocytes Absolute: 0.3 K/uL (ref 0.1–1.0)
Monocytes Relative: 3 %
Neutro Abs: 7.1 K/uL (ref 1.7–7.7)
Neutrophils Relative %: 82 %
Platelets: 215 K/uL (ref 150–400)
RBC: 4.4 MIL/uL (ref 4.22–5.81)
RDW: 13.9 % (ref 11.5–15.5)
Smear Review: NORMAL
WBC: 8.8 K/uL (ref 4.0–10.5)
nRBC: 0 % (ref 0.0–0.2)

## 2023-10-01 LAB — BLOOD GAS, ARTERIAL
Acid-Base Excess: 3.4 mmol/L — ABNORMAL HIGH (ref 0.0–2.0)
Bicarbonate: 26.8 mmol/L (ref 20.0–28.0)
O2 Saturation: 96.6 %
Patient temperature: 37
pCO2 arterial: 36 mmHg (ref 32–48)
pH, Arterial: 7.48 — ABNORMAL HIGH (ref 7.35–7.45)
pO2, Arterial: 78 mmHg — ABNORMAL LOW (ref 83–108)

## 2023-10-01 LAB — BASIC METABOLIC PANEL WITH GFR
Anion gap: 15 (ref 5–15)
BUN: 18 mg/dL (ref 8–23)
CO2: 23 mmol/L (ref 22–32)
Calcium: 8.6 mg/dL — ABNORMAL LOW (ref 8.9–10.3)
Chloride: 103 mmol/L (ref 98–111)
Creatinine, Ser: 1.01 mg/dL (ref 0.61–1.24)
GFR, Estimated: >60 mL/min (ref >60)
Glucose, Bld: 178 mg/dL — ABNORMAL HIGH (ref 70–99)
Potassium: 3.6 mmol/L (ref 3.5–5.1)
Sodium: 141 mmol/L (ref 135–145)

## 2023-10-01 LAB — GLUCOSE, CAPILLARY
Glucose-Capillary: 197 mg/dL — ABNORMAL HIGH (ref 70–99)
Glucose-Capillary: 210 mg/dL — ABNORMAL HIGH (ref 70–99)
Glucose-Capillary: 151 mg/dL — ABNORMAL HIGH (ref 70–99)
Glucose-Capillary: 158 mg/dL — ABNORMAL HIGH (ref 70–99)
Glucose-Capillary: 143 mg/dL — ABNORMAL HIGH (ref 70–99)
Glucose-Capillary: 161 mg/dL — ABNORMAL HIGH (ref 70–99)

## 2023-10-01 MED ORDER — HALOPERIDOL LACTATE 5 MG/ML IJ SOLN
1.0000 mg | Freq: Four times a day (QID) | INTRAMUSCULAR | Status: DC | PRN
  Administered 2023-10-01 – 2023-10-03 (×3): 1 mg via INTRAVENOUS
  Filled 2023-10-01 (×4): qty 1

## 2023-10-01 MED ORDER — SODIUM CHLORIDE 0.9 % IV SOLN
2.0000 g | INTRAVENOUS | Status: DC
  Administered 2023-10-01 – 2023-10-08 (×8): 2 g via INTRAVENOUS
  Filled 2023-10-01 (×9): qty 20

## 2023-10-01 MED ORDER — MORPHINE SULFATE (PF) 2 MG/ML IV SOLN
1.0000 mg | Freq: Once | INTRAVENOUS | Status: AC
  Administered 2023-10-01: 1 mg via INTRAVENOUS
  Filled 2023-10-01: qty 1

## 2023-10-01 MED ORDER — QUETIAPINE FUMARATE 25 MG PO TABS
25.0000 mg | ORAL_TABLET | Freq: Every day | ORAL | Status: DC
  Administered 2023-10-01 – 2023-10-13 (×13): 25 mg via ORAL
  Filled 2023-10-01 (×13): qty 1

## 2023-10-01 MED ORDER — HYDROCORTISONE SOD SUC (PF) 100 MG IJ SOLR
100.0000 mg | Freq: Two times a day (BID) | INTRAMUSCULAR | Status: DC
  Administered 2023-10-01 – 2023-10-06 (×10): 100 mg via INTRAVENOUS
  Filled 2023-10-01 (×10): qty 2

## 2023-10-01 MED ORDER — INSULIN GLARGINE 100 UNIT/ML ~~LOC~~ SOLN
6.0000 [IU] | Freq: Every day | SUBCUTANEOUS | Status: DC
  Administered 2023-10-01: 6 [IU] via SUBCUTANEOUS
  Filled 2023-10-01 (×2): qty 0.06

## 2023-10-01 NOTE — TOC Initial Note (Signed)
 Transition of Care [facility]) - Initial/Assessment Note    Patient Details  Name: Cristian Martin MRN: [id number] Date of Birth: 1945-04-22  Transition of Care [facility]) CM/SW Contact:    [place] L Donnavin Vandenbrink, LCSW Phone Number: 10/01/2023, 10:54 AM  Clinical Narrative:                  CSW met with patient. Family was at bedside. CSW discussed recommendation for SNF. Patient initially advised that he would like to use his VA insurance. CSW explained that there are no contracted facilities in [facility]. Patient daughter chimed in and advised that she would like bed searches completed in [place], [place], [place], [place] and [place].   Patient advised that he would like to see if CIR was available. He stated that he will use his [employer].   FL2 will be completed.        Patient Goals and CMS Choice            Expected Discharge Plan and Services                                              Prior Living Arrangements/Services                       Activities of Daily Living   ADL Screening (condition at time of admission) Independently performs ADLs?: Yes (appropriate for developmental age) Is the patient deaf or have difficulty hearing?: Yes Does the patient have difficulty seeing, even when wearing glasses/contacts?: No Does the patient have difficulty concentrating, remembering, or making decisions?: Yes  Permission Sought/Granted                  Emotional Assessment              Admission diagnosis:  Community acquired pneumonia [J18.9] Patient Active Problem List   Diagnosis Date Noted   Severe sepsis (HCC) 09/30/2023   Lobar pneumonia (HCC) 09/30/2023   Hypoglycemia level 3 09/30/2023   Hypokalemia 09/30/2023   Hypomagnesemia 09/30/2023   Acute hypoxemic respiratory failure (HCC) 09/29/2023   Lumbar radiculopathy 10/13/2022   Lumbar stenosis with neurogenic claudication 10/13/2022   Paresthesia of  both feet 04/09/2021   Spinal stenosis, lumbar region, with neurogenic claudication 03/03/2021   Chronic radicular lumbar pain 03/03/2021   Lumbar degenerative disc disease 03/03/2021   Lumbar facet arthropathy 03/03/2021   Chronic pain syndrome 03/03/2021   Iron deficiency anemia    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   Coronary artery disease 04/30/2019   Unstable angina (HCC) 04/27/2019   Malignant neoplasm of prostate (HCC) 04/09/2018   GERD (gastroesophageal reflux disease) 04/07/2018   Hx of colonic polyps 04/07/2018   Benign essential hypertension 12/02/2015   Hyperlipidemia, mixed 05/02/2015   Chronic painful diabetic neuropathy (HCC) 03/13/2015   DM (diabetes mellitus) type II controlled with renal manifestation (HCC) 08/08/2013   PCP:  [facility]   [facility] [phone] [address], [place] - [address] [place] [address] Phone: [phone] Fax: [phone]     Social Drivers of Health (SDOH) Social History: SDOH Screenings   Food Insecurity: No Food Insecurity (09/30/2023)  Housing: Low Risk  (09/30/2023)  Transportation Needs: No Transportation Needs (09/30/2023)  Utilities: Not At Risk (09/30/2023)  Depression (PHQ2-9): Medium Risk (08/23/2019)  Social Connections: Moderately  Isolated (09/30/2023)  Tobacco Use: Low Risk  (09/29/2023)   SDOH Interventions:     Readmission Risk Interventions     No data to display

## 2023-10-01 NOTE — Significant Event (Signed)
       CROSS COVER NOTE  NAME: Cristian Martin MRN: [id number] DOB : 07-12-1945 ATTENDING PHYSICIAN: Josette Ade, MD    Date of Service   10/01/2023   HPI/Events of Note   Message received from RN 78 y.o. male with medical history significant for CAD s/p CABG HFpEF, HLD, GERD, HTN, DM on insulin , anxiety who presented to ED after altered mental status, fever, nausea and vomiting. he is currently confused and pulled off his tele and pulled out his IV, he is also currently anxious, can we have something to calm him?   Interventions   Assessment/Plan:    10/01/2023    4:15 AM 10/01/2023    3:12 AM 10/01/2023    2:13 AM  Vitals with BMI  Systolic 155 123 860  Diastolic 107 70 68  Pulse 98 95 97   TEMP                     99.1 Alert and oriented x 3, poor insight into current health state.  Agitated due to wanting to go home.  Redirecting sometimes difficult but overall able to   Morphine  1 mg iv x 1 for air hunger Sitter 1:`1  Delirium precautions       Erminio LITTIE Cone NP [facility] Cross Cover 7pm-7am - check amion for availability Pager [phone]

## 2023-10-01 NOTE — Consult Note (Signed)
   NAME:  Cristian Martin, MRN:  [id number], DOB:  [birth year], LOS: 2 ADMISSION DATE:  09/29/2023, CONSULTATION DATE:  10/01/2023 REFERRING MD:  Dr. Josette, CHIEF COMPLAINT:  Acute hypoxic respiratory failure   History of Present Illness:  [name] is a 78 year old male patient with a past medical history of coronary artery disease status post CABG manage at the [facility], hypertension, type 2 diabetes mellitus, hyperlipidemia and peripheral neuropathy presenting to [facility] on 08/28 for somnolence, lethargy and acute hypoxic respiratory failure.  On presentation to the ED he was found to be febrile with a temp of 102.6 Fahrenheit, saturating in the low 90s on 15 L.  Does not appear to have cough sputum production.  Labs initially with leukopenia with WBC of 2.9 neutrophilic count of 80%.  Kidney function was normal.  Course complicated by hypoglycemia with sugars as low as 39.  Imaging included a chest x-ray that showed new perihilar and basilar opacity concerning for pneumonia.  He was admitted and being managed for community-acquired pneumo with ceftriaxone  and azithromycin .   Unfortunately, patient continues to require Oxygen at 15L per nasal cannula.  Pertinent  Medical History  As above   Interim History / Subjective:  -- Resiting comfortbaly in bed without any signs of acute respiratory distress.  -- On 12L [place] saturating 93%.    Objective    Blood pressure 137/82, pulse 91, temperature (!) 97.5 F (36.4 C), temperature source Oral, resp. rate (!) 28, height 5' 11 (1.803 m), weight 82.4 kg, SpO2 93%.        Intake/Output Summary (Last 24 hours) at 10/01/2023 1415 Last data filed at 10/01/2023 1300 Gross per 24 hour  Intake 1158.87 ml  Output 1100 ml  Net 58.87 ml   Filed Weights   09/29/23 0912 09/30/23 0500  Weight: 88.5 kg 82.4 kg    Examination: General: No acute distress  HENT: Supple neck, reactive pupils  Lungs: Ronchorous around the right hemithorax but otherwise no  wheezing  Cardiovascular: Normal S1, Normal S2, RRR  Abdomen: Soft, non tender, non distended Extremities: Warm and well perfused, no edema.   Labs and imaging were reviewed.   Assessment and Plan  [name] is a 78 year old male patient with a past medical history of coronary artery disease status post CABG manage at the [place], hypertension, type 2 diabetes mellitus, hyperlipidemia and peripheral neuropathy presenting to [facility] on 08/28 for somnolence, lethargy and acute hypoxic respiratory failure.  #CAP (Febrile, CXR with right hilar and basilar opacity) c/b #Acute hypoxic respiratory failure on 12L [place].  #CAD S/P CABG managed at the [facility] #HTN, HLD and Type II DM   Plan  []  MRSA swab, urine strep and legionella, sputum culture if possible.  []  Agree with Ceftriaxone  and azithromycin   []  Start Hydrocortisone  100mg  BID for moderate CAP per [place] trial.  []  Rest of care per primary team.   I spent 80 minutes caring for this patient today, including preparing to see the patient, obtaining a medical history , reviewing a separately obtained history, performing a medically appropriate examination and/or evaluation, counseling and educating the patient/family/caregiver, ordering medications, tests, or procedures, documenting clinical information in the electronic health record, and independently interpreting results (not separately reported/billed) and communicating results to the patient/family/caregiver  Darrin Barn, MD [facility] 10/01/2023 2:27 PM

## 2023-10-01 NOTE — Inpatient Diabetes Management (Signed)
 Inpatient Diabetes Program Recommendations  AACE/ADA: New Consensus Statement on Inpatient Glycemic Control (2015)  Target Ranges:  Prepandial:   less than 140 mg/dL      Peak postprandial:   less than 180 mg/dL (1-2 hours)      Critically ill patients:  140 - 180 mg/dL    Latest Reference Range & Units 09/30/23 08:14 09/30/23 08:15 09/30/23 11:48 09/30/23 12:40 09/30/23 16:26 09/30/23 20:41  Glucose-Capillary 70 - 99 mg/dL 38 (LL) 41 (LL) 816 (H) 172 (H)  1 unit Novolog   227 (H)  2 units Novolog   146 (H)  (LL): Data is critically low (H): Data is abnormally high  Latest Reference Range & Units 10/01/23 06:16  Glucose-Capillary 70 - 99 mg/dL 789 (H)  (H): Data is abnormally high    Home DM Meds: Jardiance 12.5 mg QAM                             70/30 Insulin  60 units AM/ 50 units PM                             Metformin  1000 mg BID                             Ozempic  2 mg Qweek   Current Orders: Novolog  0-6 units TID AC + HS     MD- Note Lantus  Stopped after pt had Severe HYPO yest AM  CBG 210 this AM  May consider restarting Lantus  at lower dose:  Lantus  8 units daily (0.1 units/kg)    --Will follow patient during hospitalization--  Adina Rudolpho Arrow RN, MSN, CDCES Diabetes Coordinator Inpatient Glycemic Control Team Team Pager: [phone] (8a-5p)

## 2023-10-01 NOTE — Progress Notes (Signed)
 Progress Note   Patient: Cristian Martin FMW:[id number] DOB: Jul 16, 1945 DOA: 09/29/2023     2 DOS: the patient was seen and examined on 10/01/2023   Brief hospital course: 78 y.o. male with medical history significant for CAD s/p CABG HFpEF, HLD, GERD, HTN, DM on insulin , anxiety who presented to ED after altered mental status, fever, nausea and vomiting.  EMS was called out by the patient's wife who last saw the patient normal last night.  This morning she found the patient to be very somnolent, less responsive, covered with vomitus which presumably happened overnight.  EMS states roommate air saturation was 82% with no baseline oxygen requirement, placed the patient on nonrebreather found to have a 102.6 F temperature, transported to emergency room for further evaluation. Patient was seen and examined at bedside in the emergency room.  Patient was alert awake and oriented.  He stated that he was okay while he went to the bed last night.  But he was not able to tell me what happened in the night and this morning.  He was able to tell me he is in the hospital and he was able to tell me the date, year and the month.   ED Course: Upon arrival to the ED, patient is found to be alert awake, was able to tell his name but unable to tell where is he and what is the date.  Chest x-ray showed right midlung opacity concerning for pneumonia, leukopenia at 2.9, hypoxemia with normal lactic acid at 1.6.  Respiratory panel were negative patient required humidified high flow oxygen via nasal cannula at 15 L in upper 90s saturation.  Patient was given vancomycin  metronidazole  and cefepime  and 1 L of lactated Ringer 's bolus and hospitalist service was consulted for evaluation for admission for possible pneumonia and acute hypoxic respiratory failure  8/29.  Called this morning for hypoglycemia.  Patient was mentating and following commands.  Sugar confirmed low on lab draw and was replaced.  Patient received Lantus  20  units last night.  Normally takes 70/30 insulin  60 units twice a day.  Patient admitted with acute respiratory failure on 15 L high flow nasal cannula secondary to pneumonia right side. 8/30.  Patient had a rough night did not sleep pulled out numerous IVs and wanting to go home.  Called to see patient first.  Patient delirious.  Family at bedside.  Assessment and Plan: * Severe sepsis (HCC) Present on admission with fever, tachycardia, tachypnea, hypoxia (acute respiratory failure), leukopenia.  Patient has lobar pneumonia on right side.  Since MRSA PCR negative vancomycin  was discontinued.  Patient on Rocephin  and Zithromax .  Acute delirium Will start Seroquel  at night and as needed Haldol .  This morning patient was able to be redirected and distracted.  Familiar faces at bedside with family likely helping.  Hypoglycemia level 3 Sugar 38 on the morning of 8/29.  Patient on sliding scale insulin .  Will start Lantus  6 units this evening since sugars have increased.  Lobar pneumonia (HCC) Right-sided pneumonia.  Continue Rocephin  and Zithromax .  Since MRSA PCR negative vancomycin  discontinued.  Sputum culture, nebulizers and incentive spirometry.  Acute hypoxemic respiratory failure (HCC) Patient on 15 L high flow nasal cannula in order to oxygenate.  Initial pulse ox 88% on nonrebreather mask.  ABG shows a pH of 7.48 pO2 of 78 O2 saturation 96.6 on 15 L high flow nasal cannula.  Hypomagnesemia Replaced  Hypokalemia Replaced  Malignant neoplasm of prostate (HCC) Follow-up as outpatient  Hyperlipidemia,  mixed On Crestor   GERD (gastroesophageal reflux disease) On Protonix   Benign essential hypertension Continue Coreg  and Lasix         Subjective: Patient stated he fired me.  Patient wants to go home and he states he is leaving today.  He did not sleep last night.  Pulled out multiple IVs.  Still on 15 L high flow nasal cannula.  Admitted with pneumonia.  Physical  Exam: Vitals:   10/01/23 0213 10/01/23 0312 10/01/23 0415 10/01/23 0725  BP: 139/68 123/70 (!) 155/107 (!) 141/74  Pulse: 97 95 98 94  Resp: (!) 25 (!) 24 (!) 28 (!) 28  Temp:   99.1 F (37.3 C) (!) 97.5 F (36.4 C)  TempSrc:   Oral Oral  SpO2: 92% 93% 91% 94%  Weight:      Height:       Physical Exam HENT:     Head: Normocephalic.  Eyes:     General: Lids are normal.     Conjunctiva/sclera: Conjunctivae normal.  Cardiovascular:     Rate and Rhythm: Normal rate and regular rhythm.     Heart sounds: Normal heart sounds, S1 normal and S2 normal.  Pulmonary:     Breath sounds: Examination of the right-middle field reveals decreased breath sounds. Examination of the right-lower field reveals decreased breath sounds and rhonchi. Examination of the left-lower field reveals decreased breath sounds. Decreased breath sounds and rhonchi present. No wheezing or rales.  Abdominal:     Palpations: Abdomen is soft.     Tenderness: There is no abdominal tenderness.  Musculoskeletal:     Right lower leg: No swelling.     Left lower leg: No swelling.  Skin:    General: Skin is warm.     Findings: No rash.  Neurological:     Mental Status: He is alert.     Data Reviewed: Blood cultures negative for 2 days, creatinine 1.01, potassium 3.6, white blood cell count 8.8, hemoglobin 12.6  Family Communication: Family at bedside  Disposition: Status is: Inpatient Remains inpatient appropriate because: Patient with acute delirium.  Continue treatment for pneumonia.  Continue 15 L high flow nasal cannula and try to taper.  Planned Discharge Destination: Rehab    Time spent: 28 minutes  Author: Charlie Patterson, MD 10/01/2023 11:41 AM  For on call review [email or web address].

## 2023-10-01 NOTE — Assessment & Plan Note (Addendum)
 Will start Seroquel  at night and as needed Haldol .  This morning patient was able to be redirected and distracted.  Familiar faces at bedside with family likely helping.

## 2023-10-01 NOTE — Consult Note (Signed)
 Consultation Note Date: 10/01/2023 at 1515  Patient Name: Cristian Martin  DOB: 28-Jan-1946  MRN: [id number]  Age / Sex: 78 y.o., male  [facility] Referring Physician: Josette Ade, MD  HPI/Patient Profile: 78 y.o. male  with past medical history significant for CAD s/p CABG, HTN, HFpEF, GERD, DM II, HLD and peripheral neuropathy. Patient presented to ED 09/29/2023 c/o AMS, fever and N/V. EMS reports 82% O2 on RA and temp 102.6. He was placed on NRB and transported to ED. On arrival, pt was A&O, stating he was okay when he went to bed but unable to tell what happened overnight.   ED labs significant for WBC 2.9, lactic acid 1.6, K+ 3.0, glucose 64. CXR showed R midlung opacity concerning for PNA. Respiratory panel negative. Pt required HHFNC 15 L with SpO2 in the upper 90s.  TRH was consulted for admission and management of acute hypoxemic respiratory failure and community-acquired pneumonia versus aspiration pneumonia.  Palliative was consulted to assist with goals of care conversations.   Clinical Assessment and Goals of Care: Extensive chart review completed prior to meeting patient including labs, vital signs, imaging, progress notes, orders, and available advanced directive documents from current and previous encounters. I then met with patient's wife, daughter, son and grandson to discuss diagnosis prognosis, GOC, EOL wishes, disposition and options.  Ill-appearing, elderly male resting in bed with sitter at bedside.  He awakens to verbal/tactile stimuli.  He is able to state he is not in pain and feels good today.  He immediately falls back to sleep and is unable to answer any further questions.  Sitter advises patient recently received medication for agitation.  Respirations are even and unlabored.  He is in no distress.  I introduced Palliative Medicine as specialized medical care for  people living with serious illness. It focuses on providing relief from the symptoms and stress of a serious illness. The goal is to improve quality of life for both the patient and the family.  We discussed a brief life review of the patient.  Cristian Martin has been married to his wife Cristian Martin for over 50 years.  They have 2 children, Cristian Martin and Cristian Martin, and 2 grandchildren.  Cristian Martin worked in a [employer]sell[employer].  After retirement he returned to working in [employer].  As far as functional and nutritional status Cristian Martin resides with his wife in their home.  She shares that he occasionally uses a walker or cane due to severe neuropathy.  She reports numerous falls over the past 6 months requiring multiple EMS visits but patient refusing care or transport.  Cristian Martin no longer drives.  Daughter shares he can eat when he has his dentures in.  Wife reports some weight loss during an undetermined amount of time.  She shares she feels this is due to depression.  We discussed patient's current illness and what it means in the larger context of patient's on-going co-morbidities.  Natural disease trajectory and expectations at EOL were  discussed.  Family understands patient is critically ill with high oxygen requirement and understand he could possibly require respiratory support if his condition were to worsen.  At this time family does want ventilator support short-term if needed.  I attempted to elicit values and goals of care important to the patient.  The most important goal to family at this time is for patient to be able to get better and return home.  The difference between aggressive medical intervention and comfort care was considered in light of the patient's goals of care.   Advance directives, concepts specific to code status, artificial feeding and hydration, and rehospitalization were considered and discussed.  When discussing CODE STATUS, family is currently not in  agreement concerning CPR.  They will discuss further and decide whether CODE STATUS should be changed.  Patient remains full CODE STATUS at this time.    As far as artificial nutrition, they are unsure about this now and would consider if it became necessary.  We discussed patient's current refusal to eat and possible need for nutritional support in the near future.  Education offered regarding concept specific to human mortality and the limitations of medical interventions to prolong life when the body begins to fail to thrive.  Family does describe significant decline in function, depressed mood and refusing to take medications at home.  Family is facing treatment option decisions, advanced directive, and anticipatory care needs.  Family is aware if the patient does not regain ability to make decisions, they will need to make decisions concerning goals of care.  Patient's wife and daughter voiced concern over significant signs of depression.  Cristian Martin shares that her husband stopped taking psychiatric medications a long time ago and feels this is contributed to his mood.  Dorise, son, shares he feels his father needs more socialization to be able to fight his depression.  Cristian Martin also shares that her husband has threatened to end his life at times.  She questions once he gets better could he receive psychiatric help.  Advised we would request psychiatric evaluation as needed.  Discussed with patient/family the importance of continued conversation with family and the medical providers regarding overall plan of care and treatment options, ensuring decisions are within the context of the patient's values and GOCs.  Family wants to reassess patient tomorrow to see if he is alert and oriented enough to make his wishes known.  They would prefer he make his decisions about his care.  Questions and concerns were addressed. The family was encouraged to call with questions or concerns.   Primary Decision Maker NEXT  OF KIN-Cristian Martin-spouse  Physical Exam Vitals reviewed.  Constitutional:      General: He is not in acute distress.    Appearance: He is ill-appearing.     Comments: Frail  HENT:     Head: Normocephalic and atraumatic.     Mouth/Throat:     Mouth: Mucous membranes are dry.  Pulmonary:     Effort: Pulmonary effort is normal. No respiratory distress.  Skin:    General: Skin is warm and dry.  Neurological:     Mental Status: He is disoriented.   Recommendations/Plan: FULL CODE status as previously documented    Continue current supportive interventions PMT will follow for continuing goals of care conversations with family  Palliative Assessment/Data: 40%     Thank you for this consult. Palliative medicine will continue to follow and assist holistically.   Time Total: 90 minutes  Time spent includes: Detailed  review of medical records (labs, imaging, vital signs), medically appropriate exam (mental status, respiratory, cardiac, skin), discussed with treatment team, counseling and educating patient, family and staff, documenting clinical information, medication management and coordination of care.     Devere Sacks, AMANDA [facility] Palliative Medicine Team  10/01/2023 5:19 PM  Office [phone]  Pager [phone]     Please contact Palliative Medicine Team providers via AMION for questions and concerns.

## 2023-10-01 NOTE — Plan of Care (Signed)
 Morning Dr. GLENWOOD Sorry to hit you right away, but per report VERY difficult night. Patient pulled out x3 IV, purwick multiple times, aggressive verbally and attempting physical. Sitter now in place. Sttill at 15L, barely hanging onto 90%, Everytime [facility] removed - immediately in the 70s. Dr. Informed.  Rounded first on list.  ABGs ordered and drawn.

## 2023-10-01 NOTE — NC FL2 (Signed)
 Cristian Martin  MEDICAID [id number] LEVEL OF CARE FORM     IDENTIFICATION  Patient Name: Cristian Martin Birthdate: December 09, 1945 Sex: male Admission Date (Current Location): 09/29/2023  [place] and [place] Number:  Chiropodist and Address:  [facility], [address], [place], [place] [address]      Provider Number: [id number]  Attending Physician Name and Address:  Josette Ade, MD  Relative Name and Phone Number:  Zada Jaeger [phone]    Current Level of Care: Hospital Recommended Level of Care: Skilled Nursing Facility Prior Approval Number:    Date Approved/Denied: 10/01/23 PASRR Number: [id number] ([id number])  Discharge Plan: SNF    Current Diagnoses: Patient Active Problem List   Diagnosis Date Noted   Severe sepsis (HCC) 09/30/2023   Lobar pneumonia (HCC) 09/30/2023   Hypoglycemia level 3 09/30/2023   Hypokalemia 09/30/2023   Hypomagnesemia 09/30/2023   Acute hypoxemic respiratory failure (HCC) 09/29/2023   Lumbar radiculopathy 10/13/2022   Lumbar stenosis with neurogenic claudication 10/13/2022   Paresthesia of both feet 04/09/2021   Spinal stenosis, lumbar region, with neurogenic claudication 03/03/2021   Chronic radicular lumbar pain 03/03/2021   Lumbar degenerative disc disease 03/03/2021   Lumbar facet arthropathy 03/03/2021   Chronic pain syndrome 03/03/2021   Iron deficiency anemia    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   Coronary artery disease 04/30/2019   Unstable angina (HCC) 04/27/2019   Malignant neoplasm of prostate (HCC) 04/09/2018   GERD (gastroesophageal reflux disease) 04/07/2018   Hx of colonic polyps 04/07/2018   Benign essential hypertension 12/02/2015   Hyperlipidemia, mixed 05/02/2015   Chronic painful diabetic neuropathy (HCC) 03/13/2015   DM (diabetes mellitus) type II controlled with renal manifestation (HCC) 08/08/2013    Orientation RESPIRATION BLADDER Height & Weight     Self, Time, Situation, Place  O2  (15L) Continent Weight: 181 lb 10.5 oz (82.4 kg) Height:  5' 11 (180.3 cm)  BEHAVIORAL SYMPTOMS/MOOD NEUROLOGICAL BOWEL NUTRITION STATUS     (Altered Mental Status on admission) Continent Diet (DIET SOFT Room service appropriate? Yes; Fluid consistency: Thin: Bland starting at 08/29 1436)  AMBULATORY STATUS COMMUNICATION OF NEEDS Skin   Extensive Assist Verbally Other (Comment) (Dry, Intact)                       Personal Care Assistance Level of Assistance  Bathing, Feeding, Dressing Bathing Assistance: Limited assistance Feeding assistance: Limited assistance Dressing Assistance: Limited assistance     Functional Limitations Info  Sight, Hearing, Speech          SPECIAL CARE FACTORS FREQUENCY  PT (By licensed PT), OT (By licensed OT)     PT Frequency: 2X OT Frequency: 2X            Contractures Contractures Info: Not present    Additional Factors Info  Code Status, Allergies Code Status Info: FULL Allergies Info: Ace Inhibitors           Current Medications (10/01/2023):  This is the current hospital active medication list Current Facility-Administered Medications  Medication Dose Route Frequency Provider Last Rate Last Admin   acetaminophen  (TYLENOL ) tablet 650 mg  650 mg Oral Q6H PRN Paudel, Nena, MD       Or   acetaminophen  (TYLENOL ) suppository 650 mg  650 mg Rectal Q6H PRN Paudel, Nena, MD       aspirin  EC tablet 81 mg  81 mg Oral Daily Paudel, Keshab, MD   81 mg at 09/30/23 [phone]  azithromycin  (ZITHROMAX ) 500 mg in sodium chloride  0.9 % 250 mL IVPB  500 mg Intravenous Q24H Paudel, Keshab, MD 250 mL/hr at 09/30/23 1314 500 mg at 09/30/23 1314   carvedilol  (COREG ) tablet 12.5 mg  12.5 mg Oral BID WC Paudel, Nena, MD   12.5 mg at 09/30/23 1701   cefTRIAXone  (ROCEPHIN ) 2 g in sodium chloride  0.9 % 100 mL IVPB  2 g Intravenous Q24H Wieting, Richard, MD       furosemide  (LASIX ) tablet 20 mg  20 mg Oral Daily Wieting, Richard, MD       haloperidol   lactate (HALDOL ) injection 1 mg  1 mg Intravenous Q6H PRN Josette Ade, MD       insulin  aspart (novoLOG ) injection 0-5 Units  0-5 Units Subcutaneous QHS Paudel, Nena, MD       insulin  aspart (novoLOG ) injection 0-6 Units  0-6 Units Subcutaneous TID WC Paudel, Keshab, MD   2 Units at 09/30/23 1701   ipratropium-albuterol  (DUONEB) 0.5-2.5 (3) MG/3ML nebulizer solution 3 mL  3 mL Nebulization QID Josette Ade, MD   3 mL at 10/01/23 9176   lidocaine  (LIDODERM ) 5 % 1 patch  1 patch Transdermal Q24H Paudel, Keshab, MD   1 patch at 09/30/23 9185   ondansetron  (ZOFRAN ) tablet 4 mg  4 mg Oral Q6H PRN Paudel, Keshab, MD       Or   ondansetron  (ZOFRAN ) injection 4 mg  4 mg Intravenous Q6H PRN Paudel, Nena, MD       pantoprazole  (PROTONIX ) injection 40 mg  40 mg Intravenous Q supper Paudel, Keshab, MD   40 mg at 09/30/23 1701   polyethylene glycol (MIRALAX  / GLYCOLAX ) packet 17 g  17 g Oral Daily PRN Paudel, Keshab, MD       QUEtiapine  (SEROQUEL ) tablet 25 mg  25 mg Oral QHS Wieting, Richard, MD       rosuvastatin  (CRESTOR ) tablet 20 mg  20 mg Oral Daily Paudel, Keshab, MD   20 mg at 09/30/23 0813   tamsulosin  (FLOMAX ) capsule 0.4 mg  0.4 mg Oral QHS Paudel, Keshab, MD   0.4 mg at 09/30/23 2126     Discharge Medications: Please see discharge summary for a list of discharge medications.  Relevant Imaging Results:  Relevant Lab Results:   Additional Information [id number]  Cristian L [name], LCSW

## 2023-10-01 NOTE — Plan of Care (Signed)

## 2023-10-02 DIAGNOSIS — J189 Pneumonia, unspecified organism: Secondary | ICD-10-CM | POA: Diagnosis not present

## 2023-10-02 DIAGNOSIS — E1165 Type 2 diabetes mellitus with hyperglycemia: Secondary | ICD-10-CM

## 2023-10-02 DIAGNOSIS — F3289 Other specified depressive episodes: Secondary | ICD-10-CM

## 2023-10-02 DIAGNOSIS — A419 Sepsis, unspecified organism: Secondary | ICD-10-CM | POA: Diagnosis not present

## 2023-10-02 DIAGNOSIS — J9601 Acute respiratory failure with hypoxia: Secondary | ICD-10-CM | POA: Diagnosis not present

## 2023-10-02 DIAGNOSIS — F32A Depression, unspecified: Secondary | ICD-10-CM | POA: Insufficient documentation

## 2023-10-02 DIAGNOSIS — J181 Lobar pneumonia, unspecified organism: Secondary | ICD-10-CM | POA: Diagnosis not present

## 2023-10-02 DIAGNOSIS — Z515 Encounter for palliative care: Secondary | ICD-10-CM | POA: Diagnosis not present

## 2023-10-02 DIAGNOSIS — Z7189 Other specified counseling: Secondary | ICD-10-CM | POA: Diagnosis not present

## 2023-10-02 DIAGNOSIS — Z794 Long term (current) use of insulin: Secondary | ICD-10-CM

## 2023-10-02 DIAGNOSIS — R410 Disorientation, unspecified: Secondary | ICD-10-CM | POA: Diagnosis not present

## 2023-10-02 LAB — BASIC METABOLIC PANEL WITH GFR
Anion gap: 12 (ref 5–15)
BUN: 22 mg/dL (ref 8–23)
CO2: 24 mmol/L (ref 22–32)
Calcium: 8.7 mg/dL — ABNORMAL LOW (ref 8.9–10.3)
Chloride: 105 mmol/L (ref 98–111)
Creatinine, Ser: 0.85 mg/dL (ref 0.61–1.24)
GFR, Estimated: >60 mL/min (ref >60)
Glucose, Bld: 158 mg/dL — ABNORMAL HIGH (ref 70–99)
Potassium: 3.6 mmol/L (ref 3.5–5.1)
Sodium: 141 mmol/L (ref 135–145)

## 2023-10-02 LAB — CBC
HCT: 38.6 % — ABNORMAL LOW (ref 39.0–52.0)
Hemoglobin: 12.5 g/dL — ABNORMAL LOW (ref 13.0–17.0)
MCH: 28 pg (ref 26.0–34.0)
MCHC: 32.4 g/dL (ref 30.0–36.0)
MCV: 86.5 fL (ref 80.0–100.0)
Platelets: 256 K/uL (ref 150–400)
RBC: 4.46 MIL/uL (ref 4.22–5.81)
RDW: 13.8 % (ref 11.5–15.5)
WBC: 7.9 K/uL (ref 4.0–10.5)
nRBC: 0 % (ref 0.0–0.2)

## 2023-10-02 LAB — GLUCOSE, CAPILLARY
Glucose-Capillary: 182 mg/dL — ABNORMAL HIGH (ref 70–99)
Glucose-Capillary: 489 mg/dL — ABNORMAL HIGH (ref 70–99)
Glucose-Capillary: 349 mg/dL — ABNORMAL HIGH (ref 70–99)
Glucose-Capillary: 372 mg/dL — ABNORMAL HIGH (ref 70–99)
Glucose-Capillary: 349 mg/dL — ABNORMAL HIGH (ref 70–99)

## 2023-10-02 LAB — HEMOGLOBIN A1C
Hgb A1c MFr Bld: 13.4 % — ABNORMAL HIGH (ref 4.8–5.6)
Mean Plasma Glucose: 338 mg/dL

## 2023-10-02 LAB — MAGNESIUM: Magnesium: 2.1 mg/dL (ref 1.7–2.4)

## 2023-10-02 MED ORDER — INSULIN GLARGINE 100 UNIT/ML ~~LOC~~ SOLN
12.0000 [IU] | Freq: Every day | SUBCUTANEOUS | Status: DC
  Administered 2023-10-02: 12 [IU] via SUBCUTANEOUS
  Filled 2023-10-02 (×2): qty 0.12

## 2023-10-02 MED ORDER — AZITHROMYCIN 250 MG PO TABS
500.0000 mg | ORAL_TABLET | Freq: Every day | ORAL | Status: AC
  Administered 2023-10-02 – 2023-10-03 (×2): 500 mg via ORAL
  Filled 2023-10-02 (×2): qty 2

## 2023-10-02 MED ORDER — PANTOPRAZOLE SODIUM 40 MG PO TBEC
40.0000 mg | DELAYED_RELEASE_TABLET | Freq: Every day | ORAL | Status: DC
  Administered 2023-10-02 – 2023-10-13 (×12): 40 mg via ORAL
  Filled 2023-10-02 (×12): qty 1

## 2023-10-02 NOTE — Assessment & Plan Note (Addendum)
 Psychiatry restarted Cymbalta .  No safety issues noted by psychiatry.

## 2023-10-02 NOTE — Progress Notes (Signed)
 Palliative Care Progress Note, Assessment & Plan   Patient Name: Cristian Martin       Date: 10/02/2023 DOB: [birth year]  Age: 78 y.o. MRN#: [id number] Attending Physician: Josette Ade, MD [facility] Physician: [facility] Admit Date: 09/29/2023  Subjective: Feels much better. Slept well. Denies pain. Reports eating 100% of breakfast and ice cream because today is his birthday. Denies CP/SOB. Patient shares he realizes how sick he has been.   HPI: 78 y.o. male  with past medical history significant for CAD s/p CABG, HTN, HFpEF, GERD, DM II, HLD and peripheral neuropathy. Patient presented to ED 09/29/2023 c/o AMS, fever and N/V. EMS reports 82% O2 on RA and temp 102.6. He was placed on NRB and transported to ED. On arrival, pt was A&O, stating he was okay when he went to bed but unable to tell what happened overnight.    ED labs significant for WBC 2.9, lactic acid 1.6, K+ 3.0, glucose 64. CXR showed R midlung opacity concerning for PNA. Respiratory panel negative. Pt required HHFNC 15 L with SpO2 in the upper 90s.   TRH was consulted for admission and management of acute hypoxemic respiratory failure and community-acquired pneumonia versus aspiration pneumonia.   Palliative was consulted to assist with goals of care conversations.    Summary of counseling/coordination of care: Extensive chart review completed prior to meeting patient including labs, vital signs, imaging, progress notes, orders, and available advanced directive documents from current and previous encounters.   After reviewing the patient's chart and assessing the patient at bedside, I spoke with patient in regards to symptom management and goals of care.   Ill-appearing, pleasant elderly male lying in bed with family at  bedside. He is able to participate in conversation. He appears sleepy this morning. He is alert to self, can name all family members correctly. He is oriented to time, location, situation and current president. He is still requiring high flow oxygen to maintain saturations in the upper 90's. Respirations are even and unlabored. He is in no distress.   After visit, patient accompanies me into hall. She asks if I will talk to her father about his wished since he is more alert today. Advised since he is still sleepy, goals of care would be better addressed tomorrow. Patients wife joins conversation with concern about wanting her husband to have psychiatric assessment while in the hospital for depression and previous threats for self harm. Advised family that all weapons should be removed from home while patient is here. Also encouraged daughter to contact [employer] senior resources for her father since family feels that he is social and likes to be around other people his age.   Wife also shares concerns that patient's insurance will not cover his medication. She requests that his insurance for SNF be changed to his [facility] benefit coverage.  Message sent to [place] to contact family in regards to insurance coverage. [place] shares that she will address when patient is closer to discharge.   Therapeutic silence and active listening provided for family and patient to share their thoughts and emotions regarding current medical situation.  Emotional support provided.  Physical Exam Vitals reviewed.  Constitutional:  General: He is not in acute distress.    Appearance: Normal appearance. He is ill-appearing.     Comments: Frail  HENT:     Head: Normocephalic and atraumatic.     Mouth/Throat:     Mouth: Mucous membranes are dry.  Pulmonary:     Effort: Pulmonary effort is normal. No respiratory distress.     Comments: 9L O2 via [place] Musculoskeletal:     Right lower leg: No edema.     Left lower leg:  No edema.  Skin:    General: Skin is warm and dry.  Neurological:     Mental Status: He is alert and oriented to person, place, and time.  Psychiatric:        Mood and Affect: Mood normal.        Behavior: Behavior normal.        Thought Content: Thought content normal.        Judgment: Judgment normal.    Recommendations/Plan: FULL CODE status as previously documented    Continue current supportive interventions PMT will follow for continuing goals of care conversations with family         Total Time 50 minutes   Time spent includes: Detailed review of medical records (labs, imaging, vital signs), medically appropriate exam (mental status, respiratory, cardiac, skin), discussed with treatment team, counseling and educating patient, family and staff, documenting clinical information, medication management and coordination of care.     Devere Sacks, ELNITA- [facility] Palliative Medicine Team  10/02/2023 8:40 AM  Office [phone]  Pager [phone]

## 2023-10-02 NOTE — Plan of Care (Signed)

## 2023-10-02 NOTE — Progress Notes (Signed)
 Progress Note   Patient: Cristian Martin FMW:[id number] DOB: 06/30/1945 DOA: 09/29/2023     3 DOS: the patient was seen and examined on 10/02/2023   Brief hospital course: 78 y.o. male with medical history significant for CAD s/p CABG HFpEF, HLD, GERD, HTN, DM on insulin , anxiety who presented to ED after altered mental status, fever, nausea and vomiting.  EMS was called out by the patient's wife who last saw the patient normal last night.  This morning she found the patient to be very somnolent, less responsive, covered with vomitus which presumably happened overnight.  EMS states roommate air saturation was 82% with no baseline oxygen requirement, placed the patient on nonrebreather found to have a 102.6 F temperature, transported to emergency room for further evaluation. Patient was seen and examined at bedside in the emergency room.  Patient was alert awake and oriented.  He stated that he was okay while he went to the bed last night.  But he was not able to tell me what happened in the night and this morning.  He was able to tell me he is in the hospital and he was able to tell me the date, year and the month.   ED Course: Upon arrival to the ED, patient is found to be alert awake, was able to tell his name but unable to tell where is he and what is the date.  Chest x-ray showed right midlung opacity concerning for pneumonia, leukopenia at 2.9, hypoxemia with normal lactic acid at 1.6.  Respiratory panel were negative patient required humidified high flow oxygen via nasal cannula at 15 L in upper 90s saturation.  Patient was given vancomycin  metronidazole  and cefepime  and 1 L of lactated Ringer 's bolus and hospitalist service was consulted for evaluation for admission for possible pneumonia and acute hypoxic respiratory failure  8/29.  Called this morning for hypoglycemia.  Patient was mentating and following commands.  Sugar confirmed low on lab draw and was replaced.  Patient received Lantus  20  units last night.  Normally takes 70/30 insulin  60 units twice a day.  Patient admitted with acute respiratory failure on 15 L high flow nasal cannula secondary to pneumonia right side. 8/30.  Patient had a rough night did not sleep pulled out numerous IVs and wanting to go home.  Called to see patient first.  Patient delirious.  Family at bedside.  Assessment and Plan: * Severe sepsis (HCC) Present on admission with fever, tachycardia, tachypnea, hypoxia (acute respiratory failure), leukopenia.  Patient has lobar pneumonia on right side.  Since MRSA PCR negative vancomycin  was discontinued.  Patient on Rocephin  and Zithromax .  Acute delirium Will start Seroquel  at night and as needed Haldol .  Mental status much better today with good night sleep last night.  Continue Seroquel .  Lobar pneumonia (HCC) Right-sided pneumonia.  Continue Rocephin  and Zithromax .  Since MRSA PCR negative vancomycin  discontinued.  Sputum culture, nebulizers and incentive spirometry.  Acute hypoxemic respiratory failure (HCC) Initial pulse ox 88% on nonrebreather mask.  Patient on 15 L high flow nasal cannula for 2 days.  I dialed down patient to 8 L high flow nasal cannula today.  Uncontrolled type 2 diabetes mellitus with hyperglycemia, with long-term current use of insulin  (HCC) Now with a sugar above 400 I will increase long-acting insulin  to 12 units at night and continue sliding scale.  Hypoglycemia level 3 Sugar 38 on the morning of 8/29.  Patient on sliding scale insulin .  Will increase Lantus  insulin  to 12  units at night with his sugar in the 400s today.  Depression Family requested a psychiatric consultation because he used to be on depression medications.  He has also threatened to kill himself and harm his wife.  Case discussed with psychiatry team.  Hypomagnesemia Replaced  Hypokalemia Replaced  Malignant neoplasm of prostate (HCC) Follow-up as outpatient  Hyperlipidemia, mixed On  Crestor   GERD (gastroesophageal reflux disease) On Protonix   Benign essential hypertension Continue Coreg  and Lasix         Subjective: Patient slept last night with Seroquel  and answering questions appropriately and following commands.  Patient admitted with acute respiratory failure and pneumonia.  Physical Exam: Vitals:   10/01/23 1601 10/01/23 1946 10/01/23 1951 10/02/23 0743  BP: (!) 98/58  (!) 105/58 (!) 151/83  Pulse: 87  87 86  Resp: (!) 25  20 (!) 22  Temp: 98.8 F (37.1 C)  98.3 F (36.8 C) 98.3 F (36.8 C)  TempSrc: Oral  Oral Oral  SpO2: 95% 94% 98%   Weight:      Height:       Physical Exam HENT:     Head: Normocephalic.  Eyes:     General: Lids are normal.     Conjunctiva/sclera: Conjunctivae normal.  Cardiovascular:     Rate and Rhythm: Normal rate and regular rhythm.     Heart sounds: Normal heart sounds, S1 normal and S2 normal.  Pulmonary:     Breath sounds: Examination of the right-middle field reveals decreased breath sounds. Examination of the right-lower field reveals decreased breath sounds and rhonchi. Examination of the left-lower field reveals decreased breath sounds. Decreased breath sounds and rhonchi present. No wheezing or rales.  Abdominal:     Palpations: Abdomen is soft.     Tenderness: There is no abdominal tenderness.  Musculoskeletal:     Right lower leg: No swelling.     Left lower leg: No swelling.  Skin:    General: Skin is warm.     Findings: No rash.  Neurological:     Mental Status: He is alert.     Data Reviewed: Glucose went up to 489, creatinine 0.85, white blood count 7.9, hemoglobin 12.5, platelet count 256  Family Communication: Family at bedside  Disposition: Status is: Inpatient Remains inpatient appropriate because: Patient still on high flow nasal cannula and dialed down from 10 down to 8L  Planned Discharge Destination: Home    Time spent: 28 minutes Case discussed with palliative  care  Author: Charlie Patterson, MD 10/02/2023 1:36 PM  For on call review [email or web address].

## 2023-10-02 NOTE — Assessment & Plan Note (Addendum)
 CBG within goal - Continue the dose of Lantus  to 25 unit - Continue mealtime coverage to 7 unit -Continue with current SSI

## 2023-10-03 DIAGNOSIS — R410 Disorientation, unspecified: Secondary | ICD-10-CM | POA: Diagnosis not present

## 2023-10-03 DIAGNOSIS — A419 Sepsis, unspecified organism: Secondary | ICD-10-CM | POA: Diagnosis not present

## 2023-10-03 DIAGNOSIS — J9601 Acute respiratory failure with hypoxia: Secondary | ICD-10-CM | POA: Diagnosis not present

## 2023-10-03 DIAGNOSIS — J181 Lobar pneumonia, unspecified organism: Secondary | ICD-10-CM | POA: Diagnosis not present

## 2023-10-03 DIAGNOSIS — F331 Major depressive disorder, recurrent, moderate: Secondary | ICD-10-CM | POA: Diagnosis not present

## 2023-10-03 DIAGNOSIS — R6520 Severe sepsis without septic shock: Secondary | ICD-10-CM | POA: Diagnosis not present

## 2023-10-03 LAB — GLUCOSE, CAPILLARY
Glucose-Capillary: 191 mg/dL — ABNORMAL HIGH (ref 70–99)
Glucose-Capillary: 212 mg/dL — ABNORMAL HIGH (ref 70–99)
Glucose-Capillary: 269 mg/dL — ABNORMAL HIGH (ref 70–99)
Glucose-Capillary: 286 mg/dL — ABNORMAL HIGH (ref 70–99)
Glucose-Capillary: 316 mg/dL — ABNORMAL HIGH (ref 70–99)
Glucose-Capillary: 369 mg/dL — ABNORMAL HIGH (ref 70–99)

## 2023-10-03 MED ORDER — INSULIN ASPART 100 UNIT/ML IJ SOLN
0.0000 [IU] | Freq: Three times a day (TID) | INTRAMUSCULAR | Status: DC
  Administered 2023-10-03: 7 [IU] via SUBCUTANEOUS
  Administered 2023-10-04: 2 [IU] via SUBCUTANEOUS
  Administered 2023-10-04 (×2): 7 [IU] via SUBCUTANEOUS
  Administered 2023-10-05: 5 [IU] via SUBCUTANEOUS
  Filled 2023-10-03 (×5): qty 1

## 2023-10-03 MED ORDER — ORAL CARE MOUTH RINSE: 15.0000 mL | OROMUCOSAL | Status: DC | PRN

## 2023-10-03 MED ORDER — DULOXETINE HCL 30 MG PO CPEP: 60.0000 mg | ORAL_CAPSULE | Freq: Every day | ORAL | Status: DC

## 2023-10-03 MED ORDER — DULOXETINE HCL 30 MG PO CPEP
30.0000 mg | ORAL_CAPSULE | Freq: Every day | ORAL | Status: AC
  Administered 2023-10-03 – 2023-10-05 (×3): 30 mg via ORAL
  Filled 2023-10-03 (×3): qty 1

## 2023-10-03 MED ORDER — INSULIN GLARGINE 100 UNIT/ML ~~LOC~~ SOLN
25.0000 [IU] | Freq: Every day | SUBCUTANEOUS | Status: DC
  Administered 2023-10-03 – 2023-10-04 (×2): 25 [IU] via SUBCUTANEOUS
  Filled 2023-10-03 (×2): qty 0.25

## 2023-10-03 MED ORDER — INSULIN GLARGINE 100 UNIT/ML ~~LOC~~ SOLN
17.0000 [IU] | Freq: Every day | SUBCUTANEOUS | Status: DC
  Filled 2023-10-03: qty 0.17

## 2023-10-03 MED ORDER — INSULIN ASPART 100 UNIT/ML IJ SOLN
2.0000 [IU] | Freq: Three times a day (TID) | INTRAMUSCULAR | Status: DC
  Administered 2023-10-03 – 2023-10-05 (×5): 2 [IU] via SUBCUTANEOUS
  Filled 2023-10-03 (×5): qty 1

## 2023-10-03 MED ORDER — DULOXETINE HCL 30 MG PO CPEP: 30.0000 mg | ORAL_CAPSULE | Freq: Every day | ORAL | Status: DC

## 2023-10-03 MED ORDER — DULOXETINE HCL 30 MG PO CPEP
60.0000 mg | ORAL_CAPSULE | Freq: Every day | ORAL | Status: DC
  Administered 2023-10-06 – 2023-10-14 (×9): 60 mg via ORAL
  Filled 2023-10-03 (×9): qty 2

## 2023-10-03 NOTE — Progress Notes (Signed)
 Progress Note   Patient: Cristian Martin FMW:[id number] DOB: 29-Feb-1945 DOA: 09/29/2023     4 DOS: the patient was seen and examined on 10/03/2023   Brief hospital course: 78 y.o. male with medical history significant for CAD s/p CABG HFpEF, HLD, GERD, HTN, DM on insulin , anxiety who presented to ED after altered mental status, fever, nausea and vomiting.  EMS was called out by the patient's wife who last saw the patient normal last night.  This morning she found the patient to be very somnolent, less responsive, covered with vomitus which presumably happened overnight.  EMS states roommate air saturation was 82% with no baseline oxygen requirement, placed the patient on nonrebreather found to have a 102.6 F temperature, transported to emergency room for further evaluation. Patient was seen and examined at bedside in the emergency room.  Patient was alert awake and oriented.  He stated that he was okay while he went to the bed last night.  But he was not able to tell me what happened in the night and this morning.  He was able to tell me he is in the hospital and he was able to tell me the date, year and the month.   ED Course: Upon arrival to the ED, patient is found to be alert awake, was able to tell his name but unable to tell where is he and what is the date.  Chest x-ray showed right midlung opacity concerning for pneumonia, leukopenia at 2.9, hypoxemia with normal lactic acid at 1.6.  Respiratory panel were negative patient required humidified high flow oxygen via nasal cannula at 15 L in upper 90s saturation.  Patient was given vancomycin  metronidazole  and cefepime  and 1 L of lactated Ringer 's bolus and hospitalist service was consulted for evaluation for admission for possible pneumonia and acute hypoxic respiratory failure  8/29.  Called this morning for hypoglycemia.  Patient was mentating and following commands.  Sugar confirmed low on lab draw and was replaced.  Patient received Lantus  20 units  last night.  Normally takes 70/30 insulin  60 units twice a day.  Patient admitted with acute respiratory failure on 15 L high flow nasal cannula secondary to pneumonia right side. 8/30.  Patient had a rough night did not sleep pulled out numerous IVs and wanting to go home.  Called to see patient first.  Patient delirious.  Family at bedside. 9/1.  Patient still on 10 L high flow nasal cannula.  Feeling better.  Sitting up eating lunch.  Pulmonary started steroids.  Assessment and Plan: * Severe sepsis (HCC) Present on admission with fever, tachycardia, tachypnea, hypoxia (acute respiratory failure), leukopenia.  Patient has lobar pneumonia on right side.  Since MRSA PCR negative vancomycin  was discontinued.  Patient on Rocephin  and completed Zithromax .  Pulmonary started steroids.  Acute delirium Will continue Seroquel  at night and as needed Haldol .  Mental status much better the last 2 days.  Lobar pneumonia (HCC) Right-sided pneumonia.  Continue Rocephin  and completed Zithromax .  Since MRSA PCR negative vancomycin  discontinued.  Sputum culture, nebulizers and incentive spirometry.  Acute hypoxemic respiratory failure (HCC) Initial pulse ox 88% on nonrebreather mask.  Patient on 15 L high flow nasal cannula for 2 days.  Patient on 10 L high flow nasal cannula.  Pulmonary started steroids.  Uncontrolled type 2 diabetes mellitus with hyperglycemia, with long-term current use of insulin  (HCC) With pulmonary and starting steroids I will increase Lantus  insulin  to 25 units in the evening and increase sliding scale and  add NovoLog  2 units prior to meals.  Hypoglycemia level 3 Sugar 38 on the morning of 8/29.    Depression Psychiatry restarted Cymbalta .  No safety issues noted by psychiatry.  Hypomagnesemia Replaced  Hypokalemia Replaced  Malignant neoplasm of prostate (HCC) Follow-up as outpatient  Hyperlipidemia, mixed On Crestor   GERD (gastroesophageal reflux disease) On  Protonix   Benign essential hypertension Continue Coreg  and Lasix         Subjective: Patient feeling better today.  Sitting up eating breakfast.  No problem swallowing.  Patient on high flow nasal cannula 10 L.  Admitted with sepsis and pneumonia  Physical Exam: Vitals:   10/03/23 0745 10/03/23 0813 10/03/23 0816 10/03/23 1022  BP: (!) 178/90   (!) 142/74  Pulse: 85   85  Resp: (!) 23   (!) 23  Temp:    (!) 97.2 F (36.2 C)  TempSrc:    Axillary  SpO2: 94% (!) 85% 91% 97%  Weight:      Height:       Physical Exam HENT:     Head: Normocephalic.  Eyes:     General: Lids are normal.     Conjunctiva/sclera: Conjunctivae normal.  Cardiovascular:     Rate and Rhythm: Normal rate and regular rhythm.     Heart sounds: Normal heart sounds, S1 normal and S2 normal.  Pulmonary:     Breath sounds: Examination of the right-lower field reveals decreased breath sounds. Examination of the left-lower field reveals decreased breath sounds. Decreased breath sounds present. No wheezing, rhonchi or rales.  Abdominal:     Palpations: Abdomen is soft.     Tenderness: There is no abdominal tenderness.  Musculoskeletal:     Right lower leg: No swelling.     Left lower leg: No swelling.  Skin:    General: Skin is warm.     Findings: No rash.  Neurological:     Mental Status: He is alert.     Data Reviewed: Last glucose 369  Family Communication: Updated patient's daughter on the phone  Disposition: Status is: Inpatient Remains inpatient appropriate because: Patient still on 10 L high flow nasal cannula.  Pulmonary starting steroids.  Continue IV Rocephin .  Planned Discharge Destination: Rehab    Time spent: 28 minutes  Author: Charlie Patterson, MD 10/03/2023 2:15 PM  For on call review [email or web address].

## 2023-10-03 NOTE — Consult Note (Signed)
 [facility] Psychiatric Consult Initial  Patient Name: .Cristian Martin  MRN: [id number]  DOB: 07-May-1945  Consult Order details:  Orders (From admission, onward)     Start     Ordered   10/02/23 1159  IP CONSULT TO PSYCHIATRY       Comments: Dr josette savoy  Ordering Provider: josette Ade, MD  Provider:  Debbie Donnice BRAVO, PA-C  Question Answer Comment  Location [facility]   Reason for Consult? depression, thoughts of hurting himself      10/02/23 1159             Mode of Visit: In person    Psychiatry Consult Evaluation  Service Date: October 03, 2023 LOS:  LOS: 4 days  Chief Complaint Depression    Primary Psychiatric Diagnoses  Major depressive disorder, recurrent, moderate    Assessment  Cristian Martin is a 78 y.o. male admitted: Medically Patient has a past medical history of diabetes, CAD with stents, CHF, GERD, hypertension, hyperlipidemia. He presented to the emergency department on 09/29/23 for altered mental status, fever, nausea, and vomiting. He is being treated for pneumonia and sepsis. He lives at home with his wife.  He has a son who lives in [place] and a daughter who lives near [place]. Psychiatry was consulted for safety evaluation.    On assessment, patient is alert and oriented x 4.  He denied depression, anxiety, or other psychiatric concerns at this time. He denied SI/HI/plan and denies hallucinations. Patient reports his physical health problems make him frustrated sometimes and currently reports feeling lot better. No indication of inpatient psychiatric admission at this time.   Diagnoses:  Active Hospital problems: Principal Problem:   Severe sepsis (HCC) Active Problems:   Benign essential hypertension   GERD (gastroesophageal reflux disease)   Hyperlipidemia, mixed   Malignant neoplasm of prostate (HCC)   S/P CABG x 3   Acute hypoxemic respiratory failure (HCC)   Lobar pneumonia (HCC)   Hypoglycemia  level 3   Hypokalemia   Hypomagnesemia   Acute delirium   Uncontrolled type 2 diabetes mellitus with hyperglycemia, with long-term current use of insulin  (HCC)   Depression    Plan   ## Psychiatric Medication Recommendations:  Restart Cymbalta  30 mg daily for 3 days, then increase to Cymbalta  60 mg once daily   ## Medical Decision Making Capacity: Not specifically addressed in this encounter  ## Further Work-up:   -- Pertinent labwork reviewed earlier this admission includes: Blood gas, BMP, CBC with differential, glucose, urinalysis   ## Disposition:-- There are no psychiatric contraindications to discharge at this time  ## Behavioral / Environmental: -Utilize compassion and acknowledge the patient's experiences while setting clear and realistic expectations for care.    ## Safety and Observation Level:  - Based on my clinical evaluation, I estimate the patient to be at low risk of self harm in the current setting. - At this time, we recommend  routine. This decision is based on my review of the chart including patient's history and current presentation, interview of the patient, mental status examination, and consideration of suicide risk including evaluating suicidal ideation, plan, intent, suicidal or self-harm behaviors, risk factors, and protective factors. This judgment is based on our ability to directly address suicide risk, implement suicide prevention strategies, and develop a safety plan while the patient is in the clinical setting. Please contact our team if there is a concern that risk level has changed.  CSSR Risk Category:C-SSRS RISK  CATEGORY: No Risk  Suicide Risk Assessment: Patient has following modifiable risk factors for suicide: access to guns and medication noncompliance, which we are addressing by restarting psychiatric medication and recommending removing guns from the home. Patient has following non-modifiable or demographic risk factors for suicide: male  gender Patient has the following protective factors against suicide: Access to outpatient mental health care, Supportive family, no history of suicide attempts, and no history of NSSIB  Thank you for this consult request. Recommendations have been communicated to the primary team.  We will sign off at this time.   Crystal LITTIE Lukes, PA-C       History of Present Illness  Relevant Aspects of [facility]   Patient Report:  On interview, patient is noted to be resting comfortably in bed and states he is feeling better overall.  He is alert and oriented x 4.  He denies anhedonia, feeling depressed, hopeless, or worthless.  He reports sleeping 6 hours per night.  He endorses improving energy level and good motivation.  He denies persistent anxiety, but reports some worry about finances and his health.  He denies panic attacks. He denies manic symptoms. He reports flashbacks related to his [employer], but denies nightmares. He denies SI/HI/plan and denies hallucinations. He is future oriented. He talks about a lot on his medical problems where he is unable to stand or walk and his difficulty with breathing when he walks around.He reports that at home he was bedridden and was unable to do much. He reports currently he improved a lot in the last 2 days and feels lot better.   Psych ROS:  Depression: denies  Anxiety:  denies  Mania (lifetime and current): denies  Psychosis: (lifetime and current): denies   Collateral information:  Contacted patient's wife, Raeqwon Lux at [phone] on 10/03/2023. Patient has a history of MDD and was taking Cymbalta  60 mg daily, however, patient's wife reported that patient stopped all of his medications at least one month ago. She also reports that patient has a history of making threats when he is angry or frustrated, saying he is going to shoot himself or wife. He has never gone to get his gun when he says these things and has no history of violence. She  reports patient sees a PCP at the [facility] but has not followed up with them in approximately one year. Patient's wife have no safety concerns and feels safe for patient to return home after medical stabilization. She is educate don gun safety at home, to put safety lock on it an dstore the ammunition separately.    Psychiatric and Social History  Psychiatric History:  Information collected from patient and wife, Rondarius Kadrmas.  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  Vital Signs:  Temp:  [97.2 F (36.2 C)-98.2 F (36.8 C)] 97.2 F (36.2 C) (09/01 1022) Pulse Rate:  [69-85] 85 (09/01 1022) Resp:  [17-23] 23 (09/01 1022) BP: (142-178)/(74-90) 142/74 (09/01 1022) SpO2:  [85 %-97 %] 97 % (09/01 1022) Blood pressure (!) 142/74, pulse 85, temperature (!) 97.2 F (36.2 C), temperature source Axillary, resp. rate (!) 23, height 5' 11 (1.803 m), weight 82.4 kg, SpO2 97%. Body mass index is 25.34 kg/m.    Mental Status Exam: General Appearance: Casual  Orientation:  Full (Time, Place, and Person)  Memory:  Immediate;   Fair Recent;   Fair Remote;   Fair  Concentration:   Concentration: Fair and Attention Span: Fair  Recall:  Fair  Attention  Fair  Eye Contact:  Fair  Speech:  Normal Rate  Language:  Good  Volume:  Decreased  Mood: Content   Affect:  Congruent  Thought Process:  Descriptions of Associations: Circumstantial  Thought Content:  Logical  Suicidal Thoughts:  No  Homicidal Thoughts:  No  Judgement:  Fair  Insight:  Fair  Psychomotor Activity:  Normal  Akathisia:  No  Fund of Knowledge:  Fair      Assets:  Solicitor Social Support  Cognition:  WNL  ADL's:  Intact  AIMS (if indicated):        Other History   These have been pulled in through the EMR, reviewed, and updated if appropriate.  Family History:  The patient's family history includes Diabetes in his father and sister; Hypertension in his father; Stroke in his mother.  Medical History: Past Medical History:  Diagnosis Date   Agent orange exposure    Anxiety    Ascending aorta dilatation (HCC) 2022   a.) TTE 2022: Ao root 41 mm (per cardiology notes); b.) TTE 09/09/2021: asc Ao 38 mm   Chronic painful diabetic neuropathy (HCC)    Coronary artery disease    a.) LHC 04/27/2019: 99% dLM-oLAD, 50% o-pLCx, 70% pRCA, 80% p-mLAD, 70% D2 --> CVTS referral; b.) s/p 3v CABG 04/30/2019   DDD (degenerative disc disease), cervical    Diastolic dysfunction    a.) TTE 04/27/2019: EF 55-60%, mod conc LVH, G1DD. Nl RV fxn. Mild Ao sclerosis w/o stenosis   Erectile dysfunction    a.) on intracavernous alprostadil injections   GERD (gastroesophageal reflux disease)    HLD (hyperlipidemia)    Hx of colonic polyps    Hyperlipidemia    Hyperplasia of prostate with lower urinary tract symptoms (LUTS)    Hypertension    Insomnia    a.) takes melatonin PRN   Iron deficiency anemia    Long term current use of aspirin     Lumbar degenerative disc disease    Lumbar facet arthropathy    Osteoarthritis    Paresthesia of both feet     Prostate cancer (HCC) 02/21/2018   a.) stage T1c; Gleason 3+4; b.) s/p I-125 brachytherapy   Recurrent falls    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   a.) LIMA-LAD, SVG-OM1, SVG-RCA   Spinal stenosis, lumbar region with neurogenic claudication    Type 2 diabetes mellitus treated with insulin  (HCC)    Unstable angina (HCC)    Wears glasses    wears to help double vision   Wears partial dentures (upper)     Surgical History: Past Surgical History:  Procedure Laterality Date   COLONOSCOPY WITH PROPOFOL  N/A 08/16/2019  Procedure: COLONOSCOPY WITH PROPOFOL ;  Surgeon: Unk Corinn Skiff, MD;  Location: [facility] ENDOSCOPY;  Service: Gastroenterology;  Laterality: N/A;   CORONARY ARTERY BYPASS GRAFT N/A 04/30/2019   Procedure: CORONARY ARTERY BYPASS GRAFTING (CABG) TIMES THREE USING LEFT INTERNAL MAMMARY AND RIGHT GREATER SAPHENOUS VEIN HARVESTED ENDOSCOPICALLY. MAMMARY ARTERY TO LAD, SAPHENOUS VEIN GRAFT TO  OM1, SAPHENOUS VEING GRAFT TO RIGHT. BIOPSY OF MEDIASTINAL TISSUE PLACEMENT OF RIGHT FEMORAL A-LINE;  Surgeon: Army Dallas NOVAK, MD;  Location: [facility] OR;  Service: Open Heart Surgery;  Laterality: N/A;   CYSTOSCOPY N/A 07/14/2018   Procedure: CYSTOSCOPY;  Surgeon: Ottelin, Mark, MD;  Location: [facility];  Service: Urology;  Laterality: N/A;  no seeds in bladder per Dr Ceil   ESOPHAGOGASTRODUODENOSCOPY (EGD) WITH PROPOFOL  N/A 08/16/2019   Procedure: ESOPHAGOGASTRODUODENOSCOPY (EGD) WITH PROPOFOL ;  Surgeon: Unk Corinn Skiff, MD;  Location[facility] ENDOSCOPY;  Service: Gastroenterology;  Laterality: N/A;   LEFT HEART CATH AND CORONARY ANGIOGRAPHY N/A 04/27/2019   Procedure: LEFT HEART CATH AND CORONARY ANGIOGRAPHY;  Surgeon: Darron Deatrice LABOR, MD;  Location: [facility] INVASIVE CV LAB;  Service: Cardiovascular;  Laterality: N/A;   LUMBAR LAMINECTOMY/DECOMPRESSION MICRODISCECTOMY N/A 10/13/2022   Procedure: L4-5 DECOMPRESSION;  Surgeon: Clois Fret, MD;  Location: [facility] ORS;  Service: Neurosurgery;   Laterality: N/A;   PROSTATE BIOPSY  02-21-2018  dr ceil in office   RADIOACTIVE SEED IMPLANT N/A 07/14/2018   Procedure: RADIOACTIVE SEED IMPLANT/BRACHYTHERAPY IMPLANT;  Surgeon: Ottelin, Mark, MD;  Location: [facility];  Service: Urology;  Laterality: N/A;  77 seeds    SHOULDER ARTHROSCOPY Left 2018   in [place]   remove spur   SPACE OAR INSTILLATION N/A 07/14/2018   Procedure: SPACE OAR INSTILLATION;  Surgeon: Ottelin, Mark, MD;  Location: [facility];  Service: Urology;  Laterality: N/A;   TEE WITHOUT CARDIOVERSION N/A 04/30/2019   Procedure: TRANSESOPHAGEAL ECHOCARDIOGRAM (TEE);  Surgeon: Army Dallas NOVAK, MD;  Location: [facility] OR;  Service: Open Heart Surgery;  Laterality: N/A;     Medications:   Current Facility-Administered Medications:    acetaminophen  (TYLENOL ) tablet 650 mg, 650 mg, Oral, Q6H PRN **OR** acetaminophen  (TYLENOL ) suppository 650 mg, 650 mg, Rectal, Q6H PRN, Paudel, Keshab, MD   aspirin  EC tablet 81 mg, 81 mg, Oral, Daily, Paudel, Keshab, MD, 81 mg at 10/03/23 9185   carvedilol  (COREG ) tablet 12.5 mg, 12.5 mg, Oral, BID WC, Paudel, Keshab, MD, 12.5 mg at 10/03/23 0815   cefTRIAXone  (ROCEPHIN ) 2 g in sodium chloride  0.9 % 100 mL IVPB, 2 g, Intravenous, Q24H, Wieting, Richard, MD, Last Rate: 200 mL/hr at 10/03/23 0823, 2 g at 10/03/23 9176   DULoxetine  (CYMBALTA ) DR capsule 30 mg, 30 mg, Oral, Daily **AND** DULoxetine  (CYMBALTA ) DR capsule 60 mg, 60 mg, Oral, Daily, Hunter, Crystal L, PA-C   furosemide  (LASIX ) tablet 20 mg, 20 mg, Oral, Daily, Wieting, Richard, MD, 20 mg at 10/03/23 0815   haloperidol  lactate (HALDOL ) injection 1 mg, 1 mg, Intravenous, Q6H PRN, Josette, Richard, MD, 1 mg at 10/01/23 2137   hydrocortisone  sodium succinate  (SOLU-CORTEF ) 100 MG injection 100 mg, 100 mg, Intravenous, Q12H, Assaker, Jean-Pierre, MD, 100 mg at 10/03/23 0331   insulin  aspart (novoLOG ) injection 0-5 Units, 0-5 Units, Subcutaneous, QHS, Paudel,  Keshab, MD, 5 Units at 10/02/23 2103   insulin  aspart (novoLOG ) injection 0-6 Units, 0-6 Units, Subcutaneous, TID WC, Paudel, Keshab, MD, 3 Units at 10/03/23 0816   insulin  glargine (LANTUS ) injection 12 Units, 12 Units, Subcutaneous, QHS, Josette Ade, MD, 12 Units  at 10/02/23 2104   ipratropium-albuterol  (DUONEB) 0.5-2.5 (3) MG/3ML nebulizer solution 3 mL, 3 mL, Nebulization, QID, Wieting, Richard, MD, 3 mL at 10/03/23 1130   lidocaine  (LIDODERM ) 5 % 1 patch, 1 patch, Transdermal, Q24H, Paudel, Keshab, MD, 1 patch at 10/03/23 0818   ondansetron  (ZOFRAN ) tablet 4 mg, 4 mg, Oral, Q6H PRN **OR** ondansetron  (ZOFRAN ) injection 4 mg, 4 mg, Intravenous, Q6H PRN, Paudel, Keshab, MD   Oral care mouth rinse, 15 mL, Mouth Rinse, PRN, Mansy, Jan A, MD   pantoprazole  (PROTONIX ) EC tablet 40 mg, 40 mg, Oral, Q supper, Chappell, Alex B, RPH, 40 mg at 10/02/23 1637   polyethylene glycol (MIRALAX  / GLYCOLAX ) packet 17 g, 17 g, Oral, Daily PRN, Paudel, Keshab, MD   QUEtiapine  (SEROQUEL ) tablet 25 mg, 25 mg, Oral, QHS, Wieting, Richard, MD, 25 mg at 10/02/23 2104   rosuvastatin  (CRESTOR ) tablet 20 mg, 20 mg, Oral, Daily, Paudel, Keshab, MD, 20 mg at 10/03/23 0815   tamsulosin  (FLOMAX ) capsule 0.4 mg, 0.4 mg, Oral, QHS, Paudel, Keshab, MD, 0.4 mg at 10/02/23 2104  Allergies: Allergies  Allergen Reactions   Ace Inhibitors Other (See Comments)    Hyperkalemia.    Crystal LITTIE Lukes, PA-C

## 2023-10-03 NOTE — Progress Notes (Addendum)
 Upon arrival Cristian Martin was sitting on side of bed, pleasant, and shared he had a MOST form completed earlier in the day (which was in his paper chart). He was unsure about an Scientist, water quality. Education was offered. He thinks he has one at home. Encouraged him to ask his family and ask for spiritual care to return if he needs additional support. He reports his wife and children will be his POA. Cristian Martin shared he did not realize how bad his illness has gotten. He continues to process his reality. At this time, he continues to believe he can get better. He requested prayer which was offered.    10/03/23 2000  Spiritual Encounters  Type of Visit Initial  Care provided to: Patient  Referral source Nurse (RN/NT/LPN)  Reason for visit Advance directives  OnCall Visit Yes

## 2023-10-03 NOTE — Inpatient Diabetes Management (Addendum)
 Inpatient Diabetes Program Recommendations  AACE/ADA: New Consensus Statement on Inpatient Glycemic Control (2015)  Target Ranges:  Prepandial:   less than 140 mg/dL      Peak postprandial:   less than 180 mg/dL (1-2 hours)      Critically ill patients:  140 - 180 mg/dL    Latest Reference Range & Units 09/29/23 09:18  Hemoglobin A1C 4.8 - 5.6 % 13.4 (H)  338 mg/dl  (H): Data is abnormally high  Latest Reference Range & Units 10/02/23 07:46 10/02/23 12:35 10/02/23 16:24 10/02/23 20:37  Glucose-Capillary 70 - 99 mg/dL 817 (H)  1 unit Novolog   489 (H)  6 units Novolog   349 (H)  5 units Novolog   372 (H)  5 units Novolog   12 units Lantus   (H): Data is abnormally high  Latest Reference Range & Units 10/03/23 07:43 10/03/23 11:50  Glucose-Capillary 70 - 99 mg/dL 730 (H)  3 units Novolog   369 (H)  5 units Novolog    (H): Data is abnormally high   Home DM Meds: Jardiance 12.5 mg QAM                             70/30 Insulin  60 units AM/ 50 units PM                             Metformin  1000 mg BID                             Ozempic  2 mg Qweek   Current Orders: Novolog  0-6 units TID AC + HS      Lantus  12 units QHS         MD- Note Lantus  increased to 12 units last PM Getting Solucortef 100 mg BID  If plan is to keep pt on IV Steroids today, please consider: Increase the strength and frequency of the Novolog  SSI to the 0-9 unit (sensitive scale) Q4 hours Can bump it back down if needed once steroids stopped  Also note pt eating well (100%).  Could also add low dose Novolog  Meal Coverage: Novolog  2 units TID with meals HOLD if pt NPO HOLD if pt eats <50% meals   Addendum 1:30pm--Met w/ pt at bedside.  He was awake and able to converse with me.  Pt told me he sees MD at the [facility] in [place].  Uses the Freestyle Libre 3 plus CGM at home.  Told me he cannot find the reader for the CGM.  Stated to me he takes 70/30 Insulin  60 units BID + Jardiance + Metformin  +  Ozempic  at home.  I reviewed his current A1c of 13.4% with pt--He told me his last A1c was 19% when checked at the [facility]?  Unsure if this is correct?  According to Chart Review, A1c at [facility] visit 09/21/2023 was 14%.  Pt gave me permission to call his wife with an update on his CBGs.  Called pt's wife and reviewed current CBGs, effect of steroids on pt's CBGs, In-hospital insulin  regimen.  Wife aware that A1c is elevated--She told me that pt has NOT been taking his insulin  or other meds and that the [facility] doctor fussed at pt at the last visit.  She also told me they still cannot find pt's reader for the Freestyle Libre 3 CGM.  I told wife I will ask the MD at the  hospital to give them a Rx for another reader, but I am unsure if the [facility] will cover another reader at this time.  Wife appreciative of info and call.  Talked with RN about the above info.  RN told me they are trying to see if pt can get CIR prior to d/c home.  When pt goes home, he may need supervision to make sure he is taking his insulin  as prescribed.      --Will follow patient during hospitalization--  Adina Rudolpho Arrow RN, MSN, CDCES Diabetes Coordinator Inpatient Glycemic Control Team Team Pager: [phone] (8a-5p)

## 2023-10-03 NOTE — Plan of Care (Signed)

## 2023-10-03 NOTE — Progress Notes (Addendum)
 Palliative Care Progress Note, Assessment & Plan   Patient Name: Cristian Martin       Date: 10/03/2023 DOB: 1945-11-17  Age: 78 y.o. MRN#: [id number] Attending Physician: Josette Ade, MD Primary Care Physician: [facility] Date: 09/29/2023  Subjective: Denies pain. Slept well last night. Reports eating 100% breakfast today. He is motivated for rehab so he will be able to go home.    HPI: 78 y.o. male  with past medical history significant for CAD s/p CABG, HTN, HFpEF, GERD, DM II, HLD and peripheral neuropathy. Patient presented to ED 09/29/2023 c/o AMS, fever and N/V. EMS reports 82% O2 on RA and temp 102.6. He was placed on NRB and transported to ED. On arrival, pt was A&O, stating he was okay when he went to bed but unable to tell what happened overnight.    ED labs significant for WBC 2.9, lactic acid 1.6, K+ 3.0, glucose 64. CXR showed R midlung opacity concerning for PNA. Respiratory panel negative. Pt required HHFNC 15 L with SpO2 in the upper 90s.   TRH was consulted for admission and management of acute hypoxemic respiratory failure and community-acquired pneumonia versus aspiration pneumonia.   Palliative was consulted to assist with goals of care conversations.  Summary of counseling/coordination of care: Extensive chart review completed prior to meeting patient including labs, vital signs, imaging, progress notes, orders, and available advanced directive documents from current and previous encounters.      Latest Ref Rng & Units 10/02/2023    5:44 AM 10/01/2023    7:17 AM 09/30/2023    6:45 AM  CBC  WBC 4.0 - 10.5 K/uL 7.9  8.8  8.2   Hemoglobin 13.0 - 17.0 g/dL 87.4  87.3  87.4   Hematocrit 39.0 - 52.0 % 38.6  38.2  39.2   Platelets 150 - 400 K/uL 256  215  205         Latest Ref Rng & Units 10/02/2023    5:44 AM 10/01/2023    7:17 AM 09/30/2023    8:53 AM  CMP  Glucose 70 - 99 mg/dL 841  821  39   BUN 8 - 23 mg/dL 22  18    Creatinine 9.38 - 1.24 mg/dL 9.14  8.98    Sodium 864 - 145 mmol/L 141  141    Potassium 3.5 - 5.1 mmol/L 3.6  3.6    Chloride 98 - 111 mmol/L 105  103    CO2 22 - 32 mmol/L 24  23    Calcium  8.9 - 10.3 mg/dL 8.7  8.6       After reviewing the patient's chart and assessing the patient at bedside, I spoke with patient in regards to symptom management and goals of care.   Ill-appearing, elderly male resting in bed with sister and brother-in-law at bedside. He is alert and oriented to self, time, location and situation. He is able to participate in conversation and make his wishes known. Respirations are even and unlabored. He is in no distress.   Discussed with patient code status and his wishes for care. He states he wants CPR, ventilator, trach and feeding tube if needed. He shares that he  realizes now how sick he has been. He wants a chance to get better and states he has family and grandchildren to raise. He wants to go to rehab to regain strength and go back home. MOST form completed and uploaded to EPIC, original placed in paper chart.   During visit, pt indicates in the event he were to become confused and unable to make decisions for himself, he would want his wife Dagoberto to be his first choice for surrogate decision maker followed by daughter Zada as second and then Dorise, son. Spiritual consult placed to assist with completion of HPOA documentation while he is admitted.   Contacted daughter, Zada, via phone to share her father's wishes. She shares that she is relieved that he was able to make his wishes known. She understands that it may be several days before patient would be able to transfer to rehab due to his significant oxygen requirement of 10 L. Zada is appreciative of information and expresses being thankful for call.    Therapeutic silence and active listening provided for patient/daughter to share their thoughts and emotions regarding current medical situation.  Emotional support provided.  Physical Exam Vitals reviewed.  Constitutional:      General: He is not in acute distress.    Appearance: Normal appearance. He is ill-appearing.  HENT:     Head: Normocephalic and atraumatic.  Pulmonary:     Effort: Pulmonary effort is normal. No respiratory distress.     Comments: 10 L HFNC Musculoskeletal:     Right lower leg: No edema.     Left lower leg: No edema.  Skin:    General: Skin is warm and dry.  Neurological:     Mental Status: He is alert and oriented to person, place, and time.  Psychiatric:        Mood and Affect: Mood normal.        Behavior: Behavior normal.        Thought Content: Thought content normal.        Judgment: Judgment normal.     Recommendations/Plan: FULL CODE status as previously documented   MOST form completed, uploaded via [employer] with original placed in chart Spiritual consult placed for completion of HPOA documentation   Continue current supportive interventions PMT will follow for peripherally for needs            Total Time 65 minutes   Time spent includes: Detailed review of medical records (labs, imaging, vital signs), medically appropriate exam (mental status, respiratory, cardiac, skin), discussed with treatment team, counseling and educating patient, family and staff, documenting clinical information, medication management, advanced care planning and coordination of care.     Devere Sacks, AMANDA [facility] Palliative Medicine Team  10/03/2023 9:44 AM  Office [phone]  Pager [phone]

## 2023-10-03 NOTE — TOC Progression Note (Signed)
 Transition of Care [facility]) - Progression Note    Patient Details  Name: Cristian Martin MRN: [id number] Date of Birth: [birth year]  Transition of Care [facility]) CM/SW Contact  Lorraine LILLETTE [place], [place] Phone Number: 10/03/2023, 2:04 PM  Clinical Narrative:     CSW received a [place] from RN, spouse has questions that nursing team could not answer, CSW agreed to reach out.  Call to spouse, she shared that family has [employer] benefits and [employer] benefits and she previously agreed that CIR/SNF could be billed with [employer], but as she looked at policy- they may not have prescription coverage with [facility] and she is concerned.  Spouse would like VA to cover the cost of care and CIR or SNF. Spouse concerned [employer] cost may be too much.   CSW agreed to document concern, reminding her that pt not clear for DC and she may want to have some conversation with VA as well.  ICM following.       Barriers to Discharge: Continued Medical Work up               Expected Discharge Plan and Services                                               Social Drivers of Health (SDOH) Interventions SDOH Screenings   Food Insecurity: No Food Insecurity (09/30/2023)  Housing: Low Risk  (09/30/2023)  Transportation Needs: No Transportation Needs (09/30/2023)  Utilities: Not At Risk (09/30/2023)  Depression (PHQ2-9): Medium Risk (08/23/2019)  Social Connections: Moderately Isolated (09/30/2023)  Tobacco Use: Low Risk  (09/29/2023)    Readmission Risk Interventions     No data to display

## 2023-10-04 DIAGNOSIS — J189 Pneumonia, unspecified organism: Secondary | ICD-10-CM | POA: Diagnosis not present

## 2023-10-04 DIAGNOSIS — J181 Lobar pneumonia, unspecified organism: Secondary | ICD-10-CM | POA: Diagnosis not present

## 2023-10-04 DIAGNOSIS — Z951 Presence of aortocoronary bypass graft: Secondary | ICD-10-CM | POA: Diagnosis not present

## 2023-10-04 DIAGNOSIS — R410 Disorientation, unspecified: Secondary | ICD-10-CM | POA: Diagnosis not present

## 2023-10-04 DIAGNOSIS — A419 Sepsis, unspecified organism: Secondary | ICD-10-CM | POA: Diagnosis not present

## 2023-10-04 DIAGNOSIS — N179 Acute kidney failure, unspecified: Secondary | ICD-10-CM | POA: Insufficient documentation

## 2023-10-04 DIAGNOSIS — J9601 Acute respiratory failure with hypoxia: Secondary | ICD-10-CM | POA: Diagnosis not present

## 2023-10-04 LAB — CULTURE, BLOOD (ROUTINE X 2)
Culture: NO GROWTH
Culture: NO GROWTH

## 2023-10-04 LAB — BASIC METABOLIC PANEL WITH GFR
Anion gap: 12 (ref 5–15)
BUN: 26 mg/dL — ABNORMAL HIGH (ref 8–23)
CO2: 29 mmol/L (ref 22–32)
Calcium: 8.7 mg/dL — ABNORMAL LOW (ref 8.9–10.3)
Chloride: 103 mmol/L (ref 98–111)
Creatinine, Ser: 1.23 mg/dL (ref 0.61–1.24)
GFR, Estimated: 60 mL/min — ABNORMAL LOW (ref >60)
Glucose, Bld: 162 mg/dL — ABNORMAL HIGH (ref 70–99)
Potassium: 3.2 mmol/L — ABNORMAL LOW (ref 3.5–5.1)
Sodium: 144 mmol/L (ref 135–145)

## 2023-10-04 LAB — CBC
HCT: 41 % (ref 39.0–52.0)
Hemoglobin: 13.2 g/dL (ref 13.0–17.0)
MCH: 28.3 pg (ref 26.0–34.0)
MCHC: 32.2 g/dL (ref 30.0–36.0)
MCV: 87.8 fL (ref 80.0–100.0)
Platelets: 296 K/uL (ref 150–400)
RBC: 4.67 MIL/uL (ref 4.22–5.81)
RDW: 13.7 % (ref 11.5–15.5)
WBC: 8.8 K/uL (ref 4.0–10.5)
nRBC: 0 % (ref 0.0–0.2)

## 2023-10-04 LAB — GLUCOSE, CAPILLARY
Glucose-Capillary: 195 mg/dL — ABNORMAL HIGH (ref 70–99)
Glucose-Capillary: 318 mg/dL — ABNORMAL HIGH (ref 70–99)
Glucose-Capillary: 350 mg/dL — ABNORMAL HIGH (ref 70–99)
Glucose-Capillary: 334 mg/dL — ABNORMAL HIGH (ref 70–99)

## 2023-10-04 MED ORDER — POTASSIUM CHLORIDE CRYS ER 20 MEQ PO TBCR
40.0000 meq | EXTENDED_RELEASE_TABLET | Freq: Once | ORAL | Status: AC
  Administered 2023-10-04: 40 meq via ORAL
  Filled 2023-10-04: qty 2

## 2023-10-04 MED ORDER — SODIUM CHLORIDE 0.9 % IV BOLUS
250.0000 mL | Freq: Once | INTRAVENOUS | Status: AC
  Administered 2023-10-04: 250 mL via INTRAVENOUS

## 2023-10-04 MED ORDER — IPRATROPIUM-ALBUTEROL 0.5-2.5 (3) MG/3ML IN SOLN
3.0000 mL | Freq: Two times a day (BID) | RESPIRATORY_TRACT | Status: DC
  Administered 2023-10-04 – 2023-10-06 (×5): 3 mL via RESPIRATORY_TRACT
  Filled 2023-10-04 (×5): qty 3

## 2023-10-04 NOTE — Progress Notes (Signed)
 Occupational Therapy Treatment Patient Details Name: Cristian Martin MRN: [id number] DOB: 04/15/1945 Today's Date: 10/04/2023   History of present illness Cristian Martin is a pleasant 78 y.o. male with medical history significant for CAD s/p CABG HFpEF, HLD, GERD, HTN, DM on insulin , anxiety who presented to ED after altered mental status, fever, nausea and vomiting. MD dx include: Acute hypoxemic respiratory failure likely due to pneumonia, congestive heart failure, HTN, diabetes, hypokalemia, and CAD.   OT comments  Chart reviewed to date, pt greeted semi supine in bed, asleep but agreeable to OT tx session targeting improving functional activity tolerance in prep for ADL tasks with encouragement. He is agitated at times and reports he is not getting much sleep but able to be redirected. Pt requires increased assist for transfers on this date, requiring MOD A for SPT. Attempted step pivot with RW however with STS pt has significant posterior lean on the bed and continues with lateral steps up the bed with RW requiring MIN A. MIN A required for feeding, progress to supervision. Pt is left in care of nurse, all needs met on 5L via HFNC with spo2 >90%. OT will continue to follow.       If plan is discharge home, recommend the following:  A lot of help with walking and/or transfers;A lot of help with bathing/dressing/bathroom   Equipment Recommendations  BSC/3in1    Recommendations for Other Services      Precautions / Restrictions Precautions Precautions: Fall Recall of Precautions/Restrictions: Impaired Restrictions Weight Bearing Restrictions Per Provider Order: No       Mobility Bed Mobility Overal bed mobility: Needs Assistance Bed Mobility: Sit to Supine     Supine to sit: Mod assist, HOB elevated          Transfers Overall transfer level: Needs assistance Equipment used: Rolling walker (2 wheels) Transfers: Sit to/from Stand Sit to Stand: Mod assist                  Balance Overall balance assessment: Needs assistance Sitting-balance support: Feet supported, Single extremity supported Sitting balance-Leahy Scale: Fair   Postural control: Posterior lean Standing balance support: Bilateral upper extremity supported, During functional activity, Reliant on assistive device for balance Standing balance-Leahy Scale: Poor                             ADL either performed or assessed with clinical judgement   ADL Overall ADL's : Needs assistance/impaired Eating/Feeding: Minimal assistance;Sitting                   Lower Body Dressing: Maximal assistance Lower Body Dressing Details (indicate cue type and reason): donn socks Toilet Transfer: Moderate assistance;Cueing for sequencing;Cueing for safety Toilet Transfer Details (indicate cue type and reason): simulated, SPT to bedside chair after Step pivot deemed unsafe to to significant posterior lateral lean against the bed                Extremity/Trunk Assessment              Vision       Perception     Praxis     Communication Communication Communication: No apparent difficulties   Cognition Arousal: Alert Behavior During Therapy: Agitated Cognition: Cognition impaired   Orientation impairments: Situation     Attention impairment (select first level of impairment): Sustained attention Executive functioning impairment (select all impairments): Problem solving  Following commands: Impaired Following commands impaired: Follows one step commands with increased time      Cueing   Cueing Techniques: Verbal cues, Tactile cues, Visual cues  Exercises Other Exercises Other Exercises: edu re role of OT, role of rehab, discharge recommendations    Shoulder Instructions       General Comments spo2 >90% on 7 L via HFNC, down to 5L via HFNC with nurse in room when this therapist left    Pertinent Vitals/ Pain       Pain  Assessment Pain Assessment: No/denies pain  Home Living                                          Prior Functioning/Environment              Frequency  Min 2X/week        Progress Toward Goals  OT Goals(current goals can now be found in the care plan section)  Progress towards OT goals: Progressing toward goals  Acute Rehab OT Goals Time For Goal Achievement: 10/14/23  Plan      Co-evaluation                 AM-PAC OT 6 Clicks Daily Activity     Outcome Measure   Help from another person eating meals?: None Help from another person taking care of personal grooming?: A Little Help from another person toileting, which includes using toliet, bedpan, or urinal?: A Lot Help from another person bathing (including washing, rinsing, drying)?: A Lot Help from another person to put on and taking off regular upper body clothing?: A Little Help from another person to put on and taking off regular lower body clothing?: A Lot 6 Click Score: 16    End of Session Equipment Utilized During Treatment: Oxygen;Rolling walker (2 wheels)  OT Visit Diagnosis: Other abnormalities of gait and mobility (R26.89);Muscle weakness (generalized) (M62.81)   Activity Tolerance Patient tolerated treatment well   Patient Left with call bell/phone within reach;in chair;with chair alarm set;with nursing/sitter in room   Nurse Communication Mobility status        Time: 9055-8993 OT Time Calculation (min): 22 min  Charges: OT General Charges $OT Visit: 1 Visit OT Treatments $Self Care/Home Management : 8-22 mins  Therisa Sheffield, OTD OTR/L  10/04/23, 10:28 AM

## 2023-10-04 NOTE — Progress Notes (Signed)
 Physical Therapy Treatment Patient Details Name: Cristian Martin MRN: [id number] DOB: 1945-12-19 Today's Date: 10/04/2023   History of Present Illness Cristian Martin is a pleasant 78 y.o. male with medical history significant for CAD s/p CABG HFpEF, HLD, GERD, HTN, DM on insulin , anxiety who presented to ED after altered mental status, fever, nausea and vomiting. MD dx include: Acute hypoxemic respiratory failure likely due to pneumonia, congestive heart failure, HTN, diabetes, hypokalemia, and CAD.    PT Comments  Pt resting in bed upon PT arrival; pt agreeable to therapy; pt's wife present beginning of session but left beginning of session.  During session pt was mod assist with bed mobility; min to mod assist for transfers using RW; and CGA to min assist to take steps in place with RW use.  SpO2 sats 92% or greater on 6 L supplemental O2 via nasal cannula and HR 79-105 bpm during sessions activities.  Performed x5 sit to stands (with RW use) focusing on improving overall technique and weight shift forward d/t posterior lean.  Will continue to focus on strengthening, balance, and progressive functional mobility during hospitalization.    If plan is discharge home, recommend the following: A lot of help with walking and/or transfers;A lot of help with bathing/dressing/bathroom;Assistance with cooking/housework;Assist for transportation;Help with stairs or ramp for entrance   Can travel by private vehicle     No  Equipment Recommendations  Other (comment) (TBD at next facility)    Recommendations for Other Services       Precautions / Restrictions Precautions Precautions: Fall Recall of Precautions/Restrictions: Impaired Restrictions Weight Bearing Restrictions Per Provider Order: No     Mobility  Bed Mobility Overal bed mobility: Needs Assistance Bed Mobility: Supine to Sit, Sit to Supine     Supine to sit: Mod assist, HOB elevated (assist for trunk) Sit to supine: Mod assist, HOB  elevated (assist for B LE's)   General bed mobility comments: vc's for technique    Transfers Overall transfer level: Needs assistance Equipment used: Rolling walker (2 wheels) Transfers: Sit to/from Stand Sit to Stand: Min assist, Mod assist           General transfer comment: vc's to shift weight forward d/t posterior lean; vc's for UE/LE positioning and overall technique; x5 trials from bed    Ambulation/Gait Ambulation/Gait assistance: Contact guard assist, Min assist Gait Distance (Feet):  (x10 reps B LE's (x2 sets)) Assistive device: Rolling walker (2 wheels)   Gait velocity: decreased     General Gait Details: assist to steady; increased effort/time to take steps   Stairs             Wheelchair Mobility     Tilt Bed    Modified Rankin (Stroke Patients Only)       Balance Overall balance assessment: Needs assistance Sitting-balance support: No upper extremity supported, Feet supported Sitting balance-Leahy Scale: Good Sitting balance - Comments: steady reaching within BOS   Standing balance support: Bilateral upper extremity supported, Reliant on assistive device for balance Standing balance-Leahy Scale: Poor Standing balance comment: posterior lean in standing requiring assist and cueing to correct                            Communication Communication Communication: No apparent difficulties  Cognition Arousal: Alert Behavior During Therapy: WFL for tasks assessed/performed   PT - Cognitive impairments: No apparent impairments  Following commands: Impaired Following commands impaired: Follows one step commands with increased time    Cueing Cueing Techniques: Verbal cues, Tactile cues, Visual cues  Exercises      General Comments  Nursing cleared pt for participation in physical therapy.  Pt agreeable to PT session.      Pertinent Vitals/Pain Pain Assessment Pain Assessment: No/denies  pain    Home Living                          Prior Function            PT Goals (current goals can now be found in the care plan section) Acute Rehab PT Goals Patient Stated Goal: to get better PT Goal Formulation: With patient Time For Goal Achievement: 10/14/23 Potential to Achieve Goals: Good    Frequency    Min 2X/week      PT Plan      Co-evaluation              AM-PAC PT 6 Clicks Mobility   Outcome Measure  Help needed turning from your back to your side while in a flat bed without using bedrails?: A Little Help needed moving from lying on your back to sitting on the side of a flat bed without using bedrails?: A Lot Help needed moving to and from a bed to a chair (including a wheelchair)?: A Lot Help needed standing up from a chair using your arms (e.g., wheelchair or bedside chair)?: A Lot Help needed to walk in hospital room?: A Lot Help needed climbing 3-5 steps with a railing? : Total 6 Click Score: 12    End of Session Equipment Utilized During Treatment: Gait belt;Oxygen Activity Tolerance: Patient tolerated treatment well Patient left: in bed;with call bell/phone within reach;with bed alarm set Nurse Communication: Mobility status;Precautions;Other (comment) (Pt's SpO2 sats during session) PT Visit Diagnosis: Unsteadiness on feet (R26.81);Muscle weakness (generalized) (M62.81);History of falling (Z91.81);Difficulty in walking, not elsewhere classified (R26.2)     Time: 8555-8492 PT Time Calculation (min) (ACUTE ONLY): 23 min  Charges:    $Gait Training: 8-22 mins $Therapeutic Activity: 8-22 mins PT General Charges $$ ACUTE PT VISIT: 1 Visit                     Damien Caulk, PT 10/04/23, 5:27 PM

## 2023-10-04 NOTE — Progress Notes (Signed)
 Palliative Care Progress Note, Assessment & Plan   Patient Name: Cristian Martin       Date: 10/04/2023 DOB: 07-22-1945  Age: 78 y.o. MRN#: [id number] Attending Physician: Josette Ade, MD Primary Care Physician: [facility] Date: 09/29/2023  Subjective: Patient is out of bed and sitting in recliner.  He is awake, alert, acknowledges my presence, and is able to make his wishes known.  No family or friends present during my visit.  HPI: 78 y.o. male  with past medical history significant for CAD s/p CABG, HTN, HFpEF, GERD, DM II, HLD and peripheral neuropathy. Patient presented to ED 09/29/2023 c/o AMS, fever and N/V. EMS reports 82% O2 on RA and temp 102.6. He was placed on NRB and transported to ED. On arrival, pt was A&O, stating he was okay when he went to bed but unable to tell what happened overnight.    ED labs significant for WBC 2.9, lactic acid 1.6, K+ 3.0, glucose 64. CXR showed R midlung opacity concerning for PNA. Respiratory panel negative. Pt required HHFNC 15 L with SpO2 in the upper 90s.   TRH was consulted for admission and management of acute hypoxemic respiratory failure and community-acquired pneumonia versus aspiration pneumonia.   Palliative was consulted to assist with goals of care conversations.  Summary of counseling/coordination of care: Extensive chart review completed prior to meeting patient including labs, vital signs, imaging, progress notes, orders, and available advanced directive documents from current and previous encounters.   After reviewing the patient's chart and assessing the patient at bedside, I spoke with patient in regards to symptom management and goals of care.   Symptoms assessed.  Patient endorses he is feeling better and that his has improved.   Discussed continuing to reduce supplemental oxygen while maintaining oxygen saturation.  Patient endorses understanding.  I reviewed plan of care with patient.  Space and opportunity provided for patient to share his thoughts and asked questions regarding his current medical situation.  He remains in agreement with full code and full scope of care.  He shares he would like to continue to live and that his only concern now is which rehab he will go to.  TOC following closely for discharge planning.  Of note, my colleague has already completed a MOST form and goals of care documentation reflecting patient's wishes.  Symptom burden is low.  Goals are clear.  PMT will step back from daily visits and monitor the patient peripherally.  Please re-engage with PMT if goals change, at patient/family's request, or if patient's health deteriorates during hospitalization.    Physical Exam Constitutional:      Appearance: He is normal weight.  HENT:     Head: Normocephalic.     Mouth/Throat:     Mouth: Mucous membranes are moist.  Eyes:     Pupils: Pupils are equal, round, and reactive to light.  Pulmonary:     Effort: Pulmonary effort is normal.  Abdominal:     Palpations: Abdomen is soft.  Skin:    General: Skin is warm and dry.     Coloration: Skin is pale.  Neurological:     Mental Status: He is alert and oriented to  person, place, and time.  Psychiatric:        Mood and Affect: Mood normal.        Behavior: Behavior normal.        Thought Content: Thought content normal.        Judgment: Judgment normal.             Total Time 25 minutes   Time spent includes: Detailed review of medical records (labs, imaging, vital signs), medically appropriate exam (mental status, respiratory, cardiac, skin), discussed with treatment team, counseling and educating patient, family and staff, documenting clinical information, medication management and coordination of care.  Lamarr L. Arvid, DNP,  FNP-BC Palliative Medicine Team

## 2023-10-04 NOTE — Assessment & Plan Note (Signed)
 Hold Lasix .  Creatinine is 0.85 yesterday went up to 1.23.  Give IV fluid bolus.

## 2023-10-04 NOTE — Progress Notes (Signed)
 Mobility Specialist - Progress Note     10/04/23 1557  Mobility  Activity Stood at bedside;Pivoted/transferred from bed to chair  Level of Assistance Minimal assist, patient does 75% or more  Assistive Device Front wheel walker  Distance Ambulated (ft) 3 ft  Range of Motion/Exercises Active  Activity Response Tolerated well  Mobility Referral Yes  Mobility visit 1 Mobility  Mobility Specialist Start Time (ACUTE ONLY) 1528  Mobility Specialist Stop Time (ACUTE ONLY) 1545  Mobility Specialist Time Calculation (min) (ACUTE ONLY) 17 min   Pt resting in bed on 6L upon entry. Pt STS and transferred to chair MinA with RW. Pt left in recliner with needs in reach and chair alarm activated.   Guido Rumble Mobility Specialist 10/04/23, 4:28 PM

## 2023-10-04 NOTE — Plan of Care (Signed)

## 2023-10-04 NOTE — Progress Notes (Signed)
 Progress Note   Patient: Cristian Martin FMW:[id number] DOB: 09-16-1945 DOA: 09/29/2023     5 DOS: the patient was seen and examined on 10/04/2023   Brief hospital course: 78 y.o. male with medical history significant for CAD s/p CABG HFpEF, HLD, GERD, HTN, DM on insulin , anxiety who presented to ED after altered mental status, fever, nausea and vomiting.  EMS was called out by the patient's wife who last saw the patient normal last night.  This morning she found the patient to be very somnolent, less responsive, covered with vomitus which presumably happened overnight.  EMS states roommate air saturation was 82% with no baseline oxygen requirement, placed the patient on nonrebreather found to have a 102.6 F temperature, transported to emergency room for further evaluation. Patient was seen and examined at bedside in the emergency room.  Patient was alert awake and oriented.  He stated that he was okay while he went to the bed last night.  But he was not able to tell me what happened in the night and this morning.  He was able to tell me he is in the hospital and he was able to tell me the date, year and the month.   ED Course: Upon arrival to the ED, patient is found to be alert awake, was able to tell his name but unable to tell where is he and what is the date.  Chest x-ray showed right midlung opacity concerning for pneumonia, leukopenia at 2.9, hypoxemia with normal lactic acid at 1.6.  Respiratory panel were negative patient required humidified high flow oxygen via nasal cannula at 15 L in upper 90s saturation.  Patient was given vancomycin  metronidazole  and cefepime  and 1 L of lactated Ringer 's bolus and hospitalist service was consulted for evaluation for admission for possible pneumonia and acute hypoxic respiratory failure  8/29.  Called this morning for hypoglycemia.  Patient was mentating and following commands.  Sugar confirmed low on lab draw and was replaced.  Patient received Lantus  20 units  last night.  Normally takes 70/30 insulin  60 units twice a day.  Patient admitted with acute respiratory failure on 15 L high flow nasal cannula secondary to pneumonia right side. 8/30.  Patient had a rough night did not sleep pulled out numerous IVs and wanting to go home.  Called to see patient first.  Patient delirious.  Family at bedside. 9/1.  Patient still on 10 L high flow nasal cannula.  Feeling better.  Sitting up eating lunch.  Pulmonary started steroids.  Assessment and Plan: * Severe sepsis (HCC) Present on admission with fever, tachycardia, tachypnea, hypoxia (acute respiratory failure), leukopenia.  Patient has lobar pneumonia on right side.  Since MRSA PCR negative vancomycin  was discontinued.  Patient on Rocephin  and completed Zithromax .  Pulmonary started steroids.  Acute delirium Continue Seroquel  at night and as needed Haldol .  Mental status was better over the past 2 days but this morning a little tired since he was woken up a few times.  May also be steroid related  Lobar pneumonia (HCC) Right-sided pneumonia.  Continue Rocephin  and completed Zithromax .  Since MRSA PCR negative vancomycin  discontinued.  Nebulizers and incentive spirometry.  Acute hypoxemic respiratory failure (HCC) Initial pulse ox 88% on nonrebreather mask.  Patient on 15 L high flow nasal cannula for 2 days.  Patient on 10 L nasal cannula high flow yesterday.  Patient down to 6 L high flow nasal cannula today.  Uncontrolled type 2 diabetes mellitus with hyperglycemia, with long-term  current use of insulin  (HCC) With pulmonary and starting steroids on 9/1, I  increased Lantus  insulin  to 25 units in the evening and increase sliding scale and add NovoLog  2 units prior to meals.  Hypoglycemia level 3 Sugar 38 on the morning of 8/29.    AKI (acute kidney injury) (HCC) Hold Lasix .  Creatinine is 0.85 yesterday went up to 1.23.  Give IV fluid bolus.  Depression Psychiatry restarted Cymbalta .  No safety  issues noted by psychiatry.  Hypomagnesemia Replaced  Hypokalemia Replace orally today  Malignant neoplasm of prostate (HCC) Follow-up as outpatient  Hyperlipidemia, mixed On Crestor   GERD (gastroesophageal reflux disease) On Protonix   Benign essential hypertension Continue Coreg  and hold Lasix         Subjective: Patient was sitting up but kept his eyes closed.  He stated he had a rough night he was woken up quite a few times.  Physical Exam: Vitals:   10/04/23 1015 10/04/23 1030 10/04/23 1139 10/04/23 1201  BP:    122/69  Pulse: 99 86  81  Resp: 13 (!) 21  20  Temp:    97.6 F (36.4 C)  TempSrc:    Oral  SpO2: (!) 84% 93% 91% 93%  Weight:      Height:       Physical Exam HENT:     Head: Normocephalic.  Eyes:     General: Lids are normal.     Conjunctiva/sclera: Conjunctivae normal.  Cardiovascular:     Rate and Rhythm: Normal rate and regular rhythm.     Heart sounds: Normal heart sounds, S1 normal and S2 normal.  Pulmonary:     Breath sounds: Examination of the right-middle field reveals decreased breath sounds. Examination of the right-lower field reveals decreased breath sounds. Examination of the left-lower field reveals decreased breath sounds. Decreased breath sounds present. No wheezing, rhonchi or rales.  Abdominal:     Palpations: Abdomen is soft.     Tenderness: There is no abdominal tenderness.  Musculoskeletal:     Right lower leg: No swelling.     Left lower leg: No swelling.  Skin:    General: Skin is warm.     Findings: No rash.  Neurological:     Mental Status: He is alert.     Data Reviewed: Sodium 144, potassium 3.2, creatinine 1.23, GFR 60, CBC white blood cell count 8.8, hemoglobin 13.2, platelet count 296  Family Communication: Left message for daughter  Disposition: Status is: Inpatient Remains inpatient appropriate because: Looks like this afternoon down to 6 L high flow nasal cannula.  Will need to have better oxygenation  prior to disposition.  Planned Discharge Destination: Rehab    Time spent: 28 minutes  Author: Charlie Patterson, MD 10/04/2023 2:01 PM  For on call review [email or web address].

## 2023-10-04 NOTE — Plan of Care (Addendum)
 Fio2 requirements are improving, down to 6L [employer]. Recommend decreasing fio2 to keep o2 sats >88%. Orders placed to reflect goals   Will sign off at this time

## 2023-10-05 DIAGNOSIS — A419 Sepsis, unspecified organism: Secondary | ICD-10-CM | POA: Diagnosis not present

## 2023-10-05 DIAGNOSIS — J181 Lobar pneumonia, unspecified organism: Secondary | ICD-10-CM | POA: Diagnosis not present

## 2023-10-05 DIAGNOSIS — F32A Depression, unspecified: Secondary | ICD-10-CM

## 2023-10-05 DIAGNOSIS — R410 Disorientation, unspecified: Secondary | ICD-10-CM | POA: Diagnosis not present

## 2023-10-05 DIAGNOSIS — J9601 Acute respiratory failure with hypoxia: Secondary | ICD-10-CM | POA: Diagnosis not present

## 2023-10-05 LAB — GLUCOSE, CAPILLARY
Glucose-Capillary: 235 mg/dL — ABNORMAL HIGH (ref 70–99)
Glucose-Capillary: 248 mg/dL — ABNORMAL HIGH (ref 70–99)
Glucose-Capillary: 250 mg/dL — ABNORMAL HIGH (ref 70–99)
Glucose-Capillary: 254 mg/dL — ABNORMAL HIGH (ref 70–99)
Glucose-Capillary: 291 mg/dL — ABNORMAL HIGH (ref 70–99)
Glucose-Capillary: 298 mg/dL — ABNORMAL HIGH (ref 70–99)
Glucose-Capillary: 425 mg/dL — ABNORMAL HIGH (ref 70–99)

## 2023-10-05 LAB — BASIC METABOLIC PANEL WITH GFR
Anion gap: 13 (ref 5–15)
BUN: 22 mg/dL (ref 8–23)
CO2: 25 mmol/L (ref 22–32)
Calcium: 8.8 mg/dL — ABNORMAL LOW (ref 8.9–10.3)
Chloride: 100 mmol/L (ref 98–111)
Creatinine, Ser: 1.05 mg/dL (ref 0.61–1.24)
GFR, Estimated: >60 mL/min (ref >60)
Glucose, Bld: 321 mg/dL — ABNORMAL HIGH (ref 70–99)
Potassium: 4 mmol/L (ref 3.5–5.1)
Sodium: 138 mmol/L (ref 135–145)

## 2023-10-05 MED ORDER — INSULIN ASPART 100 UNIT/ML IJ SOLN
0.0000 [IU] | Freq: Three times a day (TID) | INTRAMUSCULAR | Status: DC
  Administered 2023-10-05: 7 [IU] via SUBCUTANEOUS
  Administered 2023-10-05: 20 [IU] via SUBCUTANEOUS
  Administered 2023-10-06: 11 [IU] via SUBCUTANEOUS
  Administered 2023-10-06: 20 [IU] via SUBCUTANEOUS
  Administered 2023-10-06: 11 [IU] via SUBCUTANEOUS
  Administered 2023-10-07: 4 [IU] via SUBCUTANEOUS
  Administered 2023-10-07: 7 [IU] via SUBCUTANEOUS
  Administered 2023-10-08: 4 [IU] via SUBCUTANEOUS
  Administered 2023-10-08 – 2023-10-09 (×3): 3 [IU] via SUBCUTANEOUS
  Administered 2023-10-09 (×2): 4 [IU] via SUBCUTANEOUS
  Administered 2023-10-10: 7 [IU] via SUBCUTANEOUS
  Administered 2023-10-10 – 2023-10-11 (×2): 4 [IU] via SUBCUTANEOUS
  Administered 2023-10-11: 7 [IU] via SUBCUTANEOUS
  Administered 2023-10-12 (×2): 3 [IU] via SUBCUTANEOUS
  Administered 2023-10-12: 4 [IU] via SUBCUTANEOUS
  Administered 2023-10-13: 7 [IU] via SUBCUTANEOUS
  Administered 2023-10-13: 4 [IU] via SUBCUTANEOUS
  Administered 2023-10-13: 3 [IU] via SUBCUTANEOUS
  Administered 2023-10-14: 7 [IU] via SUBCUTANEOUS
  Administered 2023-10-14: 3 [IU] via SUBCUTANEOUS
  Filled 2023-10-05 (×24): qty 1

## 2023-10-05 MED ORDER — INSULIN GLARGINE 100 UNIT/ML ~~LOC~~ SOLN
28.0000 [IU] | Freq: Every day | SUBCUTANEOUS | Status: DC
  Administered 2023-10-05: 28 [IU] via SUBCUTANEOUS
  Filled 2023-10-05: qty 0.28

## 2023-10-05 MED ORDER — INSULIN ASPART 100 UNIT/ML IJ SOLN
5.0000 [IU] | Freq: Three times a day (TID) | INTRAMUSCULAR | Status: DC
  Administered 2023-10-05 – 2023-10-06 (×3): 5 [IU] via SUBCUTANEOUS
  Filled 2023-10-05 (×3): qty 1

## 2023-10-05 NOTE — Progress Notes (Signed)
 Occupational Therapy Treatment Patient Details Name: Cristian Martin MRN: [id number] DOB: 20-May-1945 Today's Date: 10/05/2023   History of present illness Cristian Martin is a pleasant 78 y.o. male with medical history significant for CAD s/p CABG HFpEF, HLD, GERD, HTN, DM on insulin , anxiety who presented to ED after altered mental status, fever, nausea and vomiting. MD dx include: Acute hypoxemic respiratory failure likely due to pneumonia, congestive heart failure, HTN, diabetes, hypokalemia, and CAD.   OT comments  Chart reviewed to date, pt greeted in chair, reporting he feels better. He is alert and oriented x 4, improved participation as compared to yesterday. Pt is making progress towards goals as evidenced by amb approx 6' forward and backward 2 attempts with RW. MAX A required for LB dressing. Supervision required for grooming tasks. Pt continues to perform ADL/functional mobility below PLOF, will benefit from acute OT to address functional deficits and to facilitate optimal ADL/functional mobility performance. OT will continue to follow.       If plan is discharge home, recommend the following:  A lot of help with walking and/or transfers;A lot of help with bathing/dressing/bathroom   Equipment Recommendations  BSC/3in1    Recommendations for Other Services      Precautions / Restrictions Precautions Precautions: Fall Recall of Precautions/Restrictions: Impaired Restrictions Weight Bearing Restrictions Per Provider Order: No       Mobility Bed Mobility               General bed mobility comments: NT in recliner pre/post session    Transfers Overall transfer level: Needs assistance Equipment used: Rolling walker (2 wheels) Transfers: Sit to/from Stand Sit to Stand: Min assist (two attempts from recliner; frequent cueing for technique)                 Balance Overall balance assessment: Needs assistance Sitting-balance support: No upper extremity  supported, Feet supported Sitting balance-Leahy Scale: Good     Standing balance support: Bilateral upper extremity supported, Reliant on assistive device for balance Standing balance-Leahy Scale: Poor                             ADL either performed or assessed with clinical judgement   ADL Overall ADL's : Needs assistance/impaired Eating/Feeding: Supervision/ safety;Sitting                   Lower Body Dressing: Maximal assistance   Toilet Transfer: Minimal assistance;Moderate assistance;Ambulation;Rolling walker (2 wheels) Toilet Transfer Details (indicate cue type and reason): simulated         Functional mobility during ADLs: Minimal assistance;Moderate assistance;Rolling walker (2 wheels) (approx 6' two attempts forward and back; frequent multi modal cues for technique, one posterior LOB that pt requires MOD A to correct)      Extremity/Trunk Assessment              Vision       Perception     Praxis     Communication Communication Communication: No apparent difficulties Factors Affecting Communication: Reduced clarity of speech   Cognition Arousal: Alert Behavior During Therapy: WFL for tasks assessed/performed Cognition: Cognition impaired         Attention impairment (select first level of impairment): Selective attention Executive functioning impairment (select all impairments): Problem solving                   Following commands: Impaired Following commands impaired: Follows one step commands with  increased time      Cueing   Cueing Techniques: Verbal cues, Tactile cues, Visual cues  Exercises Other Exercises Other Exercises: edu re role of OT, role of rehab    Shoulder Instructions       General Comments spo2 >88% on 5L via HFNC throughout    Pertinent Vitals/ Pain       Pain Assessment Pain Assessment: No/denies pain  Home Living                                          Prior  Functioning/Environment              Frequency  Min 2X/week        Progress Toward Goals  OT Goals(current goals can now be found in the care plan section)  Progress towards OT goals: Progressing toward goals  Acute Rehab OT Goals Time For Goal Achievement: 10/14/23  Plan      Co-evaluation                 AM-PAC OT 6 Clicks Daily Activity     Outcome Measure   Help from another person eating meals?: None Help from another person taking care of personal grooming?: A Little Help from another person toileting, which includes using toliet, bedpan, or urinal?: A Lot Help from another person bathing (including washing, rinsing, drying)?: A Lot Help from another person to put on and taking off regular upper body clothing?: A Little Help from another person to put on and taking off regular lower body clothing?: A Lot 6 Click Score: 16    End of Session Equipment Utilized During Treatment: Oxygen;Rolling walker (2 wheels)  OT Visit Diagnosis: Other abnormalities of gait and mobility (R26.89);Muscle weakness (generalized) (M62.81)   Activity Tolerance Patient tolerated treatment well   Patient Left with call bell/phone within reach;in chair;with chair alarm set   Nurse Communication Mobility status        Time: 8871-8858 OT Time Calculation (min): 13 min  Charges: OT General Charges $OT Visit: 1 Visit OT Treatments $Therapeutic Activity: 8-22 mins  Therisa Sheffield, OTD OTR/L  10/05/23, 1:11 PM

## 2023-10-05 NOTE — TOC Progression Note (Signed)
 Transition of Care [facility]) - Progression Note    Patient Details  Name: DEMETRI GOSHERT MRN: [id number] Date of Birth: 02/01/1946  [facility] CM/SW Contact  Lauraine JAYSON Carpen, LCSW Phone Number: 10/05/2023, 3:17 PM  Clinical Narrative:   Wife is interested in [place] placement. [place] goes to the [place]. CSW left a voicemail for his social worker at the clinic Ritta [facility]ian ext [phone]) to confirm [facility] for [facility] and get a list of facilities they are in network with. Wife is aware that there are typically only a few facilities within the state that are contracted with the [place] clinics.    Barriers to Discharge: Continued Medical Work up               Expected Discharge Plan and Services                                               Social Drivers of Health (SDOH) Interventions SDOH Screenings   Food Insecurity: No Food Insecurity (09/30/2023)  Housing: Low Risk  (09/30/2023)  Transportation Needs: No Transportation Needs (09/30/2023)  Utilities: Not At Risk (09/30/2023)  Depression (PHQ2-9): Medium Risk (08/23/2019)  Social Connections: Moderately Isolated (09/30/2023)  Tobacco Use: Low Risk  (09/29/2023)    Readmission Risk Interventions     No data to display

## 2023-10-05 NOTE — Progress Notes (Signed)
 PT Cancellation Note  Patient Details Name: BROOKE PAYES MRN: [id number] DOB: [birth year]   Cancelled Treatment:    Reason Eval/Treat Not Completed: Fatigue/lethargy limiting ability to participate On arrival, patient returning to supine in bed with nursing staff present. Declining getting back up for therapy session. Will follow up at later date/time.   Maryanne Finder, PT, DPT Physical Therapist - [place]  [facility]    Dyami Umbach A Nyomi Howser 10/05/2023, 2:30 PM

## 2023-10-05 NOTE — Inpatient Diabetes Management (Signed)
 Inpatient Diabetes Program Recommendations  AACE/ADA: New Consensus Statement on Inpatient Glycemic Control   Target Ranges:  Prepandial:   less than 140 mg/dL      Peak postprandial:   less than 180 mg/dL (1-2 hours)      Critically ill patients:  140 - 180 mg/dL    Latest Reference Range & Units 10/04/23 07:05 10/04/23 11:59 10/04/23 16:59 10/04/23 20:06 10/05/23 00:00 10/05/23 05:17  Glucose-Capillary 70 - 99 mg/dL 804 (H) 681 (H) 649 (H) 334 (H) 298 (H) 250 (H)   Review of Glycemic Control  Diabetes history: DM2 Outpatient Diabetes medications: Jardiance 12.5 mg QAM, 70/30 60 units QAM, 70/30 50 units QPM, Metformin  1000 mg BID, Ozempic  2 mg Qweek Current orders for Inpatient glycemic control: Lantus  25 units at bedtime, Novolog  0-9 units TID with meals, Novolog  0-5 units at bedtime, Novolog  2 units TID with meals; Solucortef 100 mg Q12H  Inpatient Diabetes Program Recommendations:    Insulin : If steroids are continued as ordered, please consider increasing Lantus  to 28 units at bedtime and meal coverage to Novolog  6 units TID with meals.  Discharge Recommendations: Other recommendations: Freestyle Libre 3 CGM reader device (Patient lost his): Order Number [id number] Supply/Referral recommendations:  Freestyle Libre 3 CGM reader device (Patient lost his): Order Number [id number]  Thanks, Earnie Gainer, RN, MSN, CDCES Diabetes Coordinator Inpatient Diabetes Program [phone] (Team Pager from 8am to 5pm)   Use Adult Diabetes Insulin  Treatment Post Discharge order set.

## 2023-10-05 NOTE — Progress Notes (Signed)
 Progress Note   Patient: Cristian Martin FMW:[id number] DOB: 1945-03-12 DOA: 09/29/2023     6 DOS: the patient was seen and examined on 10/05/2023   Brief hospital course: 78 y.o. male with medical history significant for CAD s/p CABG HFpEF, HLD, GERD, HTN, DM on insulin , anxiety who presented to ED after altered mental status, fever, nausea and vomiting.  EMS was called out by the patient's wife who last saw the patient normal last night.  This morning she found the patient to be very somnolent, less responsive, covered with vomitus which presumably happened overnight.  EMS states roommate air saturation was 82% with no baseline oxygen requirement, placed the patient on nonrebreather found to have a 102.6 F temperature, transported to emergency room for further evaluation. Patient was seen and examined at bedside in the emergency room.  Patient was alert awake and oriented.  He stated that he was okay while he went to the bed last night.  But he was not able to tell me what happened in the night and this morning.  He was able to tell me he is in the hospital and he was able to tell me the date, year and the month.   ED Course: Upon arrival to the ED, patient is found to be alert awake, was able to tell his name but unable to tell where is he and what is the date.  Chest x-ray showed right midlung opacity concerning for pneumonia, leukopenia at 2.9, hypoxemia with normal lactic acid at 1.6.  Respiratory panel were negative patient required humidified high flow oxygen via nasal cannula at 15 L in upper 90s saturation.  Patient was given vancomycin  metronidazole  and cefepime  and 1 L of lactated Ringer 's bolus and hospitalist service was consulted for evaluation for admission for possible pneumonia and acute hypoxic respiratory failure  8/29.  Called this morning for hypoglycemia.  Patient was mentating and following commands.  Sugar confirmed low on lab draw and was replaced.  Patient received Lantus  20 units  last night.  Normally takes 70/30 insulin  60 units twice a day.  Patient admitted with acute respiratory failure on 15 L high flow nasal cannula secondary to pneumonia right side.  8/30.  Patient had a rough night did not sleep pulled out numerous IVs and wanting to go home.  Called to see patient first.  Patient delirious.  Family at bedside.  9/1.  Patient still on 10 L high flow nasal cannula.  Feeling better.  Sitting up eating lunch.  Pulmonary started steroids.  9/2: Vital stable on 5 L of oxygen, CBG elevated-increasing Lantus  to 28 and meal coverage to 5 as patient is on steroid now  Assessment and Plan: * Severe sepsis (HCC) Present on admission with fever, tachycardia, tachypnea, hypoxia (acute respiratory failure), leukopenia.  Patient has lobar pneumonia on right side.  Since MRSA PCR negative vancomycin  was discontinued.  Patient on Rocephin  and completed Zithromax .  Pulmonary started steroids.  Acute delirium Continue Seroquel  at night and as needed Haldol .    Lobar pneumonia (HCC) Right-sided pneumonia.  Continue Rocephin  and completed Zithromax .  Since MRSA PCR negative vancomycin  discontinued.  Nebulizers and incentive spirometry.  Acute hypoxemic respiratory failure (HCC) Initial pulse ox 88% on nonrebreather mask.  Patient on 15 L high flow nasal cannula for 2 days.   Pulmonary started him on steroids with improvement in oxygen requirement, currently weaned to 3 L. - Continue supplemental oxygen-wean as tolerated  Uncontrolled type 2 diabetes mellitus with hyperglycemia, with  long-term current use of insulin  (HCC) With pulmonary and starting steroids on 9/1, CBG remained elevated increased Lantus  insulin  to 28 units in the evening  - Increase mealtime coverage to 5 unit -Continue with current SSI  Hypoglycemia level 3 Sugar 38 on the morning of 8/29.   Now patient has hyperglycemia.  AKI (acute kidney injury) (HCC) Creatinine improved 1.05 today.  S/p IV  fluid -Monitor renal function  Depression Psychiatry restarted Cymbalta .  No safety issues noted by psychiatry.  Hypomagnesemia Replaced  Hypokalemia Resolved after repletion  Malignant neoplasm of prostate (HCC) Follow-up as outpatient  Hyperlipidemia, mixed On Crestor   GERD (gastroesophageal reflux disease) On Protonix   Benign essential hypertension Continue Coreg  and hold Lasix    Subjective: Patient was sitting comfortably in chair when seen today.  Denies any chest pain or shortness of breath, stating that he is feeling little improved.  Physical Exam: Vitals:   10/05/23 1000 10/05/23 1204 10/05/23 1300 10/05/23 1330  BP:  139/77    Pulse:  83    Resp:  17    Temp:  97.8 F (36.6 C)    TempSrc:  Oral    SpO2: 94% 95% 95% 100%  Weight:      Height:       General.  Frail elderly man, in no acute distress. Pulmonary.  Lungs clear bilaterally, normal respiratory effort. CV.  Regular rate and rhythm, no JVD, rub or murmur. Abdomen.  Soft, nontender, nondistended, BS positive. CNS.  Alert and oriented .  No focal neurologic deficit. Extremities.  No edema, no cyanosis, pulses intact and symmetrical. Psychiatry.  Judgment and insight appears normal.    Data Reviewed: Prior data reviewed  Family Communication: Called wife with no response  Disposition: Status is: Inpatient Remains inpatient appropriate because: Severity of illness  Planned Discharge Destination: Rehab  This record has been created using Conservation officer, historic buildings. Errors have been sought and corrected,but may not always be located. Such creation errors do not reflect on the standard of care.   Time spent: 50 minutes  Author: Amaryllis Dare, MD 10/05/2023 2:12 PM  For on call review [email or web address].

## 2023-10-06 DIAGNOSIS — J181 Lobar pneumonia, unspecified organism: Secondary | ICD-10-CM | POA: Diagnosis not present

## 2023-10-06 DIAGNOSIS — R410 Disorientation, unspecified: Secondary | ICD-10-CM | POA: Diagnosis not present

## 2023-10-06 DIAGNOSIS — A419 Sepsis, unspecified organism: Secondary | ICD-10-CM | POA: Diagnosis not present

## 2023-10-06 DIAGNOSIS — J9601 Acute respiratory failure with hypoxia: Secondary | ICD-10-CM | POA: Diagnosis not present

## 2023-10-06 LAB — GLUCOSE, CAPILLARY
Glucose-Capillary: 159 mg/dL — ABNORMAL HIGH (ref 70–99)
Glucose-Capillary: 224 mg/dL — ABNORMAL HIGH (ref 70–99)
Glucose-Capillary: 257 mg/dL — ABNORMAL HIGH (ref 70–99)
Glucose-Capillary: 273 mg/dL — ABNORMAL HIGH (ref 70–99)
Glucose-Capillary: 277 mg/dL — ABNORMAL HIGH (ref 70–99)
Glucose-Capillary: 351 mg/dL — ABNORMAL HIGH (ref 70–99)

## 2023-10-06 MED ORDER — INSULIN GLARGINE 100 UNIT/ML ~~LOC~~ SOLN
30.0000 [IU] | Freq: Every day | SUBCUTANEOUS | Status: DC
  Administered 2023-10-06: 30 [IU] via SUBCUTANEOUS
  Filled 2023-10-06 (×2): qty 0.3

## 2023-10-06 MED ORDER — HYDROCORTISONE SOD SUC (PF) 100 MG IJ SOLR
100.0000 mg | Freq: Every day | INTRAMUSCULAR | Status: DC
  Administered 2023-10-07: 100 mg via INTRAVENOUS
  Filled 2023-10-06: qty 2

## 2023-10-06 MED ORDER — INSULIN ASPART 100 UNIT/ML IJ SOLN
7.0000 [IU] | Freq: Three times a day (TID) | INTRAMUSCULAR | Status: DC
  Administered 2023-10-06 – 2023-10-14 (×22): 7 [IU] via SUBCUTANEOUS
  Filled 2023-10-06 (×22): qty 1

## 2023-10-06 NOTE — Progress Notes (Signed)
 Progress Note   Patient: Cristian Martin FMW:[id number] DOB: [birth year] DOA: 09/29/2023     7 DOS: the patient was seen and examined on 10/06/2023   Brief hospital course: 78 y.o. male with medical history significant for CAD s/p CABG HFpEF, HLD, GERD, HTN, DM on insulin , anxiety who presented to ED after altered mental status, fever, nausea and vomiting.  EMS was called out by the patient's wife who last saw the patient normal last night.  This morning she found the patient to be very somnolent, less responsive, covered with vomitus which presumably happened overnight.  EMS states roommate air saturation was 82% with no baseline oxygen requirement, placed the patient on nonrebreather found to have a 102.6 F temperature, transported to emergency room for further evaluation. Patient was seen and examined at bedside in the emergency room.  Patient was alert awake and oriented.  He stated that he was okay while he went to the bed last night.  But he was not able to tell me what happened in the night and this morning.  He was able to tell me he is in the hospital and he was able to tell me the date, year and the month.   ED Course: Upon arrival to the ED, patient is found to be alert awake, was able to tell his name but unable to tell where is he and what is the date.  Chest x-ray showed right midlung opacity concerning for pneumonia, leukopenia at 2.9, hypoxemia with normal lactic acid at 1.6.  Respiratory panel were negative patient required humidified high flow oxygen via nasal cannula at 15 L in upper 90s saturation.  Patient was given vancomycin  metronidazole  and cefepime  and 1 L of lactated Ringer 's bolus and hospitalist service was consulted for evaluation for admission for possible pneumonia and acute hypoxic respiratory failure  8/29.  Called this morning for hypoglycemia.  Patient was mentating and following commands.  Sugar confirmed low on lab draw and was replaced.  Patient received Lantus  20 units  last night.  Normally takes 70/30 insulin  60 units twice a day.  Patient admitted with acute respiratory failure on 15 L high flow nasal cannula secondary to pneumonia right side.  8/30.  Patient had a rough night did not sleep pulled out numerous IVs and wanting to go home.  Called to see patient first.  Patient delirious.  Family at bedside.  9/1.  Patient still on 10 L high flow nasal cannula.  Feeling better.  Sitting up eating lunch.  Pulmonary started steroids.  9/3: Vital stable on 5 L of oxygen, CBG elevated-increasing Lantus  to 28 and meal coverage to 5 as patient is on steroid now  9/4: Vital stable now on 2 L of oxygen.  CBG remained elevated so increasing Lantus  to 30 and mealtime coverage to 7.  Started tapering steroid.  TOC is working on placement  Assessment and Plan: * Severe sepsis (HCC) Present on admission with fever, tachycardia, tachypnea, hypoxia (acute respiratory failure), leukopenia.  Patient has lobar pneumonia on right side.  Since MRSA PCR negative vancomycin  was discontinued.  Patient on Rocephin  and completed Zithromax .  Pulmonary started steroids. - Started tapering steroid now  Acute delirium Continue Seroquel  at night and as needed Haldol .    Lobar pneumonia (HCC) Right-sided pneumonia.  Continue Rocephin  and completed Zithromax .  Since MRSA PCR negative vancomycin  discontinued.  Nebulizers and incentive spirometry.  Acute hypoxemic respiratory failure (HCC) Initial pulse ox 88% on nonrebreather mask.  Patient on 15 L  high flow nasal cannula for 2 days.   Pulmonary started him on steroids with improvement in oxygen requirement, currently weaned to 2 L. - Continue supplemental oxygen-wean as tolerated - Started tapering steroid  Uncontrolled type 2 diabetes mellitus with hyperglycemia, with long-term current use of insulin  (HCC) With pulmonary and starting steroids on 9/1, CBG remained elevated increased Lantus  insulin  to 30 units in the evening  -  Increase mealtime coverage to 7 unit -Continue with current SSI  Hypoglycemia level 3 Sugar 38 on the morning of 8/29.   Now patient has hyperglycemia.  AKI (acute kidney injury) (HCC) Creatinine improved 1.05.  S/p IV fluid -Monitor renal function  Depression Psychiatry restarted Cymbalta .  No safety issues noted by psychiatry.  Hypomagnesemia Replaced  Hypokalemia Resolved after repletion  Malignant neoplasm of prostate (HCC) Follow-up as outpatient  Hyperlipidemia, mixed On Crestor   GERD (gastroesophageal reflux disease) On Protonix   Benign essential hypertension Continue Coreg  and hold Lasix    Subjective: Patient was sitting in chair when seen today.  Denies any more shortness of breath.  No new concern.  Physical Exam: Vitals:   10/06/23 0422 10/06/23 0800 10/06/23 1559 10/06/23 1621  BP:  (!) 150/86 (!) 155/88 133/72  Pulse:  82 83 82  Resp:  19 18 18   Temp: 97.8 F (36.6 C) 97.6 F (36.4 C) 98.6 F (37 C) (!) 97.5 F (36.4 C)  TempSrc:  Oral    SpO2:  94% 96% 98%  Weight:      Height:       General.  Frail elderly man, in no acute distress. Pulmonary.  Lungs clear bilaterally, normal respiratory effort. CV.  Regular rate and rhythm, no JVD, rub or murmur. Abdomen.  Soft, nontender, nondistended, BS positive. CNS.  Alert and oriented .  No focal neurologic deficit. Extremities.  No edema, no cyanosis, pulses intact and symmetrical. Psychiatry.  Judgment and insight appears normal. .    Data Reviewed: Prior data reviewed  Family Communication:   Disposition: Status is: Inpatient Remains inpatient appropriate because: Severity of illness  Planned Discharge Destination: Rehab  This record has been created using Conservation officer, historic buildings. Errors have been sought and corrected,but may not always be located. Such creation errors do not reflect on the standard of care.   Time spent: 45 minutes  Author: Amaryllis Dare, MD 10/06/2023 5:33  PM  For on call review [email or web address].

## 2023-10-06 NOTE — Inpatient Diabetes Management (Signed)
 Inpatient Diabetes Program Recommendations  AACE/ADA: New Consensus Statement on Inpatient Glycemic Control   Target Ranges:  Prepandial:   less than 140 mg/dL      Peak postprandial:   less than 180 mg/dL (1-2 hours)      Critically ill patients:  140 - 180 mg/dL    Latest Reference Range & Units 10/05/23 07:45 10/05/23 12:05 10/05/23 15:51 10/05/23 20:43 10/05/23 20:58 10/06/23 00:04 10/06/23 04:09  Glucose-Capillary 70 - 99 mg/dL 708 (H) 574 (H) 764 (H) 248 (H) 254 (H) 257 (H) 224 (H)   Review of Glycemic Control  Diabetes history: DM2 Outpatient Diabetes medications: Jardiance 12.5 mg QAM, 70/30 60 units QAM, 70/30 50 units QPM, Metformin  1000 mg BID, Ozempic  2 mg Qweek Current orders for Inpatient glycemic control: Lantus  28 units at bedtime, Novolog  0-20 units TID with meals, Novolog  0-5 units at bedtime, Novolog  5 units TID with meals; Solucortef 100 mg Q12H   Inpatient Diabetes Program Recommendations:     Insulin : If steroids are continued as ordered, please consider increasing Lantus  to 31 units at bedtime and meal coverage to Novolog  8 units TID with meals.   Discharge Recommendations: Other recommendations: Freestyle Libre 3 CGM reader device (Patient lost his): Order Number [id number] Supply/Referral recommendations:  Freestyle Libre 3 CGM reader device (Patient lost his): Order Number [id number]   Use Adult Diabetes Insulin  Treatment Post Discharge order set.  Thanks, Earnie Gainer, RN, MSN, CDCES Diabetes Coordinator Inpatient Diabetes Program [phone] (Team Pager from 8am to 5pm)

## 2023-10-06 NOTE — Progress Notes (Signed)
 Physical Therapy Treatment Patient Details Name: Cristian Martin MRN: [id number] DOB: 01-17-1946 Today's Date: 10/06/2023   History of Present Illness Cristian Martin is a pleasant 78 y.o. male with medical history significant for CAD s/p CABG HFpEF, HLD, GERD, HTN, DM on insulin , anxiety who presented to ED after altered mental status, fever, nausea and vomiting. MD dx include: Acute hypoxemic respiratory failure likely due to pneumonia, congestive heart failure, HTN, diabetes, hypokalemia, and CAD.    PT Comments  Pt resting in bed upon PT arrival; pt agreeable to therapy.  During session pt was SBA to min assist with bed mobility; CGA to min assist with transfers using RW (decreased posterior lean noted today although pt was stabilizing with LE's against bed to stand); and CGA to min assist to ambulate 100 feet with RW use (limited distance ambulating d/t pt fatigue/generalized weakness; cueing required to improve gait mechanics and posture).  Pt's SpO2 sats 92% or greater on 2 L O2 via nasal cannula during sessions activities.  Will continue to focus on strengthening, balance, and progressive functional mobility during hospitalization.   If plan is discharge home, recommend the following: A lot of help with bathing/dressing/bathroom;Assistance with cooking/housework;Assist for transportation;Help with stairs or ramp for entrance;A little help with walking and/or transfers   Can travel by private vehicle        Equipment Recommendations  Other (comment) (TBD at next facility)    Recommendations for Other Services       Precautions / Restrictions Precautions Precautions: Fall Recall of Precautions/Restrictions: Impaired Restrictions Weight Bearing Restrictions Per Provider Order: No     Mobility  Bed Mobility Overal bed mobility: Needs Assistance Bed Mobility: Supine to Sit, Sit to Supine     Supine to sit: Min assist, HOB elevated, Used rails (assist for trunk) Sit to supine:  Supervision, HOB elevated, Used rails   General bed mobility comments: vc's for technique    Transfers Overall transfer level: Needs assistance Equipment used: Rolling walker (2 wheels) Transfers: Sit to/from Stand Sit to Stand: Contact guard assist, Min assist           General transfer comment: x5 trials sit to stand from bed; occasional min assist to steady; intermittent cueing for shifting weight forward and overall technique    Ambulation/Gait Ambulation/Gait assistance: Contact guard assist, Min assist Gait Distance (Feet): 100 Feet Assistive device: Rolling walker (2 wheels) Gait Pattern/deviations: Decreased step length - right, Decreased step length - left Gait velocity: decreased     General Gait Details: intermittent vc's for upright posture and longer step length; short step length and foot clearance noted B; assist for walker management; cues for positioning within walker   Stairs             Wheelchair Mobility     Tilt Bed    Modified Rankin (Stroke Patients Only)       Balance Overall balance assessment: Needs assistance Sitting-balance support: No upper extremity supported, Feet supported Sitting balance-Leahy Scale: Good Sitting balance - Comments: steady reaching within BOS   Standing balance support: Bilateral upper extremity supported, Reliant on assistive device for balance Standing balance-Leahy Scale: Poor Standing balance comment: intermittent cues and assist for balance with standing activities                            Communication Communication Communication: Impaired Factors Affecting Communication: Reduced clarity of speech  Cognition Arousal: Alert Behavior During Therapy: [facility]  for tasks assessed/performed   PT - Cognitive impairments: No apparent impairments                         Following commands: Impaired Following commands impaired: Follows one step commands with increased time     Cueing Cueing Techniques: Verbal cues, Tactile cues, Visual cues  Exercises      General Comments General comments (skin integrity, edema, etc.): HR in 80's to 90's bpm during sessions activities.  Nursing cleared pt for participation in physical therapy.  Pt agreeable to PT session.      Pertinent Vitals/Pain Pain Assessment Pain Assessment: No/denies pain    Home Living                          Prior Function            PT Goals (current goals can now be found in the care plan section) Acute Rehab PT Goals Patient Stated Goal: to get better PT Goal Formulation: With patient Time For Goal Achievement: 10/14/23 Potential to Achieve Goals: Good Progress towards PT goals: Progressing toward goals    Frequency    Min 2X/week      PT Plan      Co-evaluation              AM-PAC PT 6 Clicks Mobility   Outcome Measure  Help needed turning from your back to your side while in a flat bed without using bedrails?: A Little Help needed moving from lying on your back to sitting on the side of a flat bed without using bedrails?: A Little Help needed moving to and from a bed to a chair (including a wheelchair)?: A Little Help needed standing up from a chair using your arms (e.g., wheelchair or bedside chair)?: A Little Help needed to walk in hospital room?: A Little Help needed climbing 3-5 steps with a railing? : A Lot 6 Click Score: 17    End of Session Equipment Utilized During Treatment: Gait belt;Oxygen Activity Tolerance: Patient tolerated treatment well Patient left: in bed;with call bell/phone within reach;with bed alarm set Nurse Communication: Mobility status;Precautions PT Visit Diagnosis: Unsteadiness on feet (R26.81);Muscle weakness (generalized) (M62.81);History of falling (Z91.81);Difficulty in walking, not elsewhere classified (R26.2)     Time: 1410-1441 PT Time Calculation (min) (ACUTE ONLY): 31 min  Charges:    $Gait Training: 8-22  mins $Therapeutic Activity: 8-22 mins PT General Charges $$ ACUTE PT VISIT: 1 Visit                     Damien Caulk, PT 10/06/23, 2:54 PM

## 2023-10-06 NOTE — TOC Progression Note (Addendum)
 Transition of Care [facility]) - Progression Note    Patient Details  Name: GEARALD STONEBRAKER MRN: [id number] Date of Birth: [birth year]  Transition of Care [facility]) CM/SW Contact  Lauraine JAYSON Carpen, LCSW Phone Number: 10/06/2023, 11:37 AM  Clinical Narrative:   CSW called and spoke to [facility] social worker, Dian Solian. She will email CLC Screening Checklist to be filled out and sent along with clinicals to the [facility]. They will review and determine if they will approve a [facility]-contracted SNF. If approved, they will then send CSW a list of contracted facilities to send referral to.  1:59 pm: CSW faxed CLC checklist and clinicals to [facility].    Barriers to Discharge: Continued Medical Work up               Expected Discharge Plan and Services                                               Social Drivers of Health (SDOH) Interventions SDOH Screenings   Food Insecurity: No Food Insecurity (09/30/2023)  Housing: Low Risk  (09/30/2023)  Transportation Needs: No Transportation Needs (09/30/2023)  Utilities: Not At Risk (09/30/2023)  Depression (PHQ2-9): Medium Risk (08/23/2019)  Social Connections: Moderately Isolated (09/30/2023)  Tobacco Use: Low Risk  (09/29/2023)    Readmission Risk Interventions     No data to display

## 2023-10-07 DIAGNOSIS — R410 Disorientation, unspecified: Secondary | ICD-10-CM | POA: Diagnosis not present

## 2023-10-07 DIAGNOSIS — J181 Lobar pneumonia, unspecified organism: Secondary | ICD-10-CM | POA: Diagnosis not present

## 2023-10-07 DIAGNOSIS — A419 Sepsis, unspecified organism: Secondary | ICD-10-CM | POA: Diagnosis not present

## 2023-10-07 DIAGNOSIS — J9601 Acute respiratory failure with hypoxia: Secondary | ICD-10-CM | POA: Diagnosis not present

## 2023-10-07 LAB — GLUCOSE, CAPILLARY
Glucose-Capillary: 152 mg/dL — ABNORMAL HIGH (ref 70–99)
Glucose-Capillary: 154 mg/dL — ABNORMAL HIGH (ref 70–99)
Glucose-Capillary: 200 mg/dL — ABNORMAL HIGH (ref 70–99)
Glucose-Capillary: 248 mg/dL — ABNORMAL HIGH (ref 70–99)
Glucose-Capillary: 88 mg/dL (ref 70–99)
Glucose-Capillary: 90 mg/dL (ref 70–99)
Glucose-Capillary: 98 mg/dL (ref 70–99)

## 2023-10-07 MED ORDER — HYDROCORTISONE 10 MG PO TABS
40.0000 mg | ORAL_TABLET | Freq: Every day | ORAL | Status: AC
  Administered 2023-10-10 – 2023-10-11 (×2): 40 mg via ORAL
  Filled 2023-10-07 (×2): qty 4

## 2023-10-07 MED ORDER — INSULIN GLARGINE 100 UNIT/ML ~~LOC~~ SOLN
25.0000 [IU] | Freq: Every day | SUBCUTANEOUS | Status: DC
  Administered 2023-10-07 – 2023-10-11 (×5): 25 [IU] via SUBCUTANEOUS
  Filled 2023-10-07 (×7): qty 0.25

## 2023-10-07 MED ORDER — HYDROCORTISONE 10 MG PO TABS: 10.0000 mg | ORAL_TABLET | Freq: Every day | ORAL | Status: DC

## 2023-10-07 MED ORDER — HYDROCORTISONE 10 MG PO TABS
50.0000 mg | ORAL_TABLET | Freq: Every day | ORAL | Status: AC
  Administered 2023-10-08 – 2023-10-09 (×2): 50 mg via ORAL
  Filled 2023-10-07 (×3): qty 5

## 2023-10-07 MED ORDER — IPRATROPIUM-ALBUTEROL 0.5-2.5 (3) MG/3ML IN SOLN: 3.0000 mL | Freq: Four times a day (QID) | RESPIRATORY_TRACT | Status: DC | PRN

## 2023-10-07 MED ORDER — HYDROCORTISONE 10 MG PO TABS
20.0000 mg | ORAL_TABLET | Freq: Every day | ORAL | Status: DC
  Administered 2023-10-14: 20 mg via ORAL
  Filled 2023-10-07: qty 2

## 2023-10-07 MED ORDER — HYDROCORTISONE 10 MG PO TABS
30.0000 mg | ORAL_TABLET | Freq: Every day | ORAL | Status: AC
  Administered 2023-10-12 – 2023-10-13 (×2): 30 mg via ORAL
  Filled 2023-10-07 (×2): qty 3

## 2023-10-07 NOTE — Progress Notes (Signed)
 Occupational Therapy Treatment Patient Details Name: Cristian Martin MRN: [id number] DOB: [birth year] Today's Date: 10/07/2023   History of present illness Cristian Martin is a pleasant 78 y.o. male with medical history significant for CAD s/p CABG HFpEF, HLD, GERD, HTN, DM on insulin , anxiety who presented to ED after altered mental status, fever, nausea and vomiting. MD dx include: Acute hypoxemic respiratory failure likely due to pneumonia, congestive heart failure, HTN, diabetes, hypokalemia, and CAD.   OT comments  Chart reviewed to date, pt greeted in chair, agreeable to OT tx session targeting improving functional activity tolerance in prep for ADL tasks. Pt is making progress towards goals, however continues to perform ADL/IADL below PLOF. Spo2 >90% on 3L via [place] throughout. See further details below. Discharge recommendation remains appropriate. OT will continue to follow.       If plan is discharge home, recommend the following:  A lot of help with walking and/or transfers;A lot of help with bathing/dressing/bathroom   Equipment Recommendations  BSC/3in1    Recommendations for Other Services      Precautions / Restrictions Precautions Precautions: Fall Recall of Precautions/Restrictions: Impaired Restrictions Weight Bearing Restrictions Per Provider Order: No       Mobility Bed Mobility               General bed mobility comments: NT in recliner pre/post session    Transfers Overall transfer level: Needs assistance Equipment used: Rolling walker (2 wheels) Transfers: Sit to/from Stand Sit to Stand: Min assist           General transfer comment: multiple attempts, frequent verbal cues for technique     Balance Overall balance assessment: Needs assistance Sitting-balance support: No upper extremity supported, Feet supported Sitting balance-Leahy Scale: Good     Standing balance support: Bilateral upper extremity supported, Reliant on assistive device for  balance Standing balance-Leahy Scale: Poor                             ADL either performed or assessed with clinical judgement   ADL Overall ADL's : Needs assistance/impaired Eating/Feeding: Supervision/ safety;Sitting   Grooming: Oral care;Standing;Minimal assistance Grooming Details (indicate cue type and reason): with RW at sink level     Lower Body Bathing: Maximal assistance;Sitting/lateral leans       Lower Body Dressing: Minimal assistance;Sit to/from stand Lower Body Dressing Details (indicate cue type and reason): doff/donn brief Toilet Transfer: Minimal assistance;Rolling walker (2 wheels);Ambulation Toilet Transfer Details (indicate cue type and reason): simulated Toileting- Clothing Manipulation and Hygiene: Maximal assistance;Sit to/from stand Toileting - Clothing Manipulation Details (indicate cue type and reason): incontinent BM in brief     Functional mobility during ADLs: Minimal assistance;Rolling walker (2 wheels) (approx 10' two attempts with RW)      Extremity/Trunk Assessment              Vision       Perception     Praxis     Communication Communication Communication: Impaired Factors Affecting Communication: Reduced clarity of speech   Cognition Arousal: Alert Behavior During Therapy: WFL for tasks assessed/performed Cognition: Cognition impaired   Orientation impairments: Situation     Attention impairment (select first level of impairment): Selective attention Executive functioning impairment (select all impairments): Problem solving                   Following commands: Impaired Following commands impaired: Follows one step commands with increased time  Cueing   Cueing Techniques: Verbal cues, Tactile cues, Visual cues  Exercises Other Exercises Other Exercises: edu re role of OT, role of rehab, importance of progressing mobility    Shoulder Instructions       General Comments spo2 >90% on 3L  via [place] throughout    Pertinent Vitals/ Pain       Pain Assessment Pain Assessment: No/denies pain  Home Living                                          Prior Functioning/Environment              Frequency  Min 2X/week        Progress Toward Goals  OT Goals(current goals can now be found in the care plan section)  Progress towards OT goals: Progressing toward goals  Acute Rehab OT Goals Time For Goal Achievement: 10/14/23  Plan      Co-evaluation                 AM-PAC OT 6 Clicks Daily Activity     Outcome Measure   Help from another person eating meals?: None Help from another person taking care of personal grooming?: A Little Help from another person toileting, which includes using toliet, bedpan, or urinal?: A Lot Help from another person bathing (including washing, rinsing, drying)?: A Lot Help from another person to put on and taking off regular upper body clothing?: A Little Help from another person to put on and taking off regular lower body clothing?: A Lot 6 Click Score: 16    End of Session Equipment Utilized During Treatment: Oxygen;Rolling walker (2 wheels)  OT Visit Diagnosis: Other abnormalities of gait and mobility (R26.89);Muscle weakness (generalized) (M62.81)   Activity Tolerance Patient tolerated treatment well   Patient Left in chair;with call bell/phone within reach;with chair alarm set   Nurse Communication Mobility status        Time: 8582-8565 OT Time Calculation (min): 17 min  Charges: OT General Charges $OT Visit: 1 Visit OT Treatments $Therapeutic Activity: 8-22 mins  Therisa Sheffield, OTD OTR/L  10/07/23, 3:39 PM

## 2023-10-07 NOTE — TOC Progression Note (Signed)
 Transition of Care [facility]) - Progression Note    Patient Details  Name: Cristian Martin MRN: [id number] Date of Birth: [birth year]  [facility] CM/SW Contact  Lauraine JAYSON Carpen, LCSW Phone Number: 10/07/2023, 3:20 PM  Clinical Narrative:   Still awaiting call back with SNF auth decision from [facility].    Barriers to Discharge: Continued Medical Work up               Expected Discharge Plan and Services                                               Social Drivers of Health (SDOH) Interventions SDOH Screenings   Food Insecurity: No Food Insecurity (09/30/2023)  Housing: Low Risk  (09/30/2023)  Transportation Needs: No Transportation Needs (09/30/2023)  Utilities: Not At Risk (09/30/2023)  Depression (PHQ2-9): Medium Risk (08/23/2019)  Social Connections: Moderately Isolated (09/30/2023)  Tobacco Use: Low Risk  (09/29/2023)    Readmission Risk Interventions     No data to display

## 2023-10-07 NOTE — Progress Notes (Signed)
 Physical Therapy Treatment Patient Details Name: Cristian Martin MRN: [id number] DOB: 1945/07/28 Today's Date: 10/07/2023   History of Present Illness Cristian Martin is a pleasant 78 y.o. male with medical history significant for CAD s/p CABG HFpEF, HLD, GERD, HTN, DM on insulin , anxiety who presented to ED after altered mental status, fever, nausea and vomiting. MD dx include: Acute hypoxemic respiratory failure likely due to pneumonia, congestive heart failure, HTN, diabetes, hypokalemia, and CAD.    PT Comments  Pt resting in bed upon PT arrival; pt agreeable to therapy.  No c/o pain during session.  Pt able to sit up on EOB with mod assist; stand with min assist up to RW (posterior lean noted and B LE's stabilizing against bed); and min assist to ambulate 110 feet with RW use (pt with improved step length with assisted walker forward progress; vc's to stay in middle of walker--tends to walk on R side of walker).  Pt on 3 L O2 via nasal cannula upon PT arrival and was 94% or greater during first part of ambulation (x60 feet); MD Amin observed pt walking in hallway and asked to trial pt on room air with ambulation; pt 92% or greater rest of 40 feet of ambulation on room air; after pt sat down in recliner, pt's SpO2 sats gradually decreased to 88-89% so therapist placed pt back on supplemental O2 (pt required 3 L supplemental O2 to keep O2 sats at 92%); nurse updated on pt's status and O2/supplemental O2 needs.  Will continue to focus on strengthening, balance, and progressive functional mobility during hospitalization.   If plan is discharge home, recommend the following: A lot of help with bathing/dressing/bathroom;Assistance with cooking/housework;Assist for transportation;Help with stairs or ramp for entrance;A little help with walking and/or transfers   Can travel by private vehicle        Equipment Recommendations  Other (comment) (TBD at next facility)    Recommendations for Other Services        Precautions / Restrictions Precautions Precautions: Fall Recall of Precautions/Restrictions: Impaired Restrictions Weight Bearing Restrictions Per Provider Order: No     Mobility  Bed Mobility Overal bed mobility: Needs Assistance Bed Mobility: Supine to Sit     Supine to sit: Mod assist, HOB elevated, Used rails     General bed mobility comments: assist for trunk and scooting to EOB (pt feeling stuck in bed)    Transfers Overall transfer level: Needs assistance Equipment used: Rolling walker (2 wheels) Transfers: Sit to/from Stand Sit to Stand: Min assist           General transfer comment: vc's to scoot to EOB, B UE/LE positioning, and overall technique; vc's to shift weight forward when standing (pt pushing B LE's against bed to stabilize)    Ambulation/Gait Ambulation/Gait assistance: Min assist Gait Distance (Feet): 110 Feet Assistive device: Rolling walker (2 wheels) Gait Pattern/deviations: Decreased step length - right, Decreased step length - left Gait velocity: decreased     General Gait Details: intermittent vc's for upright posture and longer step length; short step length and foot clearance noted B in general; assist for walker management (pt with better step length when therapist assisting with steady continuous forward movement of walker); cues for positioning within walker (tends to be on R side of walker)   Stairs             Wheelchair Mobility     Tilt Bed    Modified Rankin (Stroke Patients Only)  Balance Overall balance assessment: Needs assistance Sitting-balance support: No upper extremity supported, Feet supported Sitting balance-Leahy Scale: Good Sitting balance - Comments: steady reaching within BOS   Standing balance support: Bilateral upper extremity supported, Reliant on assistive device for balance Standing balance-Leahy Scale: Poor Standing balance comment: intermittent cues and assist for balance with  standing activities; has posterior bias when standing                            Communication Communication Communication: Impaired Factors Affecting Communication: Reduced clarity of speech  Cognition Arousal: Alert Behavior During Therapy: WFL for tasks assessed/performed   PT - Cognitive impairments: No apparent impairments                         Following commands: Impaired Following commands impaired: Follows one step commands with increased time    Cueing Cueing Techniques: Verbal cues, Tactile cues, Visual cues  Exercises      General Comments  Nursing cleared pt for participation in physical therapy.  Pt agreeable to PT session.      Pertinent Vitals/Pain Pain Assessment Pain Assessment: No/denies pain HR 80's to 90's bpm during session.    Home Living                          Prior Function            PT Goals (current goals can now be found in the care plan section) Acute Rehab PT Goals Patient Stated Goal: to get better PT Goal Formulation: With patient Time For Goal Achievement: 10/14/23 Potential to Achieve Goals: Good Progress towards PT goals: Progressing toward goals    Frequency    Min 2X/week      PT Plan      Co-evaluation              AM-PAC PT 6 Clicks Mobility   Outcome Measure  Help needed turning from your back to your side while in a flat bed without using bedrails?: A Little Help needed moving from lying on your back to sitting on the side of a flat bed without using bedrails?: A Lot Help needed moving to and from a bed to a chair (including a wheelchair)?: A Little Help needed standing up from a chair using your arms (e.g., wheelchair or bedside chair)?: A Little Help needed to walk in hospital room?: A Little Help needed climbing 3-5 steps with a railing? : A Lot 6 Click Score: 16    End of Session Equipment Utilized During Treatment: Gait belt;Oxygen Activity Tolerance: Patient  tolerated treatment well Patient left: in chair;with call bell/phone within reach;with chair alarm set;with nursing/sitter in room Nurse Communication: Mobility status;Precautions;Other (comment) (Pt's vitals and SpO2 sats/supplemental O2 needs during session) PT Visit Diagnosis: Unsteadiness on feet (R26.81);Muscle weakness (generalized) (M62.81);History of falling (Z91.81);Difficulty in walking, not elsewhere classified (R26.2)     Time: 8870-8844 PT Time Calculation (min) (ACUTE ONLY): 26 min  Charges:    $Gait Training: 8-22 mins $Therapeutic Activity: 8-22 mins PT General Charges $$ ACUTE PT VISIT: 1 Visit                     Damien Caulk, PT 10/07/23, 12:08 PM

## 2023-10-07 NOTE — Progress Notes (Signed)
 Progress Note   Patient: Cristian Martin FMW:[id number] DOB: [birth year] DOA: 09/29/2023     8 DOS: the patient was seen and examined on 10/07/2023   Brief hospital course: 78 y.o. male with medical history significant for CAD s/p CABG HFpEF, HLD, GERD, HTN, DM on insulin , anxiety who presented to ED after altered mental status, fever, nausea and vomiting.  EMS was called out by the patient's wife who last saw the patient normal last night.  This morning she found the patient to be very somnolent, less responsive, covered with vomitus which presumably happened overnight.  EMS states roommate air saturation was 82% with no baseline oxygen requirement, placed the patient on nonrebreather found to have a 102.6 F temperature, transported to emergency room for further evaluation. Patient was seen and examined at bedside in the emergency room.  Patient was alert awake and oriented.  He stated that he was okay while he went to the bed last night.  But he was not able to tell me what happened in the night and this morning.  He was able to tell me he is in the hospital and he was able to tell me the date, year and the month.   ED Course: Upon arrival to the ED, patient is found to be alert awake, was able to tell his name but unable to tell where is he and what is the date.  Chest x-ray showed right midlung opacity concerning for pneumonia, leukopenia at 2.9, hypoxemia with normal lactic acid at 1.6.  Respiratory panel were negative patient required humidified high flow oxygen via nasal cannula at 15 L in upper 90s saturation.  Patient was given vancomycin  metronidazole  and cefepime  and 1 L of lactated Ringer 's bolus and hospitalist service was consulted for evaluation for admission for possible pneumonia and acute hypoxic respiratory failure  8/29.  Called this morning for hypoglycemia.  Patient was mentating and following commands.  Sugar confirmed low on lab draw and was replaced.  Patient received Lantus  20 units  last night.  Normally takes 70/30 insulin  60 units twice a day.  Patient admitted with acute respiratory failure on 15 L high flow nasal cannula secondary to pneumonia right side.  8/30.  Patient had a rough night did not sleep pulled out numerous IVs and wanting to go home.  Called to see patient first.  Patient delirious.  Family at bedside.  9/1.  Patient still on 10 L high flow nasal cannula.  Feeling better.  Sitting up eating lunch.  Pulmonary started steroids.  9/3: Vital stable on 5 L of oxygen, CBG elevated-increasing Lantus  to 28 and meal coverage to 5 as patient is on steroid now  9/4: Vital stable now on 2 L of oxygen.  CBG remained elevated so increasing Lantus  to 30 and mealtime coverage to 7.  Started tapering steroid.  TOC is working on placement.  9/5: Remained hemodynamically stable, awaiting [facility] approval for SNF.  CBG now less than 100 as we started tapering steroids so decreasing Lantus  to 25 units.  Assessment and Plan: * Severe sepsis (HCC) Present on admission with fever, tachycardia, tachypnea, hypoxia (acute respiratory failure), leukopenia.  Patient has lobar pneumonia on right side.  Since MRSA PCR negative vancomycin  was discontinued.  Patient on Rocephin  and completed Zithromax .  Pulmonary started steroids. - Started tapering steroid now  Acute delirium Continue Seroquel  at night and as needed Haldol .    Lobar pneumonia (HCC) Right-sided pneumonia.  Continue Rocephin  and completed Zithromax .  Since MRSA  PCR negative vancomycin  discontinued.  Nebulizers and incentive spirometry.  Acute hypoxemic respiratory failure (HCC) Initial pulse ox 88% on nonrebreather mask.  Patient on 15 L high flow nasal cannula for 2 days.   Pulmonary started him on steroids with improvement in oxygen requirement, currently weaned to 2 L. - Continue supplemental oxygen-wean as tolerated - Started tapering steroid  Uncontrolled type 2 diabetes mellitus with hyperglycemia, with  long-term current use of insulin  (HCC) CBG now less than 100 as we started tapering steroid -Decrease the dose of Lantus  to 25 unit - Continue mealtime coverage to 7 unit -Continue with current SSI  Hypoglycemia level 3 Sugar 38 on the morning of 8/29.   Now patient has hyperglycemia.  AKI (acute kidney injury) (HCC) Creatinine improved 1.05.  S/p IV fluid -Monitor renal function  Depression Psychiatry restarted Cymbalta .  No safety issues noted by psychiatry.  Hypomagnesemia Replaced  Hypokalemia Resolved after repletion  Malignant neoplasm of prostate (HCC) Follow-up as outpatient  Hyperlipidemia, mixed On Crestor   GERD (gastroesophageal reflux disease) On Protonix   Benign essential hypertension Continue Coreg  and hold Lasix    Subjective: Patient was working with PT when seen today.  No new concern.  Continue to improve.  Physical Exam: Vitals:   10/07/23 0414 10/07/23 0730 10/07/23 1128 10/07/23 1534  BP: 133/70 104/79 130/76 121/75  Pulse: 73     Resp: 17     Temp: 97.8 F (36.6 C) 98.2 F (36.8 C) 98.4 F (36.9 C) 97.9 F (36.6 C)  TempSrc: Oral Axillary Axillary Axillary  SpO2: 97%  96% 95%  Weight:      Height:       General.  Frail elderly man, in no acute distress. Pulmonary.  Lungs clear bilaterally, normal respiratory effort. CV.  Regular rate and rhythm, no JVD, rub or murmur. Abdomen.  Soft, nontender, nondistended, BS positive. CNS.  Alert and oriented .  No focal neurologic deficit. Extremities.  No edema, no cyanosis, pulses intact and symmetrical. Psychiatry.  Judgment and insight appears normal.     Data Reviewed: Prior data reviewed  Family Communication:   Disposition: Status is: Inpatient Remains inpatient appropriate because: Severity of illness  Planned Discharge Destination: Rehab  This record has been created using Conservation officer, historic buildings. Errors have been sought and corrected,but may not always be located.  Such creation errors do not reflect on the standard of care.   Time spent: 44 minutes  Author: Amaryllis Dare, MD 10/07/2023 5:43 PM  For on call review [email or web address].

## 2023-10-07 NOTE — Inpatient Diabetes Management (Signed)
 Inpatient Diabetes Program Recommendations  AACE/ADA: New Consensus Statement on Inpatient Glycemic Control   Target Ranges:  Prepandial:   less than 140 mg/dL      Peak postprandial:   less than 180 mg/dL (1-2 hours)      Critically ill patients:  140 - 180 mg/dL    Latest Reference Range & Units 09/29/23 09:18  Hemoglobin A1C 4.8 - 5.6 % 13.4 (H)    Latest Reference Range & Units 10/06/23 08:01 10/06/23 11:46 10/06/23 17:22 10/06/23 19:49 10/07/23 00:10 10/07/23 04:11 10/07/23 07:34  Glucose-Capillary 70 - 99 mg/dL 722 (H) 648 (H) 726 (H) 159 (H) 98 90 88  Review of Glycemic Control  Diabetes history: DM2 Outpatient Diabetes medications: Jardiance 12.5 mg QAM, 70/30 60 units QAM, 70/30 50 units QPM, Metformin  1000 mg BID, Ozempic  2 mg Qweek  Current orders for Inpatient glycemic control: Lantus  30 units at bedtime, Novolog  0-20 units TID with meals, Novolog  0-5 units at bedtime, Novolog  7 units TID with meals; Solucortef 100 mg daily.    Inpatient Diabetes Program Recommendations:   Noted: Solucortef now decreased to 100 mg daily. Patient's CBG this morning is 88 mg/dl; however, postprandial blood sugars are significantly elevated.   Insulin : Please consider decreasing Lantus  to 25 units daily and increasing meal coverage to Novolog  11 units TID with meals.   Thanks,  Lavanda Search, RN, MSN, [facility]  Inpatient Diabetes Coordinator  Pager [phone] (8a-5p)    Discharge Recommendations: Other recommendations: Freestyle Libre 3 CGM reader device (Patient lost his): Order Number [id number] Supply/Referral recommendations:  Freestyle Libre 3 CGM reader device (Patient lost his): Order Number [id number]   Use Adult Diabetes Insulin  Treatment Post Discharge order set.

## 2023-10-07 NOTE — Progress Notes (Signed)
 Mobility Specialist Progress Note:    10/07/23 1405  Mobility  Activity Ambulated with assistance  Level of Assistance Minimal assist, patient does 75% or more  Assistive Device Front wheel walker  Distance Ambulated (ft) 75 ft  Range of Motion/Exercises Active;All extremities  Activity Response Tolerated well  Mobility visit 1 Mobility  Mobility Specialist Start Time (ACUTE ONLY) 1348  Mobility Specialist Stop Time (ACUTE ONLY) 1404  Mobility Specialist Time Calculation (min) (ACUTE ONLY) 16 min   Pt received in chair, agreeable to mobility. MinA to stand and ambulate with RW. Tolerated well, SpO2 93% on 3L throughout. Returned to chair, alarm on and belongings in reach. All needs met.  Sherrilee Ditty Mobility Specialist Please contact via Special educational needs teacher or  Rehab office at [phone]

## 2023-10-08 DIAGNOSIS — A419 Sepsis, unspecified organism: Secondary | ICD-10-CM | POA: Diagnosis not present

## 2023-10-08 DIAGNOSIS — R410 Disorientation, unspecified: Secondary | ICD-10-CM | POA: Diagnosis not present

## 2023-10-08 DIAGNOSIS — J189 Pneumonia, unspecified organism: Secondary | ICD-10-CM | POA: Diagnosis not present

## 2023-10-08 DIAGNOSIS — J181 Lobar pneumonia, unspecified organism: Secondary | ICD-10-CM | POA: Diagnosis not present

## 2023-10-08 DIAGNOSIS — Z789 Other specified health status: Secondary | ICD-10-CM | POA: Diagnosis not present

## 2023-10-08 DIAGNOSIS — Z515 Encounter for palliative care: Secondary | ICD-10-CM | POA: Diagnosis not present

## 2023-10-08 DIAGNOSIS — J9601 Acute respiratory failure with hypoxia: Secondary | ICD-10-CM | POA: Diagnosis not present

## 2023-10-08 LAB — GLUCOSE, CAPILLARY
Glucose-Capillary: 126 mg/dL — ABNORMAL HIGH (ref 70–99)
Glucose-Capillary: 133 mg/dL — ABNORMAL HIGH (ref 70–99)
Glucose-Capillary: 136 mg/dL — ABNORMAL HIGH (ref 70–99)
Glucose-Capillary: 155 mg/dL — ABNORMAL HIGH (ref 70–99)
Glucose-Capillary: 196 mg/dL — ABNORMAL HIGH (ref 70–99)
Glucose-Capillary: 203 mg/dL — ABNORMAL HIGH (ref 70–99)

## 2023-10-08 NOTE — Progress Notes (Signed)
 Palliative Care Progress Note, Assessment & Plan   Patient Name: Cristian Martin       Date: 10/08/2023 DOB: 15-Sep-1945  Age: 78 y.o. MRN#: [id number] Attending Physician: Caleen Qualia, MD Primary Care Physician: [facility] Date: 09/29/2023  Subjective: Denies pain. Slept well. Ate most of breakfast. Denies SOB or CP. Looking forward to going to SNF so he can get back home.   HPI: 78 y.o. male  with past medical history significant for CAD s/p CABG, HTN, HFpEF, GERD, DM II, HLD and peripheral neuropathy. Patient presented to ED 09/29/2023 c/o AMS, fever and N/V. EMS reports 82% O2 on RA and temp 102.6. He was placed on NRB and transported to ED. On arrival, pt was A&O, stating he was okay when he went to bed but unable to tell what happened overnight.    ED labs significant for WBC 2.9, lactic acid 1.6, K+ 3.0, glucose 64. CXR showed R midlung opacity concerning for PNA. Respiratory panel negative. Pt required HHFNC 15 L with SpO2 in the upper 90s.   TRH was consulted for admission and management of acute hypoxemic respiratory failure and community-acquired pneumonia versus aspiration pneumonia.   Palliative was consulted to assist with goals of care conversations.  Summary of counseling/coordination of care: Extensive chart review completed prior to meeting patient including labs, vital signs, imaging, progress notes, orders, and available advanced directive documents from current and previous encounters.   After reviewing the patient's chart and assessing the patient at bedside, I spoke with patient in regards to symptom management and goals of care.   Ill-appearing, elderly male lying in bed. He is alert and oriented to self, time, location and situation. He is able to participate in  conversation making his wishes known. Respirations are even and unlabored. He is in no distress.   Patient shares that oxygen was removed yesterday and he has not had any side effects. He states wife and daughter should be arriving soon for visit. He is ready to transfer to rehab to be able to get his strength back, but is ready to go home.  [name] thinks that chaplain came and completed HPOA paperwork but he is not completely sure.   Therapeutic silence and active listening provided for patient to share his thoughts and emotions regarding current medical situation.  Emotional support provided.  [name] shares appreciation for the care that he has received during his admission.   Physical Exam Vitals reviewed.  Constitutional:      General: He is not in acute distress.    Appearance: He is ill-appearing.  HENT:     Head: Normocephalic and atraumatic.     Mouth/Throat:     Mouth: Mucous membranes are moist.  Pulmonary:     Effort: Pulmonary effort is normal. No respiratory distress.  Skin:    General: Skin is warm and dry.  Neurological:     Mental Status: He is alert and oriented to person, place, and time.     Motor: Weakness present.  Psychiatric:        Mood and Affect: Mood normal.        Behavior: Behavior normal.  Thought Content: Thought content normal.        Judgment: Judgment normal.         Recommendations/Plan: FULL CODE status as previously documented   MOST form completed, uploaded via [employer] with original placed in chart Continue current supportive interventions PMT will follow for peripherally for needs              Total Time 50 minutes   Time spent includes: Detailed review of medical records (labs, imaging, vital signs), medically appropriate exam (mental status, respiratory, cardiac, skin), discussed with treatment team, counseling and educating patient, family and staff, documenting clinical information, medication management and coordination of  care.     Devere Sacks, [facility] Palliative Medicine Team  10/08/2023 8:19 AM  Office [phone]  Pager [phone]

## 2023-10-08 NOTE — Progress Notes (Signed)
 Mobility Specialist - Progress Note     10/08/23 1747  Mobility  Activity Ambulated with assistance;Stood at bedside;Turned to right side;Turned to left side  Level of Assistance Contact guard assist, steadying assist  Assistive Device Front wheel walker  Distance Ambulated (ft) 65 ft  Range of Motion/Exercises Active  Activity Response Tolerated well  Mobility Referral Yes  Mobility visit 1 Mobility  Mobility Specialist Start Time (ACUTE ONLY) 1732  Mobility Specialist Stop Time (ACUTE ONLY) 1747  Mobility Specialist Time Calculation (min) (ACUTE ONLY) 15 min   Pt resting in bed on RA upon entry. Pt STS x2 and ambulates to hallway around NS CGA with RW. Pt returned to bed and left with needs in reach. Bed alarm activated.   Guido Rumble Mobility Specialist 10/08/23, 5:49 PM

## 2023-10-08 NOTE — Plan of Care (Signed)

## 2023-10-08 NOTE — Plan of Care (Signed)
  Problem: Education: Goal: Ability to describe self-care measures that may prevent or decrease complications (Diabetes Survival Skills Education) will improve Outcome: Progressing   Problem: Education: Goal: Individualized Educational Video(s) Outcome: Progressing   Problem: Health Behavior/Discharge Planning: Goal: Ability to identify and utilize available resources and services will improve Outcome: Progressing   Problem: Health Behavior/Discharge Planning: Goal: Ability to manage health-related needs will improve Outcome: Progressing   Problem: Metabolic: Goal: Ability to maintain appropriate glucose levels will improve Outcome: Progressing

## 2023-10-08 NOTE — Progress Notes (Signed)
 Progress Note   Patient: Cristian Martin FMW:[id number] DOB: Aug 20, 1945 DOA: 09/29/2023     9 DOS: the patient was seen and examined on 10/08/2023   Brief hospital course: 78 y.o. male with medical history significant for CAD s/p CABG HFpEF, HLD, GERD, HTN, DM on insulin , anxiety who presented to ED after altered mental status, fever, nausea and vomiting.  EMS was called out by the patient's wife who last saw the patient normal last night.  This morning she found the patient to be very somnolent, less responsive, covered with vomitus which presumably happened overnight.  EMS states roommate air saturation was 82% with no baseline oxygen requirement, placed the patient on nonrebreather found to have a 102.6 F temperature, transported to emergency room for further evaluation. Patient was seen and examined at bedside in the emergency room.  Patient was alert awake and oriented.  He stated that he was okay while he went to the bed last night.  But he was not able to tell me what happened in the night and this morning.  He was able to tell me he is in the hospital and he was able to tell me the date, year and the month.   ED Course: Upon arrival to the ED, patient is found to be alert awake, was able to tell his name but unable to tell where is he and what is the date.  Chest x-ray showed right midlung opacity concerning for pneumonia, leukopenia at 2.9, hypoxemia with normal lactic acid at 1.6.  Respiratory panel were negative patient required humidified high flow oxygen via nasal cannula at 15 L in upper 90s saturation.  Patient was given vancomycin  metronidazole  and cefepime  and 1 L of lactated Ringer 's bolus and hospitalist service was consulted for evaluation for admission for possible pneumonia and acute hypoxic respiratory failure  8/29.  Called this morning for hypoglycemia.  Patient was mentating and following commands.  Sugar confirmed low on lab draw and was replaced.  Patient received Lantus  20 units  last night.  Normally takes 70/30 insulin  60 units twice a day.  Patient admitted with acute respiratory failure on 15 L high flow nasal cannula secondary to pneumonia right side.  8/30.  Patient had a rough night did not sleep pulled out numerous IVs and wanting to go home.  Called to see patient first.  Patient delirious.  Family at bedside.  9/1.  Patient still on 10 L high flow nasal cannula.  Feeling better.  Sitting up eating lunch.  Pulmonary started steroids.  9/3: Vital stable on 5 L of oxygen, CBG elevated-increasing Lantus  to 28 and meal coverage to 5 as patient is on steroid now  9/4: Vital stable now on 2 L of oxygen.  CBG remained elevated so increasing Lantus  to 30 and mealtime coverage to 7.  Started tapering steroid.  TOC is working on placement.  9/5: Remained hemodynamically stable, awaiting [facility].  CBG now less than 100 as we started tapering steroids so decreasing Lantus  to 25 units.  9/6: Hemodynamically stable, now on room air.  CBG now within goal.  Awaiting SNF placement  Assessment and Plan: * Severe sepsis (HCC) Present on admission with fever, tachycardia, tachypnea, hypoxia (acute respiratory failure), leukopenia.  Patient has lobar pneumonia on right side.  Since MRSA PCR negative vancomycin  was discontinued.  Patient on Rocephin  and completed Zithromax .  Pulmonary started steroids. - Started tapering steroid now  Acute delirium Continue Seroquel  at night and as needed Haldol .  Lobar pneumonia (HCC) Right-sided pneumonia.  Continue Rocephin  and completed Zithromax .  Since MRSA PCR negative vancomycin  discontinued.  Nebulizers and incentive spirometry.  Acute hypoxemic respiratory failure (HCC) Initial pulse ox 88% on nonrebreather mask.  Patient on 15 L high flow nasal cannula for 2 days.   Pulmonary started him on steroids with improvement in oxygen requirement, now on room air - Continue with steroid taper  Uncontrolled type 2 diabetes  mellitus with hyperglycemia, with long-term current use of insulin  (HCC) CBG within goal - Continue the dose of Lantus  to 25 unit - Continue mealtime coverage to 7 unit -Continue with current SSI  Hypoglycemia level 3 Sugar 38 on the morning of 8/29.   Now patient has hyperglycemia.  AKI (acute kidney injury) (HCC) Creatinine improved 1.05.  S/p IV fluid -Monitor renal function  Depression Psychiatry restarted Cymbalta .  No safety issues noted by psychiatry.  Hypomagnesemia Replaced  Hypokalemia Resolved after repletion  Malignant neoplasm of prostate (HCC) Follow-up as outpatient  Hyperlipidemia, mixed On Crestor   GERD (gastroesophageal reflux disease) On Protonix   Benign essential hypertension Continue Coreg  and hold Lasix    Subjective: Patient was seen and examined today.  Denies any shortness of breath, he was on room air.  Physical Exam: Vitals:   10/07/23 2311 10/08/23 0513 10/08/23 0742 10/08/23 1201  BP: (!) 153/81 (!) 148/82 (!) 152/83 (!) 143/72  Pulse: 80 76 77 80  Resp: 18 17 16    Temp: 97.8 F (36.6 C) 97.6 F (36.4 C) 97.7 F (36.5 C) 98.1 F (36.7 C)  TempSrc: Oral  Oral Oral  SpO2: 94% 96% 95% 93%  Weight:      Height:       General.  Frail elderly man, in no acute distress. Pulmonary.  Lungs clear bilaterally, normal respiratory effort. CV.  Regular rate and rhythm, no JVD, rub or murmur. Abdomen.  Soft, nontender, nondistended, BS positive. CNS.  Alert and oriented .  No focal neurologic deficit. Extremities.  No edema, no cyanosis, pulses intact and symmetrical. Psychiatry.  Judgment and insight appears normal.    Data Reviewed: Prior data reviewed  Family Communication: talked with wife on phone.  Disposition: Status is: Inpatient Remains inpatient appropriate because: Severity of illness  Planned Discharge Destination: Rehab  This record has been created using Conservation officer, historic buildings. Errors have been sought and  corrected,but may not always be located. Such creation errors do not reflect on the standard of care.   Time spent: 43 minutes  Author: Amaryllis Dare, MD 10/08/2023 2:38 PM  For on call review [email or web address].

## 2023-10-09 DIAGNOSIS — J9601 Acute respiratory failure with hypoxia: Secondary | ICD-10-CM | POA: Diagnosis not present

## 2023-10-09 DIAGNOSIS — R410 Disorientation, unspecified: Secondary | ICD-10-CM | POA: Diagnosis not present

## 2023-10-09 DIAGNOSIS — J181 Lobar pneumonia, unspecified organism: Secondary | ICD-10-CM | POA: Diagnosis not present

## 2023-10-09 DIAGNOSIS — A419 Sepsis, unspecified organism: Secondary | ICD-10-CM | POA: Diagnosis not present

## 2023-10-09 LAB — GLUCOSE, CAPILLARY
Glucose-Capillary: 132 mg/dL — ABNORMAL HIGH (ref 70–99)
Glucose-Capillary: 133 mg/dL — ABNORMAL HIGH (ref 70–99)
Glucose-Capillary: 164 mg/dL — ABNORMAL HIGH (ref 70–99)
Glucose-Capillary: 151 mg/dL — ABNORMAL HIGH (ref 70–99)
Glucose-Capillary: 297 mg/dL — ABNORMAL HIGH (ref 70–99)

## 2023-10-09 MED ORDER — LOSARTAN POTASSIUM 25 MG PO TABS
25.0000 mg | ORAL_TABLET | Freq: Every day | ORAL | Status: DC
  Administered 2023-10-09 – 2023-10-14 (×6): 25 mg via ORAL
  Filled 2023-10-09 (×6): qty 1

## 2023-10-09 NOTE — Progress Notes (Signed)
 Physical Therapy Treatment Patient Details Name: Cristian Martin MRN: [id number] DOB: [birth year] Today's Date: 10/09/2023   History of Present Illness Cristian Martin is a pleasant 78 y.o. male with medical history significant for CAD s/p CABG HFpEF, HLD, GERD, HTN, DM on insulin , anxiety who presented to ED after altered mental status, fever, nausea and vomiting. MD dx include: Acute hypoxemic respiratory failure likely due to pneumonia, congestive heart failure, HTN, diabetes, hypokalemia, and CAD.    PT Comments  Pt ready for session.  Is able to get to EOB with rails and cga/min a to manage blankets and lines.  Steady in sitting.  Stood with cga/min a x 1 and noted to be inc large soft BM.  Pt unaware and care is given in standing.  He then continues to walk 120' with RW and min a x 1.  Forward lean on walker with dec step length and height increasing fall risk.  He is motivated and does push himself until fatigue.  He remains up in chair after session with safety on and needs met.  Will benefit from continued therapies at discharge. O2 at 2lpm during session.   If plan is discharge home, recommend the following: A lot of help with bathing/dressing/bathroom;Assistance with cooking/housework;Assist for transportation;Help with stairs or ramp for entrance;A little help with walking and/or transfers   Can travel by private vehicle        Equipment Recommendations       Recommendations for Other Services       Precautions / Restrictions       Mobility  Bed Mobility   Bed Mobility: Supine to Sit     Supine to sit: Contact guard, Min assist, Used rails       Patient Response: Cooperative  Transfers Overall transfer level: Needs assistance Equipment used: Rolling walker (2 wheels) Transfers: Sit to/from Stand Sit to Stand: Min assist                Ambulation/Gait Ambulation/Gait assistance: Editor, commissioning (Feet): 120 Feet Assistive device: Rolling walker (2  wheels) Gait Pattern/deviations: Decreased step length - right, Decreased step length - left, Trunk flexed Gait velocity: decreased     General Gait Details: dec step length and heigth reliant on RW for support   Stairs             Wheelchair Mobility     Tilt Bed Tilt Bed Patient Response: Cooperative  Modified Rankin (Stroke Patients Only)       Balance Overall balance assessment: Needs assistance Sitting-balance support: No upper extremity supported, Feet supported Sitting balance-Leahy Scale: Good     Standing balance support: Bilateral upper extremity supported, Reliant on assistive device for balance Standing balance-Leahy Scale: Poor Standing balance comment: intermittent cues and assist for balance with standing activities; has posterior bias when standing, forward lean on walker during gait                            Communication Communication Communication: Impaired Factors Affecting Communication: Reduced clarity of speech  Cognition Arousal: Alert Behavior During Therapy: WFL for tasks assessed/performed   PT - Cognitive impairments: No apparent impairments                       PT - Cognition Comments: Pt is pleasant and agreeable to PT session. Following commands: Impaired Following commands impaired: Follows one step commands with increased time  Cueing Cueing Techniques: Verbal cues, Tactile cues, Visual cues  Exercises      General Comments        Pertinent Vitals/Pain Pain Assessment Pain Assessment: No/denies pain    Home Living                          Prior Function            PT Goals (current goals can now be found in the care plan section) Progress towards PT goals: Progressing toward goals    Frequency    Min 2X/week      PT Plan      Co-evaluation              AM-PAC PT 6 Clicks Mobility   Outcome Measure  Help needed turning from your back to your side while in  a flat bed without using bedrails?: A Little Help needed moving from lying on your back to sitting on the side of a flat bed without using bedrails?: A Little Help needed moving to and from a bed to a chair (including a wheelchair)?: A Little Help needed standing up from a chair using your arms (e.g., wheelchair or bedside chair)?: A Little Help needed to walk in hospital room?: A Little Help needed climbing 3-5 steps with a railing? : A Lot 6 Click Score: 17    End of Session Equipment Utilized During Treatment: Gait belt;Oxygen Activity Tolerance: Patient tolerated treatment well Patient left: in chair;with call bell/phone within reach;with chair alarm set Nurse Communication: Mobility status;Precautions;Other (comment) PT Visit Diagnosis: Unsteadiness on feet (R26.81);Muscle weakness (generalized) (M62.81);History of falling (Z91.81);Difficulty in walking, not elsewhere classified (R26.2)     Time: 8583-8565 PT Time Calculation (min) (ACUTE ONLY): 18 min  Charges:    $Gait Training: 8-22 mins PT General Charges $$ ACUTE PT VISIT: 1 Visit                   Lauraine Gills, PTA 10/09/23, 2:52 PM

## 2023-10-09 NOTE — Plan of Care (Signed)

## 2023-10-09 NOTE — Progress Notes (Addendum)
 Progress Note   Patient: Cristian Martin FMW:[id number] DOB: 1945-12-11 DOA: 09/29/2023     10 DOS: the patient was seen and examined on 10/09/2023   Brief hospital course: 78 y.o. male with medical history significant for CAD s/p CABG HFpEF, HLD, GERD, HTN, DM on insulin , anxiety who presented to ED after altered mental status, fever, nausea and vomiting.  EMS was called out by the patient's wife who last saw the patient normal last night.  This morning she found the patient to be very somnolent, less responsive, covered with vomitus which presumably happened overnight.  EMS states roommate air saturation was 82% with no baseline oxygen requirement, placed the patient on nonrebreather found to have a 102.6 F temperature, transported to emergency room for further evaluation. Patient was seen and examined at bedside in the emergency room.  Patient was alert awake and oriented.  He stated that he was okay while he went to the bed last night.  But he was not able to tell me what happened in the night and this morning.  He was able to tell me he is in the hospital and he was able to tell me the date, year and the month.   ED Course: Upon arrival to the ED, patient is found to be alert awake, was able to tell his name but unable to tell where is he and what is the date.  Chest x-ray showed right midlung opacity concerning for pneumonia, leukopenia at 2.9, hypoxemia with normal lactic acid at 1.6.  Respiratory panel were negative patient required humidified high flow oxygen via nasal cannula at 15 L in upper 90s saturation.  Patient was given vancomycin  metronidazole  and cefepime  and 1 L of lactated Ringer 's bolus and hospitalist service was consulted for evaluation for admission for possible pneumonia and acute hypoxic respiratory failure  8/29.  Called this morning for hypoglycemia.  Patient was mentating and following commands.  Sugar confirmed low on lab draw and was replaced.  Patient received Lantus  20  units last night.  Normally takes 70/30 insulin  60 units twice a day.  Patient admitted with acute respiratory failure on 15 L high flow nasal cannula secondary to pneumonia right side.  8/30.  Patient had a rough night did not sleep pulled out numerous IVs and wanting to go home.  Called to see patient first.  Patient delirious.  Family at bedside.  9/1.  Patient still on 10 L high flow nasal cannula.  Feeling better.  Sitting up eating lunch.  Pulmonary started steroids.  9/3: Vital stable on 5 L of oxygen, CBG elevated-increasing Lantus  to 28 and meal coverage to 5 as patient is on steroid now  9/4: Vital stable now on 2 L of oxygen.  CBG remained elevated so increasing Lantus  to 30 and mealtime coverage to 7.  Started tapering steroid.  TOC is working on placement.  9/5: Remained hemodynamically stable, awaiting [facility] approval for SNF.  CBG now less than 100 as we started tapering steroids so decreasing Lantus  to 25 units.  9/6: Hemodynamically stable, now on room air.  CBG now within goal.  Awaiting SNF placement  9/7: Blood pressure now started trending up, restarting home losartan  at a lower dose of 25 mg daily.  Still pending placement  Assessment and Plan: * Severe sepsis (HCC) Present on admission with fever, tachycardia, tachypnea, hypoxia (acute respiratory failure), leukopenia.  Patient has lobar pneumonia on right side.  Since MRSA PCR negative vancomycin  was discontinued.  Patient on Rocephin   and completed Zithromax .  Pulmonary started steroids. - Started tapering steroid now  Acute delirium Continue Seroquel  at night and as needed Haldol .    Lobar pneumonia (HCC) Right-sided pneumonia.  Continue Rocephin  and completed Zithromax .  Since MRSA PCR negative vancomycin  discontinued.  Nebulizers and incentive spirometry.  Acute hypoxemic respiratory failure (HCC) Initial pulse ox 88% on nonrebreather mask.  Patient on 15 L high flow nasal cannula for 2 days.   Pulmonary started  him on steroids with improvement in oxygen requirement, now on room air - Continue with steroid taper  Uncontrolled type 2 diabetes mellitus with hyperglycemia, with long-term current use of insulin  (HCC) CBG within goal - Continue the dose of Lantus  to 25 unit - Continue mealtime coverage to 7 unit -Continue with current SSI  Hypoglycemia level 3 Sugar 38 on the morning of 8/29.   Now patient has hyperglycemia.  AKI (acute kidney injury) (HCC) Creatinine improved 1.05.  S/p IV fluid -Monitor renal function  Depression Psychiatry restarted Cymbalta .  No safety issues noted by psychiatry.  Hypomagnesemia Replaced  Hypokalemia Resolved after repletion  Malignant neoplasm of prostate (HCC) Follow-up as outpatient  Hyperlipidemia, mixed On Crestor   GERD (gastroesophageal reflux disease) On Protonix   Benign essential hypertension - Continue Coreg  -Restarting home losartan  at a lower dose of 25 mg daily   Subjective: Patient was resting comfortably when seen today.  No new concern.  Still awaiting placement.  Physical Exam: Vitals:   10/08/23 2353 10/09/23 0440 10/09/23 0833 10/09/23 1215  BP: 128/75 (!) 143/80 (!) 144/73 136/81  Pulse: 83 75 78 78  Resp: 18 17    Temp: (!) 97.4 F (36.3 C) 97.6 F (36.4 C) 97.7 F (36.5 C) 97.7 F (36.5 C)  TempSrc: Oral Oral Oral Oral  SpO2: 94% 97% 95% 92%  Weight:      Height:       General.  Frail elderly man, in no acute distress. Pulmonary.  Lungs clear bilaterally, normal respiratory effort. CV.  Regular rate and rhythm, no JVD, rub or murmur. Abdomen.  Soft, nontender, nondistended, BS positive. CNS.  Alert and oriented .  No focal neurologic deficit. Extremities.  No edema,  pulses intact and symmetrical. Psychiatry.  Judgment and insight appears normal.    Data Reviewed: Prior data reviewed  Family Communication:   Disposition: Status is: Inpatient Remains inpatient appropriate because: Severity of  illness  Planned Discharge Destination: Rehab  This record has been created using Conservation officer, historic buildings. Errors have been sought and corrected,but may not always be located. Such creation errors do not reflect on the standard of care.   Time spent: 40 minutes  Author: Amaryllis Dare, MD 10/09/2023 2:36 PM  For on call review [email or web address].

## 2023-10-10 DIAGNOSIS — A419 Sepsis, unspecified organism: Secondary | ICD-10-CM | POA: Diagnosis not present

## 2023-10-10 DIAGNOSIS — J9601 Acute respiratory failure with hypoxia: Secondary | ICD-10-CM | POA: Diagnosis not present

## 2023-10-10 DIAGNOSIS — R410 Disorientation, unspecified: Secondary | ICD-10-CM | POA: Diagnosis not present

## 2023-10-10 DIAGNOSIS — J181 Lobar pneumonia, unspecified organism: Secondary | ICD-10-CM | POA: Diagnosis not present

## 2023-10-10 LAB — GLUCOSE, CAPILLARY
Glucose-Capillary: 115 mg/dL — ABNORMAL HIGH (ref 70–99)
Glucose-Capillary: 155 mg/dL — ABNORMAL HIGH (ref 70–99)
Glucose-Capillary: 206 mg/dL — ABNORMAL HIGH (ref 70–99)
Glucose-Capillary: 247 mg/dL — ABNORMAL HIGH (ref 70–99)
Glucose-Capillary: 250 mg/dL — ABNORMAL HIGH (ref 70–99)
Glucose-Capillary: 80 mg/dL (ref 70–99)

## 2023-10-10 NOTE — Inpatient Diabetes Management (Signed)
 Inpatient Diabetes Program Recommendations  AACE/ADA: New Consensus Statement on Inpatient Glycemic Control   Target Ranges:  Prepandial:   less than 140 mg/dL      Peak postprandial:   less than 180 mg/dL (1-2 hours)      Critically ill patients:  140 - 180 mg/dL    Latest Reference Range & Units 10/09/23 08:34 10/09/23 12:16 10/09/23 17:01 10/09/23 20:52 10/10/23 00:54 10/10/23 04:54  Glucose-Capillary 70 - 99 mg/dL 866 (H) 835 (H) 848 (H) 297 (H) 206 (H) 80    Review of Glycemic Control  Diabetes history: DM2 Outpatient Diabetes medications: Jardiance  12.5 mg QAM, 70/30 60 units QAM, 70/30 50 units QPM, Metformin  1000 mg BID, Ozempic  2 mg Qweek Current orders for Inpatient glycemic control: Lantus  25 units at bedtime, Novolog  0-20 units TID with meals, Novolog  0-5 units at bedtime, Novolog  7 units TID with meals; Cortef  40 mg daily(tapering)  Inpatient Diabetes Program Recommendations:    Insulin : CBG 80 mg/dl at 5:45 am today. Please consider decreasing Lantus  to 21 units at bedtime.  Discharge Recommendations: Other recommendations: Freestyle Libre 3 CGM reader device (Patient lost his): Order Number [id number] Supply/Referral recommendations:  Freestyle Libre 3 CGM reader device (Patient lost his): Order Number [id number]   Use Adult Diabetes Insulin  Treatment Post Discharge order set.  Thanks, Earnie Gainer, RN, MSN, CDCES Diabetes Coordinator Inpatient Diabetes Program [phone] (Team Pager from 8am to 5pm)

## 2023-10-10 NOTE — Progress Notes (Signed)
 Progress Note   Patient: Cristian Martin FMW:[id number] DOB: [birth year] DOA: 09/29/2023     11 DOS: the patient was seen and examined on 10/10/2023   Brief hospital course: 78 y.o. male with medical history significant for CAD s/p CABG HFpEF, HLD, GERD, HTN, DM on insulin , anxiety who presented to ED after altered mental status, fever, nausea and vomiting.  EMS was called out by the patient's wife who last saw the patient normal last night.  This morning she found the patient to be very somnolent, less responsive, covered with vomitus which presumably happened overnight.  EMS states roommate air saturation was 82% with no baseline oxygen requirement, placed the patient on nonrebreather found to have a 102.6 F temperature, transported to emergency room for further evaluation. Patient was seen and examined at bedside in the emergency room.  Patient was alert awake and oriented.  He stated that he was okay while he went to the bed last night.  But he was not able to tell me what happened in the night and this morning.  He was able to tell me he is in the hospital and he was able to tell me the date, year and the month.   ED Course: Upon arrival to the ED, patient is found to be alert awake, was able to tell his name but unable to tell where is he and what is the date.  Chest x-ray showed right midlung opacity concerning for pneumonia, leukopenia at 2.9, hypoxemia with normal lactic acid at 1.6.  Respiratory panel were negative patient required humidified high flow oxygen via nasal cannula at 15 L in upper 90s saturation.  Patient was given vancomycin  metronidazole  and cefepime  and 1 L of lactated Ringer 's bolus and hospitalist service was consulted for evaluation for admission for possible pneumonia and acute hypoxic respiratory failure  8/29.  Called this morning for hypoglycemia.  Patient was mentating and following commands.  Sugar confirmed low on lab draw and was replaced.  Patient received Lantus  20  units last night.  Normally takes 70/30 insulin  60 units twice a day.  Patient admitted with acute respiratory failure on 15 L high flow nasal cannula secondary to pneumonia right side.  8/30.  Patient had a rough night did not sleep pulled out numerous IVs and wanting to go home.  Called to see patient first.  Patient delirious.  Family at bedside.  9/1.  Patient still on 10 L high flow nasal cannula.  Feeling better.  Sitting up eating lunch.  Pulmonary started steroids.  9/3: Vital stable on 5 L of oxygen, CBG elevated-increasing Lantus  to 28 and meal coverage to 5 as patient is on steroid now  9/4: Vital stable now on 2 L of oxygen.  CBG remained elevated so increasing Lantus  to 30 and mealtime coverage to 7.  Started tapering steroid.  TOC is working on placement.  9/5: Remained hemodynamically stable, awaiting [facility] approval for SNF.  CBG now less than 100 as we started tapering steroids so decreasing Lantus  to 25 units.  9/6: Hemodynamically stable, now on room air.  CBG now within goal.  Awaiting SNF placement  9/7: Blood pressure now started trending up, restarting home losartan  at a lower dose of 25 mg daily.  Still pending placement  9/8: Remained hemodynamically stable, awaiting [facility] approved facility for disposition.  Assessment and Plan: * Severe sepsis (HCC) Present on admission with fever, tachycardia, tachypnea, hypoxia (acute respiratory failure), leukopenia.  Patient has lobar pneumonia on right side.  Since MRSA PCR negative vancomycin  was discontinued.  Patient on Rocephin  and completed Zithromax .  Pulmonary started steroids. - Started tapering steroid now  Acute delirium Continue Seroquel  at night and as needed Haldol .    Lobar pneumonia (HCC) Right-sided pneumonia.  Continue Rocephin  and completed Zithromax .  Since MRSA PCR negative vancomycin  discontinued.  Nebulizers and incentive spirometry.  Acute hypoxemic respiratory failure (HCC) Initial pulse ox 88% on  nonrebreather mask.  Patient on 15 L high flow nasal cannula for 2 days.   Pulmonary started him on steroids with improvement in oxygen requirement, now on room air - Continue with steroid taper  Uncontrolled type 2 diabetes mellitus with hyperglycemia, with long-term current use of insulin  (HCC) CBG within goal - Continue the dose of Lantus  to 25 unit - Continue mealtime coverage to 7 unit -Continue with current SSI  Hypoglycemia level 3 Sugar 38 on the morning of 8/29.   Now patient has hyperglycemia.  AKI (acute kidney injury) (HCC) Creatinine improved 1.05.  S/p IV fluid -Monitor renal function  Depression Psychiatry restarted Cymbalta .  No safety issues noted by psychiatry.  Hypomagnesemia Replaced  Hypokalemia Resolved after repletion  Malignant neoplasm of prostate (HCC) Follow-up as outpatient  Hyperlipidemia, mixed On Crestor   GERD (gastroesophageal reflux disease) On Protonix   Benign essential hypertension Blood pressure within goal today - Continue Coreg  - Home dose of losartan  losartan  was restarted at a lower dose of 25 mg daily-Will continue current dose   Subjective: Patient was sitting comfortably in chair when seen today.  No new concern.  Physical Exam: Vitals:   10/10/23 0452 10/10/23 0816 10/10/23 0915 10/10/23 1126  BP: 104/64 115/62  98/63  Pulse: 78 74  81  Resp: 20 17  19   Temp: 97.9 F (36.6 C) 98 F (36.7 C)  98.6 F (37 C)  TempSrc: Oral     SpO2: 94% 95% 93% 96%  Weight:      Height:       General.  Frail elderly man, in no acute distress. Pulmonary.  Lungs clear bilaterally, normal respiratory effort. CV.  Regular rate and rhythm, no JVD, rub or murmur. Abdomen.  Soft, nontender, nondistended, BS positive. CNS.  Alert and oriented .  No focal neurologic deficit. Extremities.  No edema, no cyanosis, pulses intact and symmetrical. Psychiatry.  Judgment and insight appears normal.    Data Reviewed: Prior data  reviewed  Family Communication: Discussed with patient  Disposition: Status is: Inpatient Remains inpatient appropriate because: Severity of illness  Planned Discharge Destination: Rehab  This record has been created using Conservation officer, historic buildings. Errors have been sought and corrected,but may not always be located. Such creation errors do not reflect on the standard of care.   Time spent: 39 minutes  Author: Amaryllis Dare, MD 10/10/2023 4:15 PM  For on call review [email or web address].

## 2023-10-10 NOTE — Plan of Care (Signed)

## 2023-10-10 NOTE — Progress Notes (Signed)
 Physical Therapy Treatment Patient Details Name: Cristian Martin MRN: [id number] DOB: 1945/08/10 Today's Date: 10/10/2023   History of Present Illness Cristian Martin is a pleasant 78 y.o. male with medical history significant for CAD s/p CABG HFpEF, HLD, GERD, HTN, DM on insulin , anxiety who presented to ED after altered mental status, fever, nausea and vomiting. MD dx include: Acute hypoxemic respiratory failure likely due to pneumonia, congestive heart failure, HTN, diabetes, hypokalemia, and CAD.    PT Comments  Pt ready to get up this am.  Bed mobility with bed features and encouragement but no physical assist today.  Steady in sitting.  He does need 2 attempts at standing due to feet sliding despite grip socks.  Once up he is able to progress gait 140' with generally unsteady gait with dec step length and height.  He continues to need +1 assist, hands on at all times.  Remains high fall risk.  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  Remained up in chair with needs met.   If plan is discharge home, recommend the following: A lot of help with bathing/dressing/bathroom;Assistance with cooking/housework;Assist for transportation;Help with stairs or ramp for entrance;A little help with walking and/or transfers   Can travel by private vehicle        Equipment Recommendations       Recommendations for Other Services       Precautions / Restrictions Precautions Precautions: Fall Restrictions Weight Bearing Restrictions Per Provider Order: No     Mobility  Bed Mobility Overal bed mobility: Needs Assistance Bed Mobility: Supine to Sit     Supine to sit: Used rails, Contact guard       Patient Response: Cooperative  Transfers Overall transfer level: Needs assistance Equipment used: Rolling walker (2 wheels) Transfers: Sit to/from Stand Sit to Stand: Min assist           General transfer comment: min a to block feet  due to socks sliding    Ambulation/Gait Ambulation/Gait assistance: Min assist Gait Distance (Feet): 140 Feet Assistive device: Rolling walker (2 wheels) Gait Pattern/deviations: Decreased step length - right, Decreased step length - left, Trunk flexed Gait velocity: decreased     General Gait Details: dec step length and heigth reliant on RW for support   Stairs             Wheelchair Mobility     Tilt Bed Tilt Bed Patient Response: Cooperative  Modified Rankin (Stroke Patients Only)       Balance Overall balance assessment: Needs assistance Sitting-balance support: No upper extremity supported, Feet supported Sitting balance-Leahy Scale: Good Sitting balance - Comments: steady reaching within BOS   Standing balance support: Bilateral upper extremity supported, Reliant on assistive device for balance Standing balance-Leahy Scale: Poor Standing balance comment: +1 hands on at all times, remains high fall risk                            Communication Communication Communication: Impaired Factors Affecting Communication: Reduced clarity of speech  Cognition Arousal: Alert Behavior During Therapy: WFL for tasks assessed/performed   PT - Cognitive impairments: No apparent impairments                       PT - Cognition Comments: Pt is pleasant and agreeable to PT session. Following commands: Intact      Cueing Cueing Techniques:  Verbal cues, Tactile cues, Visual cues  Exercises      General Comments        Pertinent Vitals/Pain Pain Assessment Pain Assessment: No/denies pain    Home Living                          Prior Function            PT Goals (current goals can now be found in the care plan section) Progress towards PT goals: Progressing toward goals    Frequency    Min 2X/week      PT Plan      Co-evaluation              AM-PAC PT 6 Clicks Mobility   Outcome Measure  Help needed  turning from your back to your side while in a flat bed without using bedrails?: A Little Help needed moving from lying on your back to sitting on the side of a flat bed without using bedrails?: A Little Help needed moving to and from a bed to a chair (including a wheelchair)?: A Little Help needed standing up from a chair using your arms (e.g., wheelchair or bedside chair)?: A Little Help needed to walk in hospital room?: A Little Help needed climbing 3-5 steps with a railing? : A Lot 6 Click Score: 17    End of Session Equipment Utilized During Treatment: Gait belt Activity Tolerance: Patient tolerated treatment well Patient left: in chair;with call bell/phone within reach;with chair alarm set Nurse Communication: Mobility status;Precautions;Other (comment) PT Visit Diagnosis: Unsteadiness on feet (R26.81);Muscle weakness (generalized) (M62.81);History of falling (Z91.81);Difficulty in walking, not elsewhere classified (R26.2)     Time: 9144-9089 PT Time Calculation (min) (ACUTE ONLY): 15 min  Charges:    $Gait Training: 8-22 mins PT General Charges $$ ACUTE PT VISIT: 1 Visit                   Lauraine Gills, PTA 10/10/23, 9:27 AM

## 2023-10-10 NOTE — Progress Notes (Signed)
 Mobility Specialist Progress Note:    10/10/23 1609  Mobility  Activity Ambulated with assistance  Level of Assistance Minimal assist, patient does 75% or more  Assistive Device Front wheel walker  Distance Ambulated (ft) 120 ft  Range of Motion/Exercises Active;All extremities  Activity Response Tolerated well  Mobility visit 1 Mobility  Mobility Specialist Start Time (ACUTE ONLY) 1541  Mobility Specialist Stop Time (ACUTE ONLY) 1604  Mobility Specialist Time Calculation (min) (ACUTE ONLY) 23 min     Verline Kong Mobility Specialist Please contact via Special educational needs teacher or  Rehab office at [phone]

## 2023-10-10 NOTE — Plan of Care (Signed)
   Problem: Education: Goal: Ability to describe self-care measures that may prevent or decrease complications (Diabetes Survival Skills Education) will improve Outcome: Progressing Goal: Individualized Educational Video(s) Outcome: Progressing   Problem: Coping: Goal: Ability to adjust to condition or change in health will improve Outcome: Progressing

## 2023-10-10 NOTE — Progress Notes (Signed)
 Mobility Specialist Progress Note:    10/10/23 1322  Mobility  Activity Ambulated with assistance  Level of Assistance Minimal assist, patient does 75% or more  Assistive Device Front wheel walker  Distance Ambulated (ft) 90 ft  Range of Motion/Exercises Active;All extremities  Activity Response Tolerated well  Mobility visit 1 Mobility  Mobility Specialist Start Time (ACUTE ONLY) 1600  Mobility Specialist Stop Time (ACUTE ONLY) 1616  Mobility Specialist Time Calculation (min) (ACUTE ONLY) 16 min   Pt received in chair, eager for mobility. MinA to stand and ambulate w RW. Tolerated well, SpO2 95% on RA throughout session. Returned to chair, NT in room. All needs met.  Sherrilee Ditty Mobility Specialist Please contact via Special educational needs teacher or  Rehab office at [phone]

## 2023-10-10 NOTE — TOC Progression Note (Addendum)
 Transition of Care [facility]) - Progression Note    Patient Details  Name: Cristian Martin MRN: [id number] Date of Birth: Jun 15, 1945  [facility] CM/SW Contact  Lauraine JAYSON Carpen, LCSW Phone Number: 10/10/2023, 9:39 AM  Clinical Narrative:   Emailed [facility] social worker to see if she can send me a contact person at the [place] [facility] to follow up on the SNF request.  3:30 pm: Received call from [facility]. Patient has been approved for a 30-day contract with an [facility] facility. They will email details and facility list to this CSW.    Barriers to Discharge: Continued Medical Work up               Expected Discharge Plan and Services                                               Social Drivers of Health (SDOH) Interventions SDOH Screenings   Food Insecurity: No Food Insecurity (09/30/2023)  Housing: Low Risk  (09/30/2023)  Transportation Needs: No Transportation Needs (09/30/2023)  Utilities: Not At Risk (09/30/2023)  Depression (PHQ2-9): Medium Risk (08/23/2019)  Social Connections: Moderately Isolated (09/30/2023)  Tobacco Use: Low Risk  (09/29/2023)    Readmission Risk Interventions     No data to display

## 2023-10-11 DIAGNOSIS — A419 Sepsis, unspecified organism: Secondary | ICD-10-CM | POA: Diagnosis not present

## 2023-10-11 DIAGNOSIS — J181 Lobar pneumonia, unspecified organism: Secondary | ICD-10-CM | POA: Diagnosis not present

## 2023-10-11 DIAGNOSIS — J9601 Acute respiratory failure with hypoxia: Secondary | ICD-10-CM | POA: Diagnosis not present

## 2023-10-11 DIAGNOSIS — R410 Disorientation, unspecified: Secondary | ICD-10-CM | POA: Diagnosis not present

## 2023-10-11 LAB — GLUCOSE, CAPILLARY
Glucose-Capillary: 119 mg/dL — ABNORMAL HIGH (ref 70–99)
Glucose-Capillary: 121 mg/dL — ABNORMAL HIGH (ref 70–99)
Glucose-Capillary: 129 mg/dL — ABNORMAL HIGH (ref 70–99)
Glucose-Capillary: 161 mg/dL — ABNORMAL HIGH (ref 70–99)
Glucose-Capillary: 215 mg/dL — ABNORMAL HIGH (ref 70–99)
Glucose-Capillary: 246 mg/dL — ABNORMAL HIGH (ref 70–99)

## 2023-10-11 MED ORDER — DM-GUAIFENESIN ER 30-600 MG PO TB12
1.0000 | ORAL_TABLET | Freq: Two times a day (BID) | ORAL | Status: DC
  Administered 2023-10-11 – 2023-10-14 (×7): 1 via ORAL
  Filled 2023-10-11 (×7): qty 1

## 2023-10-11 NOTE — Progress Notes (Signed)
 Mobility Specialist Progress Note:    10/11/23 0926  Mobility  Activity Ambulated with assistance;Pivoted/transferred from bed to chair  Level of Assistance Minimal assist, patient does 75% or more  Assistive Device Front wheel walker  Distance Ambulated (ft) 130 ft  Range of Motion/Exercises Active;All extremities  Activity Response Tolerated well  Mobility visit 1 Mobility  Mobility Specialist Start Time (ACUTE ONLY) 0908  Mobility Specialist Stop Time (ACUTE ONLY) 0926  Mobility Specialist Time Calculation (min) (ACUTE ONLY) 18 min   Pt agreeable to mobility, required MinA to stand and ambulate with RW. Tolerated well, asx throughout. Left pt in chair, all needs met.  Sherrilee Ditty Mobility Specialist Please contact via Special educational needs teacher or  Rehab office at [phone]

## 2023-10-11 NOTE — Plan of Care (Signed)

## 2023-10-11 NOTE — TOC Progression Note (Signed)
 Transition of Care [facility]) - Progression Note    Patient Details  Name: Cristian Martin MRN: [id number] Date of Birth: Sep 14, 1945  [facility]) CM/SW Contact  [place] L [place], [place] Phone Number: 10/11/2023, 2:38 PM  Clinical Narrative:     CSW met with patient. No family at bedside. CSW made two attempts to reach family via phone to no avail. CSW provided patient with a list of [facility] approved facilities. CSW asked patient to review options with family and provide a choice so that shara can be initiated by the [facility].   CSW will follow-up with patient tomorrow. CSW advised that the choice needs to be within the 50 mile radius.     Barriers to Discharge: Continued Medical Work up               Expected Discharge Plan and Services                                               Social Drivers of Health (SDOH) Interventions SDOH Screenings   Food Insecurity: No Food Insecurity (09/30/2023)  Housing: Low Risk  (09/30/2023)  Transportation Needs: No Transportation Needs (09/30/2023)  Utilities: Not At Risk (09/30/2023)  Depression (PHQ2-9): Medium Risk (08/23/2019)  Social Connections: Moderately Isolated (09/30/2023)  Tobacco Use: Low Risk  (09/29/2023)    Readmission Risk Interventions     No data to display

## 2023-10-11 NOTE — Progress Notes (Signed)
 Progress Note   Patient: Cristian Martin FMW:[id number] DOB: 05/25/1945 DOA: 09/29/2023     12 DOS: the patient was seen and examined on 10/11/2023   Brief hospital course: 78 y.o. male with medical history significant for CAD s/p CABG HFpEF, HLD, GERD, HTN, DM on insulin , anxiety who presented to ED after altered mental status, fever, nausea and vomiting.  EMS was called out by the patient's wife who last saw the patient normal last night.  This morning she found the patient to be very somnolent, less responsive, covered with vomitus which presumably happened overnight.  EMS states roommate air saturation was 82% with no baseline oxygen requirement, placed the patient on nonrebreather found to have a 102.6 F temperature, transported to emergency room for further evaluation. Patient was seen and examined at bedside in the emergency room.  Patient was alert awake and oriented.  He stated that he was okay while he went to the bed last night.  But he was not able to tell me what happened in the night and this morning.  He was able to tell me he is in the hospital and he was able to tell me the date, year and the month.   ED Course: Upon arrival to the ED, patient is found to be alert awake, was able to tell his name but unable to tell where is he and what is the date.  Chest x-ray showed right midlung opacity concerning for pneumonia, leukopenia at 2.9, hypoxemia with normal lactic acid at 1.6.  Respiratory panel were negative patient required humidified high flow oxygen via nasal cannula at 15 L in upper 90s saturation.  Patient was given vancomycin  metronidazole  and cefepime  and 1 L of lactated Ringer 's bolus and hospitalist service was consulted for evaluation for admission for possible pneumonia and acute hypoxic respiratory failure  8/29.  Called this morning for hypoglycemia.  Patient was mentating and following commands.  Sugar confirmed low on lab draw and was replaced.  Patient received Lantus  20  units last night.  Normally takes 70/30 insulin  60 units twice a day.  Patient admitted with acute respiratory failure on 15 L high flow nasal cannula secondary to pneumonia right side.  8/30.  Patient had a rough night did not sleep pulled out numerous IVs and wanting to go home.  Called to see patient first.  Patient delirious.  Family at bedside.  9/1.  Patient still on 10 L high flow nasal cannula.  Feeling better.  Sitting up eating lunch.  Pulmonary started steroids.  9/3: Vital stable on 5 L of oxygen, CBG elevated-increasing Lantus  to 28 and meal coverage to 5 as patient is on steroid now  9/4: Vital stable now on 2 L of oxygen.  CBG remained elevated so increasing Lantus  to 30 and mealtime coverage to 7.  Started tapering steroid.  TOC is working on placement.  9/5: Remained hemodynamically stable, awaiting VA approval for SNF.  CBG now less than 100 as we started tapering steroids so decreasing Lantus  to 25 units.  9/6: Hemodynamically stable, now on room air.  CBG now within goal.  Awaiting SNF placement  9/7: Blood pressure now started trending up, restarting home losartan  at a lower dose of 25 mg daily.  Still pending placement  9/8: Remained hemodynamically stable, awaiting VA approved facility for disposition.  9/9: Remains stable and still awaiting for VA approved SNF  Assessment and Plan: * Severe sepsis (HCC) Present on admission with fever, tachycardia, tachypnea, hypoxia (acute respiratory  failure), leukopenia.  Patient has lobar pneumonia on right side.  Since MRSA PCR negative vancomycin  was discontinued.  Patient on Rocephin  and completed Zithromax .  Pulmonary started steroids. - Started tapering steroid now  Acute delirium Continue Seroquel  at night and as needed Haldol .    Lobar pneumonia (HCC) Right-sided pneumonia.  Continue Rocephin  and completed Zithromax .  Since MRSA PCR negative vancomycin  discontinued.  Nebulizers and incentive spirometry.  Acute  hypoxemic respiratory failure (HCC) Initial pulse ox 88% on nonrebreather mask.  Patient on 15 L high flow nasal cannula for 2 days.   Pulmonary started him on steroids with improvement in oxygen requirement, now on room air - Continue with steroid taper  Uncontrolled type 2 diabetes mellitus with hyperglycemia, with long-term current use of insulin  (HCC) CBG within goal - Continue the dose of Lantus  to 25 unit - Continue mealtime coverage to 7 unit -Continue with current SSI  Hypoglycemia level 3 Sugar 38 on the morning of 8/29.   Now patient has hyperglycemia.  AKI (acute kidney injury) (HCC) Creatinine improved 1.05.  S/p IV fluid -Monitor renal function  Depression Psychiatry restarted Cymbalta .  No safety issues noted by psychiatry.  Hypomagnesemia Replaced  Hypokalemia Resolved after repletion  Malignant neoplasm of prostate (HCC) Follow-up as outpatient  Hyperlipidemia, mixed On Crestor   GERD (gastroesophageal reflux disease) On Protonix   Benign essential hypertension Blood pressure within goal today - Continue Coreg  - Home dose of losartan  losartan  was restarted at a lower dose of 25 mg daily-Will continue current dose   Subjective: Patient was seen and examined today.  Having some dry cough.  No other concerns.  Physical Exam: Vitals:   10/10/23 2030 10/11/23 0411 10/11/23 0835 10/11/23 1100  BP: 126/70 102/60 114/63   Pulse: 76 76 73 89  Resp: 20 20 17    Temp:  (!) 97.3 F (36.3 C) 97.8 F (36.6 C)   TempSrc:   Oral   SpO2: 93% 95% 93% 100%  Weight:      Height:       General.  Frail elderly man, in no acute distress. Pulmonary.  Lungs clear bilaterally, normal respiratory effort. CV.  Regular rate and rhythm, no JVD, rub or murmur. Abdomen.  Soft, nontender, nondistended, BS positive. CNS.  Alert and oriented .  No focal neurologic deficit. Extremities.  No edema,  pulses intact and symmetrical. Psychiatry.  Judgment and insight appears  normal.     Data Reviewed: Prior data reviewed  Family Communication: Discussed with patient  Disposition: Status is: Inpatient Remains inpatient appropriate because: Severity of illness  Planned Discharge Destination: Rehab  This record has been created using Conservation officer, historic buildings. Errors have been sought and corrected,but may not always be located. Such creation errors do not reflect on the standard of care.   Time spent: 39 minutes  Author: Amaryllis Dare, MD 10/11/2023 3:32 PM  For on call review [email or web address].

## 2023-10-12 DIAGNOSIS — A419 Sepsis, unspecified organism: Secondary | ICD-10-CM | POA: Diagnosis not present

## 2023-10-12 DIAGNOSIS — R6520 Severe sepsis without septic shock: Secondary | ICD-10-CM | POA: Diagnosis not present

## 2023-10-12 LAB — GLUCOSE, CAPILLARY
Glucose-Capillary: 104 mg/dL — ABNORMAL HIGH (ref 70–99)
Glucose-Capillary: 136 mg/dL — ABNORMAL HIGH (ref 70–99)
Glucose-Capillary: 138 mg/dL — ABNORMAL HIGH (ref 70–99)
Glucose-Capillary: 139 mg/dL — ABNORMAL HIGH (ref 70–99)
Glucose-Capillary: 155 mg/dL — ABNORMAL HIGH (ref 70–99)
Glucose-Capillary: 186 mg/dL — ABNORMAL HIGH (ref 70–99)
Glucose-Capillary: 211 mg/dL — ABNORMAL HIGH (ref 70–99)

## 2023-10-12 MED ORDER — MIRABEGRON ER 25 MG PO TB24
25.0000 mg | ORAL_TABLET | Freq: Every day | ORAL | Status: DC
Start: 2023-10-12 — End: 2023-10-12

## 2023-10-12 MED ORDER — INSULIN GLARGINE 100 UNIT/ML ~~LOC~~ SOLN
20.0000 [IU] | Freq: Every day | SUBCUTANEOUS | Status: DC
  Administered 2023-10-12 – 2023-10-13 (×2): 20 [IU] via SUBCUTANEOUS
  Filled 2023-10-12 (×3): qty 0.2

## 2023-10-12 MED ORDER — EMPAGLIFLOZIN 10 MG PO TABS: 10.0000 mg | ORAL_TABLET | Freq: Every morning | ORAL | Status: DC

## 2023-10-12 NOTE — Plan of Care (Signed)
  Problem: Education: Goal: Ability to describe self-care measures that may prevent or decrease complications (Diabetes Survival Skills Education) will improve Outcome: Progressing Goal: Individualized Educational Video(s) Outcome: Progressing   Problem: Coping: Goal: Ability to adjust to condition or change in health will improve Outcome: Progressing   Problem: Fluid Volume: Goal: Ability to maintain a balanced intake and output will improve Outcome: Progressing   Problem: Nutritional: Goal: Maintenance of adequate nutrition will improve Outcome: Progressing Goal: Progress toward achieving an optimal weight will improve Outcome: Progressing   Problem: Skin Integrity: Goal: Risk for impaired skin integrity will decrease Outcome: Progressing   Problem: Clinical Measurements: Goal: Ability to maintain clinical measurements within normal limits will improve Outcome: Progressing Goal: Will remain free from infection Outcome: Progressing Goal: Diagnostic test results will improve Outcome: Progressing Goal: Respiratory complications will improve Outcome: Progressing Goal: Cardiovascular complication will be avoided Outcome: Progressing

## 2023-10-12 NOTE — Progress Notes (Signed)
 Mobility Specialist Progress Note:    10/12/23 1416  Mobility  Activity Ambulated with assistance;Pivoted/transferred from chair to bed  Level of Assistance Contact guard assist, steadying assist  Assistive Device Front wheel walker  Distance Ambulated (ft) 120 ft  Range of Motion/Exercises Active;All extremities  Activity Response Tolerated well  Mobility visit 1 Mobility  Mobility Specialist Start Time (ACUTE ONLY) 1400  Mobility Specialist Stop Time (ACUTE ONLY) 1416  Mobility Specialist Time Calculation (min) (ACUTE ONLY) 16 min   CGA with RW to stand and ambulate. Tolerated well, asx throughout. Left pt supine, alarm on and all needs met.  Sherrilee Ditty Mobility Specialist Please contact via Special educational needs teacher or  Rehab office at [phone]

## 2023-10-12 NOTE — TOC Progression Note (Signed)
 Transition of Care [facility]) - Progression Note    Patient Details  Name: Cristian Martin MRN: [id number] Date of Birth: August 16, 1945  Transition of Care [name]) CM/SW Contact  [name], [place] Phone Number: 10/12/2023, 9:59 AM  Clinical Narrative:     CSW contacted spouse regarding choice. CSW advised that [facility] in [place] did not offer a bed. Spouse asked CSW to meet with patient to go over bed offers.   CSW met with patient and advised of bed offers. Patient advised that he will go to [facility] in [place]. There are two facilities but patient advised CSW that it didn't matter which one and he was ok going to either location.   CSW accepted the bed offer and will contact Ms. Batot, [facility].     Barriers to Discharge: Continued Medical Work up               Expected Discharge Plan and Services         Expected Discharge Date: 10/11/23                                     Social Drivers of Health (SDOH) Interventions SDOH Screenings   Food Insecurity: No Food Insecurity (09/30/2023)  Housing: Low Risk  (09/30/2023)  Transportation Needs: No Transportation Needs (09/30/2023)  Utilities: Not At Risk (09/30/2023)  Depression (PHQ2-9): Medium Risk (08/23/2019)  Social Connections: Moderately Isolated (09/30/2023)  Tobacco Use: Low Risk  (09/29/2023)    Readmission Risk Interventions     No data to display

## 2023-10-12 NOTE — Progress Notes (Signed)
 Progress Note   Patient: Cristian Martin FMW:[id number] DOB: Jul 22, 1945 DOA: 09/29/2023     13 DOS: the patient was seen and examined on 10/12/2023   Brief hospital course:  77 y.o. male with medical history significant for CAD s/p CABG HFpEF, HLD, GERD, HTN, DM on insulin , anxiety who presented to ED after altered mental status, fever, nausea and vomiting.  EMS was called out by the patient's wife who last saw the patient normal last night.  This morning she found the patient to be very somnolent, less responsive, covered with vomitus which presumably happened overnight.  EMS states roommate air saturation was 82% with no baseline oxygen requirement, placed the patient on nonrebreather found to have a 102.6 F temperature, transported to emergency room for further evaluation. Patient was seen and examined at bedside in the emergency room.  Patient was alert awake and oriented.  He stated that he was okay while he went to the bed last night.  But he was not able to tell me what happened in the night and this morning.  He was able to tell me he is in the hospital and he was able to tell me the date, year and the month.   ED Course: Upon arrival to the ED, patient is found to be alert awake, was able to tell his name but unable to tell where is he and what is the date.  Chest x-ray showed right midlung opacity concerning for pneumonia, leukopenia at 2.9, hypoxemia with normal lactic acid at 1.6.  Respiratory panel were negative patient required humidified high flow oxygen via nasal cannula at 15 L in upper 90s saturation.  Patient was given vancomycin  metronidazole  and cefepime  and 1 L of lactated Ringer 's bolus and hospitalist service was consulted for evaluation for admission for possible pneumonia and acute hypoxic respiratory failure   8/29.  Called this morning for hypoglycemia.  Patient was mentating and following commands.  Sugar confirmed low on lab draw and was replaced.  Patient received Lantus  20  units last night.  Normally takes 70/30 insulin  60 units twice a day.  Patient admitted with acute respiratory failure on 15 L high flow nasal cannula secondary to pneumonia right side.   8/30.  Patient had a rough night did not sleep pulled out numerous IVs and wanting to go home.  Called to see patient first.  Patient delirious.  Family at bedside.   9/1.  Patient still on 10 L high flow nasal cannula.  Feeling better.  Sitting up eating lunch.  Pulmonary started steroids.   9/3: Vital stable on 5 L of oxygen, CBG elevated-increasing Lantus  to 28 and meal coverage to 5 as patient is on steroid now   9/4: Vital stable now on 2 L of oxygen.  CBG remained elevated so increasing Lantus  to 30 and mealtime coverage to 7.  Started tapering steroid.  TOC is working on placement.   9/5: Remained hemodynamically stable, awaiting [facility] approval for SNF.  CBG now less than 100 as we started tapering steroids so decreasing Lantus  to 25 units.   9/6: Hemodynamically stable, now on room air.  CBG now within goal.  Awaiting SNF placement   9/7: Blood pressure now started trending up, restarting home losartan  at a lower dose of 25 mg daily.  Still pending placement   9/8: Remained hemodynamically stable, awaiting [facility] approved facility for disposition.   9/9 onward: Remains stable and still awaiting for VA approved SNF  Assessment and Plan: * Severe  sepsis [facility]) Present on admission with fever, tachycardia, tachypnea, hypoxia (acute respiratory failure), leukopenia.  Patient has lobar pneumonia on right side.  Since MRSA PCR negative vancomycin  was discontinued.  Patient on Rocephin  and completed Zithromax .  Pulmonary started steroids which we are now weaning  Acute delirium resolved  Lobar pneumonia (HCC) Resolved s/p ceftriaxone /azithromycin   Acute hypoxemic respiratory failure (HCC) Initial pulse ox 88% on nonrebreather mask.  Patient on 15 L high flow nasal cannula for 2 days.   Now weaned to room  air Continue steroid taper  Uncontrolled type 2 diabetes mellitus with hyperglycemia, with long-term current use of insulin  (HCC) CBG within goal - decrease lantus  - Continue mealtime coverage to 7 unit -Continue with current SSI  AKI (acute kidney injury) (HCC) resolved  Depression Psychiatry restarted Cymbalta .  No safety issues noted by psychiatry.  Hypomagnesemia Replaced  Hypokalemia Resolved after repletion  Malignant neoplasm of prostate (HCC) Follow-up as outpatient  Hyperlipidemia, mixed On Crestor   GERD (gastroesophageal reflux disease) On Protonix   Benign essential hypertension Blood pressure within goal today - Continue Coreg  - Home dose of losartan  losartan  was restarted at a lower dose of 25 mg daily-Will continue current dose   Subjective: reports breathing normally, bm today, tolerating diet  Physical Exam: Vitals:   10/11/23 2022 10/12/23 0509 10/12/23 0752 10/12/23 1557  BP: 119/66 111/64 125/65 116/63  Pulse: 81 74 74 79  Resp: 18 18 15 18   Temp: 97.6 F (36.4 C) 98 F (36.7 C) 98.6 F (37 C)   TempSrc:      SpO2: 99% 93% 97% 95%  Weight:      Height:       General.  Frail elderly man, in no acute distress. Pulmonary.  Lungs clear bilaterally, normal respiratory effort. CV.  Regular rate and rhythm, no JVD, rub or murmur. No edema Abdomen.  Soft, nontender, nondistended  CNS.  Alert and oriented .  No focal neurologic deficit. Extremities.  No edema,  pulses intact and symmetrical. Psychiatry.  Judgment and insight appears normal.     Data Reviewed: Prior data reviewed  Family Communication: Discussed with patient  Disposition: Status is: Inpatient Remains inpatient appropriate because: pending rehab placement  Planned Discharge Destination: Rehab    Author: Devaughn KATHEE Ban, MD 10/12/2023 5:38 PM  For on call review [email or web address].

## 2023-10-12 NOTE — Progress Notes (Signed)
 Occupational Therapy Treatment Patient Details Name: Cristian Martin MRN: [id number] DOB: 02/03/1945 Today's Date: 10/12/2023   History of present illness Cristian Martin is a pleasant 78 y.o. male with medical history significant for CAD s/p CABG HFpEF, HLD, GERD, HTN, DM on insulin , anxiety who presented to ED after altered mental status, fever, nausea and vomiting. MD dx include: Acute hypoxemic respiratory failure likely due to pneumonia, congestive heart failure, HTN, diabetes, hypokalemia, and CAD.   OT comments  Upon entering the room, pt supine in bed and agreeable to OT intervention. Pt performs bed mobility with CGA. Pt needing min guard to stand with RW from standard bed height. Pt stands at sink to wash face and brush teeth with min guard for standing balance ~ 5 minutes. Pt then ambulates 100' in hallway with RW and min guard. Pt fatigued quickly and needs increasing cues for forward head to and step within RW for safety. Pt returning to room and bed at end of session. Call bell and all needed items within reach upon exiting the room.       If plan is discharge home, recommend the following:  A lot of help with walking and/or transfers;A lot of help with bathing/dressing/bathroom   Equipment Recommendations  BSC/3in1       Precautions / Restrictions Precautions Precautions: Fall Recall of Precautions/Restrictions: Impaired       Mobility Bed Mobility Overal bed mobility: Needs Assistance Bed Mobility: Supine to Sit, Sit to Supine     Supine to sit: Supervision Sit to supine: Supervision        Transfers Overall transfer level: Needs assistance Equipment used: Rolling walker (2 wheels) Transfers: Sit to/from Stand Sit to Stand: Contact guard assist                 Balance Overall balance assessment: Needs assistance Sitting-balance support: No upper extremity supported, Feet supported       Standing balance support: Bilateral upper extremity supported,  Reliant on assistive device for balance, During functional activity Standing balance-Leahy Scale: Fair                             ADL either performed or assessed with clinical judgement   ADL Overall ADL's : Needs assistance/impaired     Grooming: Oral care;Standing;Contact guard assist                                      Extremity/Trunk Assessment Upper Extremity Assessment Upper Extremity Assessment: Overall WFL for tasks assessed            Vision Patient Visual Report: No change from baseline               Cognition Arousal: Alert Behavior During Therapy: WFL for tasks assessed/performed                                 Following commands: Intact                      Pertinent Vitals/ Pain       Pain Assessment Pain Assessment: No/denies pain         Frequency  Min 2X/week        Progress Toward Goals  OT Goals(current goals can now be found  in the care plan section)  Progress towards OT goals: Progressing toward goals      AM-PAC OT 6 Clicks Daily Activity     Outcome Measure   Help from another person eating meals?: None Help from another person taking care of personal grooming?: A Little Help from another person toileting, which includes using toliet, bedpan, or urinal?: A Little Help from another person bathing (including washing, rinsing, drying)?: A Little Help from another person to put on and taking off regular upper body clothing?: A Little Help from another person to put on and taking off regular lower body clothing?: A Lot 6 Click Score: 18    End of Session Equipment Utilized During Treatment: Rolling walker (2 wheels)  OT Visit Diagnosis: Other abnormalities of gait and mobility (R26.89);Muscle weakness (generalized) (M62.81)   Activity Tolerance Patient tolerated treatment well   Patient Left with call bell/phone within reach;in bed;with bed alarm set   Nurse Communication  Mobility status        Time: 8564-8547 OT Time Calculation (min): 17 min  Charges: OT General Charges $OT Visit: 1 Visit OT Treatments $Self Care/Home Management : 8-22 mins  Izetta Claude, MS, OTR/L , CBIS ascom [phone]  10/12/23, 2:55 PM

## 2023-10-13 DIAGNOSIS — A419 Sepsis, unspecified organism: Secondary | ICD-10-CM | POA: Diagnosis not present

## 2023-10-13 DIAGNOSIS — R6520 Severe sepsis without septic shock: Secondary | ICD-10-CM | POA: Diagnosis not present

## 2023-10-13 LAB — BASIC METABOLIC PANEL WITH GFR
Anion gap: 14 (ref 5–15)
BUN: 25 mg/dL — ABNORMAL HIGH (ref 8–23)
CO2: 23 mmol/L (ref 22–32)
Calcium: 9 mg/dL (ref 8.9–10.3)
Chloride: 103 mmol/L (ref 98–111)
Creatinine, Ser: 1.3 mg/dL — ABNORMAL HIGH (ref 0.61–1.24)
GFR, Estimated: 56 mL/min — ABNORMAL LOW (ref >60)
Glucose, Bld: 136 mg/dL — ABNORMAL HIGH (ref 70–99)
Potassium: 4.1 mmol/L (ref 3.5–5.1)
Sodium: 140 mmol/L (ref 135–145)

## 2023-10-13 LAB — GLUCOSE, CAPILLARY
Glucose-Capillary: 122 mg/dL — ABNORMAL HIGH (ref 70–99)
Glucose-Capillary: 184 mg/dL — ABNORMAL HIGH (ref 70–99)
Glucose-Capillary: 203 mg/dL — ABNORMAL HIGH (ref 70–99)
Glucose-Capillary: 197 mg/dL — ABNORMAL HIGH (ref 70–99)

## 2023-10-13 NOTE — Progress Notes (Signed)
 Progress Note   Patient: Cristian Martin FMW:[id number] DOB: July 23, 1945 DOA: 09/29/2023     14 DOS: the patient was seen and examined on 10/13/2023   Brief hospital course:  78 y.o. male with medical history significant for CAD s/p CABG HFpEF, HLD, GERD, HTN, DM on insulin , anxiety who presented to ED after altered mental status, fever, nausea and vomiting.  EMS was called out by the patient's wife who last saw the patient normal last night.  This morning she found the patient to be very somnolent, less responsive, covered with vomitus which presumably happened overnight.  EMS states roommate air saturation was 82% with no baseline oxygen requirement, placed the patient on nonrebreather found to have a 102.6 F temperature, transported to emergency room for further evaluation. Patient was seen and examined at bedside in the emergency room.  Patient was alert awake and oriented.  He stated that he was okay while he went to the bed last night.  But he was not able to tell me what happened in the night and this morning.  He was able to tell me he is in the hospital and he was able to tell me the date, year and the month.   ED Course: Upon arrival to the ED, patient is found to be alert awake, was able to tell his name but unable to tell where is he and what is the date.  Chest x-ray showed right midlung opacity concerning for pneumonia, leukopenia at 2.9, hypoxemia with normal lactic acid at 1.6.  Respiratory panel were negative patient required humidified high flow oxygen via nasal cannula at 15 L in upper 90s saturation.  Patient was given vancomycin  metronidazole  and cefepime  and 1 L of lactated Ringer 's bolus and hospitalist service was consulted for evaluation for admission for possible pneumonia and acute hypoxic respiratory failure   8/29.  Called this morning for hypoglycemia.  Patient was mentating and following commands.  Sugar confirmed low on lab draw and was replaced.  Patient received Lantus  20  units last night.  Normally takes 70/30 insulin  60 units twice a day.  Patient admitted with acute respiratory failure on 15 L high flow nasal cannula secondary to pneumonia right side.   8/30.  Patient had a rough night did not sleep pulled out numerous IVs and wanting to go home.  Called to see patient first.  Patient delirious.  Family at bedside.   9/1.  Patient still on 10 L high flow nasal cannula.  Feeling better.  Sitting up eating lunch.  Pulmonary started steroids.   9/3: Vital stable on 5 L of oxygen, CBG elevated-increasing Lantus  to 28 and meal coverage to 5 as patient is on steroid now   9/4: Vital stable now on 2 L of oxygen.  CBG remained elevated so increasing Lantus  to 30 and mealtime coverage to 7.  Started tapering steroid.  TOC is working on placement.   9/5: Remained hemodynamically stable, awaiting [facility] approval for SNF.  CBG now less than 100 as we started tapering steroids so decreasing Lantus  to 25 units.   9/6: Hemodynamically stable, now on room air.  CBG now within goal.  Awaiting SNF placement   9/7: Blood pressure now started trending up, restarting home losartan  at a lower dose of 25 mg daily.  Still pending placement   9/8: Remained hemodynamically stable, awaiting [facility] approved facility for disposition.   9/9 onward: Remains stable and still awaiting for VA approved SNF  Assessment and Plan: * Severe  sepsis [facility]) Present on admission with fever, tachycardia, tachypnea, hypoxia (acute respiratory failure), leukopenia.  Patient has lobar pneumonia on right side.  Since MRSA PCR negative vancomycin  was discontinued.  Patient on Rocephin  and completed Zithromax .  Pulmonary started steroids which we are now weaning  Acute delirium resolved  Lobar pneumonia (HCC) Resolved s/p ceftriaxone /azithromycin   Acute hypoxemic respiratory failure (HCC) Initial pulse ox 88% on nonrebreather mask.  Patient on 15 L high flow nasal cannula for 2 days.   Now weaned to room  air Continue steroid taper  Uncontrolled type 2 diabetes mellitus with hyperglycemia, with long-term current use of insulin  (HCC) CBG within goal - continue lantus  - Continue mealtime coverage to 7 unit -Continue with current SSI  AKI (acute kidney injury) (HCC) resolved  Depression Psychiatry restarted Cymbalta .  No safety issues noted by psychiatry.  Hypomagnesemia Replaced  Hypokalemia Resolved after repletion  Malignant neoplasm of prostate (HCC) Follow-up as outpatient  Hyperlipidemia, mixed On Crestor   GERD (gastroesophageal reflux disease) On Protonix   Benign essential hypertension Blood pressure within goal today - Continue Coreg  - Home dose of losartan  losartan  was restarted at a lower dose of 25 mg daily -Will continue current dose   Subjective: reports breathing normally, tolerating diet  Physical Exam: Vitals:   10/13/23 0431 10/13/23 0739 10/13/23 1536 10/13/23 1537  BP: 98/79 (!) 140/67 119/68 119/68  Pulse: 76 72 80 81  Resp: 18 18 15 15   Temp: 97.7 F (36.5 C) 97.7 F (36.5 C) 97.9 F (36.6 C) 97.9 F (36.6 C)  TempSrc:  Oral Oral   SpO2: 97% 98% 98% 98%  Weight:      Height:       General.  Frail elderly man, in no acute distress. Pulmonary.  Lungs clear bilaterally, normal respiratory effort. CV.  Regular rate and rhythm, no JVD, rub or murmur. No edema Abdomen.  Soft, nontender, nondistended  CNS.  Alert and oriented .  No focal neurologic deficit. Extremities.  No edema,  pulses intact and symmetrical. Psychiatry.  Judgment and insight appears normal.     Data Reviewed: Prior data reviewed  Family Communication: Discussed with patient  Disposition: Status is: Inpatient Remains inpatient appropriate because: pending rehab placement. PT says no longer needs it. Patient is on the fence about this will discuss w/ family  Planned Discharge Destination: Rehab vs home with home health    Author: Devaughn KATHEE Ban, MD 10/13/2023 5:10  PM  For on call review [email or web address]

## 2023-10-13 NOTE — Progress Notes (Signed)
 Physical Therapy Treatment Patient Details Name: Cristian Martin MRN: [id number] DOB: Feb 22, 1945 Today's Date: 10/13/2023   History of Present Illness Cristian Martin is a pleasant 78 y.o. male with medical history significant for CAD s/p CABG HFpEF, HLD, GERD, HTN, DM on insulin , anxiety who presented to ED after altered mental status, fever, nausea and vomiting. MD dx include: Acute hypoxemic respiratory failure likely due to pneumonia, congestive heart failure, HTN, diabetes, hypokalemia, and CAD.    PT Comments  Pt able to tolerate ambulation for 167ft with RW for CGA for safety. Pt demonstrates forward flexed posture with fatigue, however is able to correct with verbal cuing. Pt demonstrates improved activity tolerance/strength/balance throughout his stay, now not requiring as much physical assistance as needed previously. Pt demonstrates improved functional mobility and acitvity tolerance though would still benefit from skilled physical therapy. Pt does not require use of O2 and is back to baseline. Pt educated about benefits of HH PT vs. SNF. Discharge disposition updated and care team notified.    If plan is discharge home, recommend the following: A little help with bathing/dressing/bathroom;Assistance with cooking/housework;Help with stairs or ramp for entrance   Can travel by private vehicle     Yes  Equipment Recommendations  None recommended by PT    Recommendations for Other Services       Precautions / Restrictions Precautions Precautions: Fall Recall of Precautions/Restrictions: Intact Restrictions Weight Bearing Restrictions Per Provider Order: No     Mobility  Bed Mobility Overal bed mobility: Needs Assistance Bed Mobility: Supine to Sit     Supine to sit: Modified independent (Device/Increase time)     General bed mobility comments: increased time to go from supine>sittting at EOB    Transfers Overall transfer level: Needs assistance Equipment used: Rolling  walker (2 wheels) Transfers: Sit to/from Stand Sit to Stand: Contact guard assist           General transfer comment: Pt able to STS from EOB. Block feet d/t socks sliding despite grip on sock. Pt demos posterior lean on EOB when standing    Ambulation/Gait Ambulation/Gait assistance: Contact guard assist Gait Distance (Feet): 120 Feet Assistive device: Rolling walker (2 wheels) Gait Pattern/deviations: Trunk flexed Gait velocity: decreased     General Gait Details: Pt required verbal cuing for RW management. No LOB throughout ambulation. No SOB with exertion   Stairs             Wheelchair Mobility     Tilt Bed    Modified Rankin (Stroke Patients Only)       Balance Overall balance assessment: Needs assistance Sitting-balance support: No upper extremity supported, Feet supported Sitting balance-Leahy Scale: Good Sitting balance - Comments: steady reaching within BOS   Standing balance support: Bilateral upper extremity supported, Reliant on assistive device for balance, During functional activity Standing balance-Leahy Scale: Fair Standing balance comment: Reliant on RW for balance. Demonstrates forward flexed posture, but is able to correct with verbal cues.                            Communication Communication Communication: Impaired Factors Affecting Communication: Reduced clarity of speech  Cognition Arousal: Alert Behavior During Therapy: WFL for tasks assessed/performed   PT - Cognitive impairments: No apparent impairments                       PT - Cognition Comments: Pt is pleasant and agreeable to  PT session. Following commands: Intact      Cueing Cueing Techniques: Verbal cues, Visual cues  Exercises Other Exercises Other Exercises: Edu on HH PT vs SNF.    General Comments        Pertinent Vitals/Pain Pain Assessment Pain Assessment: No/denies pain    Home Living                          Prior  Function            PT Goals (current goals can now be found in the care plan section) Acute Rehab PT Goals Patient Stated Goal: to get stronger PT Goal Formulation: With patient Time For Goal Achievement: 10/14/23 Potential to Achieve Goals: Good Progress towards PT goals: Progressing toward goals    Frequency    Min 2X/week      PT Plan      Co-evaluation              AM-PAC PT 6 Clicks Mobility   Outcome Measure  Help needed turning from your back to your side while in a flat bed without using bedrails?: A Little Help needed moving from lying on your back to sitting on the side of a flat bed without using bedrails?: A Little Help needed moving to and from a bed to a chair (including a wheelchair)?: A Little Help needed standing up from a chair using your arms (e.g., wheelchair or bedside chair)?: A Little Help needed to walk in hospital room?: A Little Help needed climbing 3-5 steps with a railing? : A Lot 6 Click Score: 17    End of Session Equipment Utilized During Treatment: Gait belt Activity Tolerance: Patient tolerated treatment well Patient left: in chair;with call bell/phone within reach;with chair alarm set Nurse Communication: Mobility status PT Visit Diagnosis: Unsteadiness on feet (R26.81);Muscle weakness (generalized) (M62.81);History of falling (Z91.81);Difficulty in walking, not elsewhere classified (R26.2)     Time: 8867-8851 PT Time Calculation (min) (ACUTE ONLY): 16 min  Charges:                            Shelbi Vaccaro, SPT    Tzion Wedel 10/13/2023, 1:18 PM

## 2023-10-14 DIAGNOSIS — R6520 Severe sepsis without septic shock: Secondary | ICD-10-CM | POA: Diagnosis not present

## 2023-10-14 DIAGNOSIS — A419 Sepsis, unspecified organism: Secondary | ICD-10-CM | POA: Diagnosis not present

## 2023-10-14 LAB — GLUCOSE, CAPILLARY
Glucose-Capillary: 124 mg/dL — ABNORMAL HIGH (ref 70–99)
Glucose-Capillary: 215 mg/dL — ABNORMAL HIGH (ref 70–99)

## 2023-10-14 MED ORDER — CARVEDILOL 25 MG PO TABS: 12.5000 mg | ORAL_TABLET | Freq: Two times a day (BID) | ORAL | Status: AC

## 2023-10-14 NOTE — TOC Transition Note (Signed)
 Transition of Care [facility]) - Discharge Note   Patient Details  Name: Cristian Martin MRN: [id number] Date of Birth: May 21, 1945  Transition of Care [facility]) CM/SW Contact:  Racheal LITTIE Schimke, RN Phone Number: 10/14/2023, 3:00 PM   Clinical Narrative: Spoke with patient and wife at bedside about discharge with [facility] and services arranged, RN/PT/OT/Aide. HH to start on Monday or Tuesday, patient and wife confident about care until then. MD and RN aware of HH start time. TOC needs met.      Final next level of care: Home w Home Health Services Barriers to Discharge: Barriers Resolved   Patient Goals and CMS Choice Patient states their goals for this hospitalization and ongoing recovery are:: Home with [facility]   Choice offered to / list presented to : Patient      Discharge Placement                  Name of family member notified: Lamin Chandley, wife Patient and family notified of of transfer: 10/14/23  Discharge Plan and Services Additional resources added to the After Visit Summary for                  DME Arranged: N/A DME Agency: NA       HH Arranged: PT, OT, RN, Nurse's Aide HH Agency: [facility] Date [facility] Agency Contacted: 10/14/23   Representative spoke with at [facility] Agency: [place]  Social Drivers of Health (SDOH) Interventions SDOH Screenings   Food Insecurity: No Food Insecurity (09/30/2023)  Housing: Low Risk  (09/30/2023)  Transportation Needs: No Transportation Needs (09/30/2023)  Utilities: Not At Risk (09/30/2023)  Depression (PHQ2-9): Medium Risk (08/23/2019)  Social Connections: Moderately Isolated (09/30/2023)  Tobacco Use: Low Risk  (09/29/2023)     Readmission Risk Interventions     No data to display

## 2023-10-14 NOTE — Progress Notes (Signed)
 Occupational Therapy Treatment Patient Details Name: Cristian Martin MRN: [id number] DOB: 18-Dec-1945 Today's Date: 10/14/2023   History of present illness Cristian Martin is a pleasant 78 y.o. male with medical history significant for CAD s/p CABG HFpEF, HLD, GERD, HTN, DM on insulin , anxiety who presented to ED after altered mental status, fever, nausea and vomiting. MD dx include: Acute hypoxemic respiratory failure likely due to PNA, congestive heart failure, HTN, diabetes, hypokalemia, and CAD.   OT comments  Pt seen for OT tx. Pt ambulated ~100' with RW in the hall while pt educated in ECS including activity pacing, work simplification, falls prevention, AE/DME use, and use of PRE scale to monitor exertion. Pt verbalized understanding. Progressing well.       If plan is discharge home, recommend the following:  A little help with walking and/or transfers;A little help with bathing/dressing/bathroom;Assistance with cooking/housework;Assist for transportation;Help with stairs or ramp for entrance   Equipment Recommendations  BSC/3in1    Recommendations for Other Services      Precautions / Restrictions Precautions Precautions: Fall Recall of Precautions/Restrictions: Intact Restrictions Weight Bearing Restrictions Per Provider Order: No       Mobility Bed Mobility               General bed mobility comments: NT, in recliner pre and post session    Transfers Overall transfer level: Needs assistance Equipment used: Rolling walker (2 wheels) Transfers: Sit to/from Stand Sit to Stand: Supervision                 Balance Overall balance assessment: Needs assistance Sitting-balance support: No upper extremity supported, Feet supported Sitting balance-Leahy Scale: Good     Standing balance support: Bilateral upper extremity supported, Reliant on assistive device for balance, During functional activity Standing balance-Leahy Scale: Fair                              ADL either performed or assessed with clinical judgement   ADL                                              Extremity/Trunk Assessment              Vision       Perception     Praxis     Communication     Cognition Arousal: Alert Behavior During Therapy: WFL for tasks assessed/performed Cognition: No apparent impairments                                        Cueing      Exercises Other Exercises Other Exercises: Pt ambulated ~100' with RW in the hall while pt educated in ECS including activity pacing, work simplification, falls prevention, AE/DME use, and use of PRE scale to monitor exertion    Shoulder Instructions       General Comments      Pertinent Vitals/ Pain       Pain Assessment Pain Assessment: No/denies pain  Home Living  Prior Functioning/Environment              Frequency  Min 2X/week        Progress Toward Goals  OT Goals(current goals can now be found in the care plan section)  Progress towards OT goals: Progressing toward goals  Acute Rehab OT Goals Patient Stated Goal: to go home OT Goal Formulation: With patient Time For Goal Achievement: 10/14/23 Potential to Achieve Goals: Good  Plan      Co-evaluation                 AM-PAC OT 6 Clicks Daily Activity     Outcome Measure   Help from another person eating meals?: None Help from another person taking care of personal grooming?: None Help from another person toileting, which includes using toliet, bedpan, or urinal?: None Help from another person bathing (including washing, rinsing, drying)?: A Little Help from another person to put on and taking off regular upper body clothing?: None Help from another person to put on and taking off regular lower body clothing?: A Little 6 Click Score: 22    End of Session Equipment Utilized During Treatment: Gait  belt;Rolling walker (2 wheels)  OT Visit Diagnosis: Other abnormalities of gait and mobility (R26.89);Muscle weakness (generalized) (M62.81)   Activity Tolerance Patient tolerated treatment well   Patient Left in chair;with call bell/phone within reach;with chair alarm set;with family/visitor present   Nurse Communication          Time: 1131-1145 OT Time Calculation (min): 14 min  Charges: OT General Charges $OT Visit: 1 Visit OT Treatments $Self Care/Home Management : 8-22 mins  Warren SAUNDERS., MPH, MS, OTR/L ascom [phone] 10/14/23, 12:29 PM

## 2023-10-14 NOTE — Discharge Summary (Signed)
 Cristian Martin FMW:[id number] DOB: 1946/01/24 DOA: 09/29/2023  PCP: [facility]  Admit date: 09/29/2023 Discharge date: 10/14/2023  Time spent: 35 minutes  Recommendations for Outpatient Follow-up:  Pcp f/u 1-2 weeks     Discharge Diagnoses:  Principal Problem:   Severe sepsis [facility]) Active Problems:   Acute delirium   Lobar pneumonia (HCC)   Acute hypoxemic respiratory failure (HCC)   Uncontrolled type 2 diabetes mellitus with hyperglycemia, with long-term current use of insulin  (HCC)   Hypoglycemia level 3   Benign essential hypertension   GERD (gastroesophageal reflux disease)   Hyperlipidemia, mixed   Malignant neoplasm of prostate (HCC)   S/P CABG x 3   Hypokalemia   Hypomagnesemia   Depression   AKI (acute kidney injury) (HCC)   Discharge Condition: improved  Diet recommendation: heart healthy  Filed Weights   09/29/23 0912 09/30/23 0500  Weight: 88.5 kg 82.4 kg    History of present illness:  From admission h and p Cristian Martin is a pleasant 78 y.o. male with medical history significant for CAD s/p CABG HFpEF, HLD, GERD, HTN, DM on insulin , anxiety who presented to ED after altered mental status, fever, nausea and vomiting.  EMS was called out by the patient's wife who last saw the patient normal last night.  This morning she found the patient to be very somnolent, less responsive, covered with vomitus which presumably happened overnight.  EMS states roommate air saturation was 82% with no baseline oxygen requirement, placed the patient on nonrebreather found to have a 102.6 F temperature, transported to emergency room for further evaluation. Patient was seen and examined at bedside in the emergency room.  Patient was alert awake and oriented.  He stated that he was okay while he went to the bed last night.  But he was not able to tell me what happened in the night and this morning.  He was able to tell me he is in the hospital and he was able to tell me the  date, year and the month.  Hospital Course:   Severe sepsis [facility]) Present on admission with fever, tachycardia, tachypnea, hypoxia (acute respiratory failure), leukopenia.  Patient has lobar pneumonia on right side.  Since MRSA PCR negative vancomycin  was discontinued.  Patient on Rocephin  and completed Zithromax .  Pulmonary started steroids which we are now weaned. Sepsis resolved  Debility Initial PT recs for SNF, extended [facility] stay [facility], patient has now progressed and PT no longer advising SNF. Patient strongly desires discharge home, will order full home health (pt/ot/rn/aide). Discharge reviewed with daughter, she has concerns about patient's ability to function at home but respects his decision to discharge home. We did offer to keep her and continue to pursue SNF but patient declines that. He has necessary DME.   Acute delirium resolved   Lobar pneumonia (HCC) Resolved s/p ceftriaxone /azithromycin , resolved   Acute hypoxemic respiratory failure (HCC) Initial pulse ox 88% on nonrebreather mask.  Patient on 15 L high flow nasal cannula for 2 days.   Now weaned to room air   Uncontrolled type 2 diabetes mellitus with hyperglycemia, with long-term current use of insulin  (HCC) CBG within goal, can resume home meds   AKI (acute kidney injury) (HCC) resolved   Malignant neoplasm of prostate (HCC) Follow-up as outpatient   Benign essential hypertension Bps soft with resumption of home losartan , will hold pending pcp f/u  Procedures: none   Consultations: none  Discharge Exam: Vitals:  10/14/23 0806 10/14/23 1011  BP: (!) 86/61 101/64  Pulse: 82 77  Resp: 18   Temp: 97.7 F (36.5 C)   SpO2: 95%     General: NAD Cardiovascular: RRR Respiratory: CTAB Ext: warm, no edema  Discharge Instructions   Discharge Instructions     Diet - low sodium heart healthy   Complete by: As directed    Diet - low sodium heart healthy   Complete by:  As directed    Increase activity slowly   Complete by: As directed    Increase activity slowly   Complete by: As directed       Allergies as of 10/14/2023       Reactions   Ace Inhibitors Other (See Comments)   Hyperkalemia.        Medication List     PAUSE taking these medications    losartan  100 MG tablet Wait to take this until your doctor or other care provider tells you to start again. Commonly known as: COZAAR  Take 100 mg by mouth daily.       TAKE these medications    aspirin  EC 81 MG tablet Take 1 tablet (81 mg total) by mouth daily. Swallow whole.   carvedilol  25 MG tablet Commonly known as: COREG  Take 0.5 tablets (12.5 mg total) by mouth 2 (two) times daily with a meal. What changed: how much to take   Cholecalciferol 25 MCG (1000 UT) tablet Take 1,000 Units by mouth daily.   diclofenac Sodium 1 % Gel Commonly known as: VOLTAREN Apply 2 g topically 2 (two) times daily.   empagliflozin  25 MG Tabs tablet Commonly known as: JARDIANCE  Take 12.5 mg by mouth every morning.   gabapentin  600 MG tablet Commonly known as: NEURONTIN  Take 1 tablet by mouth at bedtime.   isosorbide  mononitrate 30 MG 24 hr tablet Commonly known as: IMDUR  Take 30 mg by mouth daily.   lidocaine  5 % Commonly known as: LIDODERM  Place onto the skin.  APPLY 1 PATCH TO SKIN ONCE DAILY FOR BACK PAIN (APPLY FOR 12 HOURS, THEN REMOVE FOR 12 HOURS)   melatonin 3 MG Tabs tablet Take 3 mg by mouth at bedtime.   metFORMIN  500 MG 24 hr tablet Commonly known as: GLUCOPHAGE -XR Take 1,000 mg by mouth in the morning and at bedtime.   mirabegron  ER 25 MG Tb24 tablet Commonly known as: MYRBETRIQ  Take 25 mg by mouth daily.   MULTIVITAMIN ADULT PO Take 1 tablet by mouth daily.   NovoLOG  Mix 70/30 (70-30) 100 UNIT/ML injection Generic drug: insulin  aspart protamine - aspart Inject into the skin.  INJECT 60 UNITS OF SUBCUTANEOUSLY BEFORE BREAKFAST AND INJECT 50 UNITS BEFORE DINNER FOR  DIABETES FOR DIABETES   omeprazole 20 MG capsule Commonly known as: PRILOSEC Take 20 mg by mouth 2 (two) times daily.   rosuvastatin  40 MG tablet Commonly known as: CRESTOR  Take 1 tablet (40 mg total) by mouth daily. What changed: how much to take   Semaglutide  (2 MG/DOSE) 8 MG/3ML Sopn Inject into the skin.  INJECT 2MG  SUBCUTANEOUSLY EVERY 7 DAYS FOR DIABETES   tamsulosin  0.4 MG Caps capsule Commonly known as: FLOMAX  Take 0.4 mg by mouth at bedtime.       Allergies  Allergen Reactions   Ace Inhibitors Other (See Comments)    Hyperkalemia.    Contact information for follow-up providers     [facility] Follow up.   Specialty: General Practice Why: in 1-2 weeks for hospital follow-up Contact information: [id number] [place] [address] [phone]              Contact information for after-discharge care     Destination     [facility] .   Service: Skilled Nursing Contact information: [address] [place]   [address] [phone]                      The results of significant diagnostics from this hospitalization (including imaging, microbiology, ancillary and laboratory) are listed below for reference.    Significant Diagnostic Studies: CT HEAD WO CONTRAST ( ) Result Date: 09/29/2023 CLINICAL DATA:  Provided history: Ataxia, acute, traumatic. EXAM: CT HEAD WITHOUT CONTRAST CT CERVICAL SPINE WITHOUT CONTRAST TECHNIQUE: Multidetector CT imaging of the head and cervical spine was performed following the standard protocol without intravenous contrast. Multiplanar CT image reconstructions of the cervical spine were also generated. RADIATION DOSE REDUCTION: This exam was performed according to the departmental dose-optimization program which includes automated exposure control, adjustment of the mA and/or kV according to patient size and/or use of iterative reconstruction technique.  COMPARISON:  Head CT 06/02/2021.  Cervical spine CT 03/23/2023. FINDINGS: CT HEAD FINDINGS Brain: Generalized cerebral atrophy. Patchy and ill-defined hypoattenuation within the cerebral white matter, nonspecific but compatible with mild chronic small vessel ischemic disease. There is no acute intracranial hemorrhage. No demarcated cortical infarct. No extra-axial fluid collection. No evidence of an intracranial mass. No midline shift. Vascular: No hyperdense vessel.  Atherosclerotic calcifications. Skull: No calvarial fracture or aggressive osseous lesion. Sinuses/Orbits: No mass or acute finding within the imaged orbits. Moderate mucosal thickening within the left frontal sinus. Moderate left ethmoid sinusitis. Small fluid level, and trace background mucosal thickening, within the left sphenoid sinus. Complete opacification of the left maxillary sinus (with associated chronic reactive osteitis). Also of note, the left ostiomeatal unit is partially opacified. CT CERVICAL SPINE FINDINGS Alignment: Mild dextrocurvature of the cervical spine. Nonspecific straightening of the expected cervical lordosis. 2 mm C3-C4 grade 1 anterolisthesis. Skull base and vertebrae: The basion-dental and atlanto-dental intervals are maintained.No evidence of acute fracture to the cervical spine. Soft tissues and spinal canal: No prevertebral fluid or swelling. No visible canal hematoma. Disc levels: Cervical spondylosis with multilevel disc space narrowing, disc bulges/central disc protrusions, endplate spurring and uncovertebral hypertrophy. Disc space narrowing is greatest at C4-C5, C5-C6 and C6-C7 (advanced at these levels). No appreciable high-grade spinal canal stenosis. Multilevel bony neural foraminal narrowing. Multilevel ventral osteophytes. Upper chest: The lung apices are excluded from the field of view. IMPRESSION: CT head: 1.  No evidence of an acute intracranial abnormality. 2. Parenchymal atrophy and chronic small vessel  ischemic disease. 3. Paranasal sinus disease as described. Most notably, there is severe left maxillary sinus disease (with complete sinus opacification and partial opacification of the left ostiomeatal unit). ENT referral recommended. CT cervical spine: 1. No evidence of an acute cervical spine fracture. 2. Nonspecific straightening of the expected cervical lordosis. 3. Mild dextrocurvature of the cervical spine. 4. 2 mm C3-C4 grade 1 anterolisthesis. 5. Cervical spondylosis as described. Electronically Signed   By: Rockey Childs D.O.   On: 09/29/2023 16:43   CT CERVICAL SPINE WO CONTRAST Result Date: 09/29/2023 CLINICAL DATA:  Provided history: Ataxia, acute, traumatic. EXAM: CT HEAD WITHOUT CONTRAST CT CERVICAL SPINE WITHOUT CONTRAST TECHNIQUE: Multidetector CT imaging of the head and cervical spine was performed following the standard protocol without intravenous contrast. Multiplanar CT image reconstructions of the cervical spine  were also generated. RADIATION DOSE REDUCTION: This exam was performed according to the departmental dose-optimization program which includes automated exposure control, adjustment of the mA and/or kV according to patient size and/or use of iterative reconstruction technique. COMPARISON:  Head CT 06/02/2021.  Cervical spine CT 03/23/2023. FINDINGS: CT HEAD FINDINGS Brain: Generalized cerebral atrophy. Patchy and ill-defined hypoattenuation within the cerebral white matter, nonspecific but compatible with mild chronic small vessel ischemic disease. There is no acute intracranial hemorrhage. No demarcated cortical infarct. No extra-axial fluid collection. No evidence of an intracranial mass. No midline shift. Vascular: No hyperdense vessel.  Atherosclerotic calcifications. Skull: No calvarial fracture or aggressive osseous lesion. Sinuses/Orbits: No mass or acute finding within the imaged orbits. Moderate mucosal thickening within the left frontal sinus. Moderate left ethmoid  sinusitis. Small fluid level, and trace background mucosal thickening, within the left sphenoid sinus. Complete opacification of the left maxillary sinus (with associated chronic reactive osteitis). Also of note, the left ostiomeatal unit is partially opacified. CT CERVICAL SPINE FINDINGS Alignment: Mild dextrocurvature of the cervical spine. Nonspecific straightening of the expected cervical lordosis. 2 mm C3-C4 grade 1 anterolisthesis. Skull base and vertebrae: The basion-dental and atlanto-dental intervals are maintained.No evidence of acute fracture to the cervical spine. Soft tissues and spinal canal: No prevertebral fluid or swelling. No visible canal hematoma. Disc levels: Cervical spondylosis with multilevel disc space narrowing, disc bulges/central disc protrusions, endplate spurring and uncovertebral hypertrophy. Disc space narrowing is greatest at C4-C5, C5-C6 and C6-C7 (advanced at these levels). No appreciable high-grade spinal canal stenosis. Multilevel bony neural foraminal narrowing. Multilevel ventral osteophytes. Upper chest: The lung apices are excluded from the field of view. IMPRESSION: CT head: 1.  No evidence of an acute intracranial abnormality. 2. Parenchymal atrophy and chronic small vessel ischemic disease. 3. Paranasal sinus disease as described. Most notably, there is severe left maxillary sinus disease (with complete sinus opacification and partial opacification of the left ostiomeatal unit). ENT referral recommended. CT cervical spine: 1. No evidence of an acute cervical spine fracture. 2. Nonspecific straightening of the expected cervical lordosis. 3. Mild dextrocurvature of the cervical spine. 4. 2 mm C3-C4 grade 1 anterolisthesis. 5. Cervical spondylosis as described. Electronically Signed   By: Rockey Childs D.O.   On: 09/29/2023 16:43   DG Chest Port 1 View Result Date: 09/29/2023 CLINICAL DATA:  Sepsis. EXAM: PORTABLE CHEST 1 VIEW COMPARISON:  Jun 07, 2019. FINDINGS: Stable  cardiomegaly. Status post coronary artery bypass graft. Left lung is clear. New right perihilar and basilar opacity is noted concerning for pneumonia. Bony thorax is unremarkable. IMPRESSION: New right perihilar and basilar opacity is noted concerning for pneumonia. Followup PA and lateral chest X-ray is recommended in 3-4 weeks following trial of antibiotic therapy to ensure resolution and exclude underlying malignancy. Electronically Signed   By: Lynwood Landy Raddle M.D.   On: 09/29/2023 10:12    Microbiology: No results found for this or any previous visit (from the past 240 hours).   Labs: Basic Metabolic Panel: Recent Labs  Lab 10/13/23 0538  NA 140  K 4.1  CL 103  CO2 23  GLUCOSE 136*  BUN 25*  CREATININE 1.30*  CALCIUM  9.0   Liver Function Tests: No results for input(s): AST, ALT, ALKPHOS, BILITOT, PROT, ALBUMIN  in the last 168 hours. No results for input(s): LIPASE, AMYLASE in the last 168 hours. No results for input(s): AMMONIA in the last 168 hours. CBC: No results for input(s): WBC, NEUTROABS, HGB, HCT, MCV, PLT in the  last 168 hours. Cardiac Enzymes: No results for input(s): CKTOTAL, CKMB, CKMBINDEX, TROPONINI in the last 168 hours. BNP: BNP (last 3 results) No results for input(s): BNP in the last 8760 hours.  ProBNP (last 3 results) No results for input(s): PROBNP in the last 8760 hours.  CBG: Recent Labs  Lab 10/13/23 1159 10/13/23 1553 10/13/23 2013 10/14/23 0800 10/14/23 1206  GLUCAP 184* 203* 197* 124* 215*       Signed:  Devaughn KATHEE Ban MD.  [facility] 10/14/2023, 1:58 PM

## 2023-10-14 NOTE — Progress Notes (Signed)
 Physical Therapy Treatment Patient Details Name: Cristian Martin MRN: [id number] DOB: 1945-03-16 Today's Date: 10/14/2023   History of Present Illness Cristian Martin is a pleasant 78 y.o. male with medical history significant for CAD s/p CABG HFpEF, HLD, GERD, HTN, DM on insulin , anxiety who presented to ED after altered mental status, fever, nausea and vomiting. MD dx include: Acute hypoxemic respiratory failure likely due to PNA, congestive heart failure, HTN, diabetes, hypokalemia, and CAD.    PT Comments  Pt in recliner on entry, wife at bedside. Pt reports to feel generally good throughout. Pt partakes in sustained AMB effort, side stepping with support for balance and hip strengthening, and repeated STS from balance, coordination, and strengthening. No LOB noted in session, although author provides minGuardA given pt's insidious repeated falls at home. Pt showing improvements in general, but will continue to benefit from additional PT services.    If plan is discharge home, recommend the following: A little help with bathing/dressing/bathroom;Assistance with cooking/housework;Help with stairs or ramp for entrance   Can travel by private vehicle     Yes  Equipment Recommendations  None recommended by PT    Recommendations for Other Services       Precautions / Restrictions Precautions Precautions: Fall Recall of Precautions/Restrictions: Intact Restrictions Weight Bearing Restrictions Per Provider Order: No     Mobility  Bed Mobility                    Transfers Overall transfer level: Needs assistance Equipment used: Rolling walker (2 wheels) Transfers: Sit to/from Stand Sit to Stand: Supervision           General transfer comment: 12x from recliner in session without LOB    Ambulation/Gait Ambulation/Gait assistance: Contact guard assist Gait Distance (Feet): 120 Feet Assistive device: Rolling walker (2 wheels) Gait Pattern/deviations: WFL(Within  Functional Limits), Step-to pattern, Step-through pattern       General Gait Details: decline in gait speed when holding a conversation, but does not appear fatigued.   Stairs             Wheelchair Mobility     Tilt Bed    Modified Rankin (Stroke Patients Only)       Balance                                            Communication    Cognition                                        Cueing    Exercises Other Exercises Other Exercises: STS from recliner x10 Other Exercises: lateral side stepping with 2 hand support on rail 1x21ft bilat (no LOB)    General Comments        Pertinent Vitals/Pain Pain Assessment Pain Assessment: No/denies pain    Home Living                          Prior Function            PT Goals (current goals can now be found in the care plan section) Acute Rehab PT Goals Patient Stated Goal: to get stronger PT Goal Formulation: With patient Time For Goal Achievement: 10/14/23 Potential to Achieve Goals: Good  Progress towards PT goals: Progressing toward goals    Frequency    Min 2X/week      PT Plan      Co-evaluation              AM-PAC PT 6 Clicks Mobility   Outcome Measure  Help needed turning from your back to your side while in a flat bed without using bedrails?: A Little Help needed moving from lying on your back to sitting on the side of a flat bed without using bedrails?: A Little Help needed moving to and from a bed to a chair (including a wheelchair)?: A Little Help needed standing up from a chair using your arms (e.g., wheelchair or bedside chair)?: A Little Help needed to walk in hospital room?: A Little Help needed climbing 3-5 steps with a railing? : A Little 6 Click Score: 18    End of Session Equipment Utilized During Treatment: Gait belt Activity Tolerance: Patient tolerated treatment well;No increased pain Patient left: in chair;with call  bell/phone within reach;with chair alarm set;with family/visitor present Nurse Communication: Mobility status PT Visit Diagnosis: Unsteadiness on feet (R26.81);Muscle weakness (generalized) (M62.81);History of falling (Z91.81);Difficulty in walking, not elsewhere classified (R26.2)     Time: 8988-8967 PT Time Calculation (min) (ACUTE ONLY): 21 min  Charges:    $Therapeutic Activity: 8-22 mins PT General Charges $$ ACUTE PT VISIT: 1 Visit                    11:07 AM, 10/14/23 Peggye JAYSON Linear, PT, DPT Physical Therapist - [facility]  [phone] (ASCOM)    Aadith Raudenbush C 10/14/2023, 11:05 AM

## 2023-11-04 IMAGING — CT CT HEAD W/O CM
4 series · 16 of 47 positions shown, 18 images · non-contrast
Comparison: None Available.

CLINICAL DATA: Fell, hit head



[Series 2: head wo · axial · 0.44mm/px · z∈[-51,+64]mm · 7 of 31 slices shown, 9 images]
[im 4/31  brain]
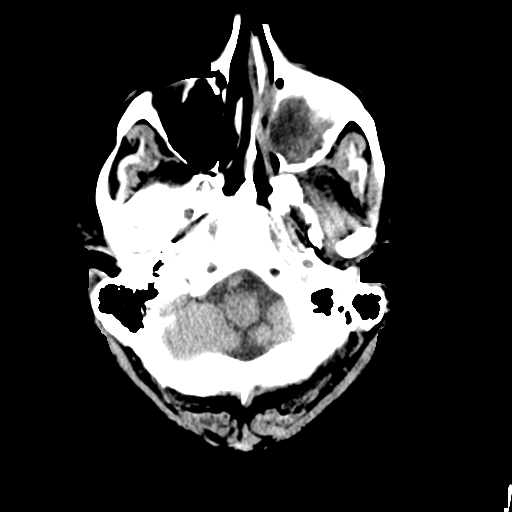
[im 4/31  bone]
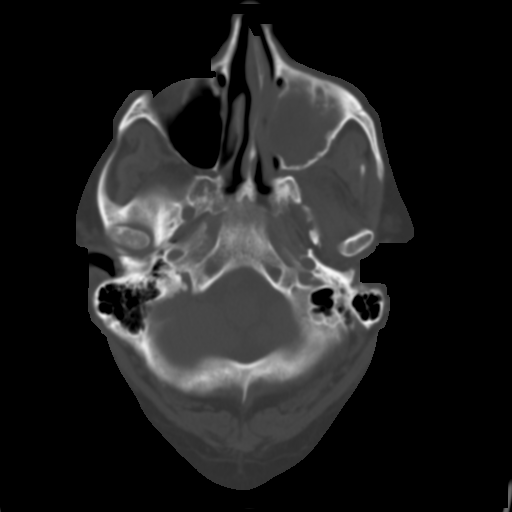
[im 8/31  brain]
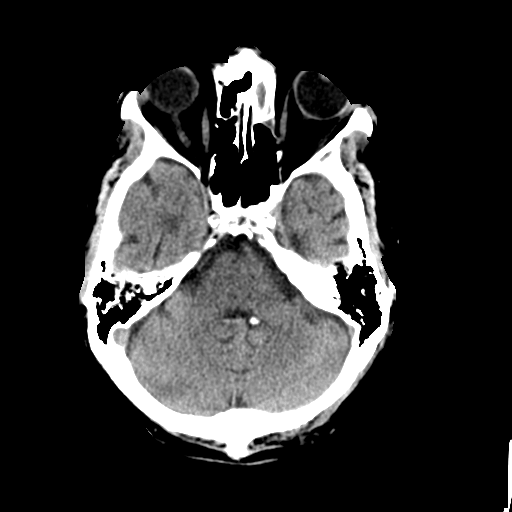
[im 12/31  brain]
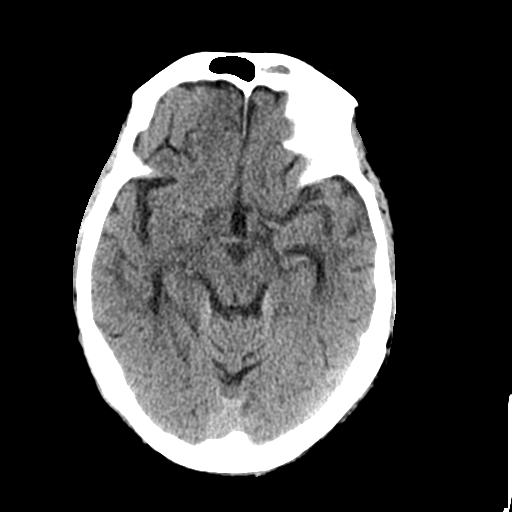
[im 16/31  brain]
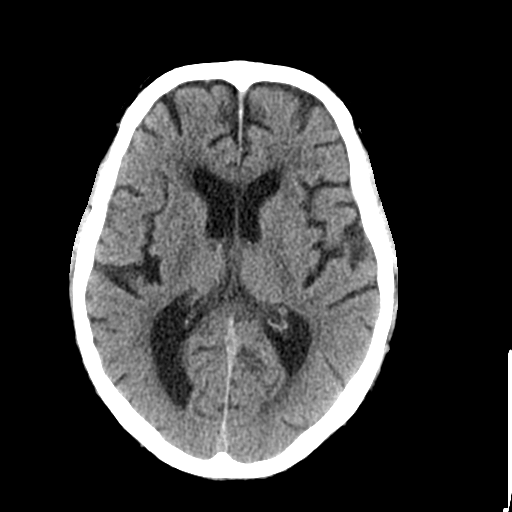
[im 19/31  brain]
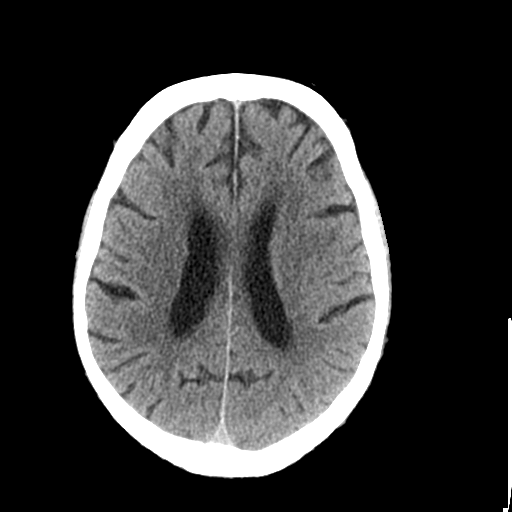
[im 19/31  bone]
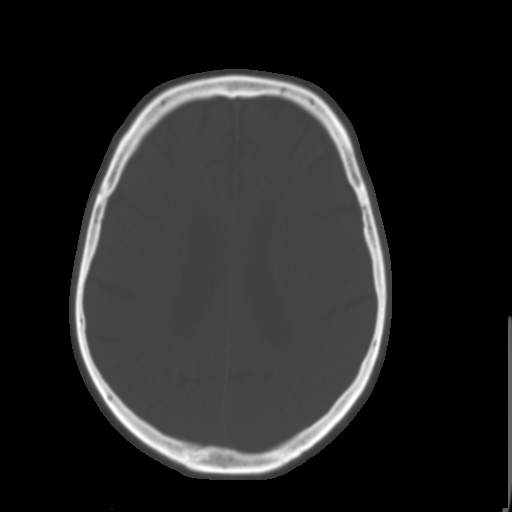
[im 23/31  brain]
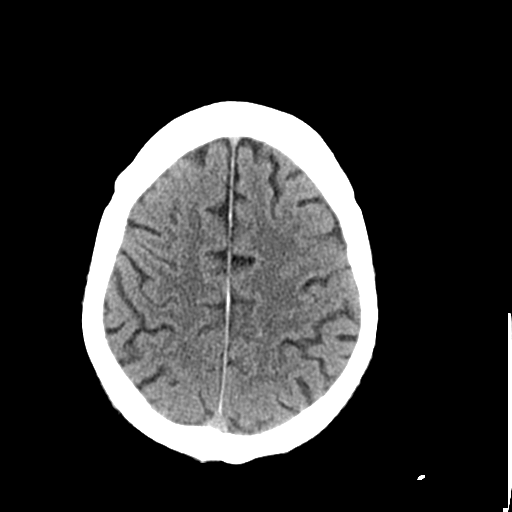
[im 27/31  brain]
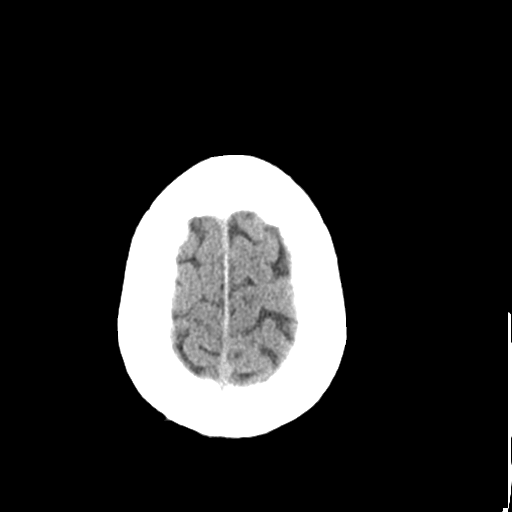

[Series 3: head bone · axial · 0.44mm/px · z∈[-52,-22]mm · 3 of 76 slices shown]
[im 8/76  bone]
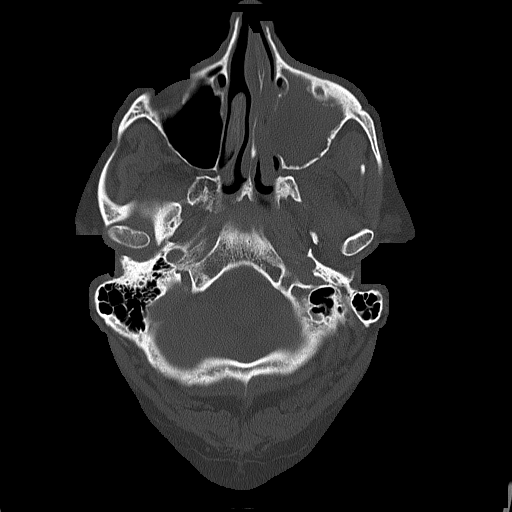
[im 16/76  bone]
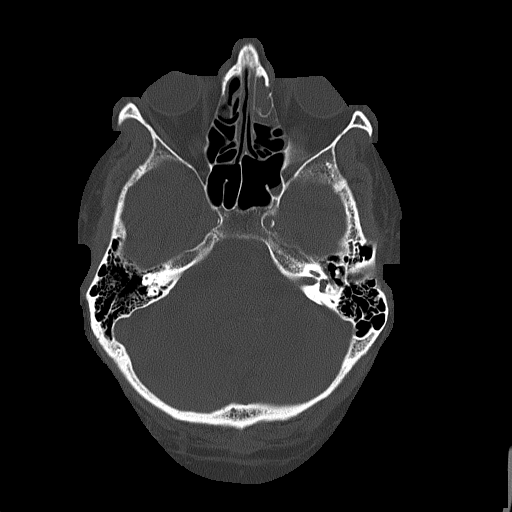
[im 23/76  bone]
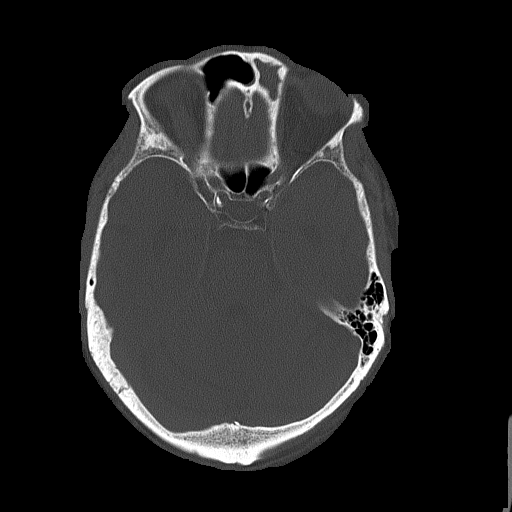

[Series 4: coronal soft tissue · coronal · 0.29mm/px · 3 of 65 slices shown]
[im 22/65  brain]
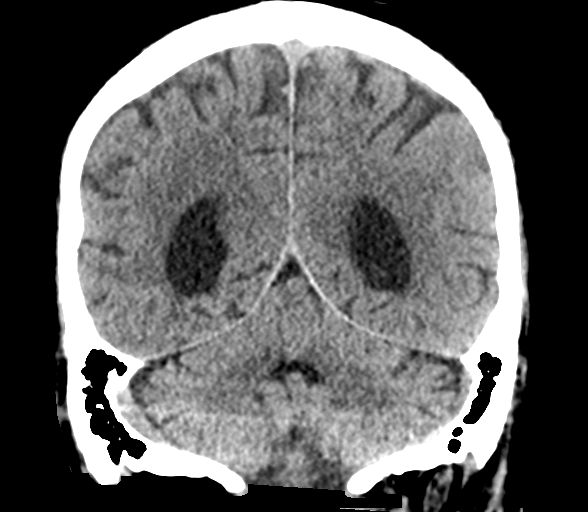
[im 29/65  brain]
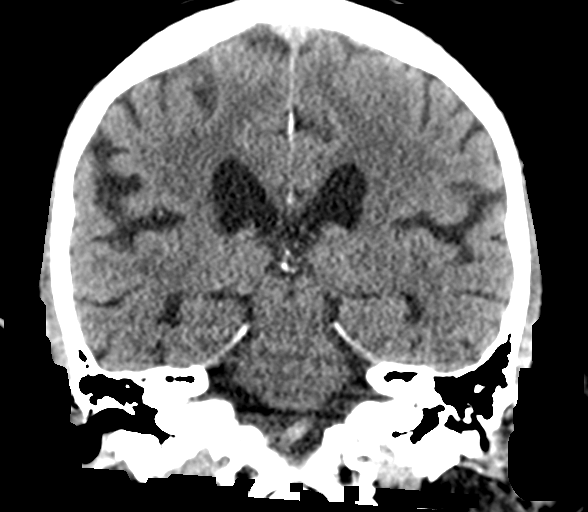
[im 36/65  brain]
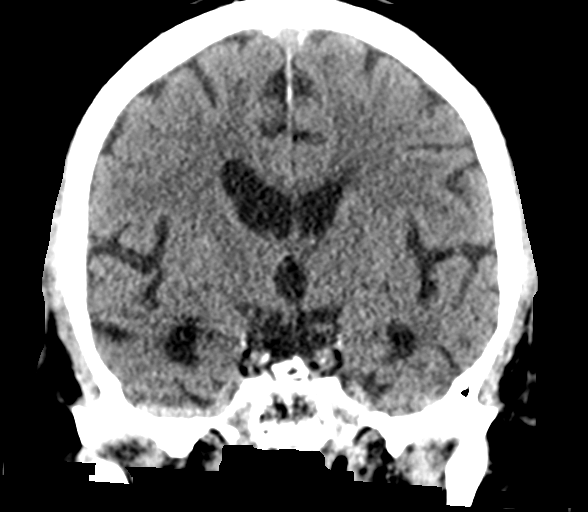

[Series 5: sagittal soft tissue · sagittal · 0.29mm/px · 3 of 51 slices shown]
[im 17/51  brain]
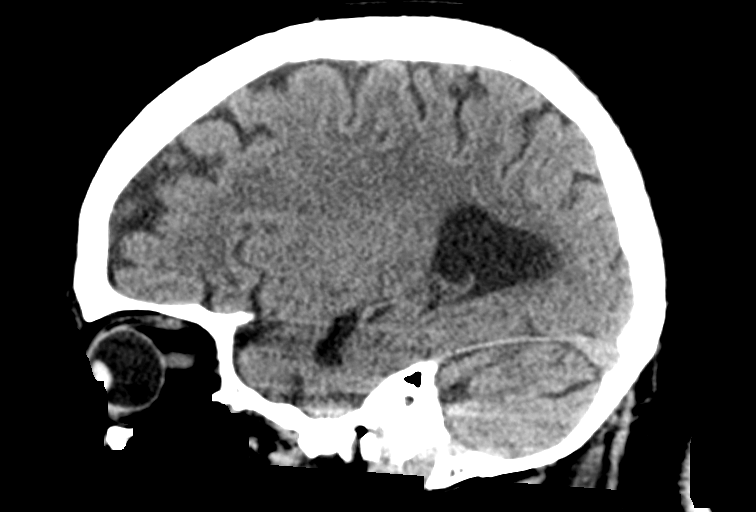
[im 26/51  brain]
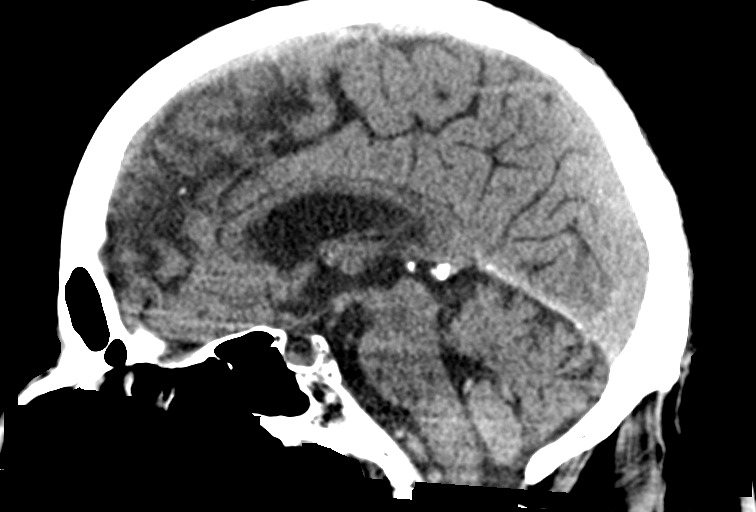
[im 34/51  brain]
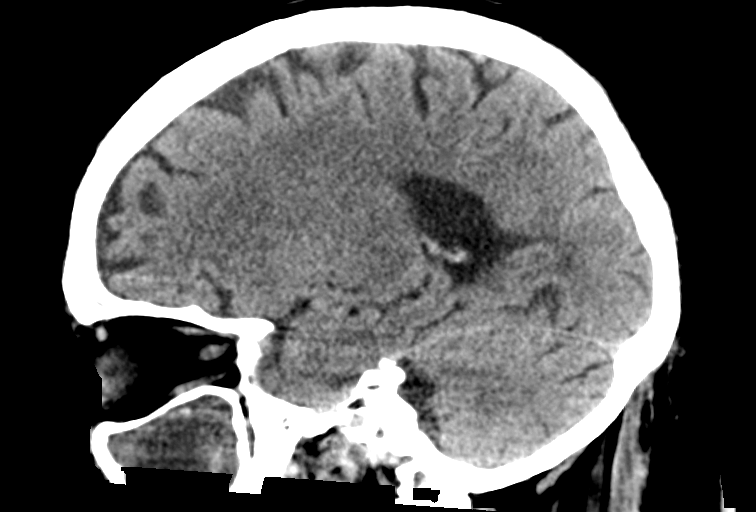

[16 of 47 positions shown; findings below may reference images not displayed]

FINDINGS: Brain: No acute infarct or hemorrhage. Lateral ventricles and
midline structures are unremarkable. No acute extra-axial fluid
collections. No mass effect.

Vascular: No hyperdense vessel or unexpected calcification.

Skull: Normal. Negative for fracture or focal lesion.

Sinuses/Orbits: Opacification of the left maxillary sinus, anterior
left ethmoid air cells, left frontal sinus.

Other: None.
IMPRESSION: 1. No acute intracranial process.
2. Opacification of the left frontal, anterior left ethmoid, and
left maxillary sinuses.
